# Patient Record
Sex: Female | Born: 1955 | Race: White | Hispanic: No | Marital: Married | Smoking: Former smoker
Health system: Southern US, Community
[De-identification: ages and names within clinical notes are randomized; demographics above are authoritative.]

## PROBLEM LIST (undated history)

## (undated) DIAGNOSIS — D689 Coagulation defect, unspecified: Secondary | ICD-10-CM

## (undated) DIAGNOSIS — C801 Malignant (primary) neoplasm, unspecified: Secondary | ICD-10-CM

## (undated) DIAGNOSIS — R570 Cardiogenic shock: Secondary | ICD-10-CM

## (undated) DIAGNOSIS — K219 Gastro-esophageal reflux disease without esophagitis: Secondary | ICD-10-CM

## (undated) DIAGNOSIS — I10 Essential (primary) hypertension: Secondary | ICD-10-CM

## (undated) DIAGNOSIS — J189 Pneumonia, unspecified organism: Secondary | ICD-10-CM

## (undated) DIAGNOSIS — Z8349 Family history of other endocrine, nutritional and metabolic diseases: Secondary | ICD-10-CM

## (undated) DIAGNOSIS — I2510 Atherosclerotic heart disease of native coronary artery without angina pectoris: Secondary | ICD-10-CM

## (undated) DIAGNOSIS — E785 Hyperlipidemia, unspecified: Secondary | ICD-10-CM

## (undated) DIAGNOSIS — R0600 Dyspnea, unspecified: Secondary | ICD-10-CM

## (undated) DIAGNOSIS — Z8674 Personal history of sudden cardiac arrest: Secondary | ICD-10-CM

## (undated) DIAGNOSIS — Z803 Family history of malignant neoplasm of breast: Secondary | ICD-10-CM

## (undated) DIAGNOSIS — I219 Acute myocardial infarction, unspecified: Secondary | ICD-10-CM

## (undated) HISTORY — DX: Family history of other endocrine, nutritional and metabolic diseases: Z83.49

## (undated) HISTORY — DX: Family history of malignant neoplasm of breast: Z80.3

## (undated) HISTORY — PX: OTHER SURGICAL HISTORY: SHX169

## (undated) HISTORY — DX: Personal history of sudden cardiac arrest: Z86.74

## (undated) HISTORY — DX: Acute myocardial infarction, unspecified: I21.9

## (undated) HISTORY — DX: Hyperlipidemia, unspecified: E78.5

## (undated) HISTORY — PX: EXPLORATORY LAPAROTOMY: SUR591

## (undated) HISTORY — DX: Coagulation defect, unspecified: D68.9

## (undated) HISTORY — DX: Atherosclerotic heart disease of native coronary artery without angina pectoris: I25.10

## (undated) HISTORY — DX: Essential (primary) hypertension: I10

## (undated) HISTORY — DX: Cardiogenic shock: R57.0

---

## 1978-11-30 HISTORY — PX: HERNIA REPAIR: SHX51

## 1985-11-30 HISTORY — PX: ABDOMINAL HYSTERECTOMY: SHX81

## 1985-11-30 HISTORY — PX: APPENDECTOMY: SHX54

## 1985-11-30 LAB — CONVERTED CEMR LAB

## 2003-09-27 ENCOUNTER — Ambulatory Visit (HOSPITAL_COMMUNITY): Admission: RE | Admit: 2003-09-27 | Discharge: 2003-09-27 | Payer: Self-pay | Admitting: Internal Medicine

## 2005-01-13 ENCOUNTER — Encounter: Admission: RE | Admit: 2005-01-13 | Discharge: 2005-01-13 | Payer: Self-pay | Admitting: Internal Medicine

## 2006-06-15 ENCOUNTER — Encounter: Admission: RE | Admit: 2006-06-15 | Discharge: 2006-06-15 | Payer: Self-pay | Admitting: Family Medicine

## 2006-06-24 ENCOUNTER — Encounter: Admission: RE | Admit: 2006-06-24 | Discharge: 2006-06-24 | Payer: Self-pay | Admitting: Family Medicine

## 2008-03-13 ENCOUNTER — Encounter: Admission: RE | Admit: 2008-03-13 | Discharge: 2008-03-13 | Payer: Self-pay | Admitting: Family Medicine

## 2009-01-16 ENCOUNTER — Encounter: Payer: Self-pay | Admitting: Family Medicine

## 2009-08-09 ENCOUNTER — Encounter: Admission: RE | Admit: 2009-08-09 | Discharge: 2009-08-09 | Payer: Self-pay | Admitting: Family Medicine

## 2009-11-30 DIAGNOSIS — I2510 Atherosclerotic heart disease of native coronary artery without angina pectoris: Secondary | ICD-10-CM

## 2009-11-30 HISTORY — DX: Atherosclerotic heart disease of native coronary artery without angina pectoris: I25.10

## 2010-01-28 DIAGNOSIS — R570 Cardiogenic shock: Secondary | ICD-10-CM

## 2010-01-28 HISTORY — DX: Cardiogenic shock: R57.0

## 2010-02-15 ENCOUNTER — Inpatient Hospital Stay (HOSPITAL_COMMUNITY): Admission: EM | Admit: 2010-02-15 | Discharge: 2010-03-04 | Payer: Self-pay | Admitting: Emergency Medicine

## 2010-02-15 ENCOUNTER — Ambulatory Visit: Payer: Self-pay | Admitting: Emergency Medicine

## 2010-02-15 ENCOUNTER — Ambulatory Visit: Payer: Self-pay | Admitting: Internal Medicine

## 2010-02-15 DIAGNOSIS — I219 Acute myocardial infarction, unspecified: Secondary | ICD-10-CM

## 2010-02-15 DIAGNOSIS — Z8674 Personal history of sudden cardiac arrest: Secondary | ICD-10-CM

## 2010-02-15 HISTORY — DX: Personal history of sudden cardiac arrest: Z86.74

## 2010-02-15 HISTORY — PX: CORONARY ANGIOPLASTY WITH STENT PLACEMENT: SHX49

## 2010-02-15 HISTORY — DX: Acute myocardial infarction, unspecified: I21.9

## 2010-02-16 ENCOUNTER — Encounter (INDEPENDENT_AMBULATORY_CARE_PROVIDER_SITE_OTHER): Payer: Self-pay | Admitting: Cardiovascular Disease

## 2010-02-27 ENCOUNTER — Ambulatory Visit: Payer: Self-pay | Admitting: Physical Medicine & Rehabilitation

## 2010-03-11 ENCOUNTER — Encounter: Admission: RE | Admit: 2010-03-11 | Discharge: 2010-03-14 | Payer: Self-pay | Admitting: Cardiovascular Disease

## 2010-03-20 ENCOUNTER — Encounter (HOSPITAL_COMMUNITY): Admission: RE | Admit: 2010-03-20 | Discharge: 2010-06-18 | Payer: Self-pay | Admitting: Cardiovascular Disease

## 2010-05-06 HISTORY — PX: DOPPLER ECHOCARDIOGRAPHY: SHX263

## 2010-05-06 HISTORY — PX: NM MYOCAR PERF WALL MOTION: HXRAD629

## 2010-06-19 ENCOUNTER — Ambulatory Visit: Payer: Self-pay | Admitting: Family Medicine

## 2010-06-19 DIAGNOSIS — E785 Hyperlipidemia, unspecified: Secondary | ICD-10-CM | POA: Insufficient documentation

## 2010-06-19 DIAGNOSIS — I10 Essential (primary) hypertension: Secondary | ICD-10-CM | POA: Insufficient documentation

## 2010-06-19 DIAGNOSIS — I252 Old myocardial infarction: Secondary | ICD-10-CM | POA: Insufficient documentation

## 2010-06-20 LAB — CONVERTED CEMR LAB
BUN: 16 mg/dL (ref 6–23)
Basophils Absolute: 0 10*3/uL (ref 0.0–0.1)
Basophils Relative: 0.5 % (ref 0.0–3.0)
CO2: 28 meq/L (ref 19–32)
Calcium: 9.7 mg/dL (ref 8.4–10.5)
Chloride: 108 meq/L (ref 96–112)
Creatinine, Ser: 0.7 mg/dL (ref 0.4–1.2)
Eosinophils Absolute: 0.1 10*3/uL (ref 0.0–0.7)
Eosinophils Relative: 2 % (ref 0.0–5.0)
GFR calc non Af Amer: 86.34 mL/min (ref >60)
Glucose, Bld: 81 mg/dL (ref 70–99)
HCT: 40.4 % (ref 36.0–46.0)
Hemoglobin: 13.9 g/dL (ref 12.0–15.0)
Lymphocytes Relative: 27.3 % (ref 12.0–46.0)
Lymphs Abs: 1.9 10*3/uL (ref 0.7–4.0)
MCHC: 34.4 g/dL (ref 30.0–36.0)
MCV: 91.1 fL (ref 78.0–100.0)
Monocytes Absolute: 0.7 10*3/uL (ref 0.1–1.0)
Monocytes Relative: 9.3 % (ref 3.0–12.0)
Neutro Abs: 4.3 10*3/uL (ref 1.4–7.7)
Neutrophils Relative %: 60.9 % (ref 43.0–77.0)
Platelets: 293 10*3/uL (ref 150.0–400.0)
Potassium: 4.5 meq/L (ref 3.5–5.1)
RBC: 4.44 M/uL (ref 3.87–5.11)
RDW: 14.4 % (ref 11.5–14.6)
Sodium: 142 meq/L (ref 135–145)
TSH: 1.52 microintl units/mL (ref 0.35–5.50)
WBC: 7 10*3/uL (ref 4.5–10.5)

## 2010-06-24 ENCOUNTER — Encounter: Payer: Self-pay | Admitting: Family Medicine

## 2010-07-03 ENCOUNTER — Encounter: Payer: Self-pay | Admitting: Family Medicine

## 2010-07-16 ENCOUNTER — Telehealth (INDEPENDENT_AMBULATORY_CARE_PROVIDER_SITE_OTHER): Payer: Self-pay | Admitting: *Deleted

## 2010-07-21 ENCOUNTER — Ambulatory Visit: Payer: Self-pay | Admitting: Family Medicine

## 2010-07-21 DIAGNOSIS — J0110 Acute frontal sinusitis, unspecified: Secondary | ICD-10-CM | POA: Insufficient documentation

## 2010-08-06 ENCOUNTER — Ambulatory Visit: Payer: Self-pay | Admitting: Family Medicine

## 2010-08-06 DIAGNOSIS — L723 Sebaceous cyst: Secondary | ICD-10-CM | POA: Insufficient documentation

## 2010-08-06 DIAGNOSIS — R002 Palpitations: Secondary | ICD-10-CM | POA: Insufficient documentation

## 2010-08-06 DIAGNOSIS — H6990 Unspecified Eustachian tube disorder, unspecified ear: Secondary | ICD-10-CM | POA: Insufficient documentation

## 2010-08-06 DIAGNOSIS — H6980 Other specified disorders of Eustachian tube, unspecified ear: Secondary | ICD-10-CM | POA: Insufficient documentation

## 2010-08-07 ENCOUNTER — Encounter: Payer: Self-pay | Admitting: Family Medicine

## 2010-08-11 ENCOUNTER — Encounter: Admission: RE | Admit: 2010-08-11 | Discharge: 2010-08-11 | Payer: Self-pay | Admitting: Family Medicine

## 2010-08-11 LAB — HM MAMMOGRAPHY: HM Mammogram: NEGATIVE

## 2010-09-16 ENCOUNTER — Encounter: Payer: Self-pay | Admitting: Family Medicine

## 2010-10-15 ENCOUNTER — Telehealth (INDEPENDENT_AMBULATORY_CARE_PROVIDER_SITE_OTHER): Payer: Self-pay | Admitting: *Deleted

## 2010-11-03 ENCOUNTER — Encounter (INDEPENDENT_AMBULATORY_CARE_PROVIDER_SITE_OTHER): Payer: Self-pay | Admitting: *Deleted

## 2010-11-03 LAB — CONVERTED CEMR LAB
ALT: 18 units/L
AST: 25 units/L
Albumin: 4.1 g/dL
Alkaline Phosphatase: 58 units/L
BUN: 15 mg/dL
CO2, serum: 26 mmol/L
Calcium: 9.2 mg/dL
Chloride, Serum: 107 mmol/L
Cholesterol: 179 mg/dL
Creatinine, Ser: 0.81 mg/dL
Glucose, Bld: 89 mg/dL
HDL: 52 mg/dL
LDL Cholesterol: 108 mg/dL
Potassium, serum: 4.3 mmol/L
Sodium, serum: 141 mmol/L
Total Bilirubin: 0.5 mg/dL
Total Protein: 6.5 g/dL
Triglycerides: 94 mg/dL

## 2010-11-27 ENCOUNTER — Encounter (INDEPENDENT_AMBULATORY_CARE_PROVIDER_SITE_OTHER): Payer: Self-pay | Admitting: *Deleted

## 2010-11-28 ENCOUNTER — Ambulatory Visit: Payer: Self-pay | Admitting: Family Medicine

## 2010-11-28 ENCOUNTER — Encounter: Payer: Self-pay | Admitting: Family Medicine

## 2010-11-28 LAB — CONVERTED CEMR LAB
Basophils Absolute: 0 10*3/uL (ref 0.0–0.1)
Basophils Relative: 0.4 % (ref 0.0–3.0)
Eosinophils Absolute: 0.1 10*3/uL (ref 0.0–0.7)
Eosinophils Relative: 2.4 % (ref 0.0–5.0)
HCT: 38.4 % (ref 36.0–46.0)
Hemoglobin: 13.1 g/dL (ref 12.0–15.0)
Lymphocytes Relative: 32.4 % (ref 12.0–46.0)
Lymphs Abs: 1.8 10*3/uL (ref 0.7–4.0)
MCHC: 34.2 g/dL (ref 30.0–36.0)
MCV: 92.5 fL (ref 78.0–100.0)
Monocytes Absolute: 0.5 10*3/uL (ref 0.1–1.0)
Monocytes Relative: 8.6 % (ref 3.0–12.0)
Neutro Abs: 3.2 10*3/uL (ref 1.4–7.7)
Neutrophils Relative %: 56.2 % (ref 43.0–77.0)
Platelets: 232 10*3/uL (ref 150.0–400.0)
RBC: 4.15 M/uL (ref 3.87–5.11)
RDW: 13.7 % (ref 11.5–14.6)
TSH: 1.04 microintl units/mL (ref 0.35–5.50)
WBC: 5.6 10*3/uL (ref 4.5–10.5)

## 2010-12-01 LAB — CONVERTED CEMR LAB: Vit D, 25-Hydroxy: 38 ng/mL (ref 30–89)

## 2010-12-21 ENCOUNTER — Encounter: Payer: Self-pay | Admitting: Family Medicine

## 2010-12-30 NOTE — Assessment & Plan Note (Signed)
Summary: FOLLOWUP/KN  Flu Vaccine Consent Questions     Do you have a history of severe allergic reactions to this vaccine? no    Any prior history of allergic reactions to egg and/or gelatin? no    Do you have a sensitivity to the preservative Thimersol? no    Do you have a past history of Guillan-Barre Syndrome? no    Do you currently have an acute febrile illness? no    Have you ever had a severe reaction to latex? no    Vaccine information given and explained to patient? yes    Are you currently pregnant? no    Lot Number:[id number]   Exp Date:05/29/2010   Site Given  Right Deltoid IM    Vital Signs:  Patient profile:   55 year old female Weight:      148 pounds Pulse rate:   64 / minute BP sitting:   110 / 60  (left arm)  Vitals Entered By: Doristine Devoid CMA (August 06, 2010 2:38 PM) CC: f/u    History of Present Illness: 55 yo woman here today for f/u.    1) diarrhea- had appt w/ Dr Evette Cristal.  he would not do colonoscopy due to pt's plavix use.  recommended pt have 1 year f/u w/ cards and get clearance to stop plavix for 2 days.  sxs have stopped since stopping amiodarone.  2) MI- Dr Allyson Sabal stopped pt's amiodarone due to GI intolerance.  3) palpitations- pt feels in the last 2 weeks heart is beating 'erratically'.  reports she had this prior to MI, sxs have recurred since stopping amiodarone.  asymptomatic today in office.  4) R ear pain- pain posterior to ear.  has persisted since mid August.  was tx'd for sinus infxn.  ear is improving but still uncomfortable.  no fevers.  no drainage.  5) sebaceous cyst- on L posterior neck.  not painful, not draining.  husband wants it removed.  Current Medications (verified): 1)  Klor-Con M20 20 Meq Cr-Tabs (Potassium Chloride Crys Cr) .Marland Kitchen.. 1 By Mouth Qd 2)  Lisinopril 10 Mg Tabs (Lisinopril) .Marland Kitchen.. 1 By Mouth Qd 3)  Metoprolol Tartrate 50 Mg Tabs (Metoprolol Tartrate) .Marland Kitchen.. 1 By Mouth Bid 4)  Pantoprazole Sodium 40 Mg Tbec  (Pantoprazole Sodium) .Marland Kitchen.. 1 By Mouth Bid 5)  Plavix 75 Mg Tabs (Clopidogrel Bisulfate) .Marland Kitchen.. 1 By Mouth Qd 6)  Aspirin 81 Mg Tbec (Aspirin) .... 2 By Mouth Qd 7)  Nitro-Dur 0.4 Mg/hr Pt24 (Nitroglycerin) .... Prn 8)  Ambien 10 Mg Tabs (Zolpidem Tartrate) .Marland Kitchen.. 1 By Mouth At Bedtime As Needed 9)  Nasonex 50 Mcg/act Susp (Mometasone Furoate) .... 2 Sprays Each Nostril Once Daily  Allergies (verified): No Known Drug Allergies  Past History:  Past Medical History: Last updated: 06/19/2010 Myocardial infarction, hx of (02-15-10) Hyperlipidemia Hypertension  Past Surgical History: Last updated: 06/19/2010 Appendectomy--1987 double bilateral herniorrhaphy--1980 Hysterectomy, BSO-- 1987 Laparotomy-exploratory  Review of Systems      See HPI  Physical Exam  General:  Well-developed,well-nourished,in no acute distress; alert,appropriate and cooperative throughout examination Head:  NCAT, no TTP over sinuses Eyes:  no injxn or inflammation Ears:  TMs retracted bilaterally Nose:  + congestion and turbinate edema Mouth:  + PND Neck:  No deformities, masses, or tenderness noted. Lungs:  Normal respiratory effort, chest expands symmetrically. Lungs are clear to auscultation, no crackles or wheezes. Heart:  Normal rate and regular rhythm. S1 and S2 normal without gallop, murmur, click, rub or other extra  sounds. Abdomen:  soft, NT/ND, +BS Pulses:  +2 carotid, radial, DP Extremities:  No clubbing, cyanosis, edema, or deformity noted Skin:  1.5 cm sebaceous cyst on R post neck   Impression & Recommendations:  Problem # 1:  DIARRHEA (ICD-787.91) Assessment Improved pt's sxs resolved since stopping amiodarone.  colonoscopy on hold due to pt's plavix use.  Problem # 2:  PALPITATIONS (ICD-785.1) Assessment: New no obvious abnormality on EKG- PVCs noted which may be responsible for pt's sxs.  encouraged her to discuss this w/ cards. Her updated medication list for this problem  includes:    Metoprolol Tartrate 50 Mg Tabs (Metoprolol tartrate) .Marland Kitchen... 1 by mouth bid  Problem # 3:  SEBACEOUS CYST, NECK (ICD-706.2) Assessment: New will hold off on removal due to pt's plavix use.  asymptomatic.  Problem # 4:  EUSTACHIAN TUBE DYSFUNCTION (ICD-381.81) Assessment: New pt's ear sxs most likely due to eustachian tube dysfxn.  start nasal steroid spray to decrease congestion.  odd OTC antihistamine.  reviewed supportive care and red flags that should prompt return.  Pt expresses understanding and is in agreement w/ this plan.  Complete Medication List: 1)  Klor-con M20 20 Meq Cr-tabs (Potassium chloride crys cr) .Marland Kitchen.. 1 by mouth qd 2)  Lisinopril 10 Mg Tabs (Lisinopril) .Marland Kitchen.. 1 by mouth qd 3)  Metoprolol Tartrate 50 Mg Tabs (Metoprolol tartrate) .Marland Kitchen.. 1 by mouth bid 4)  Pantoprazole Sodium 40 Mg Tbec (Pantoprazole sodium) .Marland Kitchen.. 1 by mouth bid 5)  Plavix 75 Mg Tabs (Clopidogrel bisulfate) .Marland Kitchen.. 1 by mouth qd 6)  Aspirin 81 Mg Tbec (Aspirin) .... 2 by mouth qd 7)  Nitro-dur 0.4 Mg/hr Pt24 (Nitroglycerin) .... Prn 8)  Ambien 10 Mg Tabs (Zolpidem tartrate) .Marland Kitchen.. 1 by mouth at bedtime as needed 9)  Nasonex 50 Mcg/act Susp (Mometasone furoate) .... 2 sprays each nostril once daily  Other Orders: EKG w/ Interpretation (93000) Admin 1st Vaccine (18841) Flu Vaccine 74yrs + (66063)  Patient Instructions: 1)  Follow up in 6 months for your complete physical- sooner if needed 2)  We'll hold on the sebaceous cyst until you've stopped or can hold the Plavix 3)  Start the Nasonex as directed to decrease nasal congestion and ear pain 4)  Add OTC Claritin or Zyrtec for seasonal allergy component 5)  Please call Dr Allyson Sabal and let him know about the erratic heart beat 6)  I'm so glad the diarrhea is gone! 7)  Hang in there! Prescriptions: NASONEX 50 MCG/ACT SUSP (MOMETASONE FUROATE) 2 sprays each nostril once daily  #1 x 3   Entered and Authorized by:   Neena Rhymes MD   Signed by:    Neena Rhymes MD on 08/06/2010   Method used:   Electronically to        [facility] [address] #[id number]* (retail)       [address]       [place], [place]  [address]       Ph: [phone]       Fax: [phone]   RxID:   [id number]

## 2010-12-30 NOTE — Letter (Signed)
Summary: [facility]  [facility]   Imported By: Lanelle Bal 08/18/2010 10:00:16  _____________________________________________________________________  External Attachment:    Type:   Image     Comment:   External Document

## 2010-12-30 NOTE — Assessment & Plan Note (Signed)
Summary: earache - cbs   Vital Signs:  Patient profile:   55 year old female Weight:      150 pounds Temp:     98.3 degrees F oral BP sitting:   128 / 88  (left arm)  Vitals Entered By: Almeta Monas CMA Duncan Dull) (July 21, 2010 4:20 PM) CC: c/o right ear pain and nasal drainage x 5 days, URI symptoms   History of Present Illness:       This is a 55 year old woman who presents with URI symptoms.  The symptoms began 5 days ago.  Symptoms started with ST on Thursday.  Pt states it then moved to sinuses and then to chest.  Pt went to UC--on 68.  The patient denies nasal congestion, clear nasal discharge, purulent nasal discharge, sore throat, dry cough, productive cough, earache, and sick contacts.  The patient denies fever, low-grade fever (<100.5 degrees), fever of 100.5-103 degrees, fever of 103.1-104 degrees, fever to >104 degrees, stiff neck, dyspnea, wheezing, rash, vomiting, diarrhea, use of an antipyretic, and response to antipyretic.  The patient also reports headache.  The patient denies itchy watery eyes, itchy throat, sneezing, seasonal symptoms, response to antihistamine, muscle aches, and severe fatigue.  The patient denies the following risk factors for Strep sinusitis: unilateral facial pain, unilateral nasal discharge, poor response to decongestant, double sickening, tooth pain, Strep exposure, tender adenopathy, and absence of cough.    Current Medications (verified): 1)  Amiodarone Hcl 200 Mg Tabs (Amiodarone Hcl) .Marland Kitchen.. 1 By Mouth Qd 2)  Klor-Con M20 20 Meq Cr-Tabs (Potassium Chloride Crys Cr) .Marland Kitchen.. 1 By Mouth Qd 3)  Lisinopril 10 Mg Tabs (Lisinopril) .Marland Kitchen.. 1 By Mouth Qd 4)  Metoprolol Tartrate 50 Mg Tabs (Metoprolol Tartrate) .Marland Kitchen.. 1 By Mouth Bid 5)  Pantoprazole Sodium 40 Mg Tbec (Pantoprazole Sodium) .Marland Kitchen.. 1 By Mouth Bid 6)  Plavix 75 Mg Tabs (Clopidogrel Bisulfate) .Marland Kitchen.. 1 By Mouth Qd 7)  Aspirin 81 Mg Tbec (Aspirin) .... 2 By Mouth Qd 8)  Nitro-Dur 0.4 Mg/hr Pt24  (Nitroglycerin) .... Prn 9)  Crestor 5mg  .... 1 By Mouth Every Other Day 10)  Ceftin 500 Mg Tabs (Cefuroxime Axetil) .Marland Kitchen.. 1 By Mouth Two Times A Day 11)  Prednisone 10 Mg Tabs (Prednisone) .... 3 By Mouth Once Daily For 3 Days Then 2 By Mouth Once Daily For 3 Days Then 1 By Mouth Once Daily For 3 Days 12)  Ambien 10 Mg Tabs (Zolpidem Tartrate) .Marland Kitchen.. 1 By Mouth At Bedtime As Needed  Allergies (verified): No Known Drug Allergies  Past History:  Past medical, surgical, family and social histories (including risk factors) reviewed for relevance to current acute and chronic problems.  Past Medical History: Reviewed history from 06/19/2010 and no changes required. Myocardial infarction, hx of (02-15-10) Hyperlipidemia Hypertension  Past Surgical History: Reviewed history from 06/19/2010 and no changes required. Appendectomy--1987 double bilateral herniorrhaphy--1980 Hysterectomy, BSO-- 1987 Laparotomy-exploratory  Family History: Reviewed history and no changes required.  Social History: Reviewed history from 06/19/2010 and no changes required. 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  Otoscopic examination reveals clear canals, tympanic membranes are intact bilaterally without bulging, retraction, inflammation or discharge. Hearing is grossly normal bilaterally. Nose:  L  frontal sinus tenderness, L maxillary sinus tenderness, R frontal sinus tenderness, and R maxillary sinus tenderness.   Mouth:  Oral mucosa and oropharynx without lesions or exudates.  Teeth in good repair. Neck:  No deformities, masses, or tenderness noted. Lungs:  R wheezes and L wheezes.   Heart:   Normal rate and regular rhythm. S1 and S2 normal without gallop, murmur, click, rub or other extra sounds. Extremities:  No clubbing, cyanosis, edema, or deformity noted with normal full range of motion of all joints.   Skin:  Intact without suspicious lesions or rashes Cervical Nodes:  No lymphadenopathy noted Psych:  Cognition and judgment appear intact. Alert and cooperative with normal attention span and concentration. No apparent delusions, illusions, hallucinations   Impression & Recommendations:  Problem # 1:  SINUSITIS - ACUTE-NOS (ICD-461.9)  Her updated medication list for this problem includes:    Ceftin 500 Mg Tabs (Cefuroxime axetil) .Marland Kitchen... 1 by mouth two times a day  Orders: Depo- Medrol 80mg  (J1040) Admin of Therapeutic Inj  intramuscular or subcutaneous ([address]109)  Instructed on treatment. Call if symptoms persist or worsen.   Problem # 2:  BRONCHITIS- ACUTE (ICD-466.0)  Her updated medication list for this problem includes:    Ceftin 500 Mg Tabs (Cefuroxime axetil) .Marland Kitchen... 1 by mouth two times a day  Take antibiotics and other medications as directed. Encouraged to push clear liquids, get enough rest, and take acetaminophen as needed. To be seen in 5-7 days if no improvement, sooner if worse.  Complete Medication List: 1)  Amiodarone Hcl 200 Mg Tabs (Amiodarone hcl) .Marland Kitchen.. 1 by mouth qd 2)  Klor-con M20 20 Meq Cr-tabs (Potassium chloride crys cr) .Marland Kitchen.. 1 by mouth qd 3)  Lisinopril 10 Mg Tabs (Lisinopril) .Marland Kitchen.. 1 by mouth qd 4)  Metoprolol Tartrate 50 Mg Tabs (Metoprolol tartrate) .Marland Kitchen.. 1 by mouth bid 5)  Pantoprazole Sodium 40 Mg Tbec (Pantoprazole sodium) .Marland Kitchen.. 1 by mouth bid 6)  Plavix 75 Mg Tabs (Clopidogrel bisulfate) .Marland Kitchen.. 1 by mouth qd 7)  Aspirin 81 Mg Tbec (Aspirin) .... 2 by mouth qd 8)  Nitro-dur 0.4 Mg/hr Pt24 (Nitroglycerin) .... Prn 9)  Crestor 5mg   .... 1 by mouth every other day 10)  Ceftin 500 Mg Tabs (Cefuroxime axetil) .Marland Kitchen.. 1 by mouth two times a day 11)   Prednisone 10 Mg Tabs (Prednisone) .... 3 by mouth once daily for 3 days then 2 by mouth once daily for 3 days then 1 by mouth once daily for 3 days 12)  Ambien 10 Mg Tabs (Zolpidem tartrate) .Marland Kitchen.. 1 by mouth at bedtime as needed Prescriptions: AMBIEN 10 MG TABS (ZOLPIDEM TARTRATE) 1 by mouth at bedtime as needed  #30 x 0   Entered and Authorized by:   Loreen Freud DO   Signed by:   Loreen Freud DO on 07/21/2010   Method used:   Print then Give to Patient   RxID:   [id number] PREDNISONE 10 MG TABS (PREDNISONE) 3 by mouth once daily for 3 days then 2 by mouth once daily for 3 days then 1 by mouth once daily for 3 days  #18 x 0   Entered and Authorized by:   Loreen Freud DO   Signed by:   Loreen Freud DO on 07/21/2010   Method used:   Print then Give to Patient   RxID:   [id number] CEFTIN 500 MG TABS (CEFUROXIME AXETIL) 1 by mouth two times a day  #20 x 0   Entered and Authorized by:  Loreen Freud DO   Signed by:   Loreen Freud DO on 07/21/2010   Method used:   Electronically to        [facility] #[id number]* (retail)       [address]       [place], [place]  [address]       Ph: [phone]       Fax: [phone]   RxID:   [id number]    Medication Administration  Injection # 1:    Medication: Depo- Medrol 80mg     Diagnosis: SINUSITIS - ACUTE-NOS (ICD-461.9)    Route: IM    Site: RUOQ gluteus    Exp Date: 03/31/2011    Lot #: [id number]    Mfr: Pharm[employer]  Orders Added: 1)  Depo- Medrol 80mg  [J1040] 2)  Admin of Therapeutic Inj  intramuscular or subcutaneous [96372] 3)  Est. Patient Level III [44010]

## 2010-12-30 NOTE — Letter (Signed)
Summary: [facility]  [facility]   Imported By: Lanelle Bal 09/30/2010 11:11:54  _____________________________________________________________________  External Attachment:    Type:   Image     Comment:   External Document

## 2010-12-30 NOTE — Assessment & Plan Note (Signed)
Summary: new to est//kn   Vital Signs:  Patient profile:   55 year old female Height:      67.5 inches (194.31 cm) Weight:      144.13 pounds (65.51 kg) BMI:     22.32 Temp:     98.3 degrees F (36.83 degrees C) oral BP sitting:   130 / 80  (left arm) Cuff size:   regular  Vitals Entered By: Lucious Groves CMA (June 19, 2010 10:41 AM)  History of Present Illness: 55 yo woman here today to establish care.  Previous PCP- Maryelizabeth Rowan.  Cards- Dr Allyson Sabal.  1) Sudden Cardiac Death04-03-2023 had MI at home.  husband did CPR and was shocked multiple times in the field by EMS.  had emergent cath for MI upon arrival at [facility].  In cardiac rehab 3x/week.  2) HTN- BP is low in cardiac rehab.  maintained on Metoprolol and Lisinopril.  3) Hyperlipidemia- was taken off her statin due to severe myalgias.  has f/u w/ Cards next week to re-visit this issue.  4) Health Maintainence- due for Mammogram in August, has never had colonoscopy.  5) abd pain- 'constant diarrhea' starting 2 yrs ago, had stool studies done multiple times.  improved w/ probiotics but didn't resolve.  no abd issues while hospitalized or when first d/c'd.  now again having diarrhea.  no blood in stool, no abd cramping.  stools are water.  occuring independently of eating, occuring every 25-35 minutes.  sxs initially started after 3 rounds of abx.  no nausea, vomiting.  Preventive Screening-Counseling & Management  Alcohol-Tobacco     Alcohol drinks/day: <1     Smoking Status: quit     Year Quit: 2011  Caffeine-Diet-Exercise     Does Patient Exercise: yes     Type of exercise: cardiac rehab, walking, bike      Sexual History:  currently monogamous.        Drug Use:  never and no.    Current Medications (verified): 1)  Amiodarone Hcl 200 Mg Tabs (Amiodarone Hcl) .Marland Kitchen.. 1 By Mouth Qd 2)  Klor-Con M20 20 Meq Cr-Tabs (Potassium Chloride Crys Cr) .Marland Kitchen.. 1 By Mouth Qd 3)  Lisinopril 10 Mg Tabs (Lisinopril) .Marland Kitchen.. 1 By Mouth Qd 4)   Metoprolol Tartrate 50 Mg Tabs (Metoprolol Tartrate) .Marland Kitchen.. 1 By Mouth Bid 5)  Pantoprazole Sodium 40 Mg Tbec (Pantoprazole Sodium) .Marland Kitchen.. 1 By Mouth Bid 6)  Plavix 75 Mg Tabs (Clopidogrel Bisulfate) .Marland Kitchen.. 1 By Mouth Qd 7)  Aspirin 81 Mg Tbec (Aspirin) .... 2 By Mouth Qd 8)  Nitro-Dur 0.4 Mg/hr Pt24 (Nitroglycerin) .... 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 Lungs:  Normal respiratory effort, chest expands symmetrically. Lungs are clear to auscultation, no crackles or wheezes. Heart:  Normal rate and regular rhythm. S1 and S2 normal without gallop, murmur, click, rub or other extra sounds. Abdomen:  soft, NT/ND, +  BS, no rebound/guarding Pulses:  +2 carotid, radial, DP Extremities:  no C/C/E Neurologic:  alert & oriented X3, cranial nerves II-XII intact, strength normal in all extremities, gait normal, and DTRs symmetrical and normal.   Skin:  Intact without suspicious lesions or rashes Cervical Nodes:  No lymphadenopathy noted Psych:  Cognition and judgment appear intact. Alert and cooperative with normal attention span and concentration. No apparent delusions, illusions, hallucinations   Impression & Recommendations:  Problem # 1:  MYOCARDIAL  INFARCTION, HX OF (ICD-412) Assessment New pt following w/ Dr Allyson Sabal at [facility] cards.  continue cardiac rehab. Her updated medication list for this problem includes:    Amiodarone Hcl 200 Mg Tabs (Amiodarone hcl) .Marland Kitchen... 1 by mouth qd    Lisinopril 10 Mg Tabs (Lisinopril) .Marland Kitchen... 1 by mouth qd    Metoprolol Tartrate 50 Mg Tabs (Metoprolol tartrate) .Marland Kitchen... 1 by mouth bid    Plavix 75 Mg Tabs (Clopidogrel bisulfate) .Marland Kitchen... 1 by mouth qd    Aspirin 81 Mg Tbec (Aspirin) .Marland Kitchen... 2 by mouth qd    Nitro-dur 0.4 Mg/hr Pt24 (Nitroglycerin) .Marland Kitchen... Prn  Problem # 2:  HYPERTENSION (ICD-401.9) Assessment: New has hypotension at cardiac rehab.  currently on ACE and beta blocker as recommended for post-MI pts.  cards to manage. Her updated medication list for this problem includes:    Lisinopril 10 Mg Tabs (Lisinopril) .Marland Kitchen... 1 by mouth qd    Metoprolol Tartrate 50 Mg Tabs (Metoprolol tartrate) .Marland Kitchen... 1 by mouth bid  Problem # 3:  HYPERLIPIDEMIA (ICD-272.4) Assessment: New not currently on statin due to severe myalgias.  cards managing.  Problem # 4:  DIARRHEA (ICD-787.91) Assessment: New sxs started 2+ yrs ago.  has never had GI w/u.  will refer.  check labs to r/o dehydration, electrolyte abnormality, or thyroid abnormality as cause of pt's sxs.  will follow closely. Orders: Venipuncture (03474) Specimen Handling (25956) Gastroenterology Referral (GI) TLB-BMP (Basic Metabolic Panel-BMET) (80048-METABOL) TLB-CBC Platelet - w/Differential (85025-CBCD) TLB-TSH (Thyroid Stimulating Hormone) (84443-TSH)  Complete Medication List: 1)  Amiodarone Hcl 200 Mg Tabs (Amiodarone hcl) .Marland Kitchen.. 1 by mouth qd 2)  Klor-con M20 20 Meq Cr-tabs (Potassium chloride crys cr) .Marland Kitchen.. 1 by mouth qd 3)  Lisinopril 10 Mg Tabs (Lisinopril) .Marland Kitchen.. 1 by mouth qd 4)  Metoprolol Tartrate 50 Mg Tabs (Metoprolol tartrate) .Marland Kitchen.. 1 by mouth bid 5)  Pantoprazole Sodium 40 Mg Tbec (Pantoprazole sodium) .Marland Kitchen.. 1 by mouth bid 6)  Plavix 75 Mg Tabs  (Clopidogrel bisulfate) .Marland Kitchen.. 1 by mouth qd 7)  Aspirin 81 Mg Tbec (Aspirin) .... 2 by mouth qd 8)  Nitro-dur 0.4 Mg/hr Pt24 (Nitroglycerin) .... Prn  Patient Instructions: 1)  Please schedule a follow up in with me in 4-6 weeks 2)  We'll notify you of your lab results 3)  Someone will call you with your GI appt 4)  Call with any questions or concerns 5)  Welcome!  We're glad to have you!  Preventive Care Screening  Mammogram:    Date:  06/30/2009    Results:  normal   Pap Smear:    Date:  11/30/1985    Results:  historical     Immunization History:  Influenza Immunization History:    Influenza:  historical (09/30/2009)

## 2010-12-30 NOTE — Progress Notes (Signed)
Summary: would like to change rx   Phone Note Call from Patient Call back at [phone]   Caller: Patient Summary of Call: patient left msg on voicemail unable to tolerate ambien would like to have prescription for alprazolam instead says it works better  [facility] on [address]. Initial call taken by: Doristine Devoid CMA,  October 15, 2010 5:03 PM  Follow-up for Phone Call        spoke w/ patient say that Ambien make her "crazy" unable to get any sleep was given alprazolam when she was in hospital some time ago and it work well at getting her calm down to go to sleep doesn't remember dose but says it was a low dose.......Marland KitchenDoristine Devoid CMA  October 16, 2010 10:26 AM   Additional Follow-up for Phone Call Additional follow up Details #1::        ok for Alprazolam 0.5mg  nightly as needed. Additional Follow-up by: Neena Rhymes MD,  October 16, 2010 10:37 AM    Additional Follow-up for Phone Call Additional follow up Details #2::    spoke w/ patient aware prescription sent to pharmacy............Marland KitchenDoristine Devoid CMA  October 16, 2010 10:42 AM   New/Updated Medications: ALPRAZOLAM 0.5 MG  TABS (ALPRAZOLAM) 1 tab nightly as needed for sleep. Prescriptions: ALPRAZOLAM 0.5 MG  TABS (ALPRAZOLAM) 1 tab nightly as needed for sleep.  #30 x 0   Entered and Authorized by:   Neena Rhymes MD   Signed by:   Neena Rhymes MD on 10/16/2010   Method used:   Printed then faxed to ...       [facility] [address] #[id number]* (retail)       [address]       [place], [place]  [address]       Ph: [phone]       Fax: [phone]   RxID:   [id number]

## 2010-12-30 NOTE — Letter (Signed)
Summary: [facility]  [facility]   Imported By: Lanelle Bal 07/02/2010 14:13:13  _____________________________________________________________________  External Attachment:    Type:   Image     Comment:   External Document

## 2010-12-30 NOTE — Consult Note (Signed)
Summary: [facility]  [facility]   Imported By: Lanelle Bal 08/18/2010 10:01:20  _____________________________________________________________________  External Attachment:    Type:   Image     Comment:   External Document

## 2010-12-30 NOTE — Progress Notes (Signed)
Summary: Sinus Infection   Out of Town  Phone Note Call from Patient Call back at [phone]   Caller: Patient Summary of Call: Patient called and LM on triage VM stating [place] and woke up this morning with a sore throat and syptoms of a sinus infection. She would like something called in for her. Please advise.  Initial call taken by: Harold Barban,  July 16, 2010 3:35 PM  Follow-up for Phone Call        do not call in abx w/out seeing pt.  pt can be seen at [facility] if she is out of town but if sxs just started this AM it may be a viral illness that doesn't require abx. Follow-up by: Neena Rhymes MD,  July 16, 2010 3:37 PM  Additional Follow-up for Phone Call Additional follow up Details #1::        Patient is aware and will go get something OTC. Additional Follow-up by: Harold Barban,  July 16, 2010 3:41 PM

## 2011-01-01 NOTE — Miscellaneous (Signed)
  Clinical Lists Changes  Observations: Added new observation of TRIGLYC TOT: 94 mg/dL (16/08/9603 54:09) Added new observation of LDL: 108 mg/dL (81/19/1478 29:56) Added new observation of HDL: 52 mg/dL (21/30/8657 84:69) Added new observation of CHOLESTEROL: 179 mg/dL (62/95/2841 32:44) Added new observation of BILI TOTAL: 0.5 mg/dL (12/02/7251 66:44) Added new observation of ALK PHOS: 58 units/L (11/03/2010 10:40) Added new observation of SGPT (ALT): 18 units/L (11/03/2010 10:40) Added new observation of SGOT (AST): 25 units/L (11/03/2010 10:40) Added new observation of PROTEIN, TOT: 6.5 g/dL (03/47/4259 56:38) Added new observation of ALBUMIN: 4.1 g/dL (75/64/3329 51:88) Added new observation of CALCIUM: 9.2 mg/dL (41/66/0630 16:01) Added new observation of GLUCOSE SER: 89 mg/dL (09/32/3557 32:20) Added new observation of CREATININE: 0.81 mg/dL (25/42/7062 37:62) Added new observation of BUN: 15 mg/dL (83/15/1761 60:73) Added new observation of CO2 TOTAL: 26 mmol/L (11/03/2010 10:40) Added new observation of CHLORIDE: 107 mmol/L (11/03/2010 10:40) Added new observation of POTASSIUM: 4.3 mmol/L (11/03/2010 10:40) Added new observation of SODIUM: 141 mmol/L (11/03/2010 10:40)

## 2011-01-01 NOTE — Assessment & Plan Note (Signed)
Summary: CPX//PH   Vital Signs:  Patient profile:   55 year old female Height:      67 inches Weight:      147 pounds BMI:     23.11 Temp:     97.6 degrees F oral Pulse rate:   62 / minute Resp:     18 per minute BP sitting:   110 / 78  (left arm)  Vitals Entered By: Jeremy Johann CMA (November 28, 2010 8:05 AM) CC: cpx, fasting, ?pap   History of Present Illness: 55 yo woman here today for CPE.  no concerns today.  UTD on mammogram.  holding on colonoscopy due to plavix use.  Preventive Screening-Counseling & Management  Alcohol-Tobacco     Alcohol drinks/day: <1     Smoking Status: quit  Caffeine-Diet-Exercise     Does Patient Exercise: yes     Type of exercise: bike      Sexual History:  currently monogamous.        Drug Use:  never.    Current Medications (verified): 1)  Klor-Con M20 20 Meq Cr-Tabs (Potassium Chloride Crys Cr) .Marland Kitchen.. 1 By Mouth Qd 2)  Lisinopril 10 Mg Tabs (Lisinopril) .Marland Kitchen.. 1 By Mouth Qd 3)  Metoprolol Tartrate 50 Mg Tabs (Metoprolol Tartrate) .Marland Kitchen.. 1 By Mouth Bid 4)  Pantoprazole Sodium 40 Mg Tbec (Pantoprazole Sodium) .Marland Kitchen.. 1 By Mouth Bid 5)  Plavix 75 Mg Tabs (Clopidogrel Bisulfate) .Marland Kitchen.. 1 By Mouth Qd 6)  Aspirin 81 Mg Tbec (Aspirin) .... 2 By Mouth Qd 7)  Nitro-Dur 0.4 Mg/hr Pt24 (Nitroglycerin) .... Prn 8)  Alprazolam 0.5 Mg  Tabs (Alprazolam) .Marland Kitchen.. 1 Tab Nightly As Needed For Sleep. 9)  Nasonex 50 Mcg/act Susp (Mometasone Furoate) .... 2 Sprays Each Nostril Once Daily 10)  Crestor 5 Mg Tabs (Rosuvastatin Calcium) .... Take 1 Tab Once Daily 11)  Triamcinolone Acetonide 0.1 % Oint (Triamcinolone Acetonide) .... Apply To Affected Area Two Times A Day.  Disp 1 Large Tube  Allergies (verified): No Known Drug Allergies  Past History:  Past medical, surgical, family and social histories (including risk factors) reviewed, and no changes noted (except as noted below).  Past Medical History: Reviewed history from 06/19/2010 and no changes  required. Myocardial infarction, hx of (02-15-10) Hyperlipidemia Hypertension  Past Surgical History: Reviewed history from 06/19/2010 and no changes required. Appendectomy--1987 double bilateral herniorrhaphy--1980 Hysterectomy, BSO-- 1987 Laparotomy-exploratory  Family History: Reviewed history and no changes required. colon cancer- none breast cancer- mother, dx'd at age 53  Social History: Reviewed history from 06/19/2010 and no changes required. Occupation: Print production planner at [employer] Married Former Smoker, quit 01/2010 Alcohol use-yes Drug use-no 2 children Drug Use:  never  Review of Systems  The patient denies anorexia, fever, weight loss, weight gain, vision loss, decreased hearing, hoarseness, chest pain, syncope, dyspnea on exertion, peripheral edema, prolonged cough, headaches, abdominal pain, melena, hematochezia, severe indigestion/heartburn, hematuria, suspicious skin lesions, depression, abnormal bleeding, enlarged lymph nodes, and breast masses.    Physical Exam  General:  Well-developed,well-nourished,in no acute distress; alert,appropriate and cooperative throughout examination Head:  Normocephalic and atraumatic without obvious abnormalities. No apparent alopecia or balding. Eyes:  No corneal or conjunctival inflammation noted. EOMI. Perrla. Funduscopic exam benign, without hemorrhages, exudates or papilledema. Vision grossly normal. Ears:  External ear exam shows no significant lesions or deformities.  Otoscopic examination reveals clear canals, tympanic membranes are intact bilaterally without bulging, retraction, inflammation or discharge. Hearing is grossly normal bilaterally. Nose:  External nasal examination  shows no deformity or inflammation. Nasal mucosa are pink and moist without lesions or exudates. Mouth:  Oral mucosa and oropharynx without lesions or exudates.  Teeth in good repair. Neck:  No deformities, masses, or tenderness noted. Breasts:   No mass, nodules, thickening, tenderness, bulging, retraction, inflamation, nipple discharge or skin changes noted.   Lungs:  Normal respiratory effort, chest expands symmetrically. Lungs are clear to auscultation, no crackles or wheezes. Heart:  Normal rate and regular rhythm. S1 and S2 normal without gallop, murmur, click, rub or other extra sounds. Abdomen:  Bowel sounds positive,abdomen soft and non-tender without masses, organomegaly or hernias noted. Pulses:  +2 carotid, radial, DP/PT Extremities:  No clubbing, cyanosis, edema, or deformity noted Neurologic:  No cranial nerve deficits noted. Station and gait are normal. Plantar reflexes are down-going bilaterally. DTRs are symmetrical throughout. Sensory, motor and coordinative functions appear intact. Skin:  1.5 cm sebaceous cyst on R post neck Cervical Nodes:  No lymphadenopathy noted Axillary Nodes:  No palpable lymphadenopathy Psych:  Cognition and judgment appear intact. Alert and cooperative with normal attention span and concentration. No apparent delusions, illusions, hallucinations   Impression & Recommendations:  Problem # 1:  PHYSICAL EXAMINATION (ICD-V70.0) Assessment New pt's PE WNL.  had recent lipids and CMP done by cards- reviewed w/ pt.  check remaining labs.  no need for pap due to TAH-BSO.  anticipatory guidance provided. Orders: Venipuncture (32355) TLB-CBC Platelet - w/Differential (85025-CBCD) TLB-TSH (Thyroid Stimulating Hormone) (84443-TSH) T-Vitamin D (25-Hydroxy) (73220-25427)  Complete Medication List: 1)  Klor-con M20 20 Meq Cr-tabs (Potassium chloride crys cr) .Marland Kitchen.. 1 by mouth qd 2)  Lisinopril 10 Mg Tabs (Lisinopril) .Marland Kitchen.. 1 by mouth qd 3)  Metoprolol Tartrate 50 Mg Tabs (Metoprolol tartrate) .Marland Kitchen.. 1 by mouth bid 4)  Pantoprazole Sodium 40 Mg Tbec (Pantoprazole sodium) .Marland Kitchen.. 1 by mouth bid 5)  Plavix 75 Mg Tabs (Clopidogrel bisulfate) .Marland Kitchen.. 1 by mouth qd 6)  Aspirin 81 Mg Tbec (Aspirin) .... 2 by mouth  qd 7)  Nitro-dur 0.4 Mg/hr Pt24 (Nitroglycerin) .... Prn 8)  Alprazolam 0.5 Mg Tabs (Alprazolam) .Marland Kitchen.. 1 tab nightly as needed for sleep. 9)  Nasonex 50 Mcg/act Susp (Mometasone furoate) .... 2 sprays each nostril once daily 10)  Crestor 5 Mg Tabs (Rosuvastatin calcium) .... Take 1 tab once daily 11)  Triamcinolone Acetonide 0.1 % Oint (Triamcinolone acetonide) .... Apply to affected area two times a day.  disp 1 large tube  Patient Instructions: 1)  Your exam looks great!  Keep up the good work! 2)  Follow up in 6 months to touch base on things 3)  Apply the steroid cream to the dry areas two times a day as needed 4)  We'll notify you of your lab results 5)  Call with any questions or concerns 6)  Happy New Year!!! Prescriptions: ALPRAZOLAM 0.5 MG  TABS (ALPRAZOLAM) 1 tab nightly as needed for sleep.  #30 x 6   Entered and Authorized by:   Neena Rhymes MD   Signed by:   Neena Rhymes MD on 11/28/2010   Method used:   Print then Give to Patient   RxID:   [id number] TRIAMCINOLONE ACETONIDE 0.1 % OINT (TRIAMCINOLONE ACETONIDE) apply to affected area two times a day.  disp 1 large tube  #1 x 3   Entered and Authorized by:   Neena Rhymes MD   Signed by:   Neena Rhymes MD on 11/28/2010   Method used:   Electronically to  [facility] [id number] (retail)       [address]       [place], [place]  [address]       Ph: [phone]       Fax: [phone]   RxID:   [id number]    Orders Added: 1)  Venipuncture [44010] 2)  TLB-CBC Platelet - w/Differential [85025-CBCD] 3)  TLB-TSH (Thyroid Stimulating Hormone) [84443-TSH] 4)  T-Vitamin D (25-Hydroxy) [27253-66440] 5)  Est. Patient 40-64 years [id number]

## 2011-02-12 ENCOUNTER — Telehealth: Payer: Self-pay | Admitting: Family Medicine

## 2011-02-17 NOTE — Progress Notes (Signed)
Summary: PERSONAL ISSUE CALL BACK  Phone Note Call from Patient Call back at [phone]   Caller: Patient Summary of Call: Pt left VM that she would like for Dr Beverely Low to give her a call back to discuss personal matter. 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 Advise Pt if it is a medical concern she would need OV Pt states that it is a question about a treatment that has been on going for years. No other info given, Pt would like a call back from dr Japneet Staggs to discuss further..........Marland KitchenFelecia Deloach CMA  February 12, 2011 8:56 AM   Follow-up for Phone Call        pt called indicating that she would like a script for a new mattress.  we had discussed multiple times that her poor sleep may have played a role in her heart attack and she has since taken multiple sleep meds (Ambien, Alprazolam).  bought a new mattress to improve her sleeping habits.  told her that I will write a script and fax to her b/c I firmly believe that improving the quality of her sleep has helped improve her overall health.  will post date script to 07/21/10 which is when pt was first given Ambien by this office. Follow-up by: Neena Rhymes MD,  February 12, 2011 9:19 AM    Prescriptions: NEW MATTRESS SET use to improve overall quality of sleep and subsequently total health  #1 x 0   Entered and Authorized by:   Neena Rhymes MD   Signed by:   Neena Rhymes MD on 02/12/2011   Method used:   Print then Give to Patient   RxID:   [id number]

## 2011-02-18 LAB — CBC
HCT: 28.9 % — ABNORMAL LOW (ref 36.0–46.0)
HCT: 29.3 % — ABNORMAL LOW (ref 36.0–46.0)
HCT: 30.2 % — ABNORMAL LOW (ref 36.0–46.0)
HCT: 30.4 % — ABNORMAL LOW (ref 36.0–46.0)
HCT: 31.5 % — ABNORMAL LOW (ref 36.0–46.0)
Hemoglobin: 10.2 g/dL — ABNORMAL LOW (ref 12.0–15.0)
Hemoglobin: 10.4 g/dL — ABNORMAL LOW (ref 12.0–15.0)
Hemoglobin: 10.7 g/dL — ABNORMAL LOW (ref 12.0–15.0)
Hemoglobin: 9.7 g/dL — ABNORMAL LOW (ref 12.0–15.0)
Hemoglobin: 9.8 g/dL — ABNORMAL LOW (ref 12.0–15.0)
MCHC: 33.2 g/dL (ref 30.0–36.0)
MCHC: 33.7 g/dL (ref 30.0–36.0)
MCHC: 33.9 g/dL (ref 30.0–36.0)
MCHC: 34.1 g/dL (ref 30.0–36.0)
MCHC: 34.1 g/dL (ref 30.0–36.0)
MCV: 91 fL (ref 78.0–100.0)
MCV: 91.3 fL (ref 78.0–100.0)
MCV: 91.7 fL (ref 78.0–100.0)
MCV: 91.8 fL (ref 78.0–100.0)
MCV: 92.3 fL (ref 78.0–100.0)
Platelets: 747 10*3/uL — ABNORMAL HIGH (ref 150–400)
Platelets: 751 10*3/uL — ABNORMAL HIGH (ref 150–400)
Platelets: 786 10*3/uL — ABNORMAL HIGH (ref 150–400)
Platelets: 787 10*3/uL — ABNORMAL HIGH (ref 150–400)
Platelets: 804 10*3/uL — ABNORMAL HIGH (ref 150–400)
RBC: 3.18 MIL/uL — ABNORMAL LOW (ref 3.87–5.11)
RBC: 3.19 MIL/uL — ABNORMAL LOW (ref 3.87–5.11)
RBC: 3.27 MIL/uL — ABNORMAL LOW (ref 3.87–5.11)
RBC: 3.31 MIL/uL — ABNORMAL LOW (ref 3.87–5.11)
RBC: 3.45 MIL/uL — ABNORMAL LOW (ref 3.87–5.11)
RDW: 15.1 % (ref 11.5–15.5)
RDW: 15.4 % (ref 11.5–15.5)
RDW: 15.4 % (ref 11.5–15.5)
RDW: 15.9 % — ABNORMAL HIGH (ref 11.5–15.5)
RDW: 16 % — ABNORMAL HIGH (ref 11.5–15.5)
WBC: 10.7 10*3/uL — ABNORMAL HIGH (ref 4.0–10.5)
WBC: 13.5 10*3/uL — ABNORMAL HIGH (ref 4.0–10.5)
WBC: 6.1 10*3/uL (ref 4.0–10.5)
WBC: 6.8 10*3/uL (ref 4.0–10.5)
WBC: 7.9 10*3/uL (ref 4.0–10.5)

## 2011-02-18 LAB — COMPREHENSIVE METABOLIC PANEL
ALT: 85 U/L — ABNORMAL HIGH (ref 0–35)
AST: 59 U/L — ABNORMAL HIGH (ref 0–37)
Albumin: 2.9 g/dL — ABNORMAL LOW (ref 3.5–5.2)
Alkaline Phosphatase: 241 U/L — ABNORMAL HIGH (ref 39–117)
BUN: 13 mg/dL (ref 6–23)
CO2: 26 mEq/L (ref 19–32)
Calcium: 8.9 mg/dL (ref 8.4–10.5)
Chloride: 105 mEq/L (ref 96–112)
Creatinine, Ser: 0.75 mg/dL (ref 0.4–1.2)
GFR calc Af Amer: >60 mL/min (ref >60)
GFR calc non Af Amer: >60 mL/min (ref >60)
Glucose, Bld: 100 mg/dL — ABNORMAL HIGH (ref 70–99)
Potassium: 3.8 mEq/L (ref 3.5–5.1)
Sodium: 138 mEq/L (ref 135–145)
Total Bilirubin: 1.1 mg/dL (ref 0.3–1.2)
Total Protein: 7.1 g/dL (ref 6.0–8.3)

## 2011-02-18 LAB — BASIC METABOLIC PANEL
BUN: 10 mg/dL (ref 6–23)
BUN: 8 mg/dL (ref 6–23)
CO2: 24 mEq/L (ref 19–32)
CO2: 26 mEq/L (ref 19–32)
Calcium: 8.7 mg/dL (ref 8.4–10.5)
Calcium: 8.7 mg/dL (ref 8.4–10.5)
Chloride: 106 mEq/L (ref 96–112)
Chloride: 106 mEq/L (ref 96–112)
Creatinine, Ser: 0.69 mg/dL (ref 0.4–1.2)
Creatinine, Ser: 0.73 mg/dL (ref 0.4–1.2)
GFR calc Af Amer: >60 mL/min (ref >60)
GFR calc Af Amer: >60 mL/min (ref >60)
GFR calc non Af Amer: >60 mL/min (ref >60)
GFR calc non Af Amer: >60 mL/min (ref >60)
Glucose, Bld: 112 mg/dL — ABNORMAL HIGH (ref 70–99)
Glucose, Bld: 99 mg/dL (ref 70–99)
Potassium: 3.6 mEq/L (ref 3.5–5.1)
Potassium: 4.4 mEq/L (ref 3.5–5.1)
Sodium: 136 mEq/L (ref 135–145)
Sodium: 137 mEq/L (ref 135–145)

## 2011-02-18 LAB — BRAIN NATRIURETIC PEPTIDE: Pro B Natriuretic peptide (BNP): 327 pg/mL — ABNORMAL HIGH (ref 0.0–100.0)

## 2011-02-18 LAB — MAGNESIUM: Magnesium: 2.2 mg/dL (ref 1.5–2.5)

## 2011-02-23 LAB — HEPARIN LEVEL (UNFRACTIONATED)
Heparin Unfractionated: 0.19 IU/mL — ABNORMAL LOW (ref 0.30–0.70)
Heparin Unfractionated: 0.19 IU/mL — ABNORMAL LOW (ref 0.30–0.70)
Heparin Unfractionated: 0.26 IU/mL — ABNORMAL LOW (ref 0.30–0.70)
Heparin Unfractionated: 0.28 IU/mL — ABNORMAL LOW (ref 0.30–0.70)
Heparin Unfractionated: 0.29 IU/mL — ABNORMAL LOW (ref 0.30–0.70)
Heparin Unfractionated: 0.36 IU/mL (ref 0.30–0.70)
Heparin Unfractionated: 0.47 IU/mL (ref 0.30–0.70)
Heparin Unfractionated: 0.51 IU/mL (ref 0.30–0.70)

## 2011-02-23 LAB — CBC
HCT: 21.8 % — ABNORMAL LOW (ref 36.0–46.0)
HCT: 23 % — ABNORMAL LOW (ref 36.0–46.0)
HCT: 26 % — ABNORMAL LOW (ref 36.0–46.0)
HCT: 26.1 % — ABNORMAL LOW (ref 36.0–46.0)
HCT: 26.1 % — ABNORMAL LOW (ref 36.0–46.0)
HCT: 26.8 % — ABNORMAL LOW (ref 36.0–46.0)
HCT: 26.8 % — ABNORMAL LOW (ref 36.0–46.0)
HCT: 27.2 % — ABNORMAL LOW (ref 36.0–46.0)
HCT: 28.1 % — ABNORMAL LOW (ref 36.0–46.0)
HCT: 28.2 % — ABNORMAL LOW (ref 36.0–46.0)
HCT: 28.3 % — ABNORMAL LOW (ref 36.0–46.0)
HCT: 28.8 % — ABNORMAL LOW (ref 36.0–46.0)
HCT: 29.9 % — ABNORMAL LOW (ref 36.0–46.0)
HCT: 31.2 % — ABNORMAL LOW (ref 36.0–46.0)
HCT: 31.4 % — ABNORMAL LOW (ref 36.0–46.0)
HCT: 31.7 % — ABNORMAL LOW (ref 36.0–46.0)
Hemoglobin: 10.3 g/dL — ABNORMAL LOW (ref 12.0–15.0)
Hemoglobin: 10.5 g/dL — ABNORMAL LOW (ref 12.0–15.0)
Hemoglobin: 10.6 g/dL — ABNORMAL LOW (ref 12.0–15.0)
Hemoglobin: 10.8 g/dL — ABNORMAL LOW (ref 12.0–15.0)
Hemoglobin: 7.7 g/dL — ABNORMAL LOW (ref 12.0–15.0)
Hemoglobin: 7.9 g/dL — ABNORMAL LOW (ref 12.0–15.0)
Hemoglobin: 9 g/dL — ABNORMAL LOW (ref 12.0–15.0)
Hemoglobin: 9 g/dL — ABNORMAL LOW (ref 12.0–15.0)
Hemoglobin: 9 g/dL — ABNORMAL LOW (ref 12.0–15.0)
Hemoglobin: 9.3 g/dL — ABNORMAL LOW (ref 12.0–15.0)
Hemoglobin: 9.3 g/dL — ABNORMAL LOW (ref 12.0–15.0)
Hemoglobin: 9.4 g/dL — ABNORMAL LOW (ref 12.0–15.0)
Hemoglobin: 9.5 g/dL — ABNORMAL LOW (ref 12.0–15.0)
Hemoglobin: 9.6 g/dL — ABNORMAL LOW (ref 12.0–15.0)
Hemoglobin: 9.8 g/dL — ABNORMAL LOW (ref 12.0–15.0)
Hemoglobin: 9.9 g/dL — ABNORMAL LOW (ref 12.0–15.0)
MCHC: 33.5 g/dL (ref 30.0–36.0)
MCHC: 33.9 g/dL (ref 30.0–36.0)
MCHC: 33.9 g/dL (ref 30.0–36.0)
MCHC: 34 g/dL (ref 30.0–36.0)
MCHC: 34.1 g/dL (ref 30.0–36.0)
MCHC: 34.2 g/dL (ref 30.0–36.0)
MCHC: 34.3 g/dL (ref 30.0–36.0)
MCHC: 34.4 g/dL (ref 30.0–36.0)
MCHC: 34.4 g/dL (ref 30.0–36.0)
MCHC: 34.4 g/dL (ref 30.0–36.0)
MCHC: 34.5 g/dL (ref 30.0–36.0)
MCHC: 34.5 g/dL (ref 30.0–36.0)
MCHC: 34.6 g/dL (ref 30.0–36.0)
MCHC: 34.6 g/dL (ref 30.0–36.0)
MCHC: 34.6 g/dL (ref 30.0–36.0)
MCHC: 35.3 g/dL (ref 30.0–36.0)
MCV: 89.4 fL (ref 78.0–100.0)
MCV: 89.9 fL (ref 78.0–100.0)
MCV: 90.1 fL (ref 78.0–100.0)
MCV: 90.2 fL (ref 78.0–100.0)
MCV: 90.3 fL (ref 78.0–100.0)
MCV: 90.6 fL (ref 78.0–100.0)
MCV: 90.6 fL (ref 78.0–100.0)
MCV: 90.6 fL (ref 78.0–100.0)
MCV: 90.8 fL (ref 78.0–100.0)
MCV: 90.8 fL (ref 78.0–100.0)
MCV: 90.8 fL (ref 78.0–100.0)
MCV: 90.8 fL (ref 78.0–100.0)
MCV: 91.1 fL (ref 78.0–100.0)
MCV: 91.2 fL (ref 78.0–100.0)
MCV: 91.2 fL (ref 78.0–100.0)
MCV: 91.4 fL (ref 78.0–100.0)
Platelets: 129 10*3/uL — ABNORMAL LOW (ref 150–400)
Platelets: 167 10*3/uL (ref 150–400)
Platelets: 173 10*3/uL (ref 150–400)
Platelets: 182 10*3/uL (ref 150–400)
Platelets: 185 10*3/uL (ref 150–400)
Platelets: 188 10*3/uL (ref 150–400)
Platelets: 206 10*3/uL (ref 150–400)
Platelets: 227 10*3/uL (ref 150–400)
Platelets: 338 10*3/uL (ref 150–400)
Platelets: 468 10*3/uL — ABNORMAL HIGH (ref 150–400)
Platelets: 586 10*3/uL — ABNORMAL HIGH (ref 150–400)
Platelets: 672 10*3/uL — ABNORMAL HIGH (ref 150–400)
Platelets: 74 10*3/uL — ABNORMAL LOW (ref 150–400)
Platelets: 78 10*3/uL — ABNORMAL LOW (ref 150–400)
Platelets: 83 10*3/uL — ABNORMAL LOW (ref 150–400)
Platelets: 99 10*3/uL — ABNORMAL LOW (ref 150–400)
RBC: 2.44 MIL/uL — ABNORMAL LOW (ref 3.87–5.11)
RBC: 2.54 MIL/uL — ABNORMAL LOW (ref 3.87–5.11)
RBC: 2.85 MIL/uL — ABNORMAL LOW (ref 3.87–5.11)
RBC: 2.88 MIL/uL — ABNORMAL LOW (ref 3.87–5.11)
RBC: 2.89 MIL/uL — ABNORMAL LOW (ref 3.87–5.11)
RBC: 2.93 MIL/uL — ABNORMAL LOW (ref 3.87–5.11)
RBC: 2.98 MIL/uL — ABNORMAL LOW (ref 3.87–5.11)
RBC: 3 MIL/uL — ABNORMAL LOW (ref 3.87–5.11)
RBC: 3.1 MIL/uL — ABNORMAL LOW (ref 3.87–5.11)
RBC: 3.1 MIL/uL — ABNORMAL LOW (ref 3.87–5.11)
RBC: 3.14 MIL/uL — ABNORMAL LOW (ref 3.87–5.11)
RBC: 3.17 MIL/uL — ABNORMAL LOW (ref 3.87–5.11)
RBC: 3.32 MIL/uL — ABNORMAL LOW (ref 3.87–5.11)
RBC: 3.43 MIL/uL — ABNORMAL LOW (ref 3.87–5.11)
RBC: 3.44 MIL/uL — ABNORMAL LOW (ref 3.87–5.11)
RBC: 3.5 MIL/uL — ABNORMAL LOW (ref 3.87–5.11)
RDW: 13.6 % (ref 11.5–15.5)
RDW: 13.7 % (ref 11.5–15.5)
RDW: 13.8 % (ref 11.5–15.5)
RDW: 14 % (ref 11.5–15.5)
RDW: 14.3 % (ref 11.5–15.5)
RDW: 14.4 % (ref 11.5–15.5)
RDW: 14.7 % (ref 11.5–15.5)
RDW: 14.9 % (ref 11.5–15.5)
RDW: 14.9 % (ref 11.5–15.5)
RDW: 15.2 % (ref 11.5–15.5)
RDW: 15.2 % (ref 11.5–15.5)
RDW: 15.2 % (ref 11.5–15.5)
RDW: 15.2 % (ref 11.5–15.5)
RDW: 15.3 % (ref 11.5–15.5)
RDW: 15.4 % (ref 11.5–15.5)
RDW: 15.4 % (ref 11.5–15.5)
WBC: 12.7 10*3/uL — ABNORMAL HIGH (ref 4.0–10.5)
WBC: 13.1 10*3/uL — ABNORMAL HIGH (ref 4.0–10.5)
WBC: 13.2 10*3/uL — ABNORMAL HIGH (ref 4.0–10.5)
WBC: 13.3 10*3/uL — ABNORMAL HIGH (ref 4.0–10.5)
WBC: 13.4 10*3/uL — ABNORMAL HIGH (ref 4.0–10.5)
WBC: 15.3 10*3/uL — ABNORMAL HIGH (ref 4.0–10.5)
WBC: 15.4 10*3/uL — ABNORMAL HIGH (ref 4.0–10.5)
WBC: 15.7 10*3/uL — ABNORMAL HIGH (ref 4.0–10.5)
WBC: 15.7 10*3/uL — ABNORMAL HIGH (ref 4.0–10.5)
WBC: 15.9 10*3/uL — ABNORMAL HIGH (ref 4.0–10.5)
WBC: 19.1 10*3/uL — ABNORMAL HIGH (ref 4.0–10.5)
WBC: 19.5 10*3/uL — ABNORMAL HIGH (ref 4.0–10.5)
WBC: 19.8 10*3/uL — ABNORMAL HIGH (ref 4.0–10.5)
WBC: 20.2 10*3/uL — ABNORMAL HIGH (ref 4.0–10.5)
WBC: 21 10*3/uL — ABNORMAL HIGH (ref 4.0–10.5)
WBC: 21.4 10*3/uL — ABNORMAL HIGH (ref 4.0–10.5)

## 2011-02-23 LAB — BASIC METABOLIC PANEL
BUN: 10 mg/dL (ref 6–23)
BUN: 10 mg/dL (ref 6–23)
BUN: 10 mg/dL (ref 6–23)
BUN: 10 mg/dL (ref 6–23)
BUN: 10 mg/dL (ref 6–23)
BUN: 11 mg/dL (ref 6–23)
BUN: 11 mg/dL (ref 6–23)
BUN: 12 mg/dL (ref 6–23)
BUN: 13 mg/dL (ref 6–23)
BUN: 13 mg/dL (ref 6–23)
BUN: 13 mg/dL (ref 6–23)
BUN: 14 mg/dL (ref 6–23)
BUN: 14 mg/dL (ref 6–23)
BUN: 15 mg/dL (ref 6–23)
BUN: 15 mg/dL (ref 6–23)
BUN: 15 mg/dL (ref 6–23)
BUN: 16 mg/dL (ref 6–23)
BUN: 16 mg/dL (ref 6–23)
BUN: 16 mg/dL (ref 6–23)
BUN: 16 mg/dL (ref 6–23)
BUN: 9 mg/dL (ref 6–23)
CO2: 15 mEq/L — ABNORMAL LOW (ref 19–32)
CO2: 16 mEq/L — ABNORMAL LOW (ref 19–32)
CO2: 16 mEq/L — ABNORMAL LOW (ref 19–32)
CO2: 17 mEq/L — ABNORMAL LOW (ref 19–32)
CO2: 18 mEq/L — ABNORMAL LOW (ref 19–32)
CO2: 19 mEq/L (ref 19–32)
CO2: 20 mEq/L (ref 19–32)
CO2: 21 mEq/L (ref 19–32)
CO2: 21 mEq/L (ref 19–32)
CO2: 21 mEq/L (ref 19–32)
CO2: 21 mEq/L (ref 19–32)
CO2: 22 mEq/L (ref 19–32)
CO2: 22 mEq/L (ref 19–32)
CO2: 23 mEq/L (ref 19–32)
CO2: 24 mEq/L (ref 19–32)
CO2: 24 mEq/L (ref 19–32)
CO2: 25 mEq/L (ref 19–32)
CO2: 25 mEq/L (ref 19–32)
CO2: 25 mEq/L (ref 19–32)
CO2: 30 mEq/L (ref 19–32)
CO2: 34 mEq/L — ABNORMAL HIGH (ref 19–32)
Calcium: 5.1 mg/dL — CL (ref 8.4–10.5)
Calcium: 5.4 mg/dL — CL (ref 8.4–10.5)
Calcium: 5.6 mg/dL — CL (ref 8.4–10.5)
Calcium: 5.8 mg/dL — CL (ref 8.4–10.5)
Calcium: 6.1 mg/dL — CL (ref 8.4–10.5)
Calcium: 6.4 mg/dL — CL (ref 8.4–10.5)
Calcium: 6.4 mg/dL — CL (ref 8.4–10.5)
Calcium: 6.5 mg/dL — ABNORMAL LOW (ref 8.4–10.5)
Calcium: 6.5 mg/dL — ABNORMAL LOW (ref 8.4–10.5)
Calcium: 6.7 mg/dL — ABNORMAL LOW (ref 8.4–10.5)
Calcium: 6.7 mg/dL — ABNORMAL LOW (ref 8.4–10.5)
Calcium: 6.7 mg/dL — ABNORMAL LOW (ref 8.4–10.5)
Calcium: 7.1 mg/dL — ABNORMAL LOW (ref 8.4–10.5)
Calcium: 7.7 mg/dL — ABNORMAL LOW (ref 8.4–10.5)
Calcium: 7.8 mg/dL — ABNORMAL LOW (ref 8.4–10.5)
Calcium: 8 mg/dL — ABNORMAL LOW (ref 8.4–10.5)
Calcium: 8.2 mg/dL — ABNORMAL LOW (ref 8.4–10.5)
Calcium: 8.2 mg/dL — ABNORMAL LOW (ref 8.4–10.5)
Calcium: 8.3 mg/dL — ABNORMAL LOW (ref 8.4–10.5)
Calcium: 8.4 mg/dL (ref 8.4–10.5)
Calcium: 8.5 mg/dL (ref 8.4–10.5)
Chloride: 100 mEq/L (ref 96–112)
Chloride: 104 mEq/L (ref 96–112)
Chloride: 106 mEq/L (ref 96–112)
Chloride: 106 mEq/L (ref 96–112)
Chloride: 107 mEq/L (ref 96–112)
Chloride: 107 mEq/L (ref 96–112)
Chloride: 108 mEq/L (ref 96–112)
Chloride: 109 mEq/L (ref 96–112)
Chloride: 112 mEq/L (ref 96–112)
Chloride: 112 mEq/L (ref 96–112)
Chloride: 112 mEq/L (ref 96–112)
Chloride: 113 mEq/L — ABNORMAL HIGH (ref 96–112)
Chloride: 113 mEq/L — ABNORMAL HIGH (ref 96–112)
Chloride: 113 mEq/L — ABNORMAL HIGH (ref 96–112)
Chloride: 113 mEq/L — ABNORMAL HIGH (ref 96–112)
Chloride: 113 mEq/L — ABNORMAL HIGH (ref 96–112)
Chloride: 114 mEq/L — ABNORMAL HIGH (ref 96–112)
Chloride: 115 mEq/L — ABNORMAL HIGH (ref 96–112)
Chloride: 115 mEq/L — ABNORMAL HIGH (ref 96–112)
Chloride: 116 mEq/L — ABNORMAL HIGH (ref 96–112)
Chloride: 119 mEq/L — ABNORMAL HIGH (ref 96–112)
Creatinine, Ser: 0.54 mg/dL (ref 0.4–1.2)
Creatinine, Ser: 0.56 mg/dL (ref 0.4–1.2)
Creatinine, Ser: 0.57 mg/dL (ref 0.4–1.2)
Creatinine, Ser: 0.58 mg/dL (ref 0.4–1.2)
Creatinine, Ser: 0.71 mg/dL (ref 0.4–1.2)
Creatinine, Ser: 0.73 mg/dL (ref 0.4–1.2)
Creatinine, Ser: 0.74 mg/dL (ref 0.4–1.2)
Creatinine, Ser: 0.74 mg/dL (ref 0.4–1.2)
Creatinine, Ser: 0.75 mg/dL (ref 0.4–1.2)
Creatinine, Ser: 0.76 mg/dL (ref 0.4–1.2)
Creatinine, Ser: 0.79 mg/dL (ref 0.4–1.2)
Creatinine, Ser: 0.79 mg/dL (ref 0.4–1.2)
Creatinine, Ser: 0.81 mg/dL (ref 0.4–1.2)
Creatinine, Ser: 0.81 mg/dL (ref 0.4–1.2)
Creatinine, Ser: 0.82 mg/dL (ref 0.4–1.2)
Creatinine, Ser: 0.82 mg/dL (ref 0.4–1.2)
Creatinine, Ser: 0.84 mg/dL (ref 0.4–1.2)
Creatinine, Ser: 0.84 mg/dL (ref 0.4–1.2)
Creatinine, Ser: 0.91 mg/dL (ref 0.4–1.2)
Creatinine, Ser: 0.97 mg/dL (ref 0.4–1.2)
Creatinine, Ser: 0.98 mg/dL (ref 0.4–1.2)
GFR calc Af Amer: >60 mL/min (ref >60)
GFR calc Af Amer: >60 mL/min (ref >60)
GFR calc Af Amer: >60 mL/min (ref >60)
GFR calc Af Amer: >60 mL/min (ref >60)
GFR calc Af Amer: >60 mL/min (ref >60)
GFR calc Af Amer: >60 mL/min (ref >60)
GFR calc Af Amer: >60 mL/min (ref >60)
GFR calc Af Amer: >60 mL/min (ref >60)
GFR calc Af Amer: >60 mL/min (ref >60)
GFR calc Af Amer: >60 mL/min (ref >60)
GFR calc Af Amer: >60 mL/min (ref >60)
GFR calc Af Amer: >60 mL/min (ref >60)
GFR calc Af Amer: >60 mL/min (ref >60)
GFR calc Af Amer: >60 mL/min (ref >60)
GFR calc Af Amer: >60 mL/min (ref >60)
GFR calc Af Amer: >60 mL/min (ref >60)
GFR calc Af Amer: >60 mL/min (ref >60)
GFR calc Af Amer: >60 mL/min (ref >60)
GFR calc Af Amer: >60 mL/min (ref >60)
GFR calc Af Amer: >60 mL/min (ref >60)
GFR calc Af Amer: >60 mL/min (ref >60)
GFR calc non Af Amer: 59 mL/min — ABNORMAL LOW (ref >60)
GFR calc non Af Amer: 60 mL/min — ABNORMAL LOW (ref >60)
GFR calc non Af Amer: >60 mL/min (ref >60)
GFR calc non Af Amer: >60 mL/min (ref >60)
GFR calc non Af Amer: >60 mL/min (ref >60)
GFR calc non Af Amer: >60 mL/min (ref >60)
GFR calc non Af Amer: >60 mL/min (ref >60)
GFR calc non Af Amer: >60 mL/min (ref >60)
GFR calc non Af Amer: >60 mL/min (ref >60)
GFR calc non Af Amer: >60 mL/min (ref >60)
GFR calc non Af Amer: >60 mL/min (ref >60)
GFR calc non Af Amer: >60 mL/min (ref >60)
GFR calc non Af Amer: >60 mL/min (ref >60)
GFR calc non Af Amer: >60 mL/min (ref >60)
GFR calc non Af Amer: >60 mL/min (ref >60)
GFR calc non Af Amer: >60 mL/min (ref >60)
GFR calc non Af Amer: >60 mL/min (ref >60)
GFR calc non Af Amer: >60 mL/min (ref >60)
GFR calc non Af Amer: >60 mL/min (ref >60)
GFR calc non Af Amer: >60 mL/min (ref >60)
GFR calc non Af Amer: >60 mL/min (ref >60)
Glucose, Bld: 100 mg/dL — ABNORMAL HIGH (ref 70–99)
Glucose, Bld: 105 mg/dL — ABNORMAL HIGH (ref 70–99)
Glucose, Bld: 107 mg/dL — ABNORMAL HIGH (ref 70–99)
Glucose, Bld: 109 mg/dL — ABNORMAL HIGH (ref 70–99)
Glucose, Bld: 109 mg/dL — ABNORMAL HIGH (ref 70–99)
Glucose, Bld: 110 mg/dL — ABNORMAL HIGH (ref 70–99)
Glucose, Bld: 113 mg/dL — ABNORMAL HIGH (ref 70–99)
Glucose, Bld: 114 mg/dL — ABNORMAL HIGH (ref 70–99)
Glucose, Bld: 115 mg/dL — ABNORMAL HIGH (ref 70–99)
Glucose, Bld: 117 mg/dL — ABNORMAL HIGH (ref 70–99)
Glucose, Bld: 121 mg/dL — ABNORMAL HIGH (ref 70–99)
Glucose, Bld: 130 mg/dL — ABNORMAL HIGH (ref 70–99)
Glucose, Bld: 136 mg/dL — ABNORMAL HIGH (ref 70–99)
Glucose, Bld: 152 mg/dL — ABNORMAL HIGH (ref 70–99)
Glucose, Bld: 183 mg/dL — ABNORMAL HIGH (ref 70–99)
Glucose, Bld: 264 mg/dL — ABNORMAL HIGH (ref 70–99)
Glucose, Bld: 273 mg/dL — ABNORMAL HIGH (ref 70–99)
Glucose, Bld: 305 mg/dL — ABNORMAL HIGH (ref 70–99)
Glucose, Bld: 307 mg/dL — ABNORMAL HIGH (ref 70–99)
Glucose, Bld: 97 mg/dL (ref 70–99)
Glucose, Bld: 99 mg/dL (ref 70–99)
Potassium: 2.3 mEq/L — CL (ref 3.5–5.1)
Potassium: 2.7 mEq/L — CL (ref 3.5–5.1)
Potassium: 2.8 mEq/L — ABNORMAL LOW (ref 3.5–5.1)
Potassium: 2.8 mEq/L — ABNORMAL LOW (ref 3.5–5.1)
Potassium: 2.9 mEq/L — ABNORMAL LOW (ref 3.5–5.1)
Potassium: 3 mEq/L — ABNORMAL LOW (ref 3.5–5.1)
Potassium: 3 mEq/L — ABNORMAL LOW (ref 3.5–5.1)
Potassium: 3.3 mEq/L — ABNORMAL LOW (ref 3.5–5.1)
Potassium: 3.4 mEq/L — ABNORMAL LOW (ref 3.5–5.1)
Potassium: 3.5 mEq/L (ref 3.5–5.1)
Potassium: 3.5 mEq/L (ref 3.5–5.1)
Potassium: 3.5 mEq/L (ref 3.5–5.1)
Potassium: 3.6 mEq/L (ref 3.5–5.1)
Potassium: 3.6 mEq/L (ref 3.5–5.1)
Potassium: 3.7 mEq/L (ref 3.5–5.1)
Potassium: 3.8 mEq/L (ref 3.5–5.1)
Potassium: 3.8 mEq/L (ref 3.5–5.1)
Potassium: 3.8 mEq/L (ref 3.5–5.1)
Potassium: 3.9 mEq/L (ref 3.5–5.1)
Potassium: 4 mEq/L (ref 3.5–5.1)
Potassium: 4 mEq/L (ref 3.5–5.1)
Sodium: 136 mEq/L (ref 135–145)
Sodium: 136 mEq/L (ref 135–145)
Sodium: 136 mEq/L (ref 135–145)
Sodium: 137 mEq/L (ref 135–145)
Sodium: 137 mEq/L (ref 135–145)
Sodium: 137 mEq/L (ref 135–145)
Sodium: 137 mEq/L (ref 135–145)
Sodium: 137 mEq/L (ref 135–145)
Sodium: 139 mEq/L (ref 135–145)
Sodium: 139 mEq/L (ref 135–145)
Sodium: 139 mEq/L (ref 135–145)
Sodium: 139 mEq/L (ref 135–145)
Sodium: 139 mEq/L (ref 135–145)
Sodium: 139 mEq/L (ref 135–145)
Sodium: 139 mEq/L (ref 135–145)
Sodium: 140 mEq/L (ref 135–145)
Sodium: 140 mEq/L (ref 135–145)
Sodium: 141 mEq/L (ref 135–145)
Sodium: 142 mEq/L (ref 135–145)
Sodium: 142 mEq/L (ref 135–145)
Sodium: 142 mEq/L (ref 135–145)

## 2011-02-23 LAB — GLUCOSE, CAPILLARY
Glucose-Capillary: 100 mg/dL — ABNORMAL HIGH (ref 70–99)
Glucose-Capillary: 101 mg/dL — ABNORMAL HIGH (ref 70–99)
Glucose-Capillary: 101 mg/dL — ABNORMAL HIGH (ref 70–99)
Glucose-Capillary: 101 mg/dL — ABNORMAL HIGH (ref 70–99)
Glucose-Capillary: 102 mg/dL — ABNORMAL HIGH (ref 70–99)
Glucose-Capillary: 103 mg/dL — ABNORMAL HIGH (ref 70–99)
Glucose-Capillary: 103 mg/dL — ABNORMAL HIGH (ref 70–99)
Glucose-Capillary: 104 mg/dL — ABNORMAL HIGH (ref 70–99)
Glucose-Capillary: 105 mg/dL — ABNORMAL HIGH (ref 70–99)
Glucose-Capillary: 105 mg/dL — ABNORMAL HIGH (ref 70–99)
Glucose-Capillary: 105 mg/dL — ABNORMAL HIGH (ref 70–99)
Glucose-Capillary: 106 mg/dL — ABNORMAL HIGH (ref 70–99)
Glucose-Capillary: 106 mg/dL — ABNORMAL HIGH (ref 70–99)
Glucose-Capillary: 107 mg/dL — ABNORMAL HIGH (ref 70–99)
Glucose-Capillary: 108 mg/dL — ABNORMAL HIGH (ref 70–99)
Glucose-Capillary: 108 mg/dL — ABNORMAL HIGH (ref 70–99)
Glucose-Capillary: 109 mg/dL — ABNORMAL HIGH (ref 70–99)
Glucose-Capillary: 111 mg/dL — ABNORMAL HIGH (ref 70–99)
Glucose-Capillary: 112 mg/dL — ABNORMAL HIGH (ref 70–99)
Glucose-Capillary: 112 mg/dL — ABNORMAL HIGH (ref 70–99)
Glucose-Capillary: 113 mg/dL — ABNORMAL HIGH (ref 70–99)
Glucose-Capillary: 113 mg/dL — ABNORMAL HIGH (ref 70–99)
Glucose-Capillary: 114 mg/dL — ABNORMAL HIGH (ref 70–99)
Glucose-Capillary: 115 mg/dL — ABNORMAL HIGH (ref 70–99)
Glucose-Capillary: 116 mg/dL — ABNORMAL HIGH (ref 70–99)
Glucose-Capillary: 117 mg/dL — ABNORMAL HIGH (ref 70–99)
Glucose-Capillary: 119 mg/dL — ABNORMAL HIGH (ref 70–99)
Glucose-Capillary: 120 mg/dL — ABNORMAL HIGH (ref 70–99)
Glucose-Capillary: 120 mg/dL — ABNORMAL HIGH (ref 70–99)
Glucose-Capillary: 121 mg/dL — ABNORMAL HIGH (ref 70–99)
Glucose-Capillary: 122 mg/dL — ABNORMAL HIGH (ref 70–99)
Glucose-Capillary: 122 mg/dL — ABNORMAL HIGH (ref 70–99)
Glucose-Capillary: 123 mg/dL — ABNORMAL HIGH (ref 70–99)
Glucose-Capillary: 126 mg/dL — ABNORMAL HIGH (ref 70–99)
Glucose-Capillary: 128 mg/dL — ABNORMAL HIGH (ref 70–99)
Glucose-Capillary: 129 mg/dL — ABNORMAL HIGH (ref 70–99)
Glucose-Capillary: 130 mg/dL — ABNORMAL HIGH (ref 70–99)
Glucose-Capillary: 130 mg/dL — ABNORMAL HIGH (ref 70–99)
Glucose-Capillary: 130 mg/dL — ABNORMAL HIGH (ref 70–99)
Glucose-Capillary: 130 mg/dL — ABNORMAL HIGH (ref 70–99)
Glucose-Capillary: 130 mg/dL — ABNORMAL HIGH (ref 70–99)
Glucose-Capillary: 132 mg/dL — ABNORMAL HIGH (ref 70–99)
Glucose-Capillary: 133 mg/dL — ABNORMAL HIGH (ref 70–99)
Glucose-Capillary: 133 mg/dL — ABNORMAL HIGH (ref 70–99)
Glucose-Capillary: 138 mg/dL — ABNORMAL HIGH (ref 70–99)
Glucose-Capillary: 141 mg/dL — ABNORMAL HIGH (ref 70–99)
Glucose-Capillary: 145 mg/dL — ABNORMAL HIGH (ref 70–99)
Glucose-Capillary: 153 mg/dL — ABNORMAL HIGH (ref 70–99)
Glucose-Capillary: 157 mg/dL — ABNORMAL HIGH (ref 70–99)
Glucose-Capillary: 158 mg/dL — ABNORMAL HIGH (ref 70–99)
Glucose-Capillary: 159 mg/dL — ABNORMAL HIGH (ref 70–99)
Glucose-Capillary: 160 mg/dL — ABNORMAL HIGH (ref 70–99)
Glucose-Capillary: 160 mg/dL — ABNORMAL HIGH (ref 70–99)
Glucose-Capillary: 164 mg/dL — ABNORMAL HIGH (ref 70–99)
Glucose-Capillary: 200 mg/dL — ABNORMAL HIGH (ref 70–99)
Glucose-Capillary: 220 mg/dL — ABNORMAL HIGH (ref 70–99)
Glucose-Capillary: 225 mg/dL — ABNORMAL HIGH (ref 70–99)
Glucose-Capillary: 226 mg/dL — ABNORMAL HIGH (ref 70–99)
Glucose-Capillary: 227 mg/dL — ABNORMAL HIGH (ref 70–99)
Glucose-Capillary: 253 mg/dL — ABNORMAL HIGH (ref 70–99)
Glucose-Capillary: 262 mg/dL — ABNORMAL HIGH (ref 70–99)
Glucose-Capillary: 267 mg/dL — ABNORMAL HIGH (ref 70–99)
Glucose-Capillary: 268 mg/dL — ABNORMAL HIGH (ref 70–99)
Glucose-Capillary: 274 mg/dL — ABNORMAL HIGH (ref 70–99)
Glucose-Capillary: 284 mg/dL — ABNORMAL HIGH (ref 70–99)
Glucose-Capillary: 284 mg/dL — ABNORMAL HIGH (ref 70–99)
Glucose-Capillary: 299 mg/dL — ABNORMAL HIGH (ref 70–99)
Glucose-Capillary: 63 mg/dL — ABNORMAL LOW (ref 70–99)
Glucose-Capillary: 84 mg/dL (ref 70–99)
Glucose-Capillary: 86 mg/dL (ref 70–99)
Glucose-Capillary: 88 mg/dL (ref 70–99)
Glucose-Capillary: 88 mg/dL (ref 70–99)
Glucose-Capillary: 88 mg/dL (ref 70–99)
Glucose-Capillary: 92 mg/dL (ref 70–99)
Glucose-Capillary: 94 mg/dL (ref 70–99)
Glucose-Capillary: 94 mg/dL (ref 70–99)
Glucose-Capillary: 95 mg/dL (ref 70–99)
Glucose-Capillary: 96 mg/dL (ref 70–99)
Glucose-Capillary: 96 mg/dL (ref 70–99)
Glucose-Capillary: 97 mg/dL (ref 70–99)
Glucose-Capillary: 98 mg/dL (ref 70–99)
Glucose-Capillary: 99 mg/dL (ref 70–99)
Glucose-Capillary: 99 mg/dL (ref 70–99)
Glucose-Capillary: 99 mg/dL (ref 70–99)

## 2011-02-23 LAB — PROTIME-INR
INR: 1.59 — ABNORMAL HIGH (ref 0.00–1.49)
INR: 1.69 — ABNORMAL HIGH (ref 0.00–1.49)
INR: 3.87 — ABNORMAL HIGH (ref 0.00–1.49)
Prothrombin Time: 18.8 seconds — ABNORMAL HIGH (ref 11.6–15.2)
Prothrombin Time: 19.7 seconds — ABNORMAL HIGH (ref 11.6–15.2)
Prothrombin Time: 37.7 seconds — ABNORMAL HIGH (ref 11.6–15.2)

## 2011-02-23 LAB — BLOOD GAS, ARTERIAL
Acid-Base Excess: 0.3 mmol/L (ref 0.0–2.0)
Acid-Base Excess: 0.4 mmol/L (ref 0.0–2.0)
Acid-Base Excess: 0.5 mmol/L (ref 0.0–2.0)
Acid-Base Excess: 1.9 mmol/L (ref 0.0–2.0)
Acid-Base Excess: 5.4 mmol/L — ABNORMAL HIGH (ref 0.0–2.0)
Acid-base deficit: 10.9 mmol/L — ABNORMAL HIGH (ref 0.0–2.0)
Acid-base deficit: 2.2 mmol/L — ABNORMAL HIGH (ref 0.0–2.0)
Acid-base deficit: 2.9 mmol/L — ABNORMAL HIGH (ref 0.0–2.0)
Acid-base deficit: 3.1 mmol/L — ABNORMAL HIGH (ref 0.0–2.0)
Acid-base deficit: 3.6 mmol/L — ABNORMAL HIGH (ref 0.0–2.0)
Acid-base deficit: 4.2 mmol/L — ABNORMAL HIGH (ref 0.0–2.0)
Acid-base deficit: 5.5 mmol/L — ABNORMAL HIGH (ref 0.0–2.0)
Acid-base deficit: 9.1 mmol/L — ABNORMAL HIGH (ref 0.0–2.0)
Bicarbonate: 13.9 mEq/L — ABNORMAL LOW (ref 20.0–24.0)
Bicarbonate: 15.7 mEq/L — ABNORMAL LOW (ref 20.0–24.0)
Bicarbonate: 18.3 mEq/L — ABNORMAL LOW (ref 20.0–24.0)
Bicarbonate: 19.3 mEq/L — ABNORMAL LOW (ref 20.0–24.0)
Bicarbonate: 20.3 mEq/L (ref 20.0–24.0)
Bicarbonate: 20.7 mEq/L (ref 20.0–24.0)
Bicarbonate: 20.8 mEq/L (ref 20.0–24.0)
Bicarbonate: 21.3 mEq/L (ref 20.0–24.0)
Bicarbonate: 24.3 mEq/L — ABNORMAL HIGH (ref 20.0–24.0)
Bicarbonate: 24.4 mEq/L — ABNORMAL HIGH (ref 20.0–24.0)
Bicarbonate: 25.1 mEq/L — ABNORMAL HIGH (ref 20.0–24.0)
Bicarbonate: 25.1 mEq/L — ABNORMAL HIGH (ref 20.0–24.0)
Bicarbonate: 29 mEq/L — ABNORMAL HIGH (ref 20.0–24.0)
Drawn by: 244901
FIO2: 0.4 %
FIO2: 0.4 %
FIO2: 0.4 %
FIO2: 0.4 %
FIO2: 0.5 %
FIO2: 0.5 %
FIO2: 0.8 %
FIO2: 1 %
FIO2: 1 %
FIO2: 1 %
FIO2: 40 %
FIO2: 50 %
MECHVT: 440 mL
MECHVT: 440 mL
MECHVT: 440 mL
MECHVT: 500 mL
MECHVT: 500 mL
MECHVT: 500 mL
MECHVT: 500 mL
MECHVT: 500 mL
MECHVT: 500 mL
MECHVT: 550 mL
MECHVT: 550 mL
MECHVT: 550 mL
O2 Content: 2 L/min
O2 Saturation: 87.2 %
O2 Saturation: 92.2 %
O2 Saturation: 92.7 %
O2 Saturation: 94.7 %
O2 Saturation: 96.6 %
O2 Saturation: 96.6 %
O2 Saturation: 96.7 %
O2 Saturation: 98.2 %
O2 Saturation: 98.3 %
O2 Saturation: 98.8 %
O2 Saturation: 99 %
O2 Saturation: 99.4 %
O2 Saturation: 99.7 %
PEEP: 10 cmH2O
PEEP: 10 cmH2O
PEEP: 10 cmH2O
PEEP: 10 cmH2O
PEEP: 10 cmH2O
PEEP: 10 cmH2O
PEEP: 5 cmH2O
PEEP: 5 cmH2O
PEEP: 5 cmH2O
PEEP: 5 cmH2O
PEEP: 5 cmH2O
PEEP: 5 cmH2O
Patient temperature: 100.4
Patient temperature: 101.5
Patient temperature: 90
Patient temperature: 91.2
Patient temperature: 91.4
Patient temperature: 91.4
Patient temperature: 91.4
Patient temperature: 96.8
Patient temperature: 98.6
Patient temperature: 98.6
Patient temperature: 98.6
Patient temperature: 99.2
Patient temperature: 99.5
RATE: 12 resp/min
RATE: 12 resp/min
RATE: 12 resp/min
RATE: 16 resp/min
RATE: 20 resp/min
RATE: 20 resp/min
RATE: 20 resp/min
RATE: 20 resp/min
RATE: 28 resp/min
RATE: 28 resp/min
RATE: 28 resp/min
RATE: 28 resp/min
TCO2: 14.8 mmol/L (ref 0–100)
TCO2: 16.7 mmol/L (ref 0–100)
TCO2: 19.2 mmol/L (ref 0–100)
TCO2: 20 mmol/L (ref 0–100)
TCO2: 21.3 mmol/L (ref 0–100)
TCO2: 21.7 mmol/L (ref 0–100)
TCO2: 21.9 mmol/L (ref 0–100)
TCO2: 22.2 mmol/L (ref 0–100)
TCO2: 25.5 mmol/L (ref 0–100)
TCO2: 25.5 mmol/L (ref 0–100)
TCO2: 26.1 mmol/L (ref 0–100)
TCO2: 26.5 mmol/L (ref 0–100)
TCO2: 30.2 mmol/L (ref 0–100)
pCO2 arterial: 19.4 mmHg — CL (ref 35.0–45.0)
pCO2 arterial: 22.7 mmHg — ABNORMAL LOW (ref 35.0–45.0)
pCO2 arterial: 23.5 mmHg — ABNORMAL LOW (ref 35.0–45.0)
pCO2 arterial: 25.6 mmHg — ABNORMAL LOW (ref 35.0–45.0)
pCO2 arterial: 26.6 mmHg — ABNORMAL LOW (ref 35.0–45.0)
pCO2 arterial: 29.9 mmHg — ABNORMAL LOW (ref 35.0–45.0)
pCO2 arterial: 33 mmHg — ABNORMAL LOW (ref 35.0–45.0)
pCO2 arterial: 33.7 mmHg — ABNORMAL LOW (ref 35.0–45.0)
pCO2 arterial: 39.2 mmHg (ref 35.0–45.0)
pCO2 arterial: 40.4 mmHg (ref 35.0–45.0)
pCO2 arterial: 41 mmHg (ref 35.0–45.0)
pCO2 arterial: 42 mmHg (ref 35.0–45.0)
pCO2 arterial: 46.1 mmHg — ABNORMAL HIGH (ref 35.0–45.0)
pH, Arterial: 7.319 — ABNORMAL LOW (ref 7.350–7.400)
pH, Arterial: 7.358 (ref 7.350–7.400)
pH, Arterial: 7.381 (ref 7.350–7.400)
pH, Arterial: 7.381 (ref 7.350–7.400)
pH, Arterial: 7.4 (ref 7.350–7.400)
pH, Arterial: 7.406 — ABNORMAL HIGH (ref 7.350–7.400)
pH, Arterial: 7.409 — ABNORMAL HIGH (ref 7.350–7.400)
pH, Arterial: 7.46 — ABNORMAL HIGH (ref 7.350–7.400)
pH, Arterial: 7.471 — ABNORMAL HIGH (ref 7.350–7.400)
pH, Arterial: 7.479 — ABNORMAL HIGH (ref 7.350–7.400)
pH, Arterial: 7.483 — ABNORMAL HIGH (ref 7.350–7.400)
pH, Arterial: 7.485 — ABNORMAL HIGH (ref 7.350–7.400)
pH, Arterial: 7.586 — ABNORMAL HIGH (ref 7.350–7.400)
pO2, Arterial: 100 mmHg (ref 80.0–100.0)
pO2, Arterial: 103 mmHg — ABNORMAL HIGH (ref 80.0–100.0)
pO2, Arterial: 126 mmHg — ABNORMAL HIGH (ref 80.0–100.0)
pO2, Arterial: 228 mmHg — ABNORMAL HIGH (ref 80.0–100.0)
pO2, Arterial: 42.5 mmHg — ABNORMAL LOW (ref 80.0–100.0)
pO2, Arterial: 53.7 mmHg — ABNORMAL LOW (ref 80.0–100.0)
pO2, Arterial: 65.9 mmHg — ABNORMAL LOW (ref 80.0–100.0)
pO2, Arterial: 69.6 mmHg — ABNORMAL LOW (ref 80.0–100.0)
pO2, Arterial: 72.2 mmHg — ABNORMAL LOW (ref 80.0–100.0)
pO2, Arterial: 74.7 mmHg — ABNORMAL LOW (ref 80.0–100.0)
pO2, Arterial: 79.9 mmHg — ABNORMAL LOW (ref 80.0–100.0)
pO2, Arterial: 80.8 mmHg (ref 80.0–100.0)
pO2, Arterial: 83.3 mmHg (ref 80.0–100.0)

## 2011-02-23 LAB — APTT
aPTT: 112 seconds — ABNORMAL HIGH (ref 24–37)
aPTT: 34 seconds (ref 24–37)
aPTT: 38 seconds — ABNORMAL HIGH (ref 24–37)
aPTT: 84 seconds — ABNORMAL HIGH (ref 24–37)

## 2011-02-23 LAB — CARBOXYHEMOGLOBIN
Carboxyhemoglobin: 0.9 % (ref 0.5–1.5)
Carboxyhemoglobin: 0.9 % (ref 0.5–1.5)
Methemoglobin: 0.4 % (ref 0.0–1.5)
Methemoglobin: 0.6 % (ref 0.0–1.5)
O2 Saturation: 74.4 %
O2 Saturation: 99.6 %
Total hemoglobin: 9.3 g/dL — ABNORMAL LOW (ref 12.5–16.0)
Total hemoglobin: 9.5 g/dL — ABNORMAL LOW (ref 12.5–16.0)

## 2011-02-23 LAB — POCT I-STAT, CHEM 8
BUN: 18 mg/dL (ref 6–23)
Calcium, Ion: 1.03 mmol/L — ABNORMAL LOW (ref 1.12–1.32)
Chloride: 108 mEq/L (ref 96–112)
Creatinine, Ser: 0.8 mg/dL (ref 0.4–1.2)
Glucose, Bld: 321 mg/dL — ABNORMAL HIGH (ref 70–99)
HCT: 31 % — ABNORMAL LOW (ref 36.0–46.0)
Hemoglobin: 10.5 g/dL — ABNORMAL LOW (ref 12.0–15.0)
Potassium: 2.5 mEq/L — CL (ref 3.5–5.1)
Sodium: 139 mEq/L (ref 135–145)
TCO2: 15 mmol/L (ref 0–100)

## 2011-02-23 LAB — COMPREHENSIVE METABOLIC PANEL
ALT: 102 U/L — ABNORMAL HIGH (ref 0–35)
ALT: 166 U/L — ABNORMAL HIGH (ref 0–35)
ALT: 198 U/L — ABNORMAL HIGH (ref 0–35)
ALT: 51 U/L — ABNORMAL HIGH (ref 0–35)
ALT: 67 U/L — ABNORMAL HIGH (ref 0–35)
ALT: 70 U/L — ABNORMAL HIGH (ref 0–35)
ALT: 92 U/L — ABNORMAL HIGH (ref 0–35)
AST: 109 U/L — ABNORMAL HIGH (ref 0–37)
AST: 233 U/L — ABNORMAL HIGH (ref 0–37)
AST: 263 U/L — ABNORMAL HIGH (ref 0–37)
AST: 40 U/L — ABNORMAL HIGH (ref 0–37)
AST: 61 U/L — ABNORMAL HIGH (ref 0–37)
AST: 62 U/L — ABNORMAL HIGH (ref 0–37)
AST: 78 U/L — ABNORMAL HIGH (ref 0–37)
Albumin: 1.8 g/dL — ABNORMAL LOW (ref 3.5–5.2)
Albumin: 1.9 g/dL — ABNORMAL LOW (ref 3.5–5.2)
Albumin: 1.9 g/dL — ABNORMAL LOW (ref 3.5–5.2)
Albumin: 2.1 g/dL — ABNORMAL LOW (ref 3.5–5.2)
Albumin: 2.5 g/dL — ABNORMAL LOW (ref 3.5–5.2)
Albumin: 2.6 g/dL — ABNORMAL LOW (ref 3.5–5.2)
Albumin: 2.6 g/dL — ABNORMAL LOW (ref 3.5–5.2)
Alkaline Phosphatase: 166 U/L — ABNORMAL HIGH (ref 39–117)
Alkaline Phosphatase: 247 U/L — ABNORMAL HIGH (ref 39–117)
Alkaline Phosphatase: 259 U/L — ABNORMAL HIGH (ref 39–117)
Alkaline Phosphatase: 41 U/L (ref 39–117)
Alkaline Phosphatase: 52 U/L (ref 39–117)
Alkaline Phosphatase: 54 U/L (ref 39–117)
Alkaline Phosphatase: 70 U/L (ref 39–117)
BUN: 10 mg/dL (ref 6–23)
BUN: 12 mg/dL (ref 6–23)
BUN: 15 mg/dL (ref 6–23)
BUN: 16 mg/dL (ref 6–23)
BUN: 16 mg/dL (ref 6–23)
BUN: 17 mg/dL (ref 6–23)
BUN: 9 mg/dL (ref 6–23)
CO2: 16 mEq/L — ABNORMAL LOW (ref 19–32)
CO2: 17 mEq/L — ABNORMAL LOW (ref 19–32)
CO2: 21 mEq/L (ref 19–32)
CO2: 25 mEq/L (ref 19–32)
CO2: 26 mEq/L (ref 19–32)
CO2: 27 mEq/L (ref 19–32)
CO2: 28 mEq/L (ref 19–32)
Calcium: 5.6 mg/dL — CL (ref 8.4–10.5)
Calcium: 7 mg/dL — ABNORMAL LOW (ref 8.4–10.5)
Calcium: 7.4 mg/dL — ABNORMAL LOW (ref 8.4–10.5)
Calcium: 7.8 mg/dL — ABNORMAL LOW (ref 8.4–10.5)
Calcium: 8.1 mg/dL — ABNORMAL LOW (ref 8.4–10.5)
Calcium: 8.6 mg/dL (ref 8.4–10.5)
Calcium: 8.8 mg/dL (ref 8.4–10.5)
Chloride: 104 mEq/L (ref 96–112)
Chloride: 105 mEq/L (ref 96–112)
Chloride: 107 mEq/L (ref 96–112)
Chloride: 109 mEq/L (ref 96–112)
Chloride: 110 mEq/L (ref 96–112)
Chloride: 112 mEq/L (ref 96–112)
Chloride: 115 mEq/L — ABNORMAL HIGH (ref 96–112)
Creatinine, Ser: 0.65 mg/dL (ref 0.4–1.2)
Creatinine, Ser: 0.66 mg/dL (ref 0.4–1.2)
Creatinine, Ser: 0.8 mg/dL (ref 0.4–1.2)
Creatinine, Ser: 0.8 mg/dL (ref 0.4–1.2)
Creatinine, Ser: 0.83 mg/dL (ref 0.4–1.2)
Creatinine, Ser: 0.93 mg/dL (ref 0.4–1.2)
Creatinine, Ser: 1.15 mg/dL (ref 0.4–1.2)
GFR calc Af Amer: 59 mL/min — ABNORMAL LOW (ref >60)
GFR calc Af Amer: >60 mL/min (ref >60)
GFR calc Af Amer: >60 mL/min (ref >60)
GFR calc Af Amer: >60 mL/min (ref >60)
GFR calc Af Amer: >60 mL/min (ref >60)
GFR calc Af Amer: >60 mL/min (ref >60)
GFR calc Af Amer: >60 mL/min (ref >60)
GFR calc non Af Amer: 49 mL/min — ABNORMAL LOW (ref >60)
GFR calc non Af Amer: >60 mL/min (ref >60)
GFR calc non Af Amer: >60 mL/min (ref >60)
GFR calc non Af Amer: >60 mL/min (ref >60)
GFR calc non Af Amer: >60 mL/min (ref >60)
GFR calc non Af Amer: >60 mL/min (ref >60)
GFR calc non Af Amer: >60 mL/min (ref >60)
Glucose, Bld: 101 mg/dL — ABNORMAL HIGH (ref 70–99)
Glucose, Bld: 119 mg/dL — ABNORMAL HIGH (ref 70–99)
Glucose, Bld: 136 mg/dL — ABNORMAL HIGH (ref 70–99)
Glucose, Bld: 306 mg/dL — ABNORMAL HIGH (ref 70–99)
Glucose, Bld: 330 mg/dL — ABNORMAL HIGH (ref 70–99)
Glucose, Bld: 96 mg/dL (ref 70–99)
Glucose, Bld: 97 mg/dL (ref 70–99)
Potassium: 2.6 mEq/L — CL (ref 3.5–5.1)
Potassium: 3.4 mEq/L — ABNORMAL LOW (ref 3.5–5.1)
Potassium: 3.4 mEq/L — ABNORMAL LOW (ref 3.5–5.1)
Potassium: 3.4 mEq/L — ABNORMAL LOW (ref 3.5–5.1)
Potassium: 3.6 mEq/L (ref 3.5–5.1)
Potassium: 3.6 mEq/L (ref 3.5–5.1)
Potassium: 3.9 mEq/L (ref 3.5–5.1)
Sodium: 138 mEq/L (ref 135–145)
Sodium: 138 mEq/L (ref 135–145)
Sodium: 139 mEq/L (ref 135–145)
Sodium: 139 mEq/L (ref 135–145)
Sodium: 140 mEq/L (ref 135–145)
Sodium: 142 mEq/L (ref 135–145)
Sodium: 143 mEq/L (ref 135–145)
Total Bilirubin: 0.6 mg/dL (ref 0.3–1.2)
Total Bilirubin: 0.7 mg/dL (ref 0.3–1.2)
Total Bilirubin: 0.7 mg/dL (ref 0.3–1.2)
Total Bilirubin: 0.7 mg/dL (ref 0.3–1.2)
Total Bilirubin: 0.8 mg/dL (ref 0.3–1.2)
Total Bilirubin: 1 mg/dL (ref 0.3–1.2)
Total Bilirubin: 1.1 mg/dL (ref 0.3–1.2)
Total Protein: 3.4 g/dL — ABNORMAL LOW (ref 6.0–8.3)
Total Protein: 4.1 g/dL — ABNORMAL LOW (ref 6.0–8.3)
Total Protein: 4.5 g/dL — ABNORMAL LOW (ref 6.0–8.3)
Total Protein: 4.6 g/dL — ABNORMAL LOW (ref 6.0–8.3)
Total Protein: 4.8 g/dL — ABNORMAL LOW (ref 6.0–8.3)
Total Protein: 6.3 g/dL (ref 6.0–8.3)
Total Protein: 6.4 g/dL (ref 6.0–8.3)

## 2011-02-23 LAB — CULTURE, BAL-QUANTITATIVE W GRAM STAIN

## 2011-02-23 LAB — POCT CARDIAC MARKERS
CKMB, poc: 33 ng/mL (ref 1.0–8.0)
Myoglobin, poc: 409 ng/mL (ref 12–200)
Troponin i, poc: 4.08 ng/mL (ref 0.00–0.09)

## 2011-02-23 LAB — URINALYSIS, ROUTINE W REFLEX MICROSCOPIC
Bilirubin Urine: NEGATIVE
Glucose, UA: NEGATIVE mg/dL
Glucose, UA: NEGATIVE mg/dL
Hgb urine dipstick: NEGATIVE
Ketones, ur: NEGATIVE mg/dL
Ketones, ur: NEGATIVE mg/dL
Nitrite: NEGATIVE
Nitrite: NEGATIVE
Protein, ur: 30 mg/dL — AB
Protein, ur: NEGATIVE mg/dL
Specific Gravity, Urine: 1.01 (ref 1.005–1.030)
Specific Gravity, Urine: 1.019 (ref 1.005–1.030)
Urobilinogen, UA: 0.2 mg/dL (ref 0.0–1.0)
Urobilinogen, UA: 4 mg/dL — ABNORMAL HIGH (ref 0.0–1.0)
pH: 6.5 (ref 5.0–8.0)
pH: 7.5 (ref 5.0–8.0)

## 2011-02-23 LAB — DIFFERENTIAL
Basophils Absolute: 0 10*3/uL (ref 0.0–0.1)
Basophils Absolute: 0 10*3/uL (ref 0.0–0.1)
Basophils Absolute: 0 10*3/uL (ref 0.0–0.1)
Basophils Absolute: 0 10*3/uL (ref 0.0–0.1)
Basophils Absolute: 0 10*3/uL (ref 0.0–0.1)
Basophils Absolute: 0.1 10*3/uL (ref 0.0–0.1)
Basophils Relative: 0 % (ref 0–1)
Basophils Relative: 0 % (ref 0–1)
Basophils Relative: 0 % (ref 0–1)
Basophils Relative: 0 % (ref 0–1)
Basophils Relative: 0 % (ref 0–1)
Basophils Relative: 0 % (ref 0–1)
Eosinophils Absolute: 0 10*3/uL (ref 0.0–0.7)
Eosinophils Absolute: 0 10*3/uL (ref 0.0–0.7)
Eosinophils Absolute: 0 10*3/uL (ref 0.0–0.7)
Eosinophils Absolute: 0.1 10*3/uL (ref 0.0–0.7)
Eosinophils Absolute: 0.2 10*3/uL (ref 0.0–0.7)
Eosinophils Absolute: 0.2 10*3/uL (ref 0.0–0.7)
Eosinophils Relative: 0 % (ref 0–5)
Eosinophils Relative: 0 % (ref 0–5)
Eosinophils Relative: 0 % (ref 0–5)
Eosinophils Relative: 1 % (ref 0–5)
Eosinophils Relative: 1 % (ref 0–5)
Eosinophils Relative: 1 % (ref 0–5)
Lymphocytes Relative: 10 % — ABNORMAL LOW (ref 12–46)
Lymphocytes Relative: 25 % (ref 12–46)
Lymphocytes Relative: 6 % — ABNORMAL LOW (ref 12–46)
Lymphocytes Relative: 6 % — ABNORMAL LOW (ref 12–46)
Lymphocytes Relative: 7 % — ABNORMAL LOW (ref 12–46)
Lymphocytes Relative: 7 % — ABNORMAL LOW (ref 12–46)
Lymphs Abs: 0.8 10*3/uL (ref 0.7–4.0)
Lymphs Abs: 1 10*3/uL (ref 0.7–4.0)
Lymphs Abs: 1.2 10*3/uL (ref 0.7–4.0)
Lymphs Abs: 1.4 10*3/uL (ref 0.7–4.0)
Lymphs Abs: 1.4 10*3/uL (ref 0.7–4.0)
Lymphs Abs: 4.9 10*3/uL — ABNORMAL HIGH (ref 0.7–4.0)
Monocytes Absolute: 0.9 10*3/uL (ref 0.1–1.0)
Monocytes Absolute: 0.9 10*3/uL (ref 0.1–1.0)
Monocytes Absolute: 0.9 10*3/uL (ref 0.1–1.0)
Monocytes Absolute: 1.1 10*3/uL — ABNORMAL HIGH (ref 0.1–1.0)
Monocytes Absolute: 1.2 10*3/uL — ABNORMAL HIGH (ref 0.1–1.0)
Monocytes Absolute: 1.5 10*3/uL — ABNORMAL HIGH (ref 0.1–1.0)
Monocytes Relative: 10 % (ref 3–12)
Monocytes Relative: 4 % (ref 3–12)
Monocytes Relative: 5 % (ref 3–12)
Monocytes Relative: 5 % (ref 3–12)
Monocytes Relative: 7 % (ref 3–12)
Monocytes Relative: 9 % (ref 3–12)
Neutro Abs: 10.1 10*3/uL — ABNORMAL HIGH (ref 1.7–7.7)
Neutro Abs: 11.2 10*3/uL — ABNORMAL HIGH (ref 1.7–7.7)
Neutro Abs: 13.1 10*3/uL — ABNORMAL HIGH (ref 1.7–7.7)
Neutro Abs: 14 10*3/uL — ABNORMAL HIGH (ref 1.7–7.7)
Neutro Abs: 18.6 10*3/uL — ABNORMAL HIGH (ref 1.7–7.7)
Neutro Abs: 18.8 10*3/uL — ABNORMAL HIGH (ref 1.7–7.7)
Neutrophils Relative %: 71 % (ref 43–77)
Neutrophils Relative %: 80 % — ABNORMAL HIGH (ref 43–77)
Neutrophils Relative %: 83 % — ABNORMAL HIGH (ref 43–77)
Neutrophils Relative %: 86 % — ABNORMAL HIGH (ref 43–77)
Neutrophils Relative %: 88 % — ABNORMAL HIGH (ref 43–77)
Neutrophils Relative %: 89 % — ABNORMAL HIGH (ref 43–77)

## 2011-02-23 LAB — CARDIAC PANEL(CRET KIN+CKTOT+MB+TROPI)
CK, MB: 160 ng/mL (ref 0.3–4.0)
CK, MB: 28.2 ng/mL (ref 0.3–4.0)
CK, MB: 371.3 ng/mL (ref 0.3–4.0)
CK, MB: 436.3 ng/mL (ref 0.3–4.0)
Relative Index: 3.3 — ABNORMAL HIGH (ref 0.0–2.5)
Relative Index: 8.7 — ABNORMAL HIGH (ref 0.0–2.5)
Relative Index: 9.7 — ABNORMAL HIGH (ref 0.0–2.5)
Relative Index: 9.9 — ABNORMAL HIGH (ref 0.0–2.5)
Total CK: 1650 U/L — ABNORMAL HIGH (ref 7–177)
Total CK: 4287 U/L — ABNORMAL HIGH (ref 7–177)
Total CK: 4429 U/L — ABNORMAL HIGH (ref 7–177)
Total CK: 848 U/L — ABNORMAL HIGH (ref 7–177)
Troponin I: 25.21 ng/mL (ref 0.00–0.06)
Troponin I: 40.77 ng/mL (ref 0.00–0.06)
Troponin I: 44.82 ng/mL (ref 0.00–0.06)
Troponin I: 79.59 ng/mL (ref 0.00–0.06)

## 2011-02-23 LAB — CROSSMATCH
ABO/RH(D): A POS
Antibody Screen: NEGATIVE

## 2011-02-23 LAB — PREPARE RBC (CROSSMATCH)

## 2011-02-23 LAB — SAMPLE TO BLOOD BANK

## 2011-02-23 LAB — URINE CULTURE
Colony Count: NO GROWTH
Colony Count: NO GROWTH
Culture: NO GROWTH
Culture: NO GROWTH

## 2011-02-23 LAB — POCT I-STAT 3, ART BLOOD GAS (G3+)
Acid-base deficit: 11 mmol/L — ABNORMAL HIGH (ref 0.0–2.0)
Acid-base deficit: 18 mmol/L — ABNORMAL HIGH (ref 0.0–2.0)
Bicarbonate: 15.2 mEq/L — ABNORMAL LOW (ref 20.0–24.0)
Bicarbonate: 9.9 mEq/L — ABNORMAL LOW (ref 20.0–24.0)
O2 Saturation: 80 %
O2 Saturation: 81 %
TCO2: 11 mmol/L (ref 0–100)
TCO2: 16 mmol/L (ref 0–100)
pCO2 arterial: 30.2 mmHg — ABNORMAL LOW (ref 35.0–45.0)
pCO2 arterial: 35.5 mmHg (ref 35.0–45.0)
pH, Arterial: 7.123 — CL (ref 7.350–7.400)
pH, Arterial: 7.24 — ABNORMAL LOW (ref 7.350–7.400)
pO2, Arterial: 53 mmHg — ABNORMAL LOW (ref 80.0–100.0)
pO2, Arterial: 57 mmHg — ABNORMAL LOW (ref 80.0–100.0)

## 2011-02-23 LAB — CALCIUM, IONIZED: Calcium, Ion: 0.91 mmol/L — ABNORMAL LOW (ref 1.12–1.32)

## 2011-02-23 LAB — BRAIN NATRIURETIC PEPTIDE
Pro B Natriuretic peptide (BNP): 307 pg/mL — ABNORMAL HIGH (ref 0.0–100.0)
Pro B Natriuretic peptide (BNP): 339 pg/mL — ABNORMAL HIGH (ref 0.0–100.0)
Pro B Natriuretic peptide (BNP): 421 pg/mL — ABNORMAL HIGH (ref 0.0–100.0)
Pro B Natriuretic peptide (BNP): 480 pg/mL — ABNORMAL HIGH (ref 0.0–100.0)
Pro B Natriuretic peptide (BNP): 507 pg/mL — ABNORMAL HIGH (ref 0.0–100.0)
Pro B Natriuretic peptide (BNP): 555 pg/mL — ABNORMAL HIGH (ref 0.0–100.0)

## 2011-02-23 LAB — PHOSPHORUS
Phosphorus: 2.6 mg/dL (ref 2.3–4.6)
Phosphorus: 3.6 mg/dL (ref 2.3–4.6)
Phosphorus: 3.9 mg/dL (ref 2.3–4.6)
Phosphorus: 4.1 mg/dL (ref 2.3–4.6)
Phosphorus: 4.2 mg/dL (ref 2.3–4.6)
Phosphorus: 4.3 mg/dL (ref 2.3–4.6)

## 2011-02-23 LAB — URINE MICROSCOPIC-ADD ON

## 2011-02-23 LAB — CULTURE, BAL-QUANTITATIVE: Colony Count: >=100000

## 2011-02-23 LAB — ABO/RH: ABO/RH(D): A POS

## 2011-02-23 LAB — HEMOGLOBIN A1C
Hgb A1c MFr Bld: 5.7 % (ref 4.6–6.1)
Hgb A1c MFr Bld: 5.8 % (ref 4.6–6.1)
Mean Plasma Glucose: 117 mg/dL
Mean Plasma Glucose: 120 mg/dL

## 2011-02-23 LAB — HEMOGLOBIN AND HEMATOCRIT, BLOOD
HCT: 22.6 % — ABNORMAL LOW (ref 36.0–46.0)
Hemoglobin: 7.9 g/dL — ABNORMAL LOW (ref 12.0–15.0)

## 2011-02-23 LAB — MAGNESIUM
Magnesium: 1.5 mg/dL (ref 1.5–2.5)
Magnesium: 1.5 mg/dL (ref 1.5–2.5)
Magnesium: 1.6 mg/dL (ref 1.5–2.5)
Magnesium: 1.7 mg/dL (ref 1.5–2.5)
Magnesium: 1.9 mg/dL (ref 1.5–2.5)
Magnesium: 1.9 mg/dL (ref 1.5–2.5)
Magnesium: 2.1 mg/dL (ref 1.5–2.5)
Magnesium: 2.2 mg/dL (ref 1.5–2.5)
Magnesium: 2.2 mg/dL (ref 1.5–2.5)

## 2011-02-23 LAB — LIPID PANEL
Cholesterol: 121 mg/dL (ref 0–200)
HDL: 23 mg/dL — ABNORMAL LOW (ref >39)
LDL Cholesterol: 81 mg/dL (ref 0–99)
Total CHOL/HDL Ratio: 5.3 RATIO
Triglycerides: 83 mg/dL (ref <150)
VLDL: 17 mg/dL (ref 0–40)

## 2011-02-23 LAB — LACTIC ACID, PLASMA
Lactic Acid, Venous: 0.7 mmol/L (ref 0.5–2.2)
Lactic Acid, Venous: 0.8 mmol/L (ref 0.5–2.2)
Lactic Acid, Venous: 1.2 mmol/L (ref 0.5–2.2)
Lactic Acid, Venous: 10.7 mmol/L — ABNORMAL HIGH (ref 0.5–2.2)
Lactic Acid, Venous: 4.8 mmol/L — ABNORMAL HIGH (ref 0.5–2.2)
Lactic Acid, Venous: 6 mmol/L — ABNORMAL HIGH (ref 0.5–2.2)

## 2011-02-23 LAB — MRSA PCR SCREENING: MRSA by PCR: NEGATIVE

## 2011-02-23 LAB — TSH: TSH: 0.938 u[IU]/mL (ref 0.350–4.500)

## 2011-03-10 ENCOUNTER — Ambulatory Visit (INDEPENDENT_AMBULATORY_CARE_PROVIDER_SITE_OTHER): Payer: 59 | Admitting: Family Medicine

## 2011-03-10 ENCOUNTER — Encounter: Payer: Self-pay | Admitting: Family Medicine

## 2011-03-10 VITALS — BP 124/84 | HR 74 | Temp 98.3°F | Wt 166.2 lb

## 2011-03-10 DIAGNOSIS — J329 Chronic sinusitis, unspecified: Secondary | ICD-10-CM

## 2011-03-10 DIAGNOSIS — J019 Acute sinusitis, unspecified: Secondary | ICD-10-CM

## 2011-03-10 MED ORDER — PREDNISONE (PAK) 10 MG PO TABS: 10.0000 mg | ORAL_TABLET | Freq: Every day | ORAL | Status: AC

## 2011-03-10 MED ORDER — CLARITHROMYCIN ER 500 MG PO TB24: 1000.0000 mg | ORAL_TABLET | Freq: Every day | ORAL | Status: DC

## 2011-03-10 NOTE — Assessment & Plan Note (Signed)
Pt's sxs are consistent w/ severe seasonal allergies and now R maxillary sinus infxn.  Start Biaxin.  Prednisone for sinus inflammation.  Reviewed supportive care and red flags that should prompt return.  Pt expressed understanding and is in agreement w/ plan.

## 2011-03-10 NOTE — Patient Instructions (Signed)
You have a sinus infection on top of your seasonal allergies Take the Pred Pack as directed on the package Start the Biaxin for the infection Continue the Zyrtec and Nasonex daily Call with any questions or concerns Hang in there!

## 2011-03-10 NOTE — Progress Notes (Signed)
  Subjective:    Patient ID: Vicki [name], female    DOB: June 07, 1956, 55 y.o.   MRN: [id number]  HPI Seasonal allergies- only occur in the spring.  Itchy, watery eyes, + sneezing, nasal congestion, ear fullness, sinus pressure.  Using OTC meds w/out relief.  Taking zyrtec regularly and using nasonex  R sided facial pain- started yesterday, + tooth pain, eye pain.  No fevers.   Review of Systems For ROS see HPI     Objective:   Physical Exam  Constitutional: She appears well-developed and well-nourished. No distress.  HENT:  Head: Normocephalic and atraumatic.  Right Ear: Tympanic membrane normal.  Left Ear: Tympanic membrane normal.  Nose: Mucosal edema and rhinorrhea present. Right sinus exhibits maxillary sinus tenderness. Right sinus exhibits no frontal sinus tenderness. Left sinus exhibits no maxillary sinus tenderness and no frontal sinus tenderness.  Mouth/Throat: Uvula is midline and mucous membranes are normal. Posterior oropharyngeal erythema present. No oropharyngeal exudate.  Eyes: Conjunctivae and EOM are normal. Pupils are equal, round, and reactive to light.  Neck: Normal range of motion. Neck supple.  Cardiovascular: Normal rate, regular rhythm and normal heart sounds.   Pulmonary/Chest: Effort normal and breath sounds normal. No respiratory distress. She has no wheezes.  Lymphadenopathy:    She has no cervical adenopathy.          Assessment & Plan:

## 2011-06-30 ENCOUNTER — Other Ambulatory Visit: Payer: Self-pay | Admitting: Family Medicine

## 2011-06-30 MED ORDER — ALPRAZOLAM 0.5 MG PO TABS: 0.5000 mg | ORAL_TABLET | Freq: Every evening | ORAL | Status: DC | PRN

## 2011-06-30 NOTE — Telephone Encounter (Signed)
Ok for #30, 6 refills 

## 2011-06-30 NOTE — Telephone Encounter (Signed)
Done. 5 Refills allowed controlled drugs

## 2011-06-30 NOTE — Telephone Encounter (Signed)
Last rx written 11/28/2010 w/ 6 refills. Last OV 03/10/11

## 2011-08-19 ENCOUNTER — Encounter: Payer: Self-pay | Admitting: Family Medicine

## 2011-08-19 ENCOUNTER — Ambulatory Visit (INDEPENDENT_AMBULATORY_CARE_PROVIDER_SITE_OTHER): Payer: 59 | Admitting: Family Medicine

## 2011-08-19 VITALS — BP 112/80 | Temp 97.8°F | Wt 170.0 lb

## 2011-08-19 DIAGNOSIS — M7750 Other enthesopathy of unspecified foot: Secondary | ICD-10-CM

## 2011-08-19 MED ORDER — NAPROXEN 500 MG PO TABS: 500.0000 mg | ORAL_TABLET | Freq: Two times a day (BID) | ORAL | Status: DC

## 2011-08-19 MED ORDER — NITROGLYCERIN 0.4 MG SL SUBL: 0.4000 mg | SUBLINGUAL_TABLET | SUBLINGUAL | Status: DC | PRN

## 2011-08-19 NOTE — Progress Notes (Signed)
  Subjective:    Patient ID: Vicki [name], female    DOB: 04/04/1956, 55 y.o.   MRN: [id number]  HPI Edema- 3 days of sxs, did a lot of walking and standing the previous week at a conference.  R foot swelling along malleoli.  No swelling of toes or lower leg.  No redness.  + pain, worse w/ walking.  Wore a lot of flats and sandals while standing and walking.   Review of Systems For ROS see HPI     Objective:   Physical Exam  Vitals reviewed. Constitutional: She appears well-developed and well-nourished. No distress.  Cardiovascular: Intact distal pulses.   Musculoskeletal: She exhibits no edema.       No current edema today but pt indicates swelling occurs at bursa at both medial and lateral malleoli on R w/ standing or walking.  + TTP when swollen.          Assessment & Plan:

## 2011-08-19 NOTE — Patient Instructions (Signed)
This is bursitis of the ankle Take the Naprosyn twice daily for 5 days (with food to avoid upset stomach) and then as needed ICE! Wear supportive shoes until the pain improves Call with any questions or concerns

## 2011-08-19 NOTE — Assessment & Plan Note (Signed)
No evidence of DVT or current edema.  Start short term NSAIDs, elevate, ice.  Reviewed supportive care and red flags that should prompt return.  Pt expressed understanding and is in agreement w/ plan.

## 2011-09-02 ENCOUNTER — Other Ambulatory Visit: Payer: Self-pay | Admitting: Family Medicine

## 2011-09-02 DIAGNOSIS — Z1231 Encounter for screening mammogram for malignant neoplasm of breast: Secondary | ICD-10-CM

## 2011-09-10 ENCOUNTER — Ambulatory Visit
Admission: RE | Admit: 2011-09-10 | Discharge: 2011-09-10 | Disposition: A | Discharge status: Home / Self Care | Payer: 59 | Source: Ambulatory Visit | Attending: Family Medicine | Admitting: Family Medicine

## 2011-09-10 DIAGNOSIS — Z1231 Encounter for screening mammogram for malignant neoplasm of breast: Secondary | ICD-10-CM

## 2011-09-28 ENCOUNTER — Encounter: Payer: Self-pay | Admitting: Family Medicine

## 2011-09-28 ENCOUNTER — Ambulatory Visit (INDEPENDENT_AMBULATORY_CARE_PROVIDER_SITE_OTHER): Payer: 59 | Admitting: Family Medicine

## 2011-09-28 VITALS — BP 115/65 | HR 70 | Temp 98.6°F | Ht 67.0 in | Wt 171.6 lb

## 2011-09-28 DIAGNOSIS — J40 Bronchitis, not specified as acute or chronic: Secondary | ICD-10-CM

## 2011-09-28 MED ORDER — GUAIFENESIN-CODEINE 100-10 MG/5ML PO SYRP: 10.0000 mL | ORAL_SOLUTION | Freq: Three times a day (TID) | ORAL | Status: DC | PRN

## 2011-09-28 MED ORDER — AZITHROMYCIN 250 MG PO TABS: 250.0000 mg | ORAL_TABLET | Freq: Every day | ORAL | Status: AC

## 2011-09-28 MED ORDER — BENZONATATE 200 MG PO CAPS: 200.0000 mg | ORAL_CAPSULE | Freq: Three times a day (TID) | ORAL | Status: AC | PRN

## 2011-09-28 NOTE — Assessment & Plan Note (Signed)
Pt's sxs and PE consistent w/ bronchitis.  Start zpack.  Cough meds prn.  Reviewed supportive care and red flags that should prompt return.  Pt expressed understanding and is in agreement w/ plan.

## 2011-09-28 NOTE — Progress Notes (Signed)
  Subjective:    Patient ID: Vicki [name], female    DOB: May 13, 1956, 55 y.o.   MRN: [id number]  HPI Congestion- family recent sick, sxs started over a week ago, 'it's not going anywhere'.  Saturday woke up w/ severe muscle aches.  Denies increased activity.  + cough- intermittently productive.  Denies sinus pain.  + nasal congestion.  No ear pain.  No fevers.   Review of Systems For ROS see HPI     Objective:   Physical Exam  Vitals reviewed. Constitutional: She appears well-developed and well-nourished. No distress.  HENT:  Head: Normocephalic and atraumatic.       TMs normal bilaterally Mild nasal congestion Throat w/out erythema, edema, or exudate  Eyes: Conjunctivae and EOM are normal. Pupils are equal, round, and reactive to light.  Neck: Normal range of motion. Neck supple.  Cardiovascular: Normal rate, regular rhythm, normal heart sounds and intact distal pulses.   No murmur heard. Pulmonary/Chest: Effort normal and breath sounds normal. No respiratory distress. She has no wheezes.       + hacking cough  Lymphadenopathy:    She has no cervical adenopathy.          Assessment & Plan:

## 2011-09-28 NOTE — Patient Instructions (Signed)
Take the Azithromycin as directed for the bronchitis Use the tessalon and cough syrup for cough- the syrup will make you drowsy Add Mucinex to thin your congestion If no improvement in the body aches as your illness improves- let me know Alternate tylenol and ibuprofen for pain or fever REST! Hang in there!!!

## 2011-12-15 ENCOUNTER — Telehealth: Payer: Self-pay | Admitting: Family Medicine

## 2011-12-15 NOTE — Telephone Encounter (Signed)
Spoke to pt and advised we had savings cards for crestor and samples for crestor for her as well, I advised I will check with other staff about savings cards for plavix and zetia next business day per unable to locate at this time and no other staff here currently to ask. I will call pt back tommorow, pt understood and gave me contact cell number [phone]

## 2011-12-15 NOTE — Telephone Encounter (Signed)
Patient states that her medicines were over $200 when she picked them up.(crestor and plavix). She wants to know if we have any sample cards? To give to the pharmacy so she can get meds at a cheaper rate. She states that last year she and her husband had a sample card and her presciptions were only $37 a piece.

## 2011-12-17 NOTE — Telephone Encounter (Signed)
Called pt to advise she needs to speak to Cards MD Dr Allyson Sabal about switching th plavix.pt understood

## 2011-12-17 NOTE — Telephone Encounter (Signed)
Will need to check w/ Dr Allyson Sabal (cards) about switching plavix

## 2011-12-17 NOTE — Telephone Encounter (Signed)
Called pt to advise we no longer have the plavix samples, she wanted to ask MD Tabori's opinion changing to the generic plavix per too expensive and she remembered at one time MD Tabori, did not want her to change to the generic of plavix

## 2012-01-21 ENCOUNTER — Other Ambulatory Visit: Payer: Self-pay | Admitting: Family Medicine

## 2012-01-21 NOTE — Telephone Encounter (Signed)
Last OV 09-28-11 last refill 06-30-11 #30 with 5 refills

## 2012-01-21 NOTE — Telephone Encounter (Signed)
Ok for #30, 3 refills.  Please remind her to schedule CPE.

## 2012-01-22 MED ORDER — ALPRAZOLAM 0.5 MG PO TABS: 0.5000 mg | ORAL_TABLET | Freq: Every evening | ORAL | Status: DC | PRN

## 2012-01-22 NOTE — Telephone Encounter (Signed)
.  rx faxed to pharmacy, manually. Letter has been mailed to pt address noted in the chart to advise they are overdue for cpe/ov/labs and the pt needs to contact office to set up appt

## 2012-01-25 ENCOUNTER — Encounter: Payer: Self-pay | Admitting: Family Medicine

## 2012-01-25 ENCOUNTER — Ambulatory Visit (INDEPENDENT_AMBULATORY_CARE_PROVIDER_SITE_OTHER): Payer: 59 | Admitting: Family Medicine

## 2012-01-25 ENCOUNTER — Ambulatory Visit: Payer: 59 | Admitting: Family Medicine

## 2012-01-25 DIAGNOSIS — K130 Diseases of lips: Secondary | ICD-10-CM

## 2012-01-25 DIAGNOSIS — R220 Localized swelling, mass and lump, head: Secondary | ICD-10-CM

## 2012-01-25 DIAGNOSIS — J329 Chronic sinusitis, unspecified: Secondary | ICD-10-CM

## 2012-01-25 DIAGNOSIS — J019 Acute sinusitis, unspecified: Secondary | ICD-10-CM

## 2012-01-25 DIAGNOSIS — J209 Acute bronchitis, unspecified: Secondary | ICD-10-CM

## 2012-01-25 MED ORDER — ALBUTEROL SULFATE HFA 108 (90 BASE) MCG/ACT IN AERS: 2.0000 | INHALATION_SPRAY | RESPIRATORY_TRACT | Status: DC | PRN

## 2012-01-25 MED ORDER — CLARITHROMYCIN ER 500 MG PO TB24: 1000.0000 mg | ORAL_TABLET | Freq: Every day | ORAL | Status: AC

## 2012-01-25 NOTE — Assessment & Plan Note (Signed)
New.  No obvious abnormality of PE.  Pt on Lisinopril- must consider angioedema if sxs continue although it is strange it is only lower lip and only at night.  Timing corresponds to current illness.  Pt to track sxs and call if they change or worsen.  Pt expressed understanding and is in agreement w/ plan.

## 2012-01-25 NOTE — Assessment & Plan Note (Signed)
New.  Start abx and inhaler.  Reviewed supportive care and red flags that should prompt return.  Pt expressed understanding and is in agreement w/ plan.

## 2012-01-25 NOTE — Assessment & Plan Note (Signed)
Pt's PE consistent w/ infxn.  Start abx.  Reviewed supportive care and red flags that should prompt return.  Pt expressed understanding and is in agreement w/ plan.  

## 2012-01-25 NOTE — Patient Instructions (Signed)
Schedule your complete physical at your convenience This is a sinus infection and a bronchitis Start the Biaxin- 2 pills at the same time daily- w/ food Drink plenty of fluids Use the mucinex to thin your congestion Use the cough meds as needed REST! If your lip continues to swell- please call me! Hang in there!

## 2012-01-25 NOTE — Progress Notes (Signed)
  Subjective:    Patient ID: Vicki [name], female    DOB: May 09, 1956, 56 y.o.   MRN: [id number]  HPI URI- sxs started 3 weeks ago.  sxs started w/ sore throat, sinus pressure.  Sinuses have since improved but chest congestion remains.  mucinex w/out relief.  Started left over cough syrup w/ some relief.  Cough is dry, not productive.  + sick contacts.  Lower lip swelling- sxs started 3-4 weeks ago (prior to illness), only at night.  Denies change in facial products, lip sticks, detergents, meds.  Denies snoring.   Review of Systems For ROS see HPI     Objective:   Physical Exam  Vitals reviewed. Constitutional: She appears well-developed and well-nourished. No distress.  HENT:  Head: Normocephalic and atraumatic.  Right Ear: Tympanic membrane normal.  Left Ear: Tympanic membrane normal.  Nose: Mucosal edema and rhinorrhea present. Right sinus exhibits maxillary sinus tenderness and frontal sinus tenderness. Left sinus exhibits maxillary sinus tenderness and frontal sinus tenderness.  Mouth/Throat: Uvula is midline and mucous membranes are normal. Posterior oropharyngeal erythema present. No oropharyngeal exudate.       No obvious lip swelling, no swelling of tongue or soft palate  Eyes: Conjunctivae and EOM are normal. Pupils are equal, round, and reactive to light.  Neck: Normal range of motion. Neck supple.  Cardiovascular: Normal rate, regular rhythm and normal heart sounds.   Pulmonary/Chest: Effort normal. No respiratory distress. She has wheezes. She has no rales.       Dry hacking cough Expiratory wheezes diffusely throughout  Lymphadenopathy:    She has no cervical adenopathy.          Assessment & Plan:

## 2012-03-10 ENCOUNTER — Encounter: Payer: 59 | Admitting: Family Medicine

## 2012-03-25 ENCOUNTER — Encounter: Payer: Self-pay | Admitting: Family Medicine

## 2012-03-25 ENCOUNTER — Ambulatory Visit (INDEPENDENT_AMBULATORY_CARE_PROVIDER_SITE_OTHER): Payer: 59 | Admitting: Family Medicine

## 2012-03-25 VITALS — BP 126/76 | HR 68 | Temp 98.2°F | Wt 169.6 lb

## 2012-03-25 DIAGNOSIS — J309 Allergic rhinitis, unspecified: Secondary | ICD-10-CM

## 2012-03-25 DIAGNOSIS — J302 Other seasonal allergic rhinitis: Secondary | ICD-10-CM

## 2012-03-25 MED ORDER — METHYLPREDNISOLONE ACETATE 80 MG/ML IJ SUSP
80.0000 mg | Freq: Once | INTRAMUSCULAR | Status: AC
  Administered 2012-03-25: 80 mg via INTRAMUSCULAR

## 2012-03-25 MED ORDER — MOMETASONE FUROATE 50 MCG/ACT NA SUSP: 2.0000 | Freq: Every day | NASAL | Status: DC

## 2012-03-25 NOTE — Patient Instructions (Signed)
Schedule your complete physical at your convenience Restart the Nasonex- 2 sprays each nostril daily Continue the Zyrtec Call with any questions or concerns Hang in there!

## 2012-03-25 NOTE — Progress Notes (Signed)
  Subjective:    Patient ID: Vicki [name], female    DOB: 1956/07/20, 56 y.o.   MRN: [id number]  HPI Seasonal allergies- pt reports she comes every spring and 'if i get a shot i can stop them'.  'can't breathe, itchy eyes, itchy throat, nasal congestion, sneezing.  Taking Zyrtec year round.  Using nasal spray but ran out- was using Nasonex.     Review of Systems For ROS see HPI     Objective:   Physical Exam  Vitals reviewed. Constitutional: She appears well-developed and well-nourished. No distress.  HENT:  Head: Normocephalic and atraumatic.  Right Ear: Tympanic membrane normal.  Left Ear: Tympanic membrane normal.  Nose: Mucosal edema and rhinorrhea present. Right sinus exhibits no maxillary sinus tenderness and no frontal sinus tenderness. Left sinus exhibits no maxillary sinus tenderness and no frontal sinus tenderness.  Mouth/Throat: Mucous membranes are normal. Posterior oropharyngeal erythema (w/ PND) present.  Eyes: Conjunctivae and EOM are normal. Pupils are equal, round, and reactive to light.  Neck: Normal range of motion. Neck supple.  Cardiovascular: Normal rate, regular rhythm and normal heart sounds.   Pulmonary/Chest: Effort normal and breath sounds normal. No respiratory distress. She has no wheezes. She has no rales.  Lymphadenopathy:    She has no cervical adenopathy.          Assessment & Plan:

## 2012-03-25 NOTE — Assessment & Plan Note (Signed)
Deteriorated.  depomedrol to control acute sxs.  Restart Nasonex.  Sample and coupon given.  Reviewed supportive care and red flags that should prompt return.  Pt expressed understanding and is in agreement w/ plan.

## 2012-04-06 ENCOUNTER — Other Ambulatory Visit: Payer: Self-pay | Admitting: Family Medicine

## 2012-04-06 NOTE — Telephone Encounter (Signed)
refill metoprolol tartrate 50mg  tablets Take one tablet by mouth twice daily  Qty 60 Last filled 03.31.2013 Last OV 4.26.13

## 2012-04-07 MED ORDER — METOPROLOL TARTRATE 50 MG PO TABS: 50.0000 mg | ORAL_TABLET | Freq: Two times a day (BID) | ORAL | Status: DC

## 2012-04-07 NOTE — Telephone Encounter (Signed)
rx sent to pharmacy by e-script  

## 2012-04-30 ENCOUNTER — Ambulatory Visit (INDEPENDENT_AMBULATORY_CARE_PROVIDER_SITE_OTHER): Payer: 59 | Admitting: Family Medicine

## 2012-04-30 ENCOUNTER — Encounter: Payer: Self-pay | Admitting: Family Medicine

## 2012-04-30 VITALS — BP 120/78 | HR 73 | Temp 98.3°F | Wt 167.0 lb

## 2012-04-30 DIAGNOSIS — J40 Bronchitis, not specified as acute or chronic: Secondary | ICD-10-CM

## 2012-04-30 MED ORDER — AZITHROMYCIN 250 MG PO TABS: ORAL_TABLET | ORAL | Status: DC

## 2012-04-30 NOTE — Patient Instructions (Signed)
Mucinex Dm during the day, Cheratussin at night for cough.  Push fluids. Can use nasal saline irrigation for congestion.  If not improving in next 4-5 days can fill the prescription for the antibiotic.

## 2012-04-30 NOTE — Assessment & Plan Note (Signed)
Likely viral. Treat symptomatically, but given smoking history if not improving can fill rx for antibiotic.

## 2012-04-30 NOTE — Progress Notes (Signed)
  Subjective:    Patient ID: Vicki [name], female    DOB: 07-Feb-1956, 56 y.o.   MRN: [id number]  Cough This is a new problem. The current episode started in the past 7 days (4-5 days ago). The problem has been gradually worsening. The problem occurs every few minutes. The cough is productive of bloody sputum. Associated symptoms include ear pain, myalgias, nasal congestion, postnasal drip, rhinorrhea and a sore throat. Pertinent negatives include no chills, fever, headaches, shortness of breath or wheezing. The symptoms are aggravated by fumes (1 week ago she was around [employer] race). Treatments tried: tylenol, robitussin, salt water gargle. The treatment provided mild relief. Her past medical history is significant for bronchitis and environmental allergies. There is no history of asthma, COPD or emphysema. former smoker      Review of Systems  Constitutional: Negative for fever and chills.  HENT: Positive for ear pain, sore throat, rhinorrhea and postnasal drip.   Respiratory: Positive for cough. Negative for shortness of breath and wheezing.   Musculoskeletal: Positive for myalgias.  Neurological: Negative for headaches.  Hematological: Positive for environmental allergies.       Objective:   Physical Exam  Constitutional: Vital signs are normal. She appears well-developed and well-nourished. She is cooperative.  Non-toxic appearance. She does not appear ill. No distress.  HENT:  Head: Normocephalic.  Right Ear: Hearing, tympanic membrane, external ear and ear canal normal. Tympanic membrane is not erythematous, not retracted and not bulging.  Left Ear: Hearing, tympanic membrane, external ear and ear canal normal. Tympanic membrane is not erythematous, not retracted and not bulging.  Nose: Mucosal edema and rhinorrhea present. Right sinus exhibits no maxillary sinus tenderness and no frontal sinus tenderness. Left sinus exhibits no maxillary sinus tenderness and no frontal sinus  tenderness.  Mouth/Throat: Uvula is midline and mucous membranes are normal. Posterior oropharyngeal erythema present. No oropharyngeal exudate.  Eyes: Conjunctivae, EOM and lids are normal. Pupils are equal, round, and reactive to light. No foreign bodies found.  Neck: Trachea normal and normal range of motion. Neck supple. Carotid bruit is not present. No mass and no thyromegaly present.  Cardiovascular: Normal rate, regular rhythm, S1 normal, S2 normal, normal heart sounds, intact distal pulses and normal pulses.  Exam reveals no gallop and no friction rub.   No murmur heard. Pulmonary/Chest: Effort normal and breath sounds normal. Not tachypneic. No respiratory distress. She has no decreased breath sounds. She has no wheezes. She has no rhonchi. She has no rales.  Neurological: She is alert.  Skin: Skin is warm, dry and intact. No rash noted.  Psychiatric: Her speech is normal and behavior is normal. Judgment normal. Her mood appears not anxious. Cognition and memory are normal. She does not exhibit a depressed mood.          Assessment & Plan:

## 2012-05-02 ENCOUNTER — Ambulatory Visit (INDEPENDENT_AMBULATORY_CARE_PROVIDER_SITE_OTHER): Payer: 59 | Admitting: Family Medicine

## 2012-05-02 ENCOUNTER — Encounter: Payer: Self-pay | Admitting: Family Medicine

## 2012-05-02 ENCOUNTER — Telehealth: Payer: Self-pay | Admitting: *Deleted

## 2012-05-02 VITALS — BP 125/85 | HR 70 | Temp 98.8°F | Ht 67.0 in | Wt 169.8 lb

## 2012-05-02 DIAGNOSIS — R059 Cough, unspecified: Secondary | ICD-10-CM

## 2012-05-02 DIAGNOSIS — R05 Cough: Secondary | ICD-10-CM

## 2012-05-02 DIAGNOSIS — J209 Acute bronchitis, unspecified: Secondary | ICD-10-CM

## 2012-05-02 MED ORDER — IPRATROPIUM BROMIDE 0.02 % IN SOLN
0.5000 mg | Freq: Once | RESPIRATORY_TRACT | Status: AC
  Administered 2012-05-02: 0.5 mg via RESPIRATORY_TRACT

## 2012-05-02 MED ORDER — ALBUTEROL SULFATE HFA 108 (90 BASE) MCG/ACT IN AERS: 2.0000 | INHALATION_SPRAY | RESPIRATORY_TRACT | Status: DC | PRN

## 2012-05-02 MED ORDER — ALBUTEROL SULFATE (5 MG/ML) 0.5% IN NEBU
2.5000 mg | INHALATION_SOLUTION | Freq: Once | RESPIRATORY_TRACT | Status: AC
  Administered 2012-05-02: 2.5 mg via RESPIRATORY_TRACT

## 2012-05-02 MED ORDER — AZITHROMYCIN 250 MG PO TABS: ORAL_TABLET | ORAL | Status: DC

## 2012-05-02 NOTE — Telephone Encounter (Signed)
PT seen at Saturday clinic.

## 2012-05-02 NOTE — Patient Instructions (Signed)
Start the Zpack as directed Use the inhaler- 2 puffs every 4 hrs- as needed for cough or shortness of breath Continue your cough meds REST! Hang in there!!!

## 2012-05-02 NOTE — Telephone Encounter (Signed)
[facility] Triage Call Report Triage Record Num: [id number] Operator: Terance Ice Patient Name: Vicki [name] Call Date & Time: 04/30/2012 8:19:37AM Patient Phone: [phone] PCP: Lezlie Octave Patient Gender: Female PCP Fax : [phone] Patient DOB: 03/03/1956 Practice Name: [facility] Reason for Call: Caller: [name]/Patient; PCP: Sheliah Hatch.; CB#: [phone]; Call regarding Sore Throat onset 04/28/12; denies fever, has chills and aches, cough, congestion, raw throat and right tonsil area looks swollen and red with white patches, spitting up blood on am of 04/29/12 but resolved on 04/30/12; Guideline: Sore Throat or Hoarseness; Disp: see provider within 24 hours; Appt? yes with Dr. Ermalene Searing at 1045 at [facility] [address] location on 04/30/12. Protocol(s) Used: Sore Throat or Hoarseness Recommended Outcome per Protocol: See Provider within 24 hours Reason for Outcome: Has enlarged tonsils covered by yellow-white patches Care Advice: ~ Use a cool mist humidifier to moisten air. Be sure to clean according to manufacturer's instructions. ~ SYMPTOM / CONDITION MANAGEMENT Analgesic/Antipyretic Advice - Acetaminophen: Consider acetaminophen as directed on label or by pharmacist/provider for pain or fever PRECAUTIONS: - Use if there is no history of liver disease, alcoholism, or intake of three or more alcohol drinks per day - Only if approved by provider during pregnancy or when breastfeeding - During pregnancy, acetaminophen should not be taken more than 3 consecutive days without telling provider - Do not exceed recommended dose or frequency ~ Sore Throat Relief: - Use warm salt water gargles 3 to 4 times/day, as needed (1/2 tsp. salt in 8 oz. [.2 liters] water). - Suck on hard candy, nonprescription or herbal throat lozenges (sugar-free if diabetic) - Eat soothing, soft food/fluids (broths, soups, or honey and lemon juice in hot tea, Popsicles, frozen yogurt or sherbet, scrambled  eggs, cooked cereals, Jell-O or puddings) whichever is most comforting. - Avoid eating salty, spicy or acidic foods. ~ 04/30/2012 8:43:38AM

## 2012-05-02 NOTE — Progress Notes (Signed)
  Subjective:    Patient ID: Vicki [name], female    DOB: 1956/05/30, 56 y.o.   MRN: [id number]  HPI Bronchitis- seen on Saturday at [facility] and told sxs were viral.  Was told to take tylenol and mucinex, rest.  Has not been out of bed since.  Pt's sxs are not improving.  'i feel terrible'.  Has not coughed up blood since Friday.  Cough is intermittently productive.  'i ache all over like i have the flu but no fever'.  Yesterday L side of face was swollen.  + sore throat.  L ear pain.  Went to [employer] race last weekend.  Husband w/ similar sxs.   Review of Systems For ROS see HPI     Objective:   Physical Exam  Vitals reviewed. Constitutional: She appears well-developed and well-nourished. No distress.  HENT:  Head: Normocephalic and atraumatic.       TMs normal bilaterally Mild nasal congestion Throat w/out erythema, edema, or exudate  Eyes: Conjunctivae and EOM are normal. Pupils are equal, round, and reactive to light.  Neck: Normal range of motion. Neck supple.  Cardiovascular: Normal rate, regular rhythm, normal heart sounds and intact distal pulses.   No murmur heard. Pulmonary/Chest: Effort normal. No respiratory distress. She has wheezes (diffuse expiratory wheezes).       + hacking cough  Lymphadenopathy:    She has no cervical adenopathy.  Skin: Skin is warm and dry.          Assessment & Plan:

## 2012-05-04 ENCOUNTER — Encounter: Payer: Self-pay | Admitting: Family Medicine

## 2012-05-04 ENCOUNTER — Ambulatory Visit (INDEPENDENT_AMBULATORY_CARE_PROVIDER_SITE_OTHER): Payer: 59 | Admitting: Family Medicine

## 2012-05-04 ENCOUNTER — Other Ambulatory Visit (HOSPITAL_COMMUNITY)
Admission: RE | Admit: 2012-05-04 | Discharge: 2012-05-04 | Disposition: A | Discharge status: Home / Self Care | Payer: 59 | Source: Ambulatory Visit | Attending: Family Medicine | Admitting: Family Medicine

## 2012-05-04 VITALS — BP 118/75 | HR 60 | Temp 98.3°F | Ht 66.75 in | Wt 166.2 lb

## 2012-05-04 DIAGNOSIS — Z01419 Encounter for gynecological examination (general) (routine) without abnormal findings: Secondary | ICD-10-CM | POA: Insufficient documentation

## 2012-05-04 DIAGNOSIS — I10 Essential (primary) hypertension: Secondary | ICD-10-CM

## 2012-05-04 DIAGNOSIS — E785 Hyperlipidemia, unspecified: Secondary | ICD-10-CM

## 2012-05-04 DIAGNOSIS — Z0000 Encounter for general adult medical examination without abnormal findings: Secondary | ICD-10-CM | POA: Insufficient documentation

## 2012-05-04 DIAGNOSIS — L723 Sebaceous cyst: Secondary | ICD-10-CM

## 2012-05-04 DIAGNOSIS — R252 Cramp and spasm: Secondary | ICD-10-CM

## 2012-05-04 DIAGNOSIS — Z124 Encounter for screening for malignant neoplasm of cervix: Secondary | ICD-10-CM

## 2012-05-04 LAB — LIPID PANEL
Cholesterol: 121 mg/dL (ref 0–200)
HDL: 47.6 mg/dL (ref >39.00)
LDL Cholesterol: 50 mg/dL (ref 0–99)
Total CHOL/HDL Ratio: 3
Triglycerides: 118 mg/dL (ref 0.0–149.0)
VLDL: 23.6 mg/dL (ref 0.0–40.0)

## 2012-05-04 LAB — CBC WITH DIFFERENTIAL/PLATELET
Basophils Absolute: 0 10*3/uL (ref 0.0–0.1)
Basophils Relative: 0.4 % (ref 0.0–3.0)
Eosinophils Absolute: 0.2 10*3/uL (ref 0.0–0.7)
Eosinophils Relative: 3 % (ref 0.0–5.0)
HCT: 36.8 % (ref 36.0–46.0)
Hemoglobin: 12.1 g/dL (ref 12.0–15.0)
Lymphocytes Relative: 27.8 % (ref 12.0–46.0)
Lymphs Abs: 2 10*3/uL (ref 0.7–4.0)
MCHC: 33 g/dL (ref 30.0–36.0)
MCV: 91.1 fl (ref 78.0–100.0)
Monocytes Absolute: 0.6 10*3/uL (ref 0.1–1.0)
Monocytes Relative: 8.2 % (ref 3.0–12.0)
Neutro Abs: 4.3 10*3/uL (ref 1.4–7.7)
Neutrophils Relative %: 60.6 % (ref 43.0–77.0)
Platelets: 260 10*3/uL (ref 150.0–400.0)
RBC: 4.04 Mil/uL (ref 3.87–5.11)
RDW: 13.7 % (ref 11.5–14.6)
WBC: 7.1 10*3/uL (ref 4.5–10.5)

## 2012-05-04 LAB — HEPATIC FUNCTION PANEL
ALT: 23 U/L (ref 0–35)
AST: 27 U/L (ref 0–37)
Albumin: 3.9 g/dL (ref 3.5–5.2)
Alkaline Phosphatase: 80 U/L (ref 39–117)
Bilirubin, Direct: 0 mg/dL (ref 0.0–0.3)
Total Bilirubin: 0.5 mg/dL (ref 0.3–1.2)
Total Protein: 7.2 g/dL (ref 6.0–8.3)

## 2012-05-04 LAB — BASIC METABOLIC PANEL
BUN: 11 mg/dL (ref 6–23)
CO2: 28 mEq/L (ref 19–32)
Calcium: 9.1 mg/dL (ref 8.4–10.5)
Chloride: 106 mEq/L (ref 96–112)
Creatinine, Ser: 0.7 mg/dL (ref 0.4–1.2)
GFR: 96.18 mL/min (ref >60.00)
Glucose, Bld: 75 mg/dL (ref 70–99)
Potassium: 3.9 mEq/L (ref 3.5–5.1)
Sodium: 142 mEq/L (ref 135–145)

## 2012-05-04 LAB — VITAMIN B12: Vitamin B-12: 544 pg/mL (ref 211–911)

## 2012-05-04 LAB — MAGNESIUM: Magnesium: 2 mg/dL (ref 1.5–2.5)

## 2012-05-04 LAB — FOLATE: Folate: 13.9 ng/mL (ref >5.9)

## 2012-05-04 LAB — TSH: TSH: 1.49 u[IU]/mL (ref 0.35–5.50)

## 2012-05-04 NOTE — Assessment & Plan Note (Signed)
Clinically unchanged but pt feels this is enlarging.  Refer to surgery for complete evaluation and tx.  Pt expressed understanding and is in agreement w/ plan.

## 2012-05-04 NOTE — Progress Notes (Signed)
  Subjective:    Patient ID: Vicki [name], female    DOB: 1956-04-25, 56 y.o.   MRN: [id number]  HPI CPE- UTD on mammo, due for pap.  Unable to have colonoscopy due to plavix use.  Sebaceous cyst- L side of neck, feel that this is enlarging.  Not painful.  Has not drained 'in 20 yrs'.  Leg cramps- occuring bilaterally, both night and day.  Pt reports adequate water intake, eating bananas and taking K+ supplement.  Mom has severe cramps.   Review of Systems Patient reports no vision/ hearing changes, adenopathy,fever, weight change,  persistant/recurrent hoarseness , swallowing issues, chest pain, palpitations, edema, persistant/recurrent cough, hemoptysis, dyspnea (rest/exertional/paroxysmal nocturnal), gastrointestinal bleeding (melena, rectal bleeding), abdominal pain, significant heartburn, bowel changes, GU symptoms (dysuria, hematuria, incontinence), Gyn symptoms (abnormal  bleeding, pain),  syncope, focal weakness, memory loss, numbness & tingling, skin/hair/nail changes, abnormal bruising or bleeding, anxiety, or depression.     Objective:   Physical Exam  General Appearance:    Alert, cooperative, no distress, appears stated age  Head:    Normocephalic, without obvious abnormality, atraumatic  Eyes:    PERRL, conjunctiva/corneas clear, EOM's intact, fundi    benign, both eyes  Ears:    Normal TM's and external ear canals, both ears  Nose:   Nares normal, septum midline, mucosa normal, no drainage    or sinus tenderness  Throat:   Lips, mucosa, and tongue normal; teeth and gums normal  Neck:   Supple, symmetrical, trachea midline, no adenopathy;    Thyroid: no enlargement/tenderness/nodules  Back:     Symmetric, no curvature, ROM normal, no CVA tenderness  Lungs:     Clear to auscultation bilaterally, respirations unlabored  Chest Wall:    No tenderness or deformity   Heart:    Regular rate and rhythm, S1 and S2 normal, no murmur, rub   or gallop  Breast Exam:    No tenderness,  masses, or nipple abnormality  Abdomen:     Soft, non-tender, bowel sounds active all four quadrants,    no masses, no organomegaly  Genitalia:    External genitalia normal, s/p TAH-BSO, mucosa pink and moist, no lesions or discharge present  Rectal:    Normal external appearance  Extremities:   Extremities normal, atraumatic, no cyanosis or edema  Pulses:   2+ and symmetric all extremities  Skin:   Skin color, texture, turgor normal, no rashes or lesions.  2 cm non-tender sebaceous cyst on L lateral neck  Lymph nodes:   Cervical, supraclavicular, and axillary nodes normal  Neurologic:   CNII-XII intact, normal strength, sensation and reflexes    throughout          Assessment & Plan:

## 2012-05-04 NOTE — Assessment & Plan Note (Signed)
Chronic problem.  Excellent control.  Asymptomatic.  No changes. 

## 2012-05-04 NOTE — Assessment & Plan Note (Signed)
Vaginal pap collected due to TAH.  After today will no longer need pap smears.

## 2012-05-04 NOTE — Patient Instructions (Signed)
Follow up in 6 months to recheck cholesterol We'll notify you of your lab results Someone will call you with your surgery appt about the neck cyst Try adding tonic water for the leg cramps Continue to drink plenty of fluids and take the potassium Call with any questions or concerns Have a great summer!

## 2012-05-04 NOTE — Assessment & Plan Note (Signed)
Pt's PE WNL.  UTD on mammo.  Unable to have colonoscopy due to plavix use.  Check labs.  Anticipatory guidance provided.

## 2012-05-04 NOTE — Assessment & Plan Note (Signed)
New.  Severe.  Pt w/ hx of hypokalemia.  Repeat labs.  Check Mag.  Increase fluid intake.  Recommended tonic water for quinine component.  Will follow.

## 2012-05-04 NOTE — Assessment & Plan Note (Signed)
Chronic problem.  Check labs.  Adjust meds prn  

## 2012-05-06 ENCOUNTER — Encounter: Payer: Self-pay | Admitting: *Deleted

## 2012-05-06 ENCOUNTER — Telehealth: Payer: Self-pay | Admitting: Family Medicine

## 2012-05-06 MED ORDER — AZITHROMYCIN 250 MG PO TABS: ORAL_TABLET | ORAL | Status: AC

## 2012-05-06 NOTE — Telephone Encounter (Signed)
Patient stated she has been seen twice this week and 1-time at the Saturday clinic 6.1.13. She is feeling somewhat better but still has some symptoms and wants to know if we can extend/refill the [facility] Please call on cell # [phone]

## 2012-05-06 NOTE — Progress Notes (Signed)
Addended by: Derry Lory A on: 05/06/2012 10:03 AM   Modules accepted: Orders

## 2012-05-06 NOTE — Telephone Encounter (Signed)
rx sent to pharmacy by e-script Pt aware 

## 2012-05-06 NOTE — Telephone Encounter (Signed)
Ok to refill Zpack 

## 2012-05-06 NOTE — Telephone Encounter (Signed)
Please advise 

## 2012-05-08 LAB — VITAMIN D 1,25 DIHYDROXY
Vitamin D 1, 25 (OH)2 Total: 33 pg/mL (ref 18–72)
Vitamin D2 1, 25 (OH)2: <8 pg/mL
Vitamin D3 1, 25 (OH)2: 33 pg/mL

## 2012-05-10 ENCOUNTER — Encounter: Payer: Self-pay | Admitting: *Deleted

## 2012-05-10 NOTE — Assessment & Plan Note (Signed)
Pt's cough and air exchanged improved s/p neb tx.  Start Zpack.  Albuterol inhaler prn.  Cough meds prn.  Reviewed supportive care and red flags that should prompt return.  Pt expressed understanding and is in agreement w/ plan.

## 2012-05-30 ENCOUNTER — Ambulatory Visit (INDEPENDENT_AMBULATORY_CARE_PROVIDER_SITE_OTHER): Payer: 59 | Admitting: Surgery

## 2012-05-30 ENCOUNTER — Encounter (INDEPENDENT_AMBULATORY_CARE_PROVIDER_SITE_OTHER): Payer: Self-pay | Admitting: Surgery

## 2012-05-30 VITALS — BP 128/76 | HR 64 | Temp 97.4°F | Ht 66.5 in | Wt 167.2 lb

## 2012-05-30 DIAGNOSIS — I2510 Atherosclerotic heart disease of native coronary artery without angina pectoris: Secondary | ICD-10-CM

## 2012-05-30 DIAGNOSIS — L723 Sebaceous cyst: Secondary | ICD-10-CM

## 2012-05-30 NOTE — Progress Notes (Signed)
Subjective:     Patient ID: Vicki [name], female   DOB: 27-Feb-1956, 56 y.o.   MRN: [id number]  HPI  SAE HANDRICH  [birth year] [id number]  Patient Care Team: Sheliah Hatch, MD as PCP - General [name], MD as Consulting Physician (Cardiology)  This patient is a 56 y.o.female who presents today for surgical evaluation at the request of Dr. Beverely Low.    reason for evaluation: Enlarging neck mass.  Probable cyst.  Consideration of removal.  Patient is a pleasant female.  She's had a mass on her left neck for at least 15 years.  She had one episode where it swelled and drained.  Eventually closed up and improved with antibiotics.  Never had it excised.  More recently it is common larger than.  Some purplish discoloration as well.  She had a massive myocardial infarction.  This required Prolonged hospital stay and herstenting.  She has since stopped smoking.  She is very busy.  Because the mass is gotten larger again she wishes to consider removal.  She has a feeling her cardiologist will not allow her to have this be done while off Plavix.  Patient Active Problem List  Diagnosis  . HYPERLIPIDEMIA  . EUSTACHIAN TUBE DYSFUNCTION  . HYPERTENSION  . MYOCARDIAL INFARCTION, HX OF  . SINUSITIS - ACUTE-NOS  . SEBACEOUS CYST, NECK  . PALPITATIONS  . Bursitis of ankle  . Bronchitis  . Bronchitis with bronchospasm  . Lip swelling  . Allergic rhinitis, seasonal  . Screening for malignant neoplasm of the cervix  . Routine gynecological examination  . Leg cramps  . CAD (coronary atherosclerotic disease)    Past Medical History  Diagnosis Date  . Myocardial infarct 02/15/10  . Hyperlipidemia   . Hypertension   . Clotting disorder   . Heart attack     Past Surgical History  Procedure Date  . Appendectomy 1987  . Hernia repair 1980    double bilateral  . Abdominal hysterectomy 1987    BSO  . Exploratory laparotomy   . Heart stents 01/2010    History   Social History    . Marital Status: Married    Spouse Name: N/A    Number of Children: 2  . Years of Education: N/A   Occupational History  . 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 Smoking status: Former Smoker    Quit date: 01/28/2010  . Smokeless tobacco: Not on file  . Alcohol Use: 0.0 oz/week    1-2 Glasses of wine per week  . Drug Use: No  . Sexually Active: Not on file   Other Topics Concern  . Not on file   Social History Narrative  . No narrative on file    Family History  Problem Relation Age of Onset  . Colon cancer Neg Hx   . Breast cancer Mother 57  . Cancer Mother     breast  . Cancer Father     colon    Current Outpatient Prescriptions  Medication Sig Dispense Refill  . ALPRAZolam (XANAX) 0.25 MG tablet Take 0.25 mg by mouth at bedtime as needed.      Marland Kitchen aspirin 81 MG tablet Take 81 mg by mouth 2 (two) times daily.       . cetirizine (ZYRTEC) 5 MG tablet Take 5 mg by mouth daily.        . clopidogrel (PLAVIX) 75 MG tablet Take 75 mg by  mouth daily.        Marland Kitchen lisinopril (PRINIVIL,ZESTRIL) 10 MG tablet Take 10 mg by mouth daily.        . metoprolol (LOPRESSOR) 50 MG tablet Take 1 tablet (50 mg total) by mouth 2 (two) times daily.  60 tablet  3  . mometasone (NASONEX) 50 MCG/ACT nasal spray Place 2 sprays into the nose daily.  17 g  3  . nitroGLYCERIN (NITROSTAT) 0.4 MG SL tablet Place 1 tablet (0.4 mg total) under the tongue every 5 (five) minutes as needed for chest pain.  100 tablet  3  . pantoprazole (PROTONIX) 40 MG tablet Take 40 mg by mouth 2 (two) times daily.        . potassium chloride SA (K-DUR,KLOR-CON) 20 MEQ tablet Take 20 mEq by mouth daily.        . rosuvastatin (CRESTOR) 5 MG tablet Take 5 mg by mouth daily.           No Known Allergies  BP 128/76  Pulse 64  Temp 97.4 F (36.3 C) (Temporal)  Ht 5' 6.5" (1.689 m)  Wt 167 lb 3.2 oz (75.841 kg)  BMI 26.58 kg/m2  SpO2 97%  No results found.   Review of Systems   Cardiovascular:       Patient walks 20-30 minutes without difficulty.  No exertional chest/neck/shoulder/arm pain.        Objective:   Physical Exam  Constitutional: She is oriented to person, place, and time. She appears well-developed and well-nourished. No distress.  HENT:  Head: Normocephalic.  Mouth/Throat: Oropharynx is clear and moist. No oropharyngeal exudate.  Eyes: Conjunctivae and EOM are normal. Pupils are equal, round, and reactive to light. No scleral icterus.  Neck: Normal range of motion. Neck supple. No tracheal deviation present.    Cardiovascular: Normal rate, regular rhythm and intact distal pulses.   Pulmonary/Chest: Effort normal and breath sounds normal. No respiratory distress. She exhibits no tenderness.  Abdominal: Soft. She exhibits no distension and no mass. There is no tenderness. Hernia confirmed negative in the right inguinal area and confirmed negative in the left inguinal area.  Genitourinary: No vaginal discharge found.  Musculoskeletal: Normal range of motion. She exhibits no tenderness.  Lymphadenopathy:    She has no cervical adenopathy.       Right: No inguinal adenopathy present.       Left: No inguinal adenopathy present.  Neurological: She is alert and oriented to person, place, and time. No cranial nerve deficit. She exhibits normal muscle tone. Coordination normal.  Skin: Skin is warm and dry. No rash noted. She is not diaphoretic. No erythema.  Psychiatric: She has a normal mood and affect. Her behavior is normal. Judgment and thought content normal.       Assessment:     SQ neck mass probable seb cyst    Plan:     I think this requires removal since it has come back it has been infected before.  I would prefer if she was off Plavix and to minimize risk of bleeding ecchymosis hematoma.  However, it is not a deal breaker.  If cardiology and says that she stay on this, I would plan to do it in the operating room where a half a greater  ability for hemostasis.  It seems rather superficial, so I am hoping that we'll not have major bleeding issues.  The pathophysiology of skin & subcutaneous masses was discussed.  Natural history risks without surgery were discussed.  I recommended surgery to remove the mass.  I explained the technique of removal with use of local anesthesia & possible need for more aggressive sedation/anesthesia for patient comfort.    Risks such as bleeding, infection, heart attack, death, and other risks were discussed.  I noted a good likelihood this will help address the problem.   Possibility that this will not correct all symptoms was explained. Possibility of regrowth/recurrence of the mass was discussed.  We will work to minimize complications. Questions were answered.  The patient expresses understanding & wishes to proceed with surgery.

## 2012-05-30 NOTE — Patient Instructions (Addendum)

## 2012-05-31 ENCOUNTER — Telehealth (INDEPENDENT_AMBULATORY_CARE_PROVIDER_SITE_OTHER): Payer: Self-pay

## 2012-05-31 ENCOUNTER — Encounter (INDEPENDENT_AMBULATORY_CARE_PROVIDER_SITE_OTHER): Payer: Self-pay

## 2012-05-31 NOTE — Telephone Encounter (Signed)
Faxed cardiac clearance request to Dr Hazle Coca office.

## 2012-06-10 ENCOUNTER — Telehealth (INDEPENDENT_AMBULATORY_CARE_PROVIDER_SITE_OTHER): Payer: Self-pay

## 2012-06-10 NOTE — Telephone Encounter (Signed)
Pt is not sure she wants to proceed with surgery now after speaking with Dr Allyson Sabal. The pt would have to come off the Plavix and she is just not sure. Dr Hazle Coca office will contact me if pt decides to go ahead with the surgery and then they would advise about the Coumadin.

## 2012-07-06 ENCOUNTER — Encounter (INDEPENDENT_AMBULATORY_CARE_PROVIDER_SITE_OTHER): Payer: Self-pay | Admitting: Surgery

## 2012-07-06 ENCOUNTER — Ambulatory Visit (INDEPENDENT_AMBULATORY_CARE_PROVIDER_SITE_OTHER): Payer: 59 | Admitting: Surgery

## 2012-07-06 VITALS — BP 126/84 | HR 60 | Temp 97.4°F | Ht 67.0 in | Wt 166.2 lb

## 2012-07-06 DIAGNOSIS — L723 Sebaceous cyst: Secondary | ICD-10-CM

## 2012-07-06 NOTE — Progress Notes (Signed)
General Surgery [facility]  Visit Diagnoses: 1. SEBACEOUS CYST, NECK     HISTORY: Patient is a 55 year old white female referred by her cardiologist for excision of an enlarging cystic mass from the left posterior neck. She was previously evaluated in our office by my partner. She desired second opinion and returns today for evaluation and recommendations.  Patient has an enlarging cystic mass in the left posterior neck which is been present for several years. She has had one episode of inflammation and drainage. She has never had surgical intervention.  Medical history is pertinent for a sudden death episode with coronary artery disease. This occurred 2 years ago. She has stents in place. She is on Plavix and aspirin as anti-coagulation in this can not be discontinued.  PERTINENT REVIEW OF SYSTEMS: Coronary artery disease with sudden death episode  EXAM: HEENT: normocephalic; pupils equal and reactive; sclerae clear; dentition good; mucous membranes moist NECK:  symmetric on extension; no palpable anterior or posterior cervical lymphadenopathy; no supraclavicular masses; no tenderness; 2 cm cystic mass at the hairline in the left posterior neck consistent with epidermal inclusion cyst, no sign of infection CHEST: clear to auscultation bilaterally without rales, rhonchi, or wheezes CARDIAC: regular rate and rhythm without significant murmur; peripheral pulses are full EXT:  non-tender without edema; no deformity NEURO: no gross focal deficits; no sign of tremor   IMPRESSION: 2 cm cystic mass left posterior neck, enlarging  PLAN: I discussed with the patient excision of this mass under local anesthesia and IV sedation as an outpatient procedure. Patient would like to proceed in early October. We will make arrangements for surgery.  The risks and benefits of the procedure have been discussed at length with the patient.  The patient understands the proposed  procedure, potential alternative treatments, and the course of recovery to be expected.  All of the patient's questions have been answered at this time.  The patient wishes to proceed with surgery.  Velora Heckler, MD, [facility] Office: [phone]

## 2012-07-06 NOTE — Patient Instructions (Signed)
Following surgery:  COCOA BUTTER & VITAMIN E CREAM  (Palmer's or other brand)  Apply cocoa butter/vitamin E cream to your incision 2 - 3 times daily.  Massage cream into incision for one minute with each application.  Use sunscreen (50 SPF or higher) for first 6 months after surgery if area is exposed to sun.  You may substitute Mederma or other scar reducing creams as desired.

## 2012-08-05 ENCOUNTER — Other Ambulatory Visit: Payer: Self-pay | Admitting: Family Medicine

## 2012-08-05 DIAGNOSIS — Z1231 Encounter for screening mammogram for malignant neoplasm of breast: Secondary | ICD-10-CM

## 2012-08-26 MED ORDER — HEPARIN SODIUM (PORCINE) 1000 UNIT/ML IJ SOLN
INTRAMUSCULAR | Status: AC
  Filled 2012-08-26: qty 1

## 2012-08-29 ENCOUNTER — Telehealth: Payer: Self-pay | Admitting: Family Medicine

## 2012-08-29 MED ORDER — ALPRAZOLAM 0.25 MG PO TABS: 0.2500 mg | ORAL_TABLET | Freq: Every evening | ORAL | Status: DC | PRN

## 2012-08-29 NOTE — Telephone Encounter (Signed)
Refill: Alprazolam 0.5mg  tablets. Take 1 tablet by mouth every night at bedtime as needed. Qty 30. Last fill 8.11.13

## 2012-08-29 NOTE — Telephone Encounter (Signed)
.  rx faxed to pharmacy, manually.  

## 2012-08-29 NOTE — Telephone Encounter (Signed)
Last OV 05-06-12 last refill 01-22-12 #30 with 3 refills

## 2012-08-29 NOTE — Telephone Encounter (Signed)
Ok for #30, 3 refills 

## 2012-08-30 NOTE — Progress Notes (Signed)
Pt seeing dr berry 09/01/12 am-to get clearance to have notes and ekg faxed Pt had MI 2011 with cardiac arrest at home-several minutes post arrest-was not supposed to live-spent several days in hospital-back working with no deficits  -no problems since. Will need istat

## 2012-09-01 NOTE — Progress Notes (Signed)
Chart reviewed with DR Ivin Booty, [place] for [facility]

## 2012-09-02 ENCOUNTER — Ambulatory Visit (HOSPITAL_BASED_OUTPATIENT_CLINIC_OR_DEPARTMENT_OTHER): Payer: 59 | Admitting: Anesthesiology

## 2012-09-02 ENCOUNTER — Encounter (HOSPITAL_BASED_OUTPATIENT_CLINIC_OR_DEPARTMENT_OTHER): Payer: Self-pay

## 2012-09-02 ENCOUNTER — Encounter (HOSPITAL_BASED_OUTPATIENT_CLINIC_OR_DEPARTMENT_OTHER): Payer: Self-pay | Admitting: Anesthesiology

## 2012-09-02 ENCOUNTER — Encounter (HOSPITAL_BASED_OUTPATIENT_CLINIC_OR_DEPARTMENT_OTHER)
Admission: RE | Disposition: A | Discharge status: Home / Self Care | Payer: Self-pay | Source: Ambulatory Visit | Attending: Surgery

## 2012-09-02 ENCOUNTER — Ambulatory Visit (HOSPITAL_BASED_OUTPATIENT_CLINIC_OR_DEPARTMENT_OTHER)
Admission: RE | Admit: 2012-09-02 | Discharge: 2012-09-02 | Disposition: A | Discharge status: Home / Self Care | Payer: 59 | Source: Ambulatory Visit | Attending: Surgery | Admitting: Surgery

## 2012-09-02 DIAGNOSIS — L723 Sebaceous cyst: Secondary | ICD-10-CM

## 2012-09-02 DIAGNOSIS — I10 Essential (primary) hypertension: Secondary | ICD-10-CM | POA: Insufficient documentation

## 2012-09-02 DIAGNOSIS — E785 Hyperlipidemia, unspecified: Secondary | ICD-10-CM | POA: Insufficient documentation

## 2012-09-02 HISTORY — PX: MASS EXCISION: SHX2000

## 2012-09-02 LAB — POCT I-STAT, CHEM 8
BUN: 12 mg/dL (ref 6–23)
Calcium, Ion: 1.25 mmol/L — ABNORMAL HIGH (ref 1.12–1.23)
Chloride: 107 mEq/L (ref 96–112)
Creatinine, Ser: 0.9 mg/dL (ref 0.50–1.10)
Glucose, Bld: 87 mg/dL (ref 70–99)
HCT: 38 % (ref 36.0–46.0)
Hemoglobin: 12.9 g/dL (ref 12.0–15.0)
Potassium: 4.3 mEq/L (ref 3.5–5.1)
Sodium: 144 mEq/L (ref 135–145)
TCO2: 24 mmol/L (ref 0–100)

## 2012-09-02 SURGERY — EXCISION MASS
Anesthesia: Monitor Anesthesia Care | Site: Neck | Laterality: Left | Wound class: Clean

## 2012-09-02 MED ORDER — OXYCODONE HCL 5 MG PO TABS: 5.0000 mg | ORAL_TABLET | Freq: Once | ORAL | Status: DC | PRN

## 2012-09-02 MED ORDER — FENTANYL CITRATE 0.05 MG/ML IJ SOLN
INTRAMUSCULAR | Status: DC | PRN
  Administered 2012-09-02: 100 ug via INTRAVENOUS

## 2012-09-02 MED ORDER — MIDAZOLAM HCL 5 MG/5ML IJ SOLN
INTRAMUSCULAR | Status: DC | PRN
  Administered 2012-09-02: 2 mg via INTRAVENOUS

## 2012-09-02 MED ORDER — HYDROCODONE-ACETAMINOPHEN 5-325 MG PO TABS: 1.0000 | ORAL_TABLET | ORAL | Status: DC | PRN

## 2012-09-02 MED ORDER — LACTATED RINGERS IV SOLN
INTRAVENOUS | Status: DC
  Administered 2012-09-02: 07:00:00 via INTRAVENOUS

## 2012-09-02 MED ORDER — EPHEDRINE SULFATE 50 MG/ML IJ SOLN
INTRAMUSCULAR | Status: DC | PRN
  Administered 2012-09-02: 10 mg via INTRAVENOUS
  Administered 2012-09-02: 5 mg via INTRAVENOUS

## 2012-09-02 MED ORDER — OXYCODONE HCL 5 MG/5ML PO SOLN: 5.0000 mg | Freq: Once | ORAL | Status: DC | PRN

## 2012-09-02 MED ORDER — PROPOFOL 10 MG/ML IV BOLUS
INTRAVENOUS | Status: DC | PRN
  Administered 2012-09-02 (×3): 20 mg via INTRAVENOUS

## 2012-09-02 MED ORDER — BUPIVACAINE-EPINEPHRINE 0.5% -1:200000 IJ SOLN
INTRAMUSCULAR | Status: DC | PRN
  Administered 2012-09-02: 18 mL

## 2012-09-02 MED ORDER — CEFAZOLIN SODIUM-DEXTROSE 2-3 GM-% IV SOLR
2.0000 g | INTRAVENOUS | Status: AC
Start: 2012-09-02 — End: 2012-09-02
  Administered 2012-09-02: 2 g via INTRAVENOUS

## 2012-09-02 MED ORDER — HYDROMORPHONE HCL PF 1 MG/ML IJ SOLN: 0.2500 mg | INTRAMUSCULAR | Status: DC | PRN

## 2012-09-02 MED ORDER — ONDANSETRON HCL 4 MG/2ML IJ SOLN: 4.0000 mg | Freq: Once | INTRAMUSCULAR | Status: DC | PRN

## 2012-09-02 SURGICAL SUPPLY — 45 items
ADH SKN CLS APL DERMABOND .7 (GAUZE/BANDAGES/DRESSINGS) ×1
APL SKNCLS STERI-STRIP NONHPOA (GAUZE/BANDAGES/DRESSINGS)
BANDAGE GAUZE ELAST BULKY 4 IN (GAUZE/BANDAGES/DRESSINGS) IMPLANT
BENZOIN TINCTURE PRP APPL 2/3 (GAUZE/BANDAGES/DRESSINGS) IMPLANT
BLADE SURG 15 STRL LF DISP TIS (BLADE) ×1 IMPLANT
BLADE SURG 15 STRL SS (BLADE) ×2
BLADE SURG ROTATE 9660 (MISCELLANEOUS) ×1 IMPLANT
BNDG COHESIVE 4X5 TAN STRL (GAUZE/BANDAGES/DRESSINGS) IMPLANT
CHLORAPREP W/TINT 26ML (MISCELLANEOUS) ×2 IMPLANT
CLEANER CAUTERY TIP 5X5 PAD (MISCELLANEOUS) IMPLANT
CLOTH BEACON ORANGE TIMEOUT ST (SAFETY) ×2 IMPLANT
COVER MAYO STAND STRL (DRAPES) ×2 IMPLANT
COVER TABLE BACK 60X90 (DRAPES) ×2 IMPLANT
DECANTER SPIKE VIAL GLASS SM (MISCELLANEOUS) IMPLANT
DERMABOND ADVANCED (GAUZE/BANDAGES/DRESSINGS) ×1
DERMABOND ADVANCED .7 DNX12 (GAUZE/BANDAGES/DRESSINGS) IMPLANT
DRAPE EXTREMITY T 121X128X90 (DRAPE) IMPLANT
DRAPE PED LAPAROTOMY (DRAPES) ×2 IMPLANT
DRAPE U-SHAPE 76X120 STRL (DRAPES) IMPLANT
DRAPE UTILITY XL STRL (DRAPES) ×2 IMPLANT
DRSG TEGADERM 4X4.75 (GAUZE/BANDAGES/DRESSINGS) IMPLANT
ELECT REM PT RETURN 9FT ADLT (ELECTROSURGICAL) ×2
ELECTRODE REM PT RTRN 9FT ADLT (ELECTROSURGICAL) ×1 IMPLANT
GAUZE SPONGE 4X4 12PLY STRL LF (GAUZE/BANDAGES/DRESSINGS) ×2 IMPLANT
GLOVE BIO SURGEON STRL SZ7.5 (GLOVE) ×1 IMPLANT
GLOVE BIOGEL PI IND STRL 8 (GLOVE) IMPLANT
GLOVE BIOGEL PI INDICATOR 8 (GLOVE) ×1
GLOVE SURG ORTHO 8.0 STRL STRW (GLOVE) ×2 IMPLANT
GOWN PREVENTION PLUS XLARGE (GOWN DISPOSABLE) ×1 IMPLANT
GOWN PREVENTION PLUS XXLARGE (GOWN DISPOSABLE) ×3 IMPLANT
NDL HYPO 25X1 1.5 SAFETY (NEEDLE) ×1 IMPLANT
NEEDLE HYPO 25X1 1.5 SAFETY (NEEDLE) ×2 IMPLANT
PACK BASIN DAY SURGERY FS (CUSTOM PROCEDURE TRAY) ×2 IMPLANT
PAD CLEANER CAUTERY TIP 5X5 (MISCELLANEOUS)
PENCIL BUTTON HOLSTER BLD 10FT (ELECTRODE) ×2 IMPLANT
SHEET MEDIUM DRAPE 40X70 STRL (DRAPES) IMPLANT
STOCKINETTE 4X48 STRL (DRAPES) IMPLANT
STRIP CLOSURE SKIN 1/2X4 (GAUZE/BANDAGES/DRESSINGS) IMPLANT
SUT ETHILON 3 0 PS 1 (SUTURE) IMPLANT
SUT VICRYL 3-0 CR8 SH (SUTURE) ×2 IMPLANT
SUT VICRYL 4-0 PS2 18IN ABS (SUTURE) IMPLANT
SYR CONTROL 10ML LL (SYRINGE) ×2 IMPLANT
TOWEL OR 17X24 6PK STRL BLUE (TOWEL DISPOSABLE) ×3 IMPLANT
TOWEL OR NON WOVEN STRL DISP B (DISPOSABLE) ×2 IMPLANT
WATER STERILE IRR 1000ML POUR (IV SOLUTION) ×1 IMPLANT

## 2012-09-02 NOTE — Brief Op Note (Signed)
09/02/2012  8:29 AM  PATIENT:  Vicki [name]  56 y.o. female  PRE-OPERATIVE DIAGNOSIS:  enlarging cystic mass Left neck, 2.0 cm  POST-OPERATIVE DIAGNOSIS:  same  PROCEDURE:  Procedure(s) (LRB) with comments: EXCISION MASS (Left) - Excise mass Left posterior neck  SURGEON:  Surgeon(s) and Role:    * Velora Heckler, MD - Primary  ANESTHESIA:   IV sedation  EBL:     BLOOD ADMINISTERED:none  DRAINS: none   LOCAL MEDICATIONS USED:  MARCAINE     SPECIMEN:  Excision  DISPOSITION OF SPECIMEN:  PATHOLOGY  COUNTS:  YES  TOURNIQUET:  * No tourniquets in log *  DICTATION: .Other Dictation: Dictation Number [id number]  PLAN OF CARE: Discharge to home after PACU  PATIENT DISPOSITION:  PACU - hemodynamically stable.   Delay start of Pharmacological VTE agent (>24hrs) due to surgical blood loss or risk of bleeding: yes  Velora Heckler, MD, [facility] Office: [phone]

## 2012-09-02 NOTE — H&P (Signed)
Vicki [name] is an 56 y.o. female.    Chief Complaint: cyst left posterior neck  HPI: Patient is a 56 year old white female referred by her cardiologist for excision of an enlarging cystic mass from the left posterior neck. She was previously evaluated in our office by my partner. She desired second opinion and returns today for evaluation and recommendations.  Patient has an enlarging cystic mass in the left posterior neck which is been present for several years. She has had one episode of inflammation and drainage. She has never had surgical intervention.  Medical history is pertinent for a sudden death episode with coronary artery disease. This occurred 2 years ago. She has stents in place. She is on Plavix and aspirin as anti-coagulation in this can not be discontinued.   Past Medical History  Diagnosis Date  . Myocardial infarct 02/15/10  . Hyperlipidemia   . Hypertension   . Clotting disorder   . Heart attack     Past Surgical History  Procedure Date  . Appendectomy 1987  . Hernia repair 1980    double bilateral  . Abdominal hysterectomy 1987    BSO  . Exploratory laparotomy   . Heart stents 01/2010  . Right chest wall exploration     Family History  Problem Relation Age of Onset  . Colon cancer Neg Hx   . Breast cancer Mother 73  . Cancer Mother     breast  . Cancer Father     colon   Social History:  reports that she quit smoking about 2 years ago. She does not have any smokeless tobacco history on file. She reports that she drinks alcohol. She reports that she does not use illicit drugs.  Allergies: No Known Allergies  Medications Prior to Admission  Medication Sig Dispense Refill  . ALPRAZolam (XANAX) 0.25 MG tablet Take 1 tablet (0.25 mg total) by mouth at bedtime as needed.  30 tablet  3  . aspirin 81 MG tablet Take 81 mg by mouth 2 (two) times daily.       . cetirizine (ZYRTEC) 5 MG tablet Take 5 mg by mouth daily.        . clopidogrel (PLAVIX) 75 MG tablet  Take 75 mg by mouth daily.        Marland Kitchen lisinopril (PRINIVIL,ZESTRIL) 10 MG tablet Take 10 mg by mouth daily.        . metoprolol (LOPRESSOR) 50 MG tablet Take 1 tablet (50 mg total) by mouth 2 (two) times daily.  60 tablet  3  . pantoprazole (PROTONIX) 40 MG tablet Take 40 mg by mouth 2 (two) times daily.        . potassium chloride SA (K-DUR,KLOR-CON) 20 MEQ tablet Take 20 mEq by mouth daily.        . rosuvastatin (CRESTOR) 5 MG tablet Take 5 mg by mouth daily.        . mometasone (NASONEX) 50 MCG/ACT nasal spray Place 2 sprays into the nose daily.  17 g  3  . nitroGLYCERIN (NITROSTAT) 0.4 MG SL tablet Place 1 tablet (0.4 mg total) under the tongue every 5 (five) minutes as needed for chest pain.  100 tablet  3    Results for orders placed during the hospital encounter of 09/02/12 (from the past 48 hour(s))  POCT I-STAT, CHEM 8     Status: Abnormal   Collection Time   09/02/12  7:16 AM      Component Value Range Comment   Sodium  144  135 - 145 mEq/L    Potassium 4.3  3.5 - 5.1 mEq/L    Chloride 107  96 - 112 mEq/L    BUN 12  6 - 23 mg/dL    Creatinine, Ser 1.61  0.50 - 1.10 mg/dL    Glucose, Bld 87  70 - 99 mg/dL    Calcium, Ion 0.96 (*) 1.12 - 1.23 mmol/L    TCO2 24  0 - 100 mmol/L    Hemoglobin 12.9  12.0 - 15.0 g/dL    HCT 04.5  40.9 - 81.1 %    No results found.  Review of Systems  Constitutional: Negative.   HENT: Negative.   Eyes: Negative.   Respiratory: Negative.   Cardiovascular: Negative.   Gastrointestinal: Negative.   Genitourinary: Negative.   Musculoskeletal: Negative.   Skin: Negative.   Neurological: Negative.   Endo/Heme/Allergies: Negative.   Psychiatric/Behavioral: Negative.     Blood pressure 113/75, pulse 48, temperature 98.1 F (36.7 C), temperature source Oral, resp. rate 18, height 5\' 7"  (1.702 m), weight 167 lb (75.751 kg), SpO2 96.00%. Physical Exam  Constitutional: She is oriented to person, place, and time. She appears well-developed and  well-nourished.  HENT:  Head: Normocephalic and atraumatic.  Right Ear: External ear normal.  Left Ear: External ear normal.  Mouth/Throat: Oropharynx is clear and moist.  Eyes: Conjunctivae normal and EOM are normal. Pupils are equal, round, and reactive to light. No scleral icterus.  Neck: Normal range of motion. Neck supple. No thyromegaly present.       approx 2 cm soft tissue mass left posterior neck  Cardiovascular: Normal rate, regular rhythm and normal heart sounds.   Respiratory: Effort normal and breath sounds normal. No respiratory distress.  Musculoskeletal: Normal range of motion.  Lymphadenopathy:    She has no cervical adenopathy.  Neurological: She is alert and oriented to person, place, and time.  Skin: Skin is warm and dry.  Psychiatric: She has a normal mood and affect. Her behavior is normal.     Assessment/Plan Soft tissue mass left posterior neck  - plan excision under local anesthesia and sedation  The risks and benefits of the procedure have been discussed at length with the patient.  The patient understands the proposed procedure, potential alternative treatments, and the course of recovery to be expected.  All of the patient's questions have been answered at this time.  The patient wishes to proceed with surgery.  Velora Heckler, MD, [facility] Office: [phone]    Tyrena Gohr [name] 09/02/2012, 7:25 AM

## 2012-09-02 NOTE — Anesthesia Postprocedure Evaluation (Signed)
  Anesthesia Post-op Note  Patient: Vicki [name]  Procedure(s) Performed: Procedure(s) (LRB) with comments: EXCISION MASS (Left) - Excise mass Left posterior neck  Patient Location: PACU  Anesthesia Type: MAC  Level of Consciousness: awake, alert  and oriented  Airway and Oxygen Therapy: Patient Spontanous Breathing  Post-op Pain: mild  Post-op Assessment: Post-op Vital signs reviewed and Patient's Cardiovascular Status Stable  Post-op Vital Signs: Reviewed and stable  Complications: No apparent anesthesia complications and pt continued to be in sinus rhythm in PACU at 50-60 HR. NO sx. noted

## 2012-09-02 NOTE — Op Note (Signed)
NAME:  Vicki [name], Vicki [name]                  ACCOUNT NO.:  [id number]  MEDICAL RECORD NO.:  [id number]  LOCATION:  CYT                          FACILITY:  [facility]  PHYSICIAN:  Velora Heckler, MD      DATE OF BIRTH:  [birth year]  DATE OF PROCEDURE:  09/02/2012                              OPERATIVE REPORT   PREOPERATIVE DIAGNOSIS:  Soft tissue mass, left posterior neck, 2.0 cm.  POSTOPERATIVE DIAGNOSIS:  Soft tissue mass, left posterior neck, 2.0 cm.  PROCEDURE:  Excision of soft tissue mass, left posterior neck, 2.0 cm.  SURGEON:  Velora Heckler, MD, FACS  ANESTHESIA:  Sedation per Sheldon Silvan, MD, local with marcaine  ESTIMATED BLOOD LOSS:  Minimal.  PREPARATION:  ChloraPrep.  COMPLICATIONS:  None.  INDICATIONS:  The patient is a 56 year old white female referred by her cardiologist for excision of an enlarging cystic mass on the left posterior neck.  This has been present for several years.  She has had 1 episode of inflammation and drainage.  She has never undergone surgical excision.  Significant past medical history for sudden death episode with coronary artery disease.  She has stents in place and is on anticoagulation which cannot be discontinued.  The patient now comes to surgery for excision.  BODY OF REPORT:  Procedure was done in OR #4 at the [facility].  The patient was brought to the operating room, placed in a right lateral decubitus position on the operating room table.  Following administration of intravenous sedation, the patient was prepped and draped in the usual strict aseptic fashion.  After ascertaining that an adequate level of sedation had been achieved, the skin was anesthetized with local anesthetic with epinephrine.  An elliptical incision was made with a #15 blade to encompass the entire mass.  Mass was dissected out from subcutaneous tissues sharply.  It was submitted to Pathology for review.  Hemostasis was obtained with electrocautery.   Skin edges were reapproximated with interrupted 3-0 Vicryl subcuticular sutures.  Wound was washed and dried.  Dermabond was applied as dressing.  The patient was brought to the recovery room for cardiac monitoring under the direction of anesthesia.  The patient tolerated the procedure well.  Velora Heckler, MD, [facility] Office: [phone]    TMG/MEDQ  D:  09/02/2012  T:  09/02/2012  Job:  [id number]  cc:   Nanetta Batty, M.D.

## 2012-09-02 NOTE — Transfer of Care (Signed)
Immediate Anesthesia Transfer of Care Note  Patient: Vicki [name]  Procedure(s) Performed: Procedure(s) (LRB) with comments: EXCISION MASS (Left) - Excise mass Left posterior neck  Patient Location: PACU  Anesthesia Type: MAC  Level of Consciousness: awake, alert  and oriented  Airway & Oxygen Therapy: Patient Spontanous Breathing  Post-op Assessment: Report given to PACU RN and Post -op Vital signs reviewed and stable  Post vital signs: Reviewed and stable  Complications: No apparent anesthesia complications

## 2012-09-02 NOTE — Anesthesia Preprocedure Evaluation (Signed)
Anesthesia Evaluation  Patient identified by MRN, date of birth, ID band Patient awake    Reviewed: Allergy & Precautions, H&P , NPO status , Patient's Chart, lab work & pertinent test results, reviewed documented beta blocker date and time   Airway Mallampati: I TM Distance: >3 FB Neck ROM: Full    Dental  (+) Teeth Intact and Dental Advisory Given   Pulmonary  breath sounds clear to auscultation        Cardiovascular Rhythm:Regular Rate:Normal     Neuro/Psych    GI/Hepatic   Endo/Other    Renal/GU      Musculoskeletal   Abdominal   Peds  Hematology   Anesthesia Other Findings   Reproductive/Obstetrics                           Anesthesia Physical Anesthesia Plan  ASA: III  Anesthesia Plan: MAC   Post-op Pain Management:    Induction: Intravenous  Airway Management Planned: Mask  Additional Equipment:   Intra-op Plan:   Post-operative Plan: Extubation in OR  Informed Consent: I have reviewed the patients History and Physical, chart, labs and discussed the procedure including the risks, benefits and alternatives for the proposed anesthesia with the patient or authorized representative who has indicated his/her understanding and acceptance.   Dental advisory given  Plan Discussed with: CRNA, Anesthesiologist and Surgeon  Anesthesia Plan Comments:         Anesthesia Quick Evaluation

## 2012-09-05 ENCOUNTER — Encounter (HOSPITAL_BASED_OUTPATIENT_CLINIC_OR_DEPARTMENT_OTHER): Payer: Self-pay | Admitting: Surgery

## 2012-09-05 ENCOUNTER — Telehealth (INDEPENDENT_AMBULATORY_CARE_PROVIDER_SITE_OTHER): Payer: Self-pay

## 2012-09-05 NOTE — Telephone Encounter (Signed)
LMOM Call to confirm po appt.

## 2012-09-05 NOTE — Progress Notes (Signed)
Quick Note:  Please contact patient and notify of benign pathology results.  Irlene Crudup M. Lillianne Eick, MD, FACS [facility] Office: [phone]   ______ 

## 2012-09-07 ENCOUNTER — Telehealth (INDEPENDENT_AMBULATORY_CARE_PROVIDER_SITE_OTHER): Payer: Self-pay

## 2012-09-07 NOTE — Telephone Encounter (Signed)
LMOM for pt to call. Per Dr Gerrit Friends path is benign cyst. Will advise pt of this once pt returns call.

## 2012-09-09 ENCOUNTER — Telehealth (INDEPENDENT_AMBULATORY_CARE_PROVIDER_SITE_OTHER): Payer: Self-pay | Admitting: General Surgery

## 2012-09-09 NOTE — Telephone Encounter (Signed)
Patient returned call from message left. Patient advised of path result and appointment on 09/14/12.

## 2012-09-14 ENCOUNTER — Ambulatory Visit (INDEPENDENT_AMBULATORY_CARE_PROVIDER_SITE_OTHER): Payer: 59 | Admitting: Surgery

## 2012-09-14 ENCOUNTER — Encounter (INDEPENDENT_AMBULATORY_CARE_PROVIDER_SITE_OTHER): Payer: Self-pay | Admitting: Surgery

## 2012-09-14 VITALS — BP 142/64 | HR 64 | Temp 97.1°F | Resp 16 | Ht 67.5 in | Wt 167.6 lb

## 2012-09-14 DIAGNOSIS — L723 Sebaceous cyst: Secondary | ICD-10-CM

## 2012-09-14 NOTE — Progress Notes (Signed)
General Surgery [facility]  Visit Diagnoses: 1. SEBACEOUS CYST, NECK     HISTORY: Patient returns for postoperative visit having undergone excision of an epidermal inclusion cyst from the left posterior neck. This was an outpatient procedure. She did well without any significant cardiac complications.  EXAM: Incision is healed nicely. Remaining Dermabond is removed in the office. No sign of seroma. No sign of infection.  IMPRESSION: Status post excision epidermal inclusion cyst left posterior neck  PLAN: Patient will begin applying topical creams to her incision. She will return for followup as needed.  Velora Heckler, MD, FACS General & Endocrine Surgery [facility]

## 2012-09-14 NOTE — Patient Instructions (Signed)
  COCOA BUTTER & VITAMIN E CREAM  ([employer] or other brand)  Apply cocoa butter/vitamin E cream to your incision 2 - 3 times daily.  Massage cream into incision for one minute with each application.  Use sunscreen (50 SPF or higher) for first 6 months after surgery if area is exposed to sun.  You may substitute Mederma or other scar reducing creams as desired.   

## 2012-09-19 ENCOUNTER — Ambulatory Visit
Admission: RE | Admit: 2012-09-19 | Discharge: 2012-09-19 | Disposition: A | Discharge status: Home / Self Care | Payer: 59 | Source: Ambulatory Visit | Attending: Family Medicine | Admitting: Family Medicine

## 2012-09-19 DIAGNOSIS — Z1231 Encounter for screening mammogram for malignant neoplasm of breast: Secondary | ICD-10-CM

## 2012-11-02 ENCOUNTER — Ambulatory Visit (INDEPENDENT_AMBULATORY_CARE_PROVIDER_SITE_OTHER): Payer: 59 | Admitting: Family Medicine

## 2012-11-02 ENCOUNTER — Encounter: Payer: Self-pay | Admitting: Family Medicine

## 2012-11-02 VITALS — BP 104/70 | HR 69 | Temp 98.3°F | Ht 66.75 in | Wt 165.4 lb

## 2012-11-02 DIAGNOSIS — J019 Acute sinusitis, unspecified: Secondary | ICD-10-CM

## 2012-11-02 MED ORDER — AMOXICILLIN 875 MG PO TABS: 875.0000 mg | ORAL_TABLET | Freq: Two times a day (BID) | ORAL | Status: DC

## 2012-11-02 NOTE — Progress Notes (Signed)
  Subjective:    Patient ID: Vicki [name], female    DOB: Apr 18, 1956, 56 y.o.   MRN: [id number]  HPI URI- sxs started on Sunday.  Started Vit C and Mucinex w/out relief.  L sided facial pain.  R ear pain.  + tooth pain, painful to touch face.  No fever.  + cough- intermittently productive.  No known sick contacts.  + nasal congestion.  + HA, some dizziness.   Review of Systems For ROS see HPI     Objective:   Physical Exam  Constitutional: She appears well-developed and well-nourished. No distress.  HENT:  Head: Normocephalic and atraumatic.  Right Ear: Tympanic membrane normal.  Left Ear: Tympanic membrane normal.  Nose: Mucosal edema and rhinorrhea present. Right sinus exhibits no maxillary sinus tenderness and no frontal sinus tenderness. Left sinus exhibits maxillary sinus tenderness and frontal sinus tenderness.  Mouth/Throat: Uvula is midline and mucous membranes are normal. Posterior oropharyngeal erythema present. No oropharyngeal exudate.  Eyes: Conjunctivae normal and EOM are normal. Pupils are equal, round, and reactive to light.  Neck: Normal range of motion. Neck supple.  Cardiovascular: Normal rate, regular rhythm and normal heart sounds.   Pulmonary/Chest: Effort normal and breath sounds normal. No respiratory distress. She has no wheezes.  Lymphadenopathy:    She has no cervical adenopathy.          Assessment & Plan:

## 2012-11-02 NOTE — Patient Instructions (Addendum)
Start the Amoxicillin twice daily- take w/ food Drink plenty of fluids Continue the Mucinex to thin your congestion REST! Tylenol as needed for pain Call with any questions or concerns Hang in there!!! Happy Holidays!!

## 2012-11-06 NOTE — Assessment & Plan Note (Signed)
Pt's sxs and PE consistent w/ infxn.  Start abx.  Reviewed supportive care and red flags that should prompt return.  Pt expressed understanding and is in agreement w/ plan.  

## 2013-02-21 ENCOUNTER — Telehealth: Payer: Self-pay | Admitting: Family Medicine

## 2013-02-21 NOTE — Telephone Encounter (Signed)
REFILLS ON  ALPRAZOLAM 0.25 MG # 30  TAKE 1 TABLET BY MOUTH EVERY NIGHT AT BEDTIME AS NEEDED LAST FILLED 01.20.2014

## 2013-02-21 NOTE — Telephone Encounter (Signed)
Ok to refill? Last OV 12.4.13 Last filled 9.30.13

## 2013-02-21 NOTE — Telephone Encounter (Signed)
Ok for #30, 3 refills 

## 2013-02-22 MED ORDER — ALPRAZOLAM 0.25 MG PO TABS: 0.2500 mg | ORAL_TABLET | Freq: Every evening | ORAL | Status: DC | PRN

## 2013-02-22 NOTE — Telephone Encounter (Signed)
Refill done.  

## 2013-02-23 ENCOUNTER — Encounter: Payer: Self-pay | Admitting: Family Medicine

## 2013-02-23 ENCOUNTER — Ambulatory Visit (INDEPENDENT_AMBULATORY_CARE_PROVIDER_SITE_OTHER): Payer: 59 | Admitting: Family Medicine

## 2013-02-23 VITALS — BP 108/70 | HR 72 | Temp 98.1°F | Ht 66.5 in | Wt 167.4 lb

## 2013-02-23 DIAGNOSIS — M545 Low back pain, unspecified: Secondary | ICD-10-CM

## 2013-02-23 MED ORDER — PREDNISONE 10 MG PO TABS: ORAL_TABLET | ORAL | Status: DC

## 2013-02-23 MED ORDER — CYCLOBENZAPRINE HCL 10 MG PO TABS: 10.0000 mg | ORAL_TABLET | Freq: Three times a day (TID) | ORAL | Status: DC | PRN

## 2013-02-23 NOTE — Patient Instructions (Addendum)
Start the prednisone as directed- take w/ food Use the flexeril (muscle relaxer) at night and on weekends- will cause drowsiness Heating pad for pain and spasm relief Can continue tylenol as needed Do some gentle stretching to avoid stiffness If no improvement in the next week- call me and we'll refer to ortho Hang in there!!! Happy Spring!

## 2013-02-23 NOTE — Progress Notes (Signed)
  Subjective:    Patient ID: Vicki [name], female    DOB: [birth year], 57 y.o.   MRN: [id number]  HPI Hip pain- L sided hip/buttock pain.  Started Monday when rising from chair.  Pain has worsened since then.  No radiation into thigh.  No weakness or numbness.  No pain when sitting 'perfectly still'.  Worsens w/ movement.  Has tried tylenol and advil combined w/out relief.  Used icy hot patches w/out relief.  Used topical myoflex w/out relief.  Fell ~10 days ago, hitting L shoulder against door jamb but no apparent hip injury at that time.   Review of Systems For ROS see HPI     Objective:   Physical Exam  Vitals reviewed. Constitutional: She is oriented to person, place, and time. She appears well-developed and well-nourished. No distress.  Cardiovascular: Intact distal pulses.   Musculoskeletal: She exhibits no edema.  Pain w/ flexion, no pain w/ extension + TTP over L buttock and sciatic notch  Neurological: She is alert and oriented to person, place, and time. She has normal reflexes. Coordination normal.  (-) SLR bilaterally  Psychiatric: She has a normal mood and affect. Her behavior is normal.          Assessment & Plan:

## 2013-02-24 NOTE — Assessment & Plan Note (Signed)
New.  Pt w/out red flag on hx or PE.  Start pred taper, muscle relaxer prn.  Heat.  Reviewed supportive care and red flags that should prompt return.  Pt expressed understanding and is in agreement w/ plan.

## 2013-03-23 ENCOUNTER — Encounter: Payer: Self-pay | Admitting: Internal Medicine

## 2013-03-23 ENCOUNTER — Ambulatory Visit (INDEPENDENT_AMBULATORY_CARE_PROVIDER_SITE_OTHER): Payer: 59 | Admitting: Internal Medicine

## 2013-03-23 VITALS — BP 118/70 | HR 58 | Temp 97.4°F | Wt 168.0 lb

## 2013-03-23 DIAGNOSIS — J302 Other seasonal allergic rhinitis: Secondary | ICD-10-CM

## 2013-03-23 DIAGNOSIS — J309 Allergic rhinitis, unspecified: Secondary | ICD-10-CM

## 2013-03-23 MED ORDER — METHYLPREDNISOLONE ACETATE 80 MG/ML IJ SUSP
80.0000 mg | Freq: Once | INTRAMUSCULAR | Status: AC
  Administered 2013-03-23: 80 mg via INTRAMUSCULAR

## 2013-03-23 NOTE — Progress Notes (Signed)
  Subjective:    Patient ID: Vicki [name], female    DOB: 06-17-1956, 57 y.o.   MRN: [id number]  HPI She presents with her classic, chronic seasonal rhinoconjunctivitis with itchy, watery, burning eyes and rhinitis.  She has had this issue since she was 57 years old. Remotely she was skin tested and found to be allergic to grass.  The symptoms are rhinoconjunctivitis; pulmonary symptoms are late phenomena.    Review of Systems  She has had some pain in the right ear without discharge. She denies frontal headache, facial pain, nasal purulence, sore throat, shortness of breath, or wheezing. She also denies fever, chills, or sweats.     Objective:   Physical Exam General appearance:good health ;well nourished; no acute distress or increased work of breathing is present.  No  lymphadenopathy about the head, neck, or axilla noted.   Eyes: No conjunctival inflammation or lid edema is present.   Ears:  External ear exam shows no significant lesions or deformities.  Otoscopic examination reveals clear canals, tympanic membranes are intact bilaterally without bulging, retraction, inflammation or discharge.  Nose:  External nasal examination shows no deformity or inflammation. Nasal mucosa are pink and moist without lesions or exudates. No septal dislocation or deviation.No obstruction to airflow.   Oral exam: Dental hygiene is good; lips and gums are healthy appearing.There is no oropharyngeal erythema or exudate noted.   Neck:  No deformities,  masses, or tenderness noted.      Heart:  Normal rate and regular rhythm. S1 and S2 normal without gallop, murmur, click, rub or other extra sounds.   Lungs:.With deep inspiration she has isolated, scattered inspiratory pops/wheezes. No increased work of breathing.    Extremities:  No cyanosis, edema, or clubbing  noted    Skin: Warm & dry .         Assessment & Plan:

## 2013-03-23 NOTE — Patient Instructions (Addendum)

## 2013-03-23 NOTE — Assessment & Plan Note (Signed)
Depo-Medrol will be administered as per protocol established by Dr. Beverely Low. Nasal hygiene discussed

## 2013-04-27 ENCOUNTER — Other Ambulatory Visit: Payer: Self-pay

## 2013-04-27 MED ORDER — METOPROLOL TARTRATE 50 MG PO TABS: 50.0000 mg | ORAL_TABLET | Freq: Two times a day (BID) | ORAL | Status: DC

## 2013-04-27 NOTE — Telephone Encounter (Signed)
Rx was called in to pharmacy. 

## 2013-05-01 ENCOUNTER — Encounter: Payer: Self-pay | Admitting: Family Medicine

## 2013-05-01 ENCOUNTER — Ambulatory Visit (INDEPENDENT_AMBULATORY_CARE_PROVIDER_SITE_OTHER): Payer: 59 | Admitting: Family Medicine

## 2013-05-01 VITALS — BP 120/80 | HR 62 | Temp 98.4°F | Ht 66.5 in | Wt 163.8 lb

## 2013-05-01 DIAGNOSIS — J40 Bronchitis, not specified as acute or chronic: Secondary | ICD-10-CM

## 2013-05-01 DIAGNOSIS — J209 Acute bronchitis, unspecified: Secondary | ICD-10-CM

## 2013-05-01 DIAGNOSIS — R252 Cramp and spasm: Secondary | ICD-10-CM

## 2013-05-01 LAB — BASIC METABOLIC PANEL
BUN: 16 mg/dL (ref 6–23)
CO2: 25 mEq/L (ref 19–32)
Calcium: 9.4 mg/dL (ref 8.4–10.5)
Chloride: 106 mEq/L (ref 96–112)
Creatinine, Ser: 0.8 mg/dL (ref 0.4–1.2)
GFR: 81.64 mL/min (ref >60.00)
Glucose, Bld: 75 mg/dL (ref 70–99)
Potassium: 4 mEq/L (ref 3.5–5.1)
Sodium: 138 mEq/L (ref 135–145)

## 2013-05-01 LAB — MAGNESIUM: Magnesium: 1.9 mg/dL (ref 1.5–2.5)

## 2013-05-01 MED ORDER — ALBUTEROL SULFATE HFA 108 (90 BASE) MCG/ACT IN AERS: 2.0000 | INHALATION_SPRAY | RESPIRATORY_TRACT | Status: DC | PRN

## 2013-05-01 MED ORDER — ALBUTEROL SULFATE (5 MG/ML) 0.5% IN NEBU
2.5000 mg | INHALATION_SOLUTION | Freq: Once | RESPIRATORY_TRACT | Status: AC
  Administered 2013-05-01: 2.5 mg via RESPIRATORY_TRACT

## 2013-05-01 MED ORDER — PROMETHAZINE-DM 6.25-15 MG/5ML PO SYRP: 5.0000 mL | ORAL_SOLUTION | Freq: Four times a day (QID) | ORAL | Status: DC | PRN

## 2013-05-01 MED ORDER — IPRATROPIUM BROMIDE 0.02 % IN SOLN
0.5000 mg | Freq: Once | RESPIRATORY_TRACT | Status: AC
  Administered 2013-05-01: 0.5 mg via RESPIRATORY_TRACT

## 2013-05-01 NOTE — Patient Instructions (Addendum)
We'll notify you of your lab results and make any changes If no better by Thursday or Friday- call me! Use the inhaler every 4 hrs as needed for chest tightness/wheezing Drink plenty of fluids Use the cough syrup as needed- will cause drowsiness Mucinex DM for daytime cough REST! Hang in there!

## 2013-05-01 NOTE — Assessment & Plan Note (Signed)
Ongoing problem.  Pt taking K+ supplement and drinking plenty of fluids.  Will check Mg and replete prn.  Pt expressed understanding and is in agreement w/ plan.

## 2013-05-01 NOTE — Assessment & Plan Note (Signed)
Recurrent issue.  Pt's wheezing and barking cough cleared w/ neb tx.  Start albuterol HFA prn.  No need for steroids at this time.  Cough syrup prn.  Reviewed supportive care and red flags that should prompt return.  Pt expressed understanding and is in agreement w/ plan.

## 2013-05-01 NOTE — Progress Notes (Signed)
  Subjective:    Patient ID: Vicki [name], female    DOB: 1956/09/06, 57 y.o.   MRN: [id number]  HPI Cough- pt has hx of allergies turing into bronchitis/infxn.  sxs started last week w/ sore throat.  Now w/ severe, barking cough.  Cough is productive.  No fevers.  Mild sinus pain/pressure.  + HA.  No N/V/D. + body aches. No known sick contacts.  Still taking daily allergy medications.  Pt took Zpack last week w/out relief- sxs continue to worsen.  Leg cramps- nocturnal.  Severe.  Occuring in feet, legs, arms.  Taking K+ daily and eating bananas.  Drinking plenty of fluids.  Has not had magnesium checked.   Review of Systems For ROS see HPI     Objective:   Physical Exam  Vitals reviewed. Constitutional: She appears well-developed and well-nourished. No distress.  HENT:  Head: Normocephalic and atraumatic.  TMs normal bilaterally Mild nasal congestion Throat w/out erythema, edema, or exudate  Eyes: Conjunctivae and EOM are normal. Pupils are equal, round, and reactive to light.  Neck: Normal range of motion. Neck supple.  Cardiovascular: Normal rate, regular rhythm, normal heart sounds and intact distal pulses.   No murmur heard. Pulmonary/Chest: Effort normal. No respiratory distress. She has wheezes (diffuse expiratory wheezes, cleared s/p neb tx).  + hacking cough  Musculoskeletal: She exhibits no edema (of LEs bilaterally) and no tenderness.  Lymphadenopathy:    She has no cervical adenopathy.          Assessment & Plan:

## 2013-05-05 ENCOUNTER — Telehealth: Payer: Self-pay | Admitting: Family Medicine

## 2013-05-05 MED ORDER — AMOXICILLIN 500 MG PO CAPS: 500.0000 mg | ORAL_CAPSULE | Freq: Two times a day (BID) | ORAL | Status: DC

## 2013-05-05 NOTE — Telephone Encounter (Signed)
Pt was called to notify the abx had been sent in. Pt stated that she could not get rid of her cough. Was very upset about [place] sending in an antibiotic for her instead of a steroid. Stated that she will see [place] next week.

## 2013-05-05 NOTE — Telephone Encounter (Signed)
Med filled.  

## 2013-05-05 NOTE — Telephone Encounter (Signed)
Pt seen on 05-01-13. Please advise.

## 2013-05-05 NOTE — Telephone Encounter (Signed)
Ok for Amox 500mg  BID x10 days.  Continue cough syrup given at OV

## 2013-05-05 NOTE — Telephone Encounter (Signed)
Pt called and stated cough not better would like med called in to [facility]

## 2013-05-05 NOTE — Telephone Encounter (Signed)
Pt did not mention wheezing in her call.  Prednisone is not used to treat cough- it's used to tx wheezing.  Would need OV prior to prednisone.  Will see her next week if needed.

## 2013-05-09 ENCOUNTER — Ambulatory Visit (INDEPENDENT_AMBULATORY_CARE_PROVIDER_SITE_OTHER): Payer: 59 | Admitting: Family Medicine

## 2013-05-09 ENCOUNTER — Encounter: Payer: Self-pay | Admitting: Family Medicine

## 2013-05-09 VITALS — BP 118/78 | HR 65 | Temp 98.2°F | Ht 66.5 in | Wt 165.6 lb

## 2013-05-09 DIAGNOSIS — M7710 Lateral epicondylitis, unspecified elbow: Secondary | ICD-10-CM

## 2013-05-09 DIAGNOSIS — M7711 Lateral epicondylitis, right elbow: Secondary | ICD-10-CM

## 2013-05-09 MED ORDER — PREDNISONE 20 MG PO TABS: ORAL_TABLET | ORAL | Status: DC

## 2013-05-09 NOTE — Patient Instructions (Addendum)
This is tennis elbow (lateral epicondylitis) ICE at least twice daily Get one of those counter-force forearm straps to offload the pressure Start the Prednisone- take w/ food Call w/ any questions or concerns Hang in there!

## 2013-05-09 NOTE — Assessment & Plan Note (Signed)
New.  Pt's sxs and PE consistent.  Pt unable to take NSAIDs due to cardiac hx.  Will tx w/ prednisone, counter-force strap, and ice.  Reviewed supportive care and red flags that should prompt return.  Pt expressed understanding and is in agreement w/ plan.

## 2013-05-09 NOTE — Progress Notes (Signed)
  Subjective:    Patient ID: Tretha Sciara, female    DOB: [birth year], 57 y.o.   MRN: [id number]  HPI R arm pain- localized to lateral epicondyle.  sxs started ~3 weeks ago.  Pain is now radiating down arm through large forearm muscle.  Pain to click keys.  Pain to twist or grip, use computer.  R hand dominant.  No known injury or recent change in activity.   Review of Systems For ROS see HPI     Objective:   Physical Exam  Vitals reviewed. Constitutional: She appears well-developed and well-nourished. No distress.  Cardiovascular: Intact distal pulses.   Musculoskeletal: She exhibits tenderness (TTP over R lateral epicondyle). She exhibits no edema.  Pain w/ resisted suppination/pronation Normal grip strength  Neurological: She has normal reflexes.  Skin: Skin is warm and dry.          Assessment & Plan:

## 2013-06-30 ENCOUNTER — Ambulatory Visit (INDEPENDENT_AMBULATORY_CARE_PROVIDER_SITE_OTHER): Payer: 59 | Admitting: Family Medicine

## 2013-06-30 ENCOUNTER — Encounter: Payer: Self-pay | Admitting: Family Medicine

## 2013-06-30 VITALS — BP 120/80 | HR 66 | Temp 98.2°F | Ht 66.5 in | Wt 162.2 lb

## 2013-06-30 DIAGNOSIS — R195 Other fecal abnormalities: Secondary | ICD-10-CM

## 2013-06-30 DIAGNOSIS — Z01419 Encounter for gynecological examination (general) (routine) without abnormal findings: Secondary | ICD-10-CM

## 2013-06-30 DIAGNOSIS — M7711 Lateral epicondylitis, right elbow: Secondary | ICD-10-CM

## 2013-06-30 DIAGNOSIS — Z1331 Encounter for screening for depression: Secondary | ICD-10-CM

## 2013-06-30 DIAGNOSIS — M7710 Lateral epicondylitis, unspecified elbow: Secondary | ICD-10-CM

## 2013-06-30 LAB — HEPATIC FUNCTION PANEL
ALT: 21 U/L (ref 0–35)
AST: 25 U/L (ref 0–37)
Albumin: 4 g/dL (ref 3.5–5.2)
Alkaline Phosphatase: 63 U/L (ref 39–117)
Bilirubin, Direct: 0 mg/dL (ref 0.0–0.3)
Total Bilirubin: 0.4 mg/dL (ref 0.3–1.2)
Total Protein: 7 g/dL (ref 6.0–8.3)

## 2013-06-30 LAB — CBC WITH DIFFERENTIAL/PLATELET
Basophils Absolute: 0 10*3/uL (ref 0.0–0.1)
Basophils Relative: 0.3 % (ref 0.0–3.0)
Eosinophils Absolute: 0.2 10*3/uL (ref 0.0–0.7)
Eosinophils Relative: 2.7 % (ref 0.0–5.0)
HCT: 37.5 % (ref 36.0–46.0)
Hemoglobin: 12.5 g/dL (ref 12.0–15.0)
Lymphocytes Relative: 33.2 % (ref 12.0–46.0)
Lymphs Abs: 1.9 10*3/uL (ref 0.7–4.0)
MCHC: 33.4 g/dL (ref 30.0–36.0)
MCV: 92.1 fl (ref 78.0–100.0)
Monocytes Absolute: 0.4 10*3/uL (ref 0.1–1.0)
Monocytes Relative: 7.3 % (ref 3.0–12.0)
Neutro Abs: 3.3 10*3/uL (ref 1.4–7.7)
Neutrophils Relative %: 56.5 % (ref 43.0–77.0)
Platelets: 221 10*3/uL (ref 150.0–400.0)
RBC: 4.07 Mil/uL (ref 3.87–5.11)
RDW: 13.6 % (ref 11.5–14.6)
WBC: 5.8 10*3/uL (ref 4.5–10.5)

## 2013-06-30 LAB — BASIC METABOLIC PANEL
BUN: 13 mg/dL (ref 6–23)
CO2: 27 mEq/L (ref 19–32)
Calcium: 9.3 mg/dL (ref 8.4–10.5)
Chloride: 108 mEq/L (ref 96–112)
Creatinine, Ser: 0.8 mg/dL (ref 0.4–1.2)
GFR: 82.83 mL/min (ref >60.00)
Glucose, Bld: 89 mg/dL (ref 70–99)
Potassium: 3.9 mEq/L (ref 3.5–5.1)
Sodium: 139 mEq/L (ref 135–145)

## 2013-06-30 LAB — TSH: TSH: 1.78 u[IU]/mL (ref 0.35–5.50)

## 2013-06-30 LAB — LIPID PANEL
Cholesterol: 184 mg/dL (ref 0–200)
HDL: 49.4 mg/dL (ref >39.00)
LDL Cholesterol: 109 mg/dL — ABNORMAL HIGH (ref 0–99)
Total CHOL/HDL Ratio: 4
Triglycerides: 128 mg/dL (ref 0.0–149.0)
VLDL: 25.6 mg/dL (ref 0.0–40.0)

## 2013-06-30 MED ORDER — METOPROLOL TARTRATE 50 MG PO TABS: 50.0000 mg | ORAL_TABLET | Freq: Two times a day (BID) | ORAL | Status: DC

## 2013-06-30 MED ORDER — POTASSIUM CHLORIDE CRYS ER 20 MEQ PO TBCR: 20.0000 meq | EXTENDED_RELEASE_TABLET | Freq: Every day | ORAL | Status: DC

## 2013-06-30 MED ORDER — LISINOPRIL 10 MG PO TABS: 10.0000 mg | ORAL_TABLET | Freq: Every day | ORAL | Status: DC

## 2013-06-30 MED ORDER — ROSUVASTATIN CALCIUM 5 MG PO TABS: 5.0000 mg | ORAL_TABLET | Freq: Every day | ORAL | Status: DC

## 2013-06-30 NOTE — Progress Notes (Signed)
  Subjective:    Patient ID: Vicki [name], female    DOB: [birth year], 57 y.o.   MRN: [id number]  HPI CPE- UTD on mammo, unable to do colonoscopy b/c of plavix use.  No need for pap due to TAH-BSO.   Review of Systems Patient reports no vision/ hearing changes, adenopathy,fever, weight change,  persistant/recurrent hoarseness , swallowing issues, chest pain, palpitations, edema, persistant/recurrent cough, hemoptysis, dyspnea (rest/exertional/paroxysmal nocturnal), gastrointestinal bleeding (melena, rectal bleeding), abdominal pain, significant heartburn, GU symptoms (dysuria, hematuria, incontinence), Gyn symptoms (abnormal  bleeding, pain),  syncope, focal weakness, memory loss, numbness & tingling, skin/hair/nail changes, abnormal bruising or bleeding, anxiety, or depression.   Bowel changes- hx of watery stools since severe infxn and 4 rounds of abx.  Intense, constant.  Has seen Dr Evette Cristal.  R elbow pain- wearing counterforce strap but no improvement in pain.    Objective:   Physical Exam General Appearance:    Alert, cooperative, no distress, appears stated age  Head:    Normocephalic, without obvious abnormality, atraumatic  Eyes:    PERRL, conjunctiva/corneas clear, EOM's intact, fundi    benign, both eyes  Ears:    Normal TM's and external ear canals, both ears  Nose:   Nares normal, septum midline, mucosa normal, no drainage    or sinus tenderness  Throat:   Lips, mucosa, and tongue normal; teeth and gums normal  Neck:   Supple, symmetrical, trachea midline, no adenopathy;    Thyroid: no enlargement/tenderness/nodules  Back:     Symmetric, no curvature, ROM normal, no CVA tenderness  Lungs:     Clear to auscultation bilaterally, respirations unlabored  Chest Wall:    No tenderness or deformity   Heart:    Regular rate and rhythm, S1 and S2 normal, no murmur, rub   or gallop  Breast Exam:    Deferred to mammo  Abdomen:     Soft, non-tender, bowel sounds active all four quadrants,     no masses, no organomegaly  Genitalia:    Deferred  Rectal:    Extremities:   Extremities normal, atraumatic, no cyanosis or edema  Pulses:   2+ and symmetric all extremities  Skin:   Skin color, texture, turgor normal, no rashes or lesions  Lymph nodes:   Cervical, supraclavicular, and axillary nodes normal  Neurologic:   CNII-XII intact, normal strength, sensation and reflexes    throughout          Assessment & Plan:

## 2013-06-30 NOTE — Patient Instructions (Addendum)
Follow up in 6 months to recheck cholesterol We'll notify you of your lab results and make any changes if needed Keep up the good work!  You look great! We'll call you with your ortho and GI appts Call with any questions or concerns Have a great trip!!!

## 2013-07-01 NOTE — Assessment & Plan Note (Signed)
New to provider but ongoing for pt.  Has seen GI previously but they told her that wouldn't do anything w/out a colonoscopy and her cardiologist will not let her stop the plavix in order to do this.  Will refer to GI for 2nd opinion.  Pt expressed understanding and is in agreement w/ plan.

## 2013-07-01 NOTE — Assessment & Plan Note (Signed)
Pt's PE WNL.  UTD on mammo.  Unable to have colonoscopy due to ASA and plavix use.  Check labs.  Anticipatory guidance provided.

## 2013-07-01 NOTE — Assessment & Plan Note (Signed)
Unchanged.  No improvement in sxs x2 months.  Refer to ortho.

## 2013-07-04 LAB — VITAMIN D 1,25 DIHYDROXY
Vitamin D 1, 25 (OH)2 Total: 39 pg/mL (ref 18–72)
Vitamin D2 1, 25 (OH)2: <8 pg/mL
Vitamin D3 1, 25 (OH)2: 39 pg/mL

## 2013-07-13 ENCOUNTER — Telehealth: Payer: Self-pay | Admitting: *Deleted

## 2013-07-13 NOTE — Telephone Encounter (Signed)
LM @ (11:48am) asking the pt to RTC regarding information for physical form.//AB/CMA

## 2013-07-13 NOTE — Telephone Encounter (Signed)
Spoke with the pt and informed her that we need a wrist measurement for her and her husband for their health form.   Pt stated that she was at the beach and she will call me when she gets back on Monday.  Pt gave me her husbands measurement but will call back on Monday with her measurement.//AB/CMA

## 2013-07-19 ENCOUNTER — Ambulatory Visit (INDEPENDENT_AMBULATORY_CARE_PROVIDER_SITE_OTHER): Payer: 59 | Admitting: Cardiology

## 2013-07-19 ENCOUNTER — Encounter: Payer: Self-pay | Admitting: Cardiology

## 2013-07-19 VITALS — BP 112/70 | HR 50 | Ht 66.5 in | Wt 163.1 lb

## 2013-07-19 DIAGNOSIS — Z8674 Personal history of sudden cardiac arrest: Secondary | ICD-10-CM

## 2013-07-19 DIAGNOSIS — R5381 Other malaise: Secondary | ICD-10-CM

## 2013-07-19 DIAGNOSIS — I2510 Atherosclerotic heart disease of native coronary artery without angina pectoris: Secondary | ICD-10-CM

## 2013-07-19 DIAGNOSIS — R079 Chest pain, unspecified: Secondary | ICD-10-CM | POA: Insufficient documentation

## 2013-07-19 DIAGNOSIS — R5383 Other fatigue: Secondary | ICD-10-CM | POA: Insufficient documentation

## 2013-07-19 DIAGNOSIS — E785 Hyperlipidemia, unspecified: Secondary | ICD-10-CM

## 2013-07-19 DIAGNOSIS — I252 Old myocardial infarction: Secondary | ICD-10-CM

## 2013-07-19 MED ORDER — ROSUVASTATIN CALCIUM 10 MG PO TABS: 10.0000 mg | ORAL_TABLET | Freq: Every day | ORAL | Status: DC

## 2013-07-19 NOTE — Progress Notes (Signed)
07/19/2013   PCP: Neena Rhymes, MD   Chief Complaint  Patient presents with  . fatigue    "dragging" x few weeks; pain in L side happens about once every day/every other day; OV with Tabori & labs 8/1    Primary Cardiologist: [name]  HPI:  57 year old married white female mother of 3 is here today with complaints of extreme fatigue. 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 fib/V. tach arrest requiring defibrillation. She also received CPR by her husband. Post PCI with a bare-metal stent to the totally occluded proximal RCA she was in cardiogenic shock and had an intra-aortic balloon pump placed. She was also on the Arctic sun protocol. She has done remarkably well since that initial presentation.  She has been in a good state of health but over the last 2 weeks she's had increasing fatigue last night she went to bed about 7:15 PM awoke at 7 AM today and she feels extremely tired as if she could not sleep at all.  She does have some mild occasional pain under left breast but not very frequently and no associated symptoms of nausea diaphoresis or shortness of breath.  Prior to her arrest she had neck pain, back pain that was uncomfortable.  She had a negative stress test prior to that arrest as well though it was not a nuclear stress test.  Recent labs reveal normal hemoglobin normal electrolytes and normal TSH. Her LDL was elevated at 109 up from 50. She is anxious about this fatigue as she has not felt this way and is usually quite active.  No Known Allergies  Current Outpatient Prescriptions  Medication Sig Dispense Refill  . albuterol (PROVENTIL HFA;VENTOLIN HFA) 108 (90 BASE) MCG/ACT inhaler Inhale 2 puffs into the lungs every 4 (four) hours as needed for wheezing.  1 Inhaler  1  . ALPRAZolam (XANAX) 0.25 MG tablet Take 1 tablet (0.25 mg total) by mouth at bedtime as needed.  30 tablet  3  . aspirin 81 MG tablet Take 81 mg by  mouth 3 (three) times daily.       . cetirizine (ZYRTEC) 5 MG tablet Take 5 mg by mouth daily.        . clopidogrel (PLAVIX) 75 MG tablet Take 75 mg by mouth daily.        . cyclobenzaprine (FLEXERIL) 10 MG tablet Take 1 tablet (10 mg total) by mouth 3 (three) times daily as needed for muscle spasms.  30 tablet  0  . lisinopril (PRINIVIL,ZESTRIL) 10 MG tablet Take 1 tablet (10 mg total) by mouth daily.  30 tablet  6  . magnesium gluconate (MAGONATE) 500 MG tablet Take 500 mg by mouth daily.      . metoprolol (LOPRESSOR) 50 MG tablet Take 1 tablet (50 mg total) by mouth 2 (two) times daily.  60 tablet  11  . mometasone (NASONEX) 50 MCG/ACT nasal spray Place 2 sprays into the nose daily.  17 g  3  . pantoprazole (PROTONIX) 40 MG tablet Take 40 mg by mouth 2 (two) times daily.        . potassium chloride SA (K-DUR,KLOR-CON) 20 MEQ tablet Take 1 tablet (20 mEq total) by mouth daily.  30 tablet  6  . rosuvastatin (CRESTOR) 10 MG tablet Take 1 tablet (10 mg total) by mouth daily.  30 tablet  6   No current facility-administered medications for this visit.  Past Medical History  Diagnosis Date  . Myocardial infarct 02/15/10  . Hyperlipidemia   . Hypertension   . Clotting disorder   . H/O cardiac arrest 02/15/10    with STEMI- Inf wall  . CAD (coronary artery disease) 2011    with stent to RCA and 60 % LAD disease  . Shock, cardiogenic 01/2010    with MI, IABP    Past Surgical History  Procedure Laterality Date  . Appendectomy  1987  . Hernia repair  1980    double bilateral  . Abdominal hysterectomy  1987    BSO  . Exploratory laparotomy    . Right chest wall exploration    . Mass excision  09/02/2012    Procedure: EXCISION MASS;  Surgeon: Velora Heckler, MD;  Location: [facility];  Service: General;  Laterality: Left;  Excise mass Left posterior neck  . Coronary angioplasty with stent placement  02/15/10    Stent to prox RCA -BMS    ZOX:WRUEAVW:UJ colds or fevers, no  weight changes Skin:no rashes or ulcers HEENT:no blurred vision, no congestion CV:see HPI PUL:see HPI GI: she has had watery stools since arrest, no constipation or melena, no indigestion GU:no hematuria, no dysuria MS:no joint pain, no claudication, states her legs have never been as strong as they were before arrest. Neuro:no syncope, no lightheadedness Endo:no diabetes, no thyroid disease  PHYSICAL EXAM BP 112/70  Pulse 50  Ht 5' 6.5" (1.689 m)  Wt 163 lb 1.6 oz (73.982 kg)  BMI 25.93 kg/m2 General:Pleasant affect, NAD Skin:Warm and dry, brisk capillary refill HEENT:normocephalic, sclera clear, mucus membranes moist Neck:supple, no JVD, no bruits  Heart:S1S2 RRR without murmur, gallup, rub or click Lungs:clear without rales, rhonchi, occ wheeze WJX:BJYN, non tender, + BS, do not palpate liver spleen or masses Ext:no lower ext edema, 2+ pedal pulses, 2+ radial pulses Neuro:alert and oriented, MAE, follows commands, + facial symmetry  EKG: Sinus per cardiac rate of 50 a flipped T wave in lead III which is new from last EKG but older tracings  have the same flipped T wave otherwise no acute changes.  ASSESSMENT AND PLAN Chest pain, under lt breast, mild, no associated symptoms occ. Mild chest pain, though greatest complaint is fatigue.  No acute EKG changes, flipped T wave in III, though older EKGs similar.  Discussed with [name] who spoke to the pt also, plan for [facility]: [facility] W this week or first of next.  Follow up with me, on day [name] is in the office.  Fatigue Extreme fatigue, ? Anginal equivalent.  Occ pain under Lt breast.  But fatigue is greatest complaint in pt with hx of STEMI with cardiac arrest and cardiogenic shock.      CAD (coronary atherosclerotic disease), STEMI 2011 with emergent BMS to RCA, residual 60 % LAD disease.  Associated V fib arrest. Plan as above.  HYPERLIPIDEMIA LDL is 109, we will increase Crestor to 10 mg daily.

## 2013-07-19 NOTE — Assessment & Plan Note (Signed)
Plan as above.  

## 2013-07-19 NOTE — Assessment & Plan Note (Signed)
Extreme fatigue, ? Anginal equivalent.  Occ pain under Lt breast.  But fatigue is greatest complaint in pt with hx of STEMI with cardiac arrest and cardiogenic shock.

## 2013-07-19 NOTE — Patient Instructions (Signed)
If symptoms increase call us or go to ER if severe.  We increased your Crestor to 10 mg daily  We will schedule you for Lexiscan myoview this week or the first of next.  You will follow up with Nada Boozer, NP when [name] is in the office.  If a positive test we will call you.    Nothing to eat after midnight night before the study unless directed otherwise.

## 2013-07-19 NOTE — Assessment & Plan Note (Signed)
LDL is 109, we will increase Crestor to 10 mg daily.

## 2013-07-19 NOTE — Assessment & Plan Note (Signed)
Mild chest pain, though greatest complaint is fatigue.  No acute EKG changes, flipped T wave in III, though older EKGs similar.  Discussed with [name] who spoke to the pt also, plan for [facility] this week or first of next.  Follow up with me, on day [name] is in the office.

## 2013-07-20 ENCOUNTER — Ambulatory Visit (HOSPITAL_COMMUNITY)
Admission: RE | Admit: 2013-07-20 | Discharge: 2013-07-20 | Disposition: A | Discharge status: Home / Self Care | Payer: 59 | Source: Ambulatory Visit | Attending: Internal Medicine | Admitting: Internal Medicine

## 2013-07-20 DIAGNOSIS — R002 Palpitations: Secondary | ICD-10-CM | POA: Insufficient documentation

## 2013-07-20 DIAGNOSIS — Z8674 Personal history of sudden cardiac arrest: Secondary | ICD-10-CM

## 2013-07-20 DIAGNOSIS — J45909 Unspecified asthma, uncomplicated: Secondary | ICD-10-CM | POA: Insufficient documentation

## 2013-07-20 DIAGNOSIS — Z9861 Coronary angioplasty status: Secondary | ICD-10-CM | POA: Insufficient documentation

## 2013-07-20 DIAGNOSIS — R079 Chest pain, unspecified: Secondary | ICD-10-CM

## 2013-07-20 DIAGNOSIS — I252 Old myocardial infarction: Secondary | ICD-10-CM

## 2013-07-20 DIAGNOSIS — R5381 Other malaise: Secondary | ICD-10-CM | POA: Insufficient documentation

## 2013-07-20 DIAGNOSIS — Z8249 Family history of ischemic heart disease and other diseases of the circulatory system: Secondary | ICD-10-CM | POA: Insufficient documentation

## 2013-07-20 DIAGNOSIS — Z87891 Personal history of nicotine dependence: Secondary | ICD-10-CM | POA: Insufficient documentation

## 2013-07-20 DIAGNOSIS — I2510 Atherosclerotic heart disease of native coronary artery without angina pectoris: Secondary | ICD-10-CM | POA: Insufficient documentation

## 2013-07-20 DIAGNOSIS — E663 Overweight: Secondary | ICD-10-CM | POA: Insufficient documentation

## 2013-07-20 DIAGNOSIS — R5383 Other fatigue: Secondary | ICD-10-CM

## 2013-07-20 DIAGNOSIS — I10 Essential (primary) hypertension: Secondary | ICD-10-CM | POA: Insufficient documentation

## 2013-07-20 MED ORDER — TECHNETIUM TC 99M SESTAMIBI GENERIC - CARDIOLITE
30.1000 | Freq: Once | INTRAVENOUS | Status: AC | PRN
  Administered 2013-07-20: 30.1 via INTRAVENOUS

## 2013-07-20 MED ORDER — REGADENOSON 0.4 MG/5ML IV SOLN
0.4000 mg | Freq: Once | INTRAVENOUS | Status: AC
  Administered 2013-07-20: 0.4 mg via INTRAVENOUS

## 2013-07-20 MED ORDER — AMINOPHYLLINE 25 MG/ML IV SOLN
75.0000 mg | Freq: Once | INTRAVENOUS | Status: AC
  Administered 2013-07-20: 75 mg via INTRAVENOUS

## 2013-07-20 MED ORDER — TECHNETIUM TC 99M SESTAMIBI GENERIC - CARDIOLITE
10.6000 | Freq: Once | INTRAVENOUS | Status: AC | PRN
  Administered 2013-07-20: 11 via INTRAVENOUS

## 2013-07-20 NOTE — Procedures (Addendum)
[facility] [address] [place] [address] [phone]  Cardiology Nuclear Med Study  Vicki [name] is a 57 y.o. female     MRN : [id number]     DOB: [birth year]  Procedure Date: 07/20/2013  Nuclear Med Background Indication for Stress Test:  Stent Patency and Abnormal EKG History:  Asthma and CAD;MI-01/2010;STENT/PTCA--01/2010;VF/VT--CARDIAC ARREST;IABP Cardiac Risk Factors: Family History - CAD, History of Smoking, Hypertension, Lipids and Overweight  Symptoms:  Chest Pain, Fatigue and Palpitations   Nuclear Pre-Procedure Caffeine/Decaff Intake:  7:00pm NPO After: 5:00am   IV Site: R Hand  IV 0.9% NS with Angio Cath:  22g  Chest Size (in):  N/A IV Started by: Emmit Pomfret, RN  Height: 5' 6.5" (1.689 m)  Cup Size: DD  BMI:  Body mass index is 25.92 kg/(m^2). Weight:  163 lb (73.936 kg)   Tech Comments:  N/A    Nuclear Med Study 1 or 2 day study: 1 day  Stress Test Type:  Lexiscan  Order Authorizing Provider:  Obie Dredge   Resting Radionuclide: Technetium 24m Sestamibi  Resting Radionuclide Dose: 10.6 mCi   Stress Radionuclide:  Technetium 63m Sestamibi  Stress Radionuclide Dose: 30.1 mCi           Stress Protocol Rest HR: 53 Stress HR: 107  Rest BP:130/98 Stress BP: 125/101  Exercise Time (min): n/a METS: n/a          Dose of Adenosine (mg):  n/a Dose of Lexiscan: 0.4 mg  Dose of Atropine (mg): n/a Dose of Dobutamine: n/a mcg/kg/min (at max HR)  Stress Test Technologist: Ernestene Mention, CCT Nuclear Technologist: Gonzella Lex, CNMT   Rest Procedure:  Myocardial perfusion imaging was performed at rest 45 minutes following the intravenous administration of Technetium 33m Sestamibi. Stress Procedure:  The patient received IV Lexiscan 0.4 mg over 15-seconds.  Technetium 48m Sestamibi injected at 30-seconds.  Due to patient's shortness of breath, she was given IV Aminophylline 75 mg. Symptoms resolved  during recovery. There were no significant changes with Lexiscan.  Quantitative spect images were obtained after a 45 minute delay.  Transient Ischemic Dilatation (Normal <1.22):  0.93 Lung/Heart Ratio (Normal <0.45):  0.33 QGS EDV:  86 ml QGS ESV:  36 ml LV Ejection Fraction: 58%  Rest ECG: Sinus bradycardia with pvc;'s  Stress ECG: There are scattered PVCs.  QPS Raw Data Images:  Normal; no motion artifact; normal heart/lung ratio. Stress Images:  Normal homogeneous uptake in all areas of the myocardium. Rest Images:  Normal homogeneous uptake in all areas of the myocardium. Subtraction (SDS):  No ischemia. There is bwel underlying the inferoseptal wall, but it does not appear to shadow the myocardium.  Impression Exercise Capacity:  Lexiscan with no exercise. BP Response:  Normal blood pressure response. Clinical Symptoms:  There is dyspnea. ECG Impression:  There are scattered PVCs. Comparison with Prior Nuclear Study: No previous nuclear study performed  Overall Impression:  Normal stress nuclear study.  LV Wall Motion:  NL LV Function; NL Wall Motion; EF 58.  Chrystie Nose, MD, [facility] Board Certified in Nuclear Cardiology Attending Cardiologist [facility]  Chrystie Nose, MD  07/20/2013 1:41 PM

## 2013-07-21 ENCOUNTER — Telehealth: Payer: Self-pay | Admitting: Cardiovascular Disease

## 2013-07-21 ENCOUNTER — Encounter: Payer: Self-pay | Admitting: *Deleted

## 2013-07-21 NOTE — Telephone Encounter (Signed)
Pt was calling stating that she was supposed to receive a call from either you or [name] last night or this morning regarding her test yesterday and was concerned because she has not heard anything yet.

## 2013-07-21 NOTE — Telephone Encounter (Signed)
Results given.

## 2013-07-24 ENCOUNTER — Other Ambulatory Visit: Payer: Self-pay | Admitting: Cardiovascular Disease

## 2013-07-26 ENCOUNTER — Ambulatory Visit (INDEPENDENT_AMBULATORY_CARE_PROVIDER_SITE_OTHER): Payer: 59 | Admitting: Cardiology

## 2013-07-26 ENCOUNTER — Encounter: Payer: Self-pay | Admitting: Cardiology

## 2013-07-26 ENCOUNTER — Encounter: Payer: Self-pay | Admitting: Cardiovascular Disease

## 2013-07-26 VITALS — BP 140/80 | HR 60 | Ht 67.0 in | Wt 163.0 lb

## 2013-07-26 DIAGNOSIS — R5381 Other malaise: Secondary | ICD-10-CM

## 2013-07-26 DIAGNOSIS — I10 Essential (primary) hypertension: Secondary | ICD-10-CM

## 2013-07-26 DIAGNOSIS — I2510 Atherosclerotic heart disease of native coronary artery without angina pectoris: Secondary | ICD-10-CM

## 2013-07-26 DIAGNOSIS — R5383 Other fatigue: Secondary | ICD-10-CM

## 2013-07-26 MED ORDER — NITROGLYCERIN 0.4 MG SL SUBL: 0.4000 mg | SUBLINGUAL_TABLET | SUBLINGUAL | Status: DC | PRN

## 2013-07-26 NOTE — Patient Instructions (Signed)
Take 1 NTG, under your tongue, while sitting.  If no relief of pain may repeat NTG, one tab every 5 minutes up to 3 tablets total over 15 minutes.  If no relief CALL 911.  If you have dizziness/lightheadness  while taking NTG, stop taking and call 911.       Call if any concerns.  Follow up with me in 5-6 weeks and [name] in 4 months.

## 2013-07-26 NOTE — Progress Notes (Signed)
Patient ID: Vicki [name], female   DOB: July 03, 1956, 57 y.o.   MRN: [id number]       07/26/2013   PCP: Neena Rhymes, MD   Chief Complaint  Patient presents with  . Follow-up    stress test last thursday    Primary Cardiologist: [name]  HPI: 57 year old married white female mother of 3 was recently seen with complaints of extreme fatigue. 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 fib/V. tach arrest requiring defibrillation. She also received CPR by her husband. Post PCI with a bare-metal stent to the totally occluded proximal RCA she was in cardiogenic shock and had an intra-aortic balloon pump placed. She was also on the Arctic sun protocol. She has done remarkably well since that initial presentation.   She had been in a good state of health but over the last 2 weeks she's had increased fatigue last night she went to bed about 7:15 PM awoke at 7 AM today and she feels extremely tired as if she could not sleep at all. She does have some mild occasional pain under left breast but not very frequently and no associated symptoms of nausea diaphoresis or shortness of breath. Prior to her arrest she had neck pain, back pain that was uncomfortable. She had a negative stress test prior to that arrest as well though it was not a nuclear stress test.   Recent labs reveal normal hemoglobin normal electrolytes and normal TSH. Her LDL was elevated at 109 up from 50. She is anxious about this fatigue as she has not felt this way and is usually quite active.   She is back today for results of her nuclear stress test. The nuclear stress test was negative for ischemia. Her symptoms have improved. She is still tired but not as tired as she has been.  She has no neck or back pain that was present prior to her arrest.  She did have nausea and vomiting this morning thought to be secondary to taking her meds without much on her stomach.  Currently that is resolved.       No Known Allergies  Current Outpatient Prescriptions  Medication Sig Dispense Refill  . ALPRAZolam (XANAX) 0.25 MG tablet Take 1 tablet (0.25 mg total) by mouth at bedtime as needed.  30 tablet  3  . aspirin 81 MG tablet Take 81 mg by mouth 3 (three) times daily.       . cetirizine (ZYRTEC) 5 MG tablet Take 5 mg by mouth daily.        . clopidogrel (PLAVIX) 75 MG tablet Take 75 mg by mouth daily.        . cyclobenzaprine (FLEXERIL) 10 MG tablet Take 1 tablet (10 mg total) by mouth 3 (three) times daily as needed for muscle spasms.  30 tablet  0  . lisinopril (PRINIVIL,ZESTRIL) 10 MG tablet Take 1 tablet (10 mg total) by mouth daily.  30 tablet  6  . magnesium gluconate (MAGONATE) 500 MG tablet Take 500 mg by mouth daily.      . metoprolol (LOPRESSOR) 50 MG tablet Take 1 tablet (50 mg total) by mouth 2 (two) times daily.  60 tablet  11  . pantoprazole (PROTONIX) 40 MG tablet TAKE 1 TABLET BY MOUTH TWICE DAILY  60 tablet  6  . potassium chloride SA (K-DUR,KLOR-CON) 20 MEQ tablet Take 1 tablet (20 mEq total) by mouth daily.  30 tablet  6  . rosuvastatin (  CRESTOR) 10 MG tablet Take 1 tablet (10 mg total) by mouth daily.  30 tablet  6  . albuterol (PROVENTIL HFA;VENTOLIN HFA) 108 (90 BASE) MCG/ACT inhaler Inhale 2 puffs into the lungs every 4 (four) hours as needed for wheezing.  1 Inhaler  1  . mometasone (NASONEX) 50 MCG/ACT nasal spray Place 2 sprays into the nose daily.  17 g  3  . nitroGLYCERIN (NITROSTAT) 0.4 MG SL tablet Place 1 tablet (0.4 mg total) under the tongue every 5 (five) minutes as needed for chest pain.  50 tablet  4   No current facility-administered medications for this visit.    Past Medical History  Diagnosis Date  . Myocardial infarct 02/15/10  . Hyperlipidemia   . Hypertension   . H/O cardiac arrest 02/15/10    with STEMI- Inf wall  . Shock, cardiogenic 01/2010    with MI, IABP  . Clotting disorder   . CAD (coronary artery disease) 2011    with stent to RCA and  60 % LAD disease    Past Surgical History  Procedure Laterality Date  . Appendectomy  1987  . Hernia repair  1980    double bilateral  . Abdominal hysterectomy  1987    BSO  . Exploratory laparotomy    . Right chest wall exploration    . Mass excision  09/02/2012    Procedure: EXCISION MASS;  Surgeon: Velora Heckler, MD;  Location:  S[facility]ERY [facility];  Service: General;  Laterality: Left;  Excise mass Left posterior neck  . Coronary angioplasty with stent placement  02/15/10    Stent to prox RCA -BMS  . Doppler echocardiography  05/06/2010    EF =50-55%  lvfx low normal ;no sigificant valvular disease seen  . Nm myocar perf wall motion  05/06/2010    exercise cap 8 mets. ,mild perfusion defect in basal infer.,mid infer., apical infer., region consistent with infarct/scar. no significant ischemia    ZOX:WRUEAVW:UJ colds or fevers, no weight changes Skin:no rashes or ulcers HEENT:no blurred vision, no congestion CV:see HPI PUL:see HPI GI:no diarrhea constipation or melena, no indigestion GU:no hematuria, no dysuria MS:no joint pain, no claudication Neuro:no syncope, no lightheadedness Endo:no diabetes, no thyroid disease  PHYSICAL EXAM BP 140/80  Pulse 60  Ht 5\' 7"  (1.702 m)  Wt 73.936 kg (163 lb)  BMI 25.52 kg/m2 General:Pleasant affect, NAD Skin:Warm and dry, brisk capillary refill HEENT:normocephalic, sclera clear, mucus membranes moist Neck:supple, no JVD, no bruits  Heart:S1S2 RRR without murmur, gallup, rub or click Lungs:clear without rales, rhonchi, or wheezes WJX:BJYN, non tender, + BS, do not palpate liver spleen or masses Ext:no lower ext edema, 2+ pedal pulses, 2+ radial pulses Neuro:alert and oriented, MAE, follows commands, + facial symmetry   ASSESSMENT AND PLAN Fatigue Improved fatigue from last visit, though still present.  Reviewed normal stress test with the patient. [name] discussed her symptoms with her,  at this point we will continue  with current therapy if her symptoms increase with neck pain or further symptoms he would have no hesitation to proceed with cardiac catheterization.  She was agreeable to proceed with this plan.  CAD (coronary atherosclerotic disease), STEMI 2011 with emergent BMS to RCA, residual 60 % LAD disease.  Associated V fib arrest. Currently stable with negative nuclear stress test.  HYPERTENSION Controlled

## 2013-07-26 NOTE — Assessment & Plan Note (Signed)
Controlled.  

## 2013-07-26 NOTE — Assessment & Plan Note (Addendum)
Currently stable with negative nuclear stress test. Refilled her NTG

## 2013-07-26 NOTE — Assessment & Plan Note (Signed)
Improved fatigue from last visit, though still present.  Reviewed normal stress test with the patient. [name] discussed her symptoms with her,  at this point we will continue with current therapy if her symptoms increase with neck pain or further symptoms he would have no hesitation to proceed with cardiac catheterization.  She was agreeable to proceed with this plan.

## 2013-08-27 ENCOUNTER — Other Ambulatory Visit: Payer: Self-pay | Admitting: Family Medicine

## 2013-08-28 NOTE — Telephone Encounter (Signed)
Ok for #30, 3 refills but needs controlled substance agreement and UDS.

## 2013-08-28 NOTE — Telephone Encounter (Signed)
Pt was last seen on 06/30/2013, Last filled on 02/22/2013 #30, 3 refills, no contract or UDS on file. Please advise. SW

## 2013-08-28 NOTE — Telephone Encounter (Signed)
Med filled and printed. Office will notify pt on need to come in to pick up Rx.

## 2013-09-07 ENCOUNTER — Other Ambulatory Visit: Payer: Self-pay

## 2013-09-07 DIAGNOSIS — Z1231 Encounter for screening mammogram for malignant neoplasm of breast: Secondary | ICD-10-CM

## 2013-09-11 ENCOUNTER — Other Ambulatory Visit: Payer: Self-pay | Admitting: Family Medicine

## 2013-09-12 NOTE — Telephone Encounter (Signed)
Spoke with the pharmacy, they never received the fax from 9/28. Called in Rx #30 with 3 refills as it had been written that day.

## 2013-09-13 ENCOUNTER — Encounter: Payer: Self-pay | Admitting: Cardiology

## 2013-09-13 ENCOUNTER — Ambulatory Visit (INDEPENDENT_AMBULATORY_CARE_PROVIDER_SITE_OTHER): Payer: 59 | Admitting: Cardiology

## 2013-09-13 VITALS — BP 128/90 | HR 50 | Ht 66.0 in | Wt 164.0 lb

## 2013-09-13 DIAGNOSIS — R001 Bradycardia, unspecified: Secondary | ICD-10-CM

## 2013-09-13 DIAGNOSIS — I10 Essential (primary) hypertension: Secondary | ICD-10-CM

## 2013-09-13 DIAGNOSIS — I498 Other specified cardiac arrhythmias: Secondary | ICD-10-CM

## 2013-09-13 DIAGNOSIS — I2510 Atherosclerotic heart disease of native coronary artery without angina pectoris: Secondary | ICD-10-CM

## 2013-09-13 MED ORDER — METOPROLOL TARTRATE 50 MG PO TABS: 25.0000 mg | ORAL_TABLET | Freq: Two times a day (BID) | ORAL | Status: DC

## 2013-09-13 NOTE — Patient Instructions (Signed)
Decrease Metoprolol Tartrate to 25 mg twice a day ( half a 50 mg tab, twice a day)  See [name] back in 1 month to follow up pulse and BP.

## 2013-09-13 NOTE — Assessment & Plan Note (Signed)
HR 50 today, looking back it has been 50 in the past, she stated it can be very low at home.  With her complaints of fatigue I am decreasing to 25 mg twice a day.  She has a history of flutters so if these increase with decrease in BB she will notify us and I would change lopressor to 25 every 8 hours.

## 2013-09-13 NOTE — Progress Notes (Signed)
09/13/2013   PCP: Neena Rhymes, MD   Chief Complaint  Patient presents with  . Follow-up    6 week follow up    Primary Cardiologist: [name]  HPI:57 year old married white female mother of 3 was recently seen with complaints of extreme fatigue. 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 fib/V. tach arrest requiring defibrillation. She also received CPR by her husband. Post PCI with a bare-metal stent to the totally occluded proximal RCA she was in cardiogenic shock and had an intra-aortic balloon pump placed. She was also on the [place] sun protocol. She has done remarkably well since that initial presentation.   Recently seen for increased fatigue Recent labs reveal normal hemoglobin normal electrolytes and normal TSH. Her LDL was elevated at 109 up from 50. She is anxious about this fatigue as she has not felt this way and is usually quite active.  A Lexiscan myoview was completed and was negative for ischemia. Her symptoms have improved. She still has fatigue.  She has no neck or back pain that was present prior to her arrest. She has a headache she has had for a few days, and takes occ. Tyelnol.  BP mildly elevated.  Yesterday had rt elbow injected for tendonitis.  She tells me she has history of "fluttering" in her chest.     No Known Allergies  Current Outpatient Prescriptions  Medication Sig Dispense Refill  . ALPRAZolam (XANAX) 0.25 MG tablet TAKE 1 TABLET BY MOUTH EVERY NIGHT AT BEDTIME AS NEEDED  30 tablet  3  . aspirin 81 MG tablet Take 81 mg by mouth 3 (three) times daily.       . cetirizine (ZYRTEC) 5 MG tablet Take 5 mg by mouth daily.        . clopidogrel (PLAVIX) 75 MG tablet Take 75 mg by mouth daily.        Marland Kitchen lisinopril (PRINIVIL,ZESTRIL) 10 MG tablet Take 1 tablet (10 mg total) by mouth daily.  30 tablet  6  . magnesium gluconate (MAGONATE) 500 MG tablet Take 500 mg by mouth daily.      . metoprolol  (LOPRESSOR) 50 MG tablet Take 0.5 tablets (25 mg total) by mouth 2 (two) times daily.  60 tablet  11  . nitroGLYCERIN (NITROSTAT) 0.4 MG SL tablet Place 1 tablet (0.4 mg total) under the tongue every 5 (five) minutes as needed for chest pain.  50 tablet  4  . pantoprazole (PROTONIX) 40 MG tablet TAKE 1 TABLET BY MOUTH TWICE DAILY  60 tablet  6  . potassium chloride SA (K-DUR,KLOR-CON) 20 MEQ tablet Take 1 tablet (20 mEq total) by mouth daily.  30 tablet  6  . rosuvastatin (CRESTOR) 10 MG tablet Take 1 tablet (10 mg total) by mouth daily.  30 tablet  6   No current facility-administered medications for this visit.    Past Medical History  Diagnosis Date  . Myocardial infarct 02/15/10  . Hyperlipidemia   . Hypertension   . H/O cardiac arrest 02/15/10    with STEMI- Inf wall  . Shock, cardiogenic 01/2010    with MI, IABP  . Clotting disorder   . CAD (coronary artery disease) 2011    with stent to RCA and 60 % LAD disease    Past Surgical History  Procedure Laterality Date  . Appendectomy  1987  . Hernia repair  1980    double  bilateral  . Abdominal hysterectomy  1987    BSO  . Exploratory laparotomy    . Right chest wall exploration    . Mass excision  09/02/2012    Procedure: EXCISION MASS;  Surgeon: Velora Heckler, MD;  Location: [facility];  Service: General;  Laterality: Left;  Excise mass Left posterior neck  . Coronary angioplasty with stent placement  02/15/10    Stent to prox RCA -BMS  . Doppler echocardiography  05/06/2010    EF =50-55%  lvfx low normal ;no sigificant valvular disease seen  . Nm myocar perf wall motion  05/06/2010    exercise cap 8 mets. ,mild perfusion defect in basal infer.,mid infer., apical infer., region consistent with infarct/scar. no significant ischemia    WJX:BJYNWGN:FA colds or fevers, no weight changes, not sleeping worried about her husband's health possible cause of H/A Skin:no rashes or ulcers HEENT:no blurred vision, no  congestion CV:see HPI PUL:see HPI GI:no diarrhea constipation or melena, no indigestion GU:no hematuria, no dysuria MS:no joint pain, no claudication Neuro:no syncope, no lightheadedness, + headache for 2 days though not severe enough to take but occ tylenol, did not wish to take any other pain meds  Endo:no diabetes, no thyroid disease  PHYSICAL EXAM BP 128/90  Pulse 50  Ht 5\' 6"  (1.676 m)  Wt 164 lb (74.39 kg)  BMI 26.48 kg/m2 General:Pleasant affect, NAD Skin:Warm and dry, brisk capillary refill HEENT:normocephalic, sclera clear, mucus membranes moist Neck:supple, no JVD, no bruits  Heart:S1S2 RRR without murmur, gallup, rub or click Lungs:clear without rales, rhonchi, or wheezes OZH:YQMV, non tender, + BS, do not palpate liver spleen or masses Ext:no lower ext edema, 2+ pedal pulses, 2+ radial pulses Neuro:alert and oriented, MAE, follows commands, + facial symmetry EKG:S Brady, HR50 no acute changes otherwise.  ASSESSMENT AND PLAN Bradycardia, symptomatic with fatigue HR 50 today, looking back it has been 50 in the past, she stated it can be very low at home.  With her complaints of fatigue I am decreasing to 25 mg twice a day.  She has a history of flutters so if these increase with decrease in BB she will notify us and I would change lopressor to 25 every 8 hours.    CAD (coronary atherosclerotic disease), STEMI 2011 with emergent BMS to RCA, residual 60 % LAD disease.  Associated V fib arrest. Recent negative nuc study.  Only complains of fatigue.  BB decreased.  HYPERTENSION BP mildly elevated today, she will see [name] back in 1 month, for BP and HR check with decrease in BB.

## 2013-09-13 NOTE — Assessment & Plan Note (Signed)
BP mildly elevated today, she will see [name] back in 1 month, for BP and HR check with decrease in BB.

## 2013-09-13 NOTE — Assessment & Plan Note (Signed)
Recent negative nuc study.  Only complains of fatigue.  BB decreased.

## 2013-09-19 ENCOUNTER — Telehealth: Payer: Self-pay | Admitting: *Deleted

## 2013-09-19 NOTE — Telephone Encounter (Signed)
Dr Allyson Sabal reviewed chart and gave clearance to hold ASA prior to procedure, but patient must remain on Plavix.  Form faxed back to surgeon office.

## 2013-09-25 ENCOUNTER — Other Ambulatory Visit: Payer: Self-pay | Admitting: Cardiovascular Disease

## 2013-09-25 NOTE — Telephone Encounter (Signed)
Rx was sent to pharmacy electronically. 

## 2013-10-02 ENCOUNTER — Ambulatory Visit
Admission: RE | Admit: 2013-10-02 | Discharge: 2013-10-02 | Disposition: A | Discharge status: Home / Self Care | Payer: 59 | Source: Ambulatory Visit

## 2013-10-02 DIAGNOSIS — Z1231 Encounter for screening mammogram for malignant neoplasm of breast: Secondary | ICD-10-CM

## 2013-10-05 ENCOUNTER — Other Ambulatory Visit: Payer: Self-pay

## 2013-10-06 ENCOUNTER — Other Ambulatory Visit: Payer: Self-pay | Admitting: Family Medicine

## 2013-10-06 DIAGNOSIS — R928 Other abnormal and inconclusive findings on diagnostic imaging of breast: Secondary | ICD-10-CM

## 2013-10-19 ENCOUNTER — Ambulatory Visit (INDEPENDENT_AMBULATORY_CARE_PROVIDER_SITE_OTHER): Payer: 59 | Admitting: Cardiovascular Disease

## 2013-10-19 ENCOUNTER — Encounter: Payer: Self-pay | Admitting: Cardiovascular Disease

## 2013-10-19 VITALS — BP 118/71 | HR 60 | Ht 66.5 in | Wt 167.0 lb

## 2013-10-19 DIAGNOSIS — I2510 Atherosclerotic heart disease of native coronary artery without angina pectoris: Secondary | ICD-10-CM

## 2013-10-19 DIAGNOSIS — E785 Hyperlipidemia, unspecified: Secondary | ICD-10-CM

## 2013-10-19 DIAGNOSIS — I10 Essential (primary) hypertension: Secondary | ICD-10-CM

## 2013-10-19 DIAGNOSIS — Z23 Encounter for immunization: Secondary | ICD-10-CM

## 2013-10-19 NOTE — Assessment & Plan Note (Addendum)
The patient suffered an acute anterior wall myocardial infarction March 2011 with a razor shock and ventricular fibrillation requiring CPR, defibrillation and intervention on her dominant RCA. Chin in the recumbent Foley. She did have a 60% mid LAD lesion. She saw Delfin Gant in the office this past summer with fatigue which were similar to her pre-arrest symptoms and had a Myoview stress test performed in August which was entirely normal. Vicki [name] decreased her beta blocker dose and so her back last month. Symptoms have resolved. She denies chest pain or shortness of breath.

## 2013-10-19 NOTE — Assessment & Plan Note (Signed)
Controlled on current medications 

## 2013-10-19 NOTE — Progress Notes (Signed)
10/19/2013 Vicki [name]   1956-06-24  [id number]  Primary Physician Vicki Rhymes, MD Primary Cardiologist: Vicki Gess MD Vicki [name]   HPI:  The patient is a delightful 57 year old, mildly overweight married Caucasian female mother of 3, whose husband is also a patient of mine. I last saw her January 22, 2012. 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 She had an inferior STEMI with witnessed VF arrest, CPR, defibrillation and intervention requiring stenting in the dominant RCA by myself on February 15, 2010. This was complicated by cardiogenic shock with placement of an intraaortic balloon pump. She was an [facility] patient. 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 She has stopped smoking since that time, and her other problems include hyperlipidemia, well controlled on statin therapy. She was a smoker with a 35-pack-year history of tobacco abuse up until that time. She denies chest pain or shortness of breath. She is scheduled for surgical removal of what appears to be a sebaceous cyst in the operating room tomorrow by Dr. Darnell [name] under general anesthesia. She has not stopped her aspirin and Plavix at my request. She developed fatigue symptoms past summer and saw Vicki [name] registered nurse practitioner who performed a Myoview stress test that was normal. She downside to the beta blocker resulting in marked improvement in her symptoms. Since she saw one month ago she is completely asymptomatic.     Current Outpatient Prescriptions  Medication Sig Dispense Refill  . ALPRAZolam (XANAX) 0.25 MG tablet TAKE 1 TABLET BY MOUTH EVERY NIGHT AT BEDTIME AS NEEDED  30 tablet  3  . aspirin 81 MG tablet Take 81 mg by mouth 3 (three) times daily.       . cetirizine (ZYRTEC) 5 MG tablet Take 5 mg by mouth daily.        . clopidogrel (PLAVIX) 75 MG tablet TAKE 1 TABLET BY MOUTH EVERY DAY  30 tablet  6  . lisinopril (PRINIVIL,ZESTRIL) 10 MG tablet Take 1 tablet (10 mg total) by mouth  daily.  30 tablet  6  . magnesium gluconate (MAGONATE) 500 MG tablet Take 500 mg by mouth daily.      . metoprolol (LOPRESSOR) 50 MG tablet Take 0.5 tablets (25 mg total) by mouth 2 (two) times daily.  60 tablet  11  . nitroGLYCERIN (NITROSTAT) 0.4 MG SL tablet Place 1 tablet (0.4 mg total) under the tongue every 5 (five) minutes as needed for chest pain.  50 tablet  4  . pantoprazole (PROTONIX) 40 MG tablet TAKE 1 TABLET BY MOUTH TWICE DAILY  60 tablet  6  . potassium chloride SA (K-DUR,KLOR-CON) 20 MEQ tablet Take 1 tablet (20 mEq total) by mouth daily.  30 tablet  6  . rosuvastatin (CRESTOR) 10 MG tablet Take 1 tablet (10 mg total) by mouth daily.  30 tablet  6   No current facility-administered medications for this visit.    No Known Allergies  History   Social History  . Marital Status: Married    Spouse Name: N/A    Number of Children: 2  . Years of Education: N/A   Occupational History  . 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 Smoking status: Former Smoker    Quit date: 01/28/2010  . Smokeless tobacco: Never Used  . Alcohol Use: 0.0 oz/week    1-2 Glasses of wine per week  . Drug Use: No  . Sexual Activity: Not  on file   Other Topics Concern  . Not on file   Social History Narrative  . No narrative on file     Review of Systems: General: negative for chills, fever, night sweats or weight changes.  Cardiovascular: negative for chest pain, dyspnea on exertion, edema, orthopnea, palpitations, paroxysmal nocturnal dyspnea or shortness of breath Dermatological: negative for rash Respiratory: negative for cough or wheezing Urologic: negative for hematuria Abdominal: negative for nausea, vomiting, diarrhea, bright red blood per rectum, melena, or hematemesis Neurologic: negative for visual changes, syncope, or dizziness All other systems reviewed and are otherwise negative except as noted above.    Blood pressure 118/71, pulse 60, height  5' 6.5" (1.689 m), weight 167 lb (75.751 kg).  General appearance: alert and no distress Neck: no adenopathy, no carotid bruit, no JVD, supple, symmetrical, trachea midline and thyroid not enlarged, symmetric, no tenderness/mass/nodules Lungs: clear to auscultation bilaterally Heart: regular rate and rhythm, S1, S2 normal, no murmur, click, rub or gallop Extremities: extremities normal, atraumatic, no cyanosis or edema  EKG not performed today  ASSESSMENT AND PLAN:   CAD (coronary atherosclerotic disease), STEMI 2011 with emergent BMS to RCA, residual 60 % LAD disease.  Associated V fib arrest. The patient suffered an acute anterior wall myocardial infarction March 2011 with a razor shock and ventricular fibrillation requiring CPR, defibrillation and intervention on her dominant RCA. Chin in the recumbent Foley. She did have a 60% mid LAD lesion. She saw Vicki [name] in the office this past summer with fatigue which were similar to her pre-arrest symptoms and had a Myoview stress test performed in August which was entirely normal. Vicki [name] decreased her beta blocker dose and so her back last month. Symptoms have resolved. She denies chest pain or shortness of breath.  HYPERTENSION Controlled on current medications  HYPERLIPIDEMIA On statin therapy with a total cholesterol 184, LDL 109 HDL 50. Her rosuvastatin was increased from 5-10 mg a day. Her primary care physician is managing this.      Vicki Gess MD FACP,FACC,FAHA, [facility] 10/19/2013 5:37 PM

## 2013-10-19 NOTE — Patient Instructions (Addendum)
Your physician wants you to follow-up in: 6 months with Dr Allyson Sabal. You will receive a reminder letter in the mail two months in advance. If you don't receive a letter, please call our office to schedule the follow-up appointment.   Dr Allyson Sabal said that you are cleared to have a MRI with the type of stent that you have.

## 2013-10-19 NOTE — Assessment & Plan Note (Signed)
On statin therapy with a total cholesterol 184, LDL 109 HDL 50. Her rosuvastatin was increased from 5-10 mg a day. Her primary care physician is managing this.

## 2013-10-20 ENCOUNTER — Ambulatory Visit
Admission: RE | Admit: 2013-10-20 | Discharge: 2013-10-20 | Disposition: A | Discharge status: Home / Self Care | Payer: 59 | Source: Ambulatory Visit | Attending: Family Medicine | Admitting: Family Medicine

## 2013-10-20 DIAGNOSIS — R928 Other abnormal and inconclusive findings on diagnostic imaging of breast: Secondary | ICD-10-CM

## 2013-10-25 ENCOUNTER — Other Ambulatory Visit: Payer: 59

## 2013-11-18 ENCOUNTER — Emergency Department (HOSPITAL_BASED_OUTPATIENT_CLINIC_OR_DEPARTMENT_OTHER): Payer: 59

## 2013-11-18 ENCOUNTER — Encounter (HOSPITAL_BASED_OUTPATIENT_CLINIC_OR_DEPARTMENT_OTHER): Payer: Self-pay | Admitting: Emergency Medicine

## 2013-11-18 ENCOUNTER — Emergency Department (HOSPITAL_BASED_OUTPATIENT_CLINIC_OR_DEPARTMENT_OTHER)
Admission: EM | Admit: 2013-11-18 | Discharge: 2013-11-18 | Disposition: A | Discharge status: Home / Self Care | Payer: 59 | Attending: Emergency Medicine | Admitting: Emergency Medicine

## 2013-11-18 DIAGNOSIS — Z9861 Coronary angioplasty status: Secondary | ICD-10-CM | POA: Insufficient documentation

## 2013-11-18 DIAGNOSIS — S6991XA Unspecified injury of right wrist, hand and finger(s), initial encounter: Secondary | ICD-10-CM

## 2013-11-18 DIAGNOSIS — D689 Coagulation defect, unspecified: Secondary | ICD-10-CM | POA: Insufficient documentation

## 2013-11-18 DIAGNOSIS — S82002A Unspecified fracture of left patella, initial encounter for closed fracture: Secondary | ICD-10-CM

## 2013-11-18 DIAGNOSIS — Y9289 Other specified places as the place of occurrence of the external cause: Secondary | ICD-10-CM | POA: Insufficient documentation

## 2013-11-18 DIAGNOSIS — I2510 Atherosclerotic heart disease of native coronary artery without angina pectoris: Secondary | ICD-10-CM | POA: Insufficient documentation

## 2013-11-18 DIAGNOSIS — E785 Hyperlipidemia, unspecified: Secondary | ICD-10-CM | POA: Insufficient documentation

## 2013-11-18 DIAGNOSIS — I10 Essential (primary) hypertension: Secondary | ICD-10-CM | POA: Insufficient documentation

## 2013-11-18 DIAGNOSIS — W19XXXA Unspecified fall, initial encounter: Secondary | ICD-10-CM

## 2013-11-18 DIAGNOSIS — Z7982 Long term (current) use of aspirin: Secondary | ICD-10-CM | POA: Insufficient documentation

## 2013-11-18 DIAGNOSIS — Z7902 Long term (current) use of antithrombotics/antiplatelets: Secondary | ICD-10-CM | POA: Insufficient documentation

## 2013-11-18 DIAGNOSIS — Z8674 Personal history of sudden cardiac arrest: Secondary | ICD-10-CM | POA: Insufficient documentation

## 2013-11-18 DIAGNOSIS — I252 Old myocardial infarction: Secondary | ICD-10-CM | POA: Insufficient documentation

## 2013-11-18 DIAGNOSIS — S82009A Unspecified fracture of unspecified patella, initial encounter for closed fracture: Secondary | ICD-10-CM | POA: Insufficient documentation

## 2013-11-18 DIAGNOSIS — Y9301 Activity, walking, marching and hiking: Secondary | ICD-10-CM | POA: Insufficient documentation

## 2013-11-18 DIAGNOSIS — Z79899 Other long term (current) drug therapy: Secondary | ICD-10-CM | POA: Insufficient documentation

## 2013-11-18 DIAGNOSIS — S6990XA Unspecified injury of unspecified wrist, hand and finger(s), initial encounter: Secondary | ICD-10-CM | POA: Insufficient documentation

## 2013-11-18 DIAGNOSIS — Z87891 Personal history of nicotine dependence: Secondary | ICD-10-CM | POA: Insufficient documentation

## 2013-11-18 DIAGNOSIS — W010XXA Fall on same level from slipping, tripping and stumbling without subsequent striking against object, initial encounter: Secondary | ICD-10-CM | POA: Insufficient documentation

## 2013-11-18 DIAGNOSIS — S59909A Unspecified injury of unspecified elbow, initial encounter: Secondary | ICD-10-CM | POA: Insufficient documentation

## 2013-11-18 MED ORDER — TRAMADOL HCL 50 MG PO TABS: 50.0000 mg | ORAL_TABLET | Freq: Four times a day (QID) | ORAL | Status: DC | PRN

## 2013-11-18 MED ORDER — TRAMADOL HCL 50 MG PO TABS
50.0000 mg | ORAL_TABLET | Freq: Once | ORAL | Status: AC
  Administered 2013-11-18: 50 mg via ORAL
  Filled 2013-11-18: qty 1

## 2013-11-18 NOTE — ED Notes (Signed)
Patient tripped and fell onto hardwood floor this evening. Complains of right wrist and left knee pain. Swelling noted to both without deformity, no loc

## 2013-11-18 NOTE — ED Notes (Signed)
Ice pack given for home use. Pt medicated for pain with tramadol per order. Pt states she has not eaten, given graham crackers. Vicki [name], EMT applying knee immobilizer. Advised not to drink any etoh while taking tramadol

## 2013-11-18 NOTE — ED Provider Notes (Signed)
Medical screening examination/treatment/procedure(s) were performed by non-physician practitioner and as supervising physician I was immediately available for consultation/collaboration.  EKG Interpretation   None         William Dirk Vanaman, MD 11/18/13 2313 

## 2013-11-18 NOTE — ED Notes (Signed)
Rx x 1 given for tramadol. Pt has knee immobilizer in place. Ice pack x 2 given for home use. D/c home with ride

## 2013-11-18 NOTE — ED Provider Notes (Signed)
CSN: [id number]     Arrival date & time 11/18/13  1757 History   First MD Initiated Contact with Patient 11/18/13 1852     Chief Complaint  Patient presents with  . Fall   (Consider location/radiation/quality/duration/timing/severity/associated sxs/prior Treatment) HPI Comments: Patient is a 57 year old female who presents after a mechanical fall that occurred earlier today. Patient reports walking from her living room to the kitchen when she tripped on her slipper and landed on her right wrist and left knee. Patient denies head trauma or LOC. Since the fall, the patient reports throbbing pain at the right wrist and left knee that is moderate in the wrist and severe in the knee. The pain does not radiate. Patient reports associated swelling of the left knee. Patient applied ice to the knee for pain relief. Patient denies any other injuries.   Patient is a 57 y.o. female presenting with fall.  Fall Associated symptoms include arthralgias and joint swelling. Pertinent negatives include no abdominal pain, chest pain, chills, fatigue, fever, nausea, neck pain, vomiting or weakness.    Past Medical History  Diagnosis Date  . Myocardial infarct 02/15/10  . Hyperlipidemia   . Hypertension   . H/O cardiac arrest 02/15/10    with STEMI- Inf wall  . Shock, cardiogenic 01/2010    with MI, IABP  . Clotting disorder   . CAD (coronary artery disease) 2011    with stent to RCA and 60 % LAD disease   Past Surgical History  Procedure Laterality Date  . Appendectomy  1987  . Hernia repair  1980    double bilateral  . Abdominal hysterectomy  1987    BSO  . Exploratory laparotomy    . Right chest wall exploration    . Mass excision  09/02/2012    Procedure: EXCISION MASS;  Surgeon: Velora Heckler, MD;  Location: [facility];  Service: General;  Laterality: Left;  Excise mass Left posterior neck  . Coronary angioplasty with stent placement  02/15/10    Stent to prox RCA -BMS  . Doppler  echocardiography  05/06/2010    EF =50-55%  lvfx low normal ;no sigificant valvular disease seen  . Nm myocar perf wall motion  05/06/2010    exercise cap 8 mets. ,mild perfusion defect in basal infer.,mid infer., apical infer., region consistent with infarct/scar. no significant ischemia   Family History  Problem Relation Age of Onset  . Colon cancer Neg Hx   . Breast cancer Mother 13  . Cancer Mother     breast  . Cancer Father     colon  . Healthy Brother    History  Substance Use Topics  . Smoking status: Former Smoker    Quit date: 01/28/2010  . Smokeless tobacco: Never Used  . Alcohol Use: 0.0 oz/week    1-2 Glasses of wine per week   OB History   Grav Para Term Preterm Abortions TAB SAB Ect Mult Living                 Review of Systems  Constitutional: Negative for fever, chills and fatigue.  HENT: Negative for trouble swallowing.   Eyes: Negative for visual disturbance.  Respiratory: Negative for shortness of breath.   Cardiovascular: Negative for chest pain and palpitations.  Gastrointestinal: Negative for nausea, vomiting, abdominal pain and diarrhea.  Genitourinary: Negative for dysuria and difficulty urinating.  Musculoskeletal: Positive for arthralgias and joint swelling. Negative for neck pain.  Skin: Negative for  color change.  Neurological: Negative for dizziness and weakness.  Psychiatric/Behavioral: Negative for dysphoric mood.    Allergies  Review of patient's allergies indicates no known allergies.  Home Medications   Current Outpatient Rx  Name  Route  Sig  Dispense  Refill  . ALPRAZolam (XANAX) 0.25 MG tablet      TAKE 1 TABLET BY MOUTH EVERY NIGHT AT BEDTIME AS NEEDED   30 tablet   3   . aspirin 81 MG tablet   Oral   Take 81 mg by mouth 3 (three) times daily.          . cetirizine (ZYRTEC) 5 MG tablet   Oral   Take 5 mg by mouth daily.           . clopidogrel (PLAVIX) 75 MG tablet      TAKE 1 TABLET BY MOUTH EVERY DAY   30  tablet   6   . lisinopril (PRINIVIL,ZESTRIL) 10 MG tablet   Oral   Take 1 tablet (10 mg total) by mouth daily.   30 tablet   6   . magnesium gluconate (MAGONATE) 500 MG tablet   Oral   Take 500 mg by mouth daily.         . metoprolol (LOPRESSOR) 50 MG tablet   Oral   Take 0.5 tablets (25 mg total) by mouth 2 (two) times daily.   60 tablet   11   . nitroGLYCERIN (NITROSTAT) 0.4 MG SL tablet   Sublingual   Place 1 tablet (0.4 mg total) under the tongue every 5 (five) minutes as needed for chest pain.   50 tablet   4   . pantoprazole (PROTONIX) 40 MG tablet      TAKE 1 TABLET BY MOUTH TWICE DAILY   60 tablet   6   . potassium chloride SA (K-DUR,KLOR-CON) 20 MEQ tablet   Oral   Take 1 tablet (20 mEq total) by mouth daily.   30 tablet   6   . rosuvastatin (CRESTOR) 10 MG tablet   Oral   Take 1 tablet (10 mg total) by mouth daily.   30 tablet   6    BP 133/71  Pulse 77  Temp(Src) 99 F (37.2 C) (Oral)  Resp 18  SpO2 100% Physical Exam  Nursing note and vitals reviewed. Constitutional: She is oriented to person, place, and time. She appears well-developed and well-nourished. No distress.  HENT:  Head: Normocephalic and atraumatic.  Eyes: Conjunctivae are normal.  Cardiovascular: Normal rate, regular rhythm and intact distal pulses.  Exam reveals no gallop and no friction rub.   No murmur heard. Pulmonary/Chest: Effort normal and breath sounds normal. She has no wheezes. She has no rales. She exhibits no tenderness.  Abdominal: Soft. There is no tenderness.  Musculoskeletal: Normal range of motion.  Right wrist dorsal tenderness to palpation. No obvious deformity. No snuff box tenderness. ROM slightly limited due to pain.   Left knee edematous and bruised with anterior tenderness to palpation. ROM highly limited due to pain and swelling. No obvious deformity.   Neurological: She is alert and oriented to person, place, and time. Coordination normal.   Sensation intact distal to right wrist and left knee injury. Speech is goal-oriented. Moves limbs without ataxia.   Skin: Skin is warm and dry.  Psychiatric: She has a normal mood and affect. Her behavior is normal.    ED Course  Procedures (including critical care time) Labs Review Labs Reviewed - No  data to display Imaging Review Dg Wrist Complete Right  11/18/2013   CLINICAL DATA:  Pain post fall  EXAM: RIGHT WRIST - COMPLETE 3+ VIEW  COMPARISON:  None.  FINDINGS: Mild degenerative changes at the 1st [facility] joint. Negative for fracture or dislocation. Carpal rows intact. No other significant osseous degenerative change.  IMPRESSION: 1.  Negative for fracture or other acute bone abnormality. 2. 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   Electronically Signed   By: Oley Balm M.D.   On: 11/18/2013 19:23   Dg Knee Complete 4 Views Left  11/18/2013   CLINICAL DATA:  Pain post fall  EXAM: LEFT KNEE - COMPLETE 4+ VIEW  COMPARISON:  None.  FINDINGS: Transverse patellar fracture, distracted less than 1 mm. Minimal step-off deformity of the subchondral cortex. Moderate effusion in the suprapatellar bursa. Distal femur and proximal tibia intact. No dislocation. No significant osseous degenerative change. Mild osteopenia.  IMPRESSION: 1. Patellar fracture, minimally displaced, with associated joint effusion.   Electronically Signed   By: Oley Balm M.D.   On: 11/18/2013 19:20    EKG Interpretation   None       MDM   1. Fall, initial encounter   2. Patellar fracture, left, closed, initial encounter   3. Right wrist injury, initial encounter     8:12 PM Xray shows left patellar fracture. Wrist xray unremarkable for acute changes. No snuff box tenderness on right wrist exam. Vitals stable and patient afebrile. Patient will have knee immobilizer, crutches and recommended follow up with her Orthopedic doctor. Patient will have Tramadol for pain. No other injury. No neurovascular  compromise.     Emilia Beck, [place] 11/18/13 2024

## 2013-11-26 ENCOUNTER — Other Ambulatory Visit: Payer: Self-pay | Admitting: Family Medicine

## 2013-11-27 NOTE — Telephone Encounter (Signed)
Pt should have refills remaining- was given #30, w/ 3 refills in October (4 months worth of meds)

## 2013-11-27 NOTE — Telephone Encounter (Signed)
Last OV 06-30-13 Med filled 09-29-13 #30 with 3

## 2013-11-27 NOTE — Telephone Encounter (Signed)
Spoke with patient and she advised that she only uses the alprazolam once nightly and has helped her relax enough to sleep at night after her stroke.   Pt also advised that she fell 1 week ago stating she broke her knee, and sprained her wrist.

## 2013-11-27 NOTE — Telephone Encounter (Signed)
Please clarify w/ pt how often she is taking this.  Based on refills given, she is using more than 30/month

## 2013-11-27 NOTE — Telephone Encounter (Signed)
Spoke with pharmacy, her rx was accidentally canceled. Called in rx for remaining refills.

## 2014-01-12 ENCOUNTER — Encounter: Payer: Self-pay | Admitting: Family Medicine

## 2014-01-12 ENCOUNTER — Ambulatory Visit (INDEPENDENT_AMBULATORY_CARE_PROVIDER_SITE_OTHER): Payer: 59 | Admitting: Family Medicine

## 2014-01-12 VITALS — BP 126/80 | HR 71 | Temp 98.2°F | Resp 16 | Wt 168.4 lb

## 2014-01-12 DIAGNOSIS — R21 Rash and other nonspecific skin eruption: Secondary | ICD-10-CM | POA: Insufficient documentation

## 2014-01-12 DIAGNOSIS — N762 Acute vulvitis: Secondary | ICD-10-CM | POA: Insufficient documentation

## 2014-01-12 DIAGNOSIS — N760 Acute vaginitis: Secondary | ICD-10-CM

## 2014-01-12 NOTE — Progress Notes (Signed)
   Subjective:    Patient ID: Vicki [name], female    DOB: 1956-11-16, 58 y.o.   MRN: [id number]  HPI Rash- pt reports she will develop red spots on face and they will be present for a day and then clear only to appear in another location.  Not itchy.  No new products.  Swollen clitoris- sxs developed Tuesday night.  Not painful but 'very uncomfortable'.  Not itchy.  No new soaps, detergents, underwear, feminine products.  No sexual activity.   Review of Systems For ROS see HPI     Objective:   Physical Exam  Vitals reviewed. Constitutional: She appears well-developed and well-nourished. No distress.  Genitourinary:  Clitoris is swollen and indurated- almost cystlike feel  Skin: Skin is warm and dry. Rash (3 small red papules on bridge of nose, easily blanch, not itchy or painful) noted.          Assessment & Plan:

## 2014-01-12 NOTE — Assessment & Plan Note (Signed)
New.  Area is swollen and almost indurated- has a cyst-like feel when palpated.  Refer to gyn for management.  Pt expressed understanding and is in agreement w/ plan.

## 2014-01-12 NOTE — Assessment & Plan Note (Signed)
New.  Suspect this is either rosacea or stress related.  If no improvement w/ time, will refer to derm but as areas are resolving in 1 day or less, no need for treatment.

## 2014-01-12 NOTE — Patient Instructions (Signed)
Follow up as needed We'll call you with your GYN appt Make sure you are wearing cotton panties- not silk or satin (they don't breathe) Avoid tight fitting clothes The red dots on the face are most likely stress induced rosacea- don't overscrub and continue to moisturize Call with any questions or concerns Hang in there!!!

## 2014-01-12 NOTE — Progress Notes (Signed)
Pre visit review using our clinic review tool, if applicable. No additional management support is needed unless otherwise documented below in the visit note. 

## 2014-01-17 ENCOUNTER — Encounter: Payer: Self-pay | Admitting: Family Medicine

## 2014-01-17 NOTE — Telephone Encounter (Signed)
Swelling and pain of the clitoris worse , see last OV and pt's email. Needs to be seen by gyn  ASAP (today-tomorrow?) --- please arrange

## 2014-01-18 NOTE — Telephone Encounter (Addendum)
Patient has appt today, 01/18/14, with Dr. Gaetano Net. I called the office yesterday and they were unable to get her in any sooner.

## 2014-01-29 ENCOUNTER — Other Ambulatory Visit: Payer: Self-pay | Admitting: General Practice

## 2014-01-29 ENCOUNTER — Telehealth: Payer: Self-pay | Admitting: General Practice

## 2014-01-29 ENCOUNTER — Other Ambulatory Visit: Payer: Self-pay | Admitting: Family Medicine

## 2014-01-29 MED ORDER — ALBUTEROL SULFATE HFA 108 (90 BASE) MCG/ACT IN AERS: 2.0000 | INHALATION_SPRAY | Freq: Four times a day (QID) | RESPIRATORY_TRACT | Status: DC | PRN

## 2014-01-29 NOTE — Telephone Encounter (Signed)
Received a fax from [facility] in regards to pt's proventil inhaler. This is no longer covered, per their request proair hfa was sent to pharmacy.

## 2014-01-30 NOTE — Telephone Encounter (Signed)
Med filled.  

## 2014-02-01 ENCOUNTER — Other Ambulatory Visit: Payer: Self-pay | Admitting: Family Medicine

## 2014-02-02 ENCOUNTER — Encounter: Payer: Self-pay | Admitting: General Practice

## 2014-02-02 NOTE — Telephone Encounter (Signed)
Last ov 10-13 #30 with 3 Med filled 01-12-14  No csc on file

## 2014-02-02 NOTE — Telephone Encounter (Signed)
Pt needs uds and csc. Med filled, pt notified.

## 2014-02-05 ENCOUNTER — Other Ambulatory Visit: Payer: Self-pay | Admitting: Family Medicine

## 2014-02-05 NOTE — Telephone Encounter (Signed)
Last OV 01-12-14 Med filled #30 with 3 on 09/11/13

## 2014-02-19 ENCOUNTER — Ambulatory Visit (INDEPENDENT_AMBULATORY_CARE_PROVIDER_SITE_OTHER): Payer: 59 | Admitting: Family Medicine

## 2014-02-19 ENCOUNTER — Encounter: Payer: Self-pay | Admitting: Family Medicine

## 2014-02-19 VITALS — BP 124/82 | HR 71 | Temp 98.0°F | Resp 16 | Wt 167.1 lb

## 2014-02-19 DIAGNOSIS — J302 Other seasonal allergic rhinitis: Secondary | ICD-10-CM

## 2014-02-19 DIAGNOSIS — J309 Allergic rhinitis, unspecified: Secondary | ICD-10-CM

## 2014-02-19 MED ORDER — MONTELUKAST SODIUM 10 MG PO TABS: 10.0000 mg | ORAL_TABLET | Freq: Every day | ORAL | Status: DC

## 2014-02-19 MED ORDER — FLUTICASONE PROPIONATE 50 MCG/ACT NA SUSP: 2.0000 | Freq: Every day | NASAL | Status: DC

## 2014-02-19 NOTE — Assessment & Plan Note (Signed)
Severe.  Pt is already taking Zyrtec daily.  Add Singulair and Flonase to get pt through the season.  Pt to call if sxs change or worsen.  Will follow.

## 2014-02-19 NOTE — Progress Notes (Signed)
Pre visit review using our clinic review tool, if applicable. No additional management support is needed unless otherwise documented below in the visit note. 

## 2014-02-19 NOTE — Patient Instructions (Signed)
Follow up as needed Continue the Zyrtec daily Add the Singulair daily Add the nasal steroid- 2 sprays each nostril daily Call with any questions or concerns Hang in there!!!

## 2014-02-19 NOTE — Progress Notes (Signed)
   Subjective:    Patient ID: Vicki [name], female    DOB: 08/10/1956, 58 y.o.   MRN: [id number]  HPI Seasonal allergies- chronic problem for pt, on Zyrtec year round.  Last year pt had horrible spring w/ multiple sinus and bronchitis infections requiring steroids.  Having nasal congestion, sneezing.  Not yet having itchy eyes or skin.  No wheezing.   Review of Systems For ROS see HPI     Objective:   Physical Exam  Vitals reviewed. Constitutional: She appears well-developed and well-nourished. No distress.  HENT:  Head: Normocephalic and atraumatic.  Right Ear: Tympanic membrane normal.  Left Ear: Tympanic membrane normal.  Nose: Mucosal edema and rhinorrhea present. Right sinus exhibits no maxillary sinus tenderness and no frontal sinus tenderness. Left sinus exhibits no maxillary sinus tenderness and no frontal sinus tenderness.  Mouth/Throat: Mucous membranes are normal. Posterior oropharyngeal erythema (w/ PND) present.  Eyes: Conjunctivae and EOM are normal. Pupils are equal, round, and reactive to light.  Neck: Normal range of motion. Neck supple.  Cardiovascular: Normal rate, regular rhythm and normal heart sounds.   Pulmonary/Chest: Effort normal and breath sounds normal. No respiratory distress. She has no wheezes. She has no rales.  Lymphadenopathy:    She has no cervical adenopathy.          Assessment & Plan:

## 2014-02-26 ENCOUNTER — Other Ambulatory Visit: Payer: Self-pay | Admitting: Obstetrics and Gynecology

## 2014-03-04 ENCOUNTER — Other Ambulatory Visit: Payer: Self-pay | Admitting: Cardiology

## 2014-03-05 NOTE — Telephone Encounter (Signed)
Rx was sent to pharmacy electronically. 

## 2014-03-18 ENCOUNTER — Other Ambulatory Visit: Payer: Self-pay | Admitting: Cardiovascular Disease

## 2014-03-19 NOTE — Telephone Encounter (Signed)
Rx was sent to pharmacy electronically. 

## 2014-04-29 ENCOUNTER — Other Ambulatory Visit: Payer: Self-pay | Admitting: Cardiovascular Disease

## 2014-04-30 NOTE — Telephone Encounter (Signed)
Rx refill sent to patient pharmacy   

## 2014-05-23 ENCOUNTER — Other Ambulatory Visit: Payer: Self-pay | Admitting: Cardiovascular Disease

## 2014-05-23 NOTE — Telephone Encounter (Signed)
Rx was sent to pharmacy electronically. 

## 2014-06-25 ENCOUNTER — Other Ambulatory Visit: Payer: Self-pay | Admitting: Family Medicine

## 2014-06-26 NOTE — Telephone Encounter (Signed)
Med filled and faxed.  

## 2014-06-26 NOTE — Telephone Encounter (Signed)
Last OV 01-12-14 (rash) 02-05-14 #30 with 3  No csc or uds on file

## 2014-07-24 ENCOUNTER — Other Ambulatory Visit: Payer: Self-pay | Admitting: Family Medicine

## 2014-07-25 NOTE — Telephone Encounter (Signed)
Med filled and faxed.  

## 2014-07-25 NOTE — Telephone Encounter (Signed)
Needs CSC if not already done

## 2014-07-25 NOTE — Telephone Encounter (Signed)
Last OV 3-23 (sinus) 01-22-12 #30 with 3  No CSC or UDS

## 2014-07-30 ENCOUNTER — Other Ambulatory Visit: Payer: Self-pay | Admitting: Family Medicine

## 2014-07-30 ENCOUNTER — Other Ambulatory Visit: Payer: Self-pay | Admitting: Cardiovascular Disease

## 2014-07-31 ENCOUNTER — Other Ambulatory Visit: Payer: Self-pay | Admitting: *Deleted

## 2014-07-31 MED ORDER — METOPROLOL TARTRATE 50 MG PO TABS: ORAL_TABLET | ORAL | Status: DC

## 2014-07-31 NOTE — Telephone Encounter (Signed)
[place] sent in for Metoprolol

## 2014-07-31 NOTE — Telephone Encounter (Signed)
Med filled.  

## 2014-07-31 NOTE — Telephone Encounter (Signed)
Rx was sent to pharmacy electronically. 

## 2014-08-26 ENCOUNTER — Other Ambulatory Visit: Payer: Self-pay | Admitting: Family Medicine

## 2014-08-27 ENCOUNTER — Other Ambulatory Visit: Payer: Self-pay | Admitting: Family Medicine

## 2014-08-27 NOTE — Telephone Encounter (Signed)
Med filled and faxed.  

## 2014-08-27 NOTE — Telephone Encounter (Signed)
Last OV 02-19-14 (rhinitis), CPE scheduled for November Med filled 07-25-14 #30 with 0

## 2014-08-28 NOTE — Telephone Encounter (Signed)
Med filled.  

## 2014-10-02 ENCOUNTER — Telehealth: Payer: Self-pay | Admitting: *Deleted

## 2014-10-02 ENCOUNTER — Ambulatory Visit (INDEPENDENT_AMBULATORY_CARE_PROVIDER_SITE_OTHER): Payer: 59 | Admitting: Family Medicine

## 2014-10-02 ENCOUNTER — Encounter: Payer: Self-pay | Admitting: Family Medicine

## 2014-10-02 ENCOUNTER — Other Ambulatory Visit: Payer: Self-pay

## 2014-10-02 VITALS — BP 130/80 | HR 62 | Temp 98.1°F | Resp 16 | Ht 67.0 in | Wt 171.0 lb

## 2014-10-02 DIAGNOSIS — Z01419 Encounter for gynecological examination (general) (routine) without abnormal findings: Secondary | ICD-10-CM

## 2014-10-02 DIAGNOSIS — Z780 Asymptomatic menopausal state: Secondary | ICD-10-CM

## 2014-10-02 DIAGNOSIS — Z1231 Encounter for screening mammogram for malignant neoplasm of breast: Secondary | ICD-10-CM

## 2014-10-02 LAB — HEPATIC FUNCTION PANEL
ALT: 18 U/L (ref 0–35)
AST: 23 U/L (ref 0–37)
Albumin: 3.5 g/dL (ref 3.5–5.2)
Alkaline Phosphatase: 80 U/L (ref 39–117)
Bilirubin, Direct: 0 mg/dL (ref 0.0–0.3)
Total Bilirubin: 0.6 mg/dL (ref 0.2–1.2)
Total Protein: 6.9 g/dL (ref 6.0–8.3)

## 2014-10-02 LAB — CBC WITH DIFFERENTIAL/PLATELET
Basophils Absolute: 0 10*3/uL (ref 0.0–0.1)
Basophils Relative: 0.5 % (ref 0.0–3.0)
Eosinophils Absolute: 0.1 10*3/uL (ref 0.0–0.7)
Eosinophils Relative: 2.2 % (ref 0.0–5.0)
HCT: 35.7 % — ABNORMAL LOW (ref 36.0–46.0)
Hemoglobin: 11.9 g/dL — ABNORMAL LOW (ref 12.0–15.0)
Lymphocytes Relative: 27.8 % (ref 12.0–46.0)
Lymphs Abs: 1.6 10*3/uL (ref 0.7–4.0)
MCHC: 33.4 g/dL (ref 30.0–36.0)
MCV: 88.6 fl (ref 78.0–100.0)
Monocytes Absolute: 0.3 10*3/uL (ref 0.1–1.0)
Monocytes Relative: 4.9 % (ref 3.0–12.0)
Neutro Abs: 3.6 10*3/uL (ref 1.4–7.7)
Neutrophils Relative %: 64.6 % (ref 43.0–77.0)
Platelets: 242 10*3/uL (ref 150.0–400.0)
RBC: 4.03 Mil/uL (ref 3.87–5.11)
RDW: 14.6 % (ref 11.5–15.5)
WBC: 5.6 10*3/uL (ref 4.0–10.5)

## 2014-10-02 LAB — BASIC METABOLIC PANEL
BUN: 12 mg/dL (ref 6–23)
CO2: 23 mEq/L (ref 19–32)
Calcium: 9.1 mg/dL (ref 8.4–10.5)
Chloride: 106 mEq/L (ref 96–112)
Creatinine, Ser: 0.9 mg/dL (ref 0.4–1.2)
GFR: 71.51 mL/min (ref >60.00)
Glucose, Bld: 80 mg/dL (ref 70–99)
Potassium: 4 mEq/L (ref 3.5–5.1)
Sodium: 139 mEq/L (ref 135–145)

## 2014-10-02 LAB — LIPID PANEL
Cholesterol: 163 mg/dL (ref 0–200)
HDL: 51.4 mg/dL (ref >39.00)
LDL Cholesterol: 85 mg/dL (ref 0–99)
NonHDL: 111.6
Total CHOL/HDL Ratio: 3
Triglycerides: 132 mg/dL (ref 0.0–149.0)
VLDL: 26.4 mg/dL (ref 0.0–40.0)

## 2014-10-02 LAB — TSH: TSH: 2.54 u[IU]/mL (ref 0.35–4.50)

## 2014-10-02 LAB — VITAMIN D 25 HYDROXY (VIT D DEFICIENCY, FRACTURES): VITD: 38.34 ng/mL (ref 30.00–100.00)

## 2014-10-02 NOTE — Patient Instructions (Signed)
Follow up in 6 months to recheck BP and cholesterol We'll notify you of your lab results and make any changes if needed Keep up the good work on healthy diet and regular exercise Call your insurance and ask about the shingles shot Call with any questions or concerns Happy Holidays!!!

## 2014-10-02 NOTE — Progress Notes (Signed)
   Subjective:    Patient ID: Vicki [name], female    DOB: Apr 02, 1956, 58 y.o.   MRN: [id number]  HPI CPE- UTD on mammo.  No need for pap due to TAH.  Has not had colonoscopy due to cards indicating that she cannot stop Plavix.  Pt willing to do Cologuard.   Review of Systems Patient reports no vision/ hearing changes, adenopathy,fever, weight change,  persistant/recurrent hoarseness , swallowing issues, chest pain, palpitations, edema, persistant/recurrent cough, hemoptysis, dyspnea (rest/exertional/paroxysmal nocturnal), gastrointestinal bleeding (melena, rectal bleeding), abdominal pain, significant heartburn, bowel changes, GU symptoms (dysuria, hematuria, incontinence), Gyn symptoms (abnormal  bleeding, pain),  syncope, focal weakness, memory loss, numbness & tingling, skin/hair/nail changes, abnormal bruising or bleeding, anxiety, or depression.     Objective:   Physical Exam General Appearance:    Alert, cooperative, no distress, appears stated age  Head:    Normocephalic, without obvious abnormality, atraumatic  Eyes:    PERRL, conjunctiva/corneas clear, EOM's intact, fundi    benign, both eyes  Ears:    Normal TM's and external ear canals, both ears  Nose:   Nares normal, septum midline, mucosa normal, no drainage    or sinus tenderness  Throat:   Lips, mucosa, and tongue normal; teeth and gums normal  Neck:   Supple, symmetrical, trachea midline, no adenopathy;    Thyroid: no enlargement/tenderness/nodules  Back:     Symmetric, no curvature, ROM normal, no CVA tenderness  Lungs:     Clear to auscultation bilaterally, respirations unlabored  Chest Wall:    No tenderness or deformity   Heart:    Regular rate and rhythm, S1 and S2 normal, no murmur, rub   or gallop  Breast Exam:    Deferred to GYN  Abdomen:     Soft, non-tender, bowel sounds active all four quadrants,    no masses, no organomegaly  Genitalia:    Deferred to GYN  Rectal:    Extremities:   Extremities normal,  atraumatic, no cyanosis or edema  Pulses:   2+ and symmetric all extremities  Skin:   Skin color, texture, turgor normal, no rashes or lesions  Lymph nodes:   Cervical, supraclavicular, and axillary nodes normal  Neurologic:   CNII-XII intact, normal strength, sensation and reflexes    throughout          Assessment & Plan:

## 2014-10-02 NOTE — Telephone Encounter (Signed)
Patient dropped off insurance health solution physician form. Form filled out and forwarded to Dr. Birdie Riddle for signature. JG//CMA

## 2014-10-02 NOTE — Assessment & Plan Note (Signed)
Pt's PE WNL.  UTD on mammo, no need for paps.  Pt interested in cologuard b/c she is unable to have colonoscopy.  UTD on flu.  Pt to ask insurance about shingles and pneumovax.  Check labs.  Anticipatory guidance provided.

## 2014-10-02 NOTE — Progress Notes (Signed)
Pre visit review using our clinic review tool, if applicable. No additional management support is needed unless otherwise documented below in the visit note. 

## 2014-10-08 NOTE — Telephone Encounter (Signed)
Completed form faxed to [place] at [phone]. JG//CMA

## 2014-10-30 ENCOUNTER — Ambulatory Visit
Admission: RE | Admit: 2014-10-30 | Discharge: 2014-10-30 | Disposition: A | Discharge status: Home / Self Care | Payer: 59 | Source: Ambulatory Visit

## 2014-10-30 ENCOUNTER — Other Ambulatory Visit: Payer: Self-pay | Admitting: Cardiovascular Disease

## 2014-10-30 ENCOUNTER — Ambulatory Visit
Admission: RE | Admit: 2014-10-30 | Discharge: 2014-10-30 | Disposition: A | Discharge status: Home / Self Care | Payer: 59 | Source: Ambulatory Visit | Attending: Family Medicine | Admitting: Family Medicine

## 2014-10-30 ENCOUNTER — Other Ambulatory Visit: Payer: Self-pay | Admitting: Family Medicine

## 2014-10-30 DIAGNOSIS — Z1231 Encounter for screening mammogram for malignant neoplasm of breast: Secondary | ICD-10-CM

## 2014-10-30 DIAGNOSIS — Z780 Asymptomatic menopausal state: Secondary | ICD-10-CM

## 2014-10-30 LAB — HM DEXA SCAN

## 2014-10-31 ENCOUNTER — Encounter: Payer: Self-pay | Admitting: General Practice

## 2014-10-31 NOTE — Telephone Encounter (Signed)
Med filled.  

## 2014-11-15 ENCOUNTER — Encounter: Payer: Self-pay | Admitting: General Practice

## 2014-11-27 ENCOUNTER — Encounter: Payer: Self-pay | Admitting: Family Medicine

## 2014-11-29 ENCOUNTER — Other Ambulatory Visit: Payer: Self-pay | Admitting: Family Medicine

## 2014-11-29 ENCOUNTER — Other Ambulatory Visit: Payer: Self-pay | Admitting: Cardiovascular Disease

## 2014-11-29 NOTE — Telephone Encounter (Signed)
Last OV 10/02/14 Alprazolam last filled 08/27/14 #30 with 2

## 2014-11-29 NOTE — Telephone Encounter (Signed)
E sent to pharm; pt needs appt for future refills

## 2014-11-29 NOTE — Telephone Encounter (Signed)
Med filled and faxed.  

## 2015-01-02 ENCOUNTER — Other Ambulatory Visit: Payer: Self-pay | Admitting: Cardiovascular Disease

## 2015-01-07 NOTE — Telephone Encounter (Signed)
Rx(s) sent to pharmacy electronically.  

## 2015-01-07 NOTE — Telephone Encounter (Signed)
Pt says she have been out of her medicine,she needs her generic Plavix and Crestor. Please call to [facility]- [phone] asap.

## 2015-01-08 ENCOUNTER — Telehealth: Payer: Self-pay | Admitting: Cardiovascular Disease

## 2015-01-09 NOTE — Telephone Encounter (Signed)
Closed encounter °

## 2015-01-15 ENCOUNTER — Ambulatory Visit (INDEPENDENT_AMBULATORY_CARE_PROVIDER_SITE_OTHER): Payer: 59 | Admitting: Cardiovascular Disease

## 2015-01-15 ENCOUNTER — Encounter: Payer: Self-pay | Admitting: Cardiovascular Disease

## 2015-01-15 VITALS — Ht 66.0 in | Wt 168.0 lb

## 2015-01-15 DIAGNOSIS — I10 Essential (primary) hypertension: Secondary | ICD-10-CM

## 2015-01-15 DIAGNOSIS — I2510 Atherosclerotic heart disease of native coronary artery without angina pectoris: Secondary | ICD-10-CM

## 2015-01-15 NOTE — Progress Notes (Signed)
01/15/2015 Zollie Scale   04-23-1956  [id number]  Primary Physician Annye Asa, MD Primary Cardiologist: Lorretta Harp MD Renae Gloss   HPI:  The patient is a delightful 59 year old, mildly overweight married Caucasian female mother of 47, whose husband is also a patient of mine. 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 She had an inferior STEMI with witnessed VF arrest, CPR, defibrillation and intervention requiring stenting in the dominant RCA by myself on February 15, 2010. This was complicated by cardiogenic shock with placement of an intraaortic balloon pump. She was an [facility] patient. 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 She has stopped smoking since that time, and her other problems include hyperlipidemia, well controlled on statin therapy. She was a smoker with a 35-pack-year history of tobacco abuse up until that time. She denies chest pain or shortness of breath. She is scheduled for surgical removal of what appears to be a sebaceous cyst in the operating room tomorrow by Dr. Armandina Gemma under general anesthesia. She has not stopped her aspirin and Plavix at my request. She developed fatigue symptoms 2 summers ago  and saw Cecilie Kicks registered nurse practitioner who performed a Myoview stress test that was normal. She decreased her beta blocker dosage resulting in marked improvement in her symptoms. Since I saw her one year ago she is completely asymptomatic.   Current Outpatient Prescriptions  Medication Sig Dispense Refill  . albuterol (PROAIR HFA) 108 (90 BASE) MCG/ACT inhaler Inhale 2 puffs into the lungs every 6 (six) hours as needed for wheezing or shortness of breath. 6.7 g 3  . ALPRAZolam (XANAX) 0.25 MG tablet TAKE 1 TABLET BY MOUTH EVERY NIGHT AT BEDTIME AS NEEDED 30 tablet 0  . aspirin 81 MG tablet Take 81 mg by mouth 3 (three) times daily.     . cetirizine (ZYRTEC) 5 MG tablet Take 5 mg by mouth daily.      . clopidogrel  (PLAVIX) 75 MG tablet Take 1 tablet (75 mg total) by mouth daily. <please make appointment with Dr. Gwenlyn Found for refills> 30 tablet 1  . fluticasone (FLONASE) 50 MCG/ACT nasal spray Place 2 sprays into both nostrils daily. 16 g 6  . lisinopril (PRINIVIL,ZESTRIL) 10 MG tablet TAKE 1 TABLET BY MOUTH DAILY 30 tablet 3  . magnesium gluconate (MAGONATE) 500 MG tablet Take 500 mg by mouth daily.    . metoprolol (LOPRESSOR) 50 MG tablet TAKE 1 TABLET BY MOUTH TWICE DAILY 180 tablet 3  . montelukast (SINGULAIR) 10 MG tablet Take 1 tablet (10 mg total) by mouth at bedtime. 30 tablet 3  . nitroGLYCERIN (NITROSTAT) 0.4 MG SL tablet Place 1 tablet (0.4 mg total) under the tongue every 5 (five) minutes as needed for chest pain. 50 tablet 4  . pantoprazole (PROTONIX) 40 MG tablet Take 1 tablet (40 mg total) by mouth 2 (two) times daily. Pt needs appointment or there will be no future refills 60 tablet 0  . potassium chloride SA (K-DUR,KLOR-CON) 20 MEQ tablet TAKE 1 TABLET BY MOUTH EVERY DAY 30 tablet 3  . rosuvastatin (CRESTOR) 10 MG tablet Take 1 tablet (10 mg total) by mouth daily. <please make appointment with Dr. Gwenlyn Found for refills> 30 tablet 1   No current facility-administered medications for this visit.    No Known Allergies  History   Social History  . Marital Status: Married    Spouse Name: N/A  . Number of Children: 2  .  Years of Education: N/A   Occupational History  . 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 Smoking status: Former Smoker    Quit date: 01/28/2010  . Smokeless tobacco: Never Used  . Alcohol Use: 0.0 oz/week    1-2 Glasses of wine per week  . Drug Use: No  . Sexual Activity: Not on file   Other Topics Concern  . Not on file   Social History Narrative     Review of Systems: General: negative for chills, fever, night sweats or weight changes.  Cardiovascular: negative for chest pain, dyspnea on exertion, edema, orthopnea, palpitations,  paroxysmal nocturnal dyspnea or shortness of breath Dermatological: negative for rash Respiratory: negative for cough or wheezing Urologic: negative for hematuria Abdominal: negative for nausea, vomiting, diarrhea, bright red blood per rectum, melena, or hematemesis Neurologic: negative for visual changes, syncope, or dizziness All other systems reviewed and are otherwise negative except as noted above.    Height 5\' 6"  (1.676 m), weight 168 lb (76.204 kg).  General appearance: alert and no distress Neck: no adenopathy, no carotid bruit, no JVD, supple, symmetrical, trachea midline and thyroid not enlarged, symmetric, no tenderness/mass/nodules Lungs: clear to auscultation bilaterally Heart: regular rate and rhythm, S1, S2 normal, no murmur, click, rub or gallop Extremities: extremities normal, atraumatic, no cyanosis or edema  EKG normal sinus rhythm at 69 without ST or T-wave changes. I personally reviewed this EKG  ASSESSMENT AND PLAN:   Essential hypertension History of hypertension with blood pressure is 130/80. She is on low-dose beta blocker as well as lisinopril. Continue current meds at current dosing   HYPERLIPIDEMIA History of hyperlipidemia on Crestor 10 mg daily with a recent lipid profile performed 10/02/14 revealed a total cholesterol of 163, LDL of 85 and HDL of 51   CAD (coronary atherosclerotic disease), STEMI 2011 with emergent BMS to RCA, residual 60 % LAD disease.  Associated V fib arrest. History of CAD status post sudden cardiac death with witnessed ventricular fibrillation, CPR, defibrillation and intervention requiring stenting of her dominant RCA by myself 02/15/10 with a bare metal stent. She had cardiac shock with intra-aortic balloon pump. 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 Ultimately, her LV function was normal and she may have made a full recovery. She denies chest pain or shortness of breath. Her last Myoview performed 07/20/13  was nonischemic.       Lorretta Harp MD FACP,FACC,FAHA, [facility] 01/15/2015 3:34 PM

## 2015-01-15 NOTE — Assessment & Plan Note (Signed)
History of hypertension with blood pressure is 130/80. She is on low-dose beta blocker as well as lisinopril. Continue current meds at current dosing

## 2015-01-15 NOTE — Assessment & Plan Note (Signed)
History of hyperlipidemia on Crestor 10 mg daily with a recent lipid profile performed 10/02/14 revealed a total cholesterol of 163, LDL of 85 and HDL of 51

## 2015-01-15 NOTE — Patient Instructions (Signed)
Your physician wants you to follow-up in 1 year with Dr. Berry. You will receive a reminder letter in the mail 2 months in advance. If you do not receive a letter, please call our office to schedule the follow-up appointment.  

## 2015-01-15 NOTE — Assessment & Plan Note (Signed)
History of CAD status post sudden cardiac death with witnessed ventricular fibrillation, CPR, defibrillation and intervention requiring stenting of her dominant RCA by myself 02/15/10 with a bare metal stent. She had cardiac shock with intra-aortic balloon pump. 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 Ultimately, her LV function was normal and she may have made a full recovery. She denies chest pain or shortness of breath. Her last Myoview performed 07/20/13 was nonischemic.

## 2015-01-20 ENCOUNTER — Other Ambulatory Visit: Payer: Self-pay | Admitting: Family Medicine

## 2015-01-21 NOTE — Telephone Encounter (Signed)
Last OV 10-02-14 Alprazolam last filled 11-29-14 #30 with 0

## 2015-01-21 NOTE — Telephone Encounter (Signed)
Med filled.  

## 2015-01-26 ENCOUNTER — Other Ambulatory Visit: Payer: Self-pay | Admitting: Cardiovascular Disease

## 2015-01-28 NOTE — Telephone Encounter (Signed)
Rx(s) sent to pharmacy electronically.  

## 2015-02-12 ENCOUNTER — Encounter: Payer: Self-pay | Admitting: Family Medicine

## 2015-02-12 ENCOUNTER — Ambulatory Visit (INDEPENDENT_AMBULATORY_CARE_PROVIDER_SITE_OTHER): Payer: 59 | Admitting: Family Medicine

## 2015-02-12 VITALS — BP 122/80 | HR 60 | Temp 97.9°F | Resp 16 | Wt 168.5 lb

## 2015-02-12 DIAGNOSIS — M533 Sacrococcygeal disorders, not elsewhere classified: Secondary | ICD-10-CM

## 2015-02-12 DIAGNOSIS — L409 Psoriasis, unspecified: Secondary | ICD-10-CM

## 2015-02-12 DIAGNOSIS — J302 Other seasonal allergic rhinitis: Secondary | ICD-10-CM

## 2015-02-12 DIAGNOSIS — M5489 Other dorsalgia: Secondary | ICD-10-CM

## 2015-02-12 DIAGNOSIS — R1032 Left lower quadrant pain: Secondary | ICD-10-CM

## 2015-02-12 LAB — POCT URINALYSIS DIPSTICK
Bilirubin, UA: NEGATIVE
Blood, UA: NEGATIVE
Glucose, UA: NEGATIVE
Ketones, UA: NEGATIVE
Leukocytes, UA: NEGATIVE
Nitrite, UA: NEGATIVE
Protein, UA: NEGATIVE
Spec Grav, UA: 1.02
Urobilinogen, UA: 0.2
pH, UA: 6

## 2015-02-12 LAB — BASIC METABOLIC PANEL
BUN: 13 mg/dL (ref 6–23)
CO2: 31 mEq/L (ref 19–32)
Calcium: 9.7 mg/dL (ref 8.4–10.5)
Chloride: 106 mEq/L (ref 96–112)
Creatinine, Ser: 0.8 mg/dL (ref 0.40–1.20)
GFR: 77.64 mL/min (ref >60.00)
Glucose, Bld: 96 mg/dL (ref 70–99)
Potassium: 4.6 mEq/L (ref 3.5–5.1)
Sodium: 139 mEq/L (ref 135–145)

## 2015-02-12 LAB — HEPATIC FUNCTION PANEL
ALT: 15 U/L (ref 0–35)
AST: 21 U/L (ref 0–37)
Albumin: 4.4 g/dL (ref 3.5–5.2)
Alkaline Phosphatase: 91 U/L (ref 39–117)
Bilirubin, Direct: 0.1 mg/dL (ref 0.0–0.3)
Total Bilirubin: 0.5 mg/dL (ref 0.2–1.2)
Total Protein: 7.2 g/dL (ref 6.0–8.3)

## 2015-02-12 LAB — CBC WITH DIFFERENTIAL/PLATELET
Basophils Absolute: 0 10*3/uL (ref 0.0–0.1)
Basophils Relative: 0.3 % (ref 0.0–3.0)
Eosinophils Absolute: 0.1 10*3/uL (ref 0.0–0.7)
Eosinophils Relative: 1.1 % (ref 0.0–5.0)
HCT: 36 % (ref 36.0–46.0)
Hemoglobin: 12.2 g/dL (ref 12.0–15.0)
Lymphocytes Relative: 28.4 % (ref 12.0–46.0)
Lymphs Abs: 1.9 10*3/uL (ref 0.7–4.0)
MCHC: 34 g/dL (ref 30.0–36.0)
MCV: 87.8 fl (ref 78.0–100.0)
Monocytes Absolute: 0.4 10*3/uL (ref 0.1–1.0)
Monocytes Relative: 6.4 % (ref 3.0–12.0)
Neutro Abs: 4.4 10*3/uL (ref 1.4–7.7)
Neutrophils Relative %: 63.8 % (ref 43.0–77.0)
Platelets: 285 10*3/uL (ref 150.0–400.0)
RBC: 4.11 Mil/uL (ref 3.87–5.11)
RDW: 14.3 % (ref 11.5–15.5)
WBC: 6.8 10*3/uL (ref 4.0–10.5)

## 2015-02-12 MED ORDER — PREDNISONE 10 MG PO TABS: ORAL_TABLET | ORAL | Status: DC

## 2015-02-12 MED ORDER — TRIAMCINOLONE ACETONIDE 0.1 % EX OINT: 1.0000 "application " | TOPICAL_OINTMENT | Freq: Two times a day (BID) | CUTANEOUS | Status: DC

## 2015-02-12 MED ORDER — CYCLOBENZAPRINE HCL 10 MG PO TABS: 10.0000 mg | ORAL_TABLET | Freq: Three times a day (TID) | ORAL | Status: DC | PRN

## 2015-02-12 NOTE — Assessment & Plan Note (Signed)
New.  Pt's pain is localized to L SI joint.  No obvious spasm, negative SLR bilaterally.  Start prednisone taper to improve pain and inflammation.  Flexeril prn.  Heat.  Reviewed supportive care and red flags that should prompt return.  Pt expressed understanding and is in agreement w/ plan.

## 2015-02-12 NOTE — Assessment & Plan Note (Signed)
New to provider.  Pt w/ hx of similar.  Was previously well controlled w/ steroid ointment but she has since run out of this.  Start topical triamcinolone ointment.  Will follow.

## 2015-02-12 NOTE — Patient Instructions (Signed)
Follow up if no improvement We'll notify you of your lab results Start the Prednisone as directed- take w/ food Use the flexeril as needed for muscle spasm HEAT the back! Continue the Zyrtec and Singulair daily Drink plenty of fluids REST! Use the triamcinolone ointment on the toe twice daily Call with any questions or concerns Hang in there!!!

## 2015-02-12 NOTE — Assessment & Plan Note (Signed)
New.  Suspect this is referred pain from pt's L SI joint but will check labs to r/o infxn, electrolyte abnormality.  Pt's exam not consistent w/ diverticulitis.  Pt to monitor sxs and return if not improving or worsening.  Pt expressed understanding and is in agreement w/ plan.

## 2015-02-12 NOTE — Progress Notes (Signed)
   Subjective:    Patient ID: Vicki [name], female    DOB: January 23, 1956, 59 y.o.   MRN: [id number]  HPI 'not feeling good'- pt reports feeling poorly x1 week.  'there's nothing in particular that's wrong, I just don't feel good'.  Yesterday woke up feeling dizzy and nausea.  Had swelling under eyes bilaterally.  Pt has not slept well in over a week due to L lower back pain.  Pain will radiate through to LLQ.  Dizziness improved as day went on- reports she took husband's Tramadol the night before.  Dull HA today.  No fever.  No changes to bowel habits.  + sinus pressure.  Taking Zyrtec year round, restarted singulair.  Back pain has been for 'a couple of weeks'.  Frequently does heavy lifting.  Pain doesn't radiate into butt or leg.  Psoriasis flare- R 2nd toe.  Was using a steroid ointment w/ good relief until recently when she ran out and sxs flared.  Review of Systems For ROS see HPI     Objective:   Physical Exam  Constitutional: She is oriented to person, place, and time. She appears well-developed and well-nourished. No distress.  HENT:  Head: Normocephalic and atraumatic.  Right Ear: Tympanic membrane normal.  Left Ear: Tympanic membrane normal.  Nose: Mucosal edema and rhinorrhea present. Right sinus exhibits no maxillary sinus tenderness and no frontal sinus tenderness. Left sinus exhibits no maxillary sinus tenderness and no frontal sinus tenderness.  Mouth/Throat: Mucous membranes are normal. Posterior oropharyngeal erythema (w/ PND) present.  Eyes: Conjunctivae and EOM are normal. Pupils are equal, round, and reactive to light.  Neck: Normal range of motion. Neck supple. No thyromegaly present.  Cardiovascular: Normal rate, regular rhythm, normal heart sounds and intact distal pulses.   No murmur heard. Pulmonary/Chest: Effort normal and breath sounds normal. No respiratory distress. She has no wheezes. She has no rales.  Abdominal: Soft. She exhibits no distension. There is  tenderness (mild LLQ TTP).  Musculoskeletal: She exhibits no edema.  + TTP over L SI joint Pain improves w/ forward flexion, worse w/ extension  Lymphadenopathy:    She has no cervical adenopathy.  Neurological: She is alert and oriented to person, place, and time. She has normal reflexes. No cranial nerve deficit. Coordination normal.  (-) SLR bilaterally  Skin: Skin is warm and dry.  Thick, scaling area encompassing distal surface of R 2nd toe  Psychiatric: She has a normal mood and affect. Her behavior is normal.  Vitals reviewed.         Assessment & Plan:

## 2015-02-12 NOTE — Progress Notes (Signed)
Pre visit review using our clinic review tool, if applicable. No additional management support is needed unless otherwise documented below in the visit note. 

## 2015-02-12 NOTE — Assessment & Plan Note (Signed)
Deteriorated w/ recent spring bloom.  Pt on daily Zyrtec and restarted singulair.  No evidence of sinus infxn but she does have inflammation present.  The prednisone taper for her back should also improve her allergy sxs.  Suspect her dizziness was a combo of allergies and also using her husband's tramadol in setting of low blood sugar.  Reviewed supportive care and red flags that should prompt return.  Pt expressed understanding and is in agreement w/ plan.

## 2015-02-13 LAB — HM MAMMOGRAPHY: HM Mammogram: NORMAL (ref 0–4)

## 2015-03-02 ENCOUNTER — Other Ambulatory Visit: Payer: Self-pay | Admitting: Cardiovascular Disease

## 2015-03-02 ENCOUNTER — Other Ambulatory Visit: Payer: Self-pay | Admitting: Family Medicine

## 2015-03-04 ENCOUNTER — Telehealth: Payer: Self-pay | Admitting: Cardiovascular Disease

## 2015-03-04 ENCOUNTER — Telehealth: Payer: Self-pay | Admitting: Family Medicine

## 2015-03-04 MED ORDER — ROSUVASTATIN CALCIUM 10 MG PO TABS: 10.0000 mg | ORAL_TABLET | Freq: Every day | ORAL | Status: DC

## 2015-03-04 MED ORDER — POTASSIUM CHLORIDE CRYS ER 20 MEQ PO TBCR: 20.0000 meq | EXTENDED_RELEASE_TABLET | Freq: Every day | ORAL | Status: DC

## 2015-03-04 MED ORDER — CLOPIDOGREL BISULFATE 75 MG PO TABS: 75.0000 mg | ORAL_TABLET | Freq: Every day | ORAL | Status: DC

## 2015-03-04 NOTE — Telephone Encounter (Signed)
°  1. Which medications need to be refilled? Crestor,Generic Plavix and Potassium-Please call this in today if possible.  2. Which pharmacy is medication to be sent to?[facility]-[phone]  3. Do they need a 30 day or 90 day supply? 90 and refills  4. Would they like a call back once the medication has been sent to the pharmacy? no

## 2015-03-04 NOTE — Telephone Encounter (Signed)
Ok to schedule pt?

## 2015-03-04 NOTE — Telephone Encounter (Signed)
Rx(s) sent to pharmacy electronically.  

## 2015-03-04 NOTE — Telephone Encounter (Signed)
Hit wrong provider by [facility] provider is Gwenlyn Found

## 2015-03-04 NOTE — Telephone Encounter (Signed)
°  Relation to pt:  Self  Call back number: [phone] Pharmacy:  Reason for call:  Pt requesting an acute appointment for her allergies pt does not want to wait until next week, can I schedule pt in the same day appointment for Wedesday at 1pm or 2:4p. Pt states MD treats her every year and she is concern because it turns into phenomena. Please Sharyne Peach

## 2015-03-04 NOTE — Telephone Encounter (Signed)
LM advising pt to call office to schedule appointment.

## 2015-03-04 NOTE — Telephone Encounter (Signed)
Med filled.  

## 2015-03-06 ENCOUNTER — Ambulatory Visit (INDEPENDENT_AMBULATORY_CARE_PROVIDER_SITE_OTHER): Payer: 59 | Admitting: Family Medicine

## 2015-03-06 ENCOUNTER — Encounter: Payer: Self-pay | Admitting: Family Medicine

## 2015-03-06 VITALS — BP 122/78 | HR 64 | Temp 98.1°F | Resp 16 | Wt 170.4 lb

## 2015-03-06 DIAGNOSIS — J302 Other seasonal allergic rhinitis: Secondary | ICD-10-CM | POA: Diagnosis not present

## 2015-03-06 DIAGNOSIS — M533 Sacrococcygeal disorders, not elsewhere classified: Secondary | ICD-10-CM

## 2015-03-06 DIAGNOSIS — R195 Other fecal abnormalities: Secondary | ICD-10-CM | POA: Diagnosis not present

## 2015-03-06 MED ORDER — MONTELUKAST SODIUM 10 MG PO TABS: 10.0000 mg | ORAL_TABLET | Freq: Every day | ORAL | Status: DC

## 2015-03-06 MED ORDER — AZELASTINE HCL 0.15 % NA SOLN: 2.0000 | Freq: Two times a day (BID) | NASAL | Status: DC

## 2015-03-06 MED ORDER — BECLOMETHASONE DIPROPIONATE 40 MCG/ACT IN AERS: 1.0000 | INHALATION_SPRAY | Freq: Two times a day (BID) | RESPIRATORY_TRACT | Status: DC

## 2015-03-06 MED ORDER — FLUTICASONE PROPIONATE 50 MCG/ACT NA SUSP: 2.0000 | Freq: Every day | NASAL | Status: DC

## 2015-03-06 NOTE — Progress Notes (Signed)
   Subjective:    Patient ID: Vicki [name], female    DOB: [birth year], 59 y.o.   MRN: [id number]  HPI Seasonal allergies- chronic problem, pt frequently ends up sick this time of year and 'i don't want to go there'.  Pt reports she is unable to breathe, increased SOB, coughing, sneezing.  Currently taking Zyrtec qAM, Singulair qPM, Flonase.  Denies sinus pain/pressure.  No fevers.  LLBP- pain improved w/ prednisone but then returned.  Now pain has returned and is bilaterally.  Will radiate across back but not up or down.  No weakness or numbness.  Difficulty rolling over at night due to pain.  Watery stools- pt reports sxs improved temporarily while on the Prednisone.   Review of Systems For ROS see HPI     Objective:   Physical Exam  Constitutional: She is oriented to person, place, and time. She appears well-developed and well-nourished. No distress.  HENT:  Head: Normocephalic and atraumatic.  Right Ear: Tympanic membrane normal.  Left Ear: Tympanic membrane normal.  Nose: Mucosal edema and rhinorrhea present. Right sinus exhibits no maxillary sinus tenderness and no frontal sinus tenderness. Left sinus exhibits no maxillary sinus tenderness and no frontal sinus tenderness.  Mouth/Throat: Mucous membranes are normal. Posterior oropharyngeal erythema (w/ PND) present.  Eyes: Conjunctivae and EOM are normal. Pupils are equal, round, and reactive to light.  Neck: Normal range of motion. Neck supple.  Cardiovascular: Normal rate, regular rhythm and normal heart sounds.   Pulmonary/Chest: Effort normal and breath sounds normal. No respiratory distress. She has no wheezes. She has no rales.  Abdominal: Soft. Bowel sounds are normal. She exhibits no distension. There is no tenderness. There is no rebound and no guarding.  Lymphadenopathy:    She has no cervical adenopathy.  Neurological: She is alert and oriented to person, place, and time. She has normal reflexes. No cranial nerve deficit.  Coordination normal.  Skin: Skin is warm and dry.  Psychiatric: She has a normal mood and affect. Her behavior is normal. Thought content normal.  Vitals reviewed.         Assessment & Plan:

## 2015-03-06 NOTE — Patient Instructions (Signed)
Follow up as needed Continue the Flonase and Zyrtec Move the Singulair to earlier in the day to help w/ the afternoon allergies Add the Asteline nasal spray- 2 sprays twice daily Add the Qvar inhaler- 1 puff twice daily Drink plenty of fluids We'll call you with your ortho appt for the pain We'll call you with your GI appt to discuss the improvement in the stools on prednisone Call with any questions or concerns Hang in there!

## 2015-03-06 NOTE — Progress Notes (Signed)
Pre visit review using our clinic review tool, if applicable. No additional management support is needed unless otherwise documented below in the visit note. 

## 2015-03-10 NOTE — Assessment & Plan Note (Signed)
Deteriorated.  Pt struggles every spring and typically ends up w/ bacterial infxn.  Based on this, will add daily Asteline nasal spray and Qvar to improve airway inflammation.  Continue Zyrtec, Singulair and Flonase.  Pt expressed understanding and is in agreement w/ plan.

## 2015-03-10 NOTE — Assessment & Plan Note (Signed)
Ongoing issue for pt.  Improved temporarily while on Prednisone but sxs returned after taper completed.  Based on this, will refer to ortho for complete evaluation and tx.  Pt expressed understanding and is in agreement w/ plan.

## 2015-03-10 NOTE — Assessment & Plan Note (Signed)
Ongoing issue for pt.  Improvement while on prednisone may suggest IBD.  Refer to GI for complete evaluation and tx.  Pt expressed understanding and is in agreement w/ plan.

## 2015-04-06 ENCOUNTER — Other Ambulatory Visit: Payer: Self-pay | Admitting: Family Medicine

## 2015-04-08 NOTE — Telephone Encounter (Signed)
Med filled.  

## 2015-05-05 ENCOUNTER — Other Ambulatory Visit: Payer: Self-pay | Admitting: Family Medicine

## 2015-05-06 NOTE — Telephone Encounter (Signed)
Last OV 03-06-15 Alprazolam last filled 01-21-15 #30 with 0

## 2015-05-17 ENCOUNTER — Telehealth: Payer: Self-pay | Admitting: Cardiovascular Disease

## 2015-05-17 NOTE — Telephone Encounter (Signed)
She wants you to call her on Monday,she coming in Tuesday for INR.

## 2015-05-20 NOTE — Telephone Encounter (Signed)
I spoke with Vicki [name] and explained that an INR is not indicated since she is taking Plavix. Her dentist suggested that she have an INR before an upcoming tooth extraction.   I called the dentist office, Dr Wallace Going, and spoke with August.  They were unsure of the blood thinner that [name] was on, which is why they mentioned an INR.  They will proceed with the tooth extraction while [name] continues her Plavix.   I will defer to Dr Gwenlyn Found about holding Plavix or the ASA, which she takes 81mg  three times daily.   Appt for INR cancelled.

## 2015-05-21 ENCOUNTER — Ambulatory Visit: Payer: 59 | Admitting: Pharmacist Clinician (PhC)/ Clinical Pharmacy Specialist

## 2015-05-21 NOTE — Telephone Encounter (Signed)
Needs to have her teeth pulled on aspirin and Plavix. She should only be on aspirin 81 mg once a day

## 2015-05-22 NOTE — Telephone Encounter (Signed)
Patient notified

## 2015-06-01 ENCOUNTER — Other Ambulatory Visit: Payer: Self-pay | Admitting: Cardiovascular Disease

## 2015-06-04 NOTE — Telephone Encounter (Signed)
Rx(s) sent to pharmacy electronically.  

## 2015-06-10 ENCOUNTER — Other Ambulatory Visit: Payer: Self-pay | Admitting: Family Medicine

## 2015-06-10 NOTE — Telephone Encounter (Signed)
Last OV 03-06-15 Alprazolam last filled 05-06-15 #30 with 0  NO csc or UDS

## 2015-06-10 NOTE — Telephone Encounter (Signed)
Med filled and faxed.  

## 2015-07-03 ENCOUNTER — Telehealth: Payer: Self-pay | Admitting: Cardiovascular Disease

## 2015-07-03 NOTE — Telephone Encounter (Signed)
Called patient letting her know potassium was send in for a year, and she should be good on refills.

## 2015-07-03 NOTE — Telephone Encounter (Signed)
°  1. Which medications need to be refilled? Klor Con   2. Which pharmacy is medication to be sent to?[facility] on [address]  3. Do they need a 30 day or 90 day supply? 90  4. Would they like a call back once the medication has been sent to the pharmacy? Yes

## 2015-08-15 ENCOUNTER — Other Ambulatory Visit: Payer: Self-pay | Admitting: Family Medicine

## 2015-08-15 DIAGNOSIS — R197 Diarrhea, unspecified: Secondary | ICD-10-CM

## 2015-08-15 DIAGNOSIS — R1011 Right upper quadrant pain: Secondary | ICD-10-CM

## 2015-08-19 ENCOUNTER — Ambulatory Visit
Admission: RE | Admit: 2015-08-19 | Discharge: 2015-08-19 | Disposition: A | Discharge status: Home / Self Care | Payer: 59 | Source: Ambulatory Visit | Attending: Family Medicine | Admitting: Family Medicine

## 2015-08-19 DIAGNOSIS — R1011 Right upper quadrant pain: Secondary | ICD-10-CM

## 2015-08-19 DIAGNOSIS — R197 Diarrhea, unspecified: Secondary | ICD-10-CM

## 2015-10-07 ENCOUNTER — Other Ambulatory Visit: Payer: Self-pay | Admitting: Family Medicine

## 2015-10-07 DIAGNOSIS — N6320 Unspecified lump in the left breast, unspecified quadrant: Secondary | ICD-10-CM

## 2015-10-11 ENCOUNTER — Ambulatory Visit
Admission: RE | Admit: 2015-10-11 | Discharge: 2015-10-11 | Disposition: A | Discharge status: Home / Self Care | Payer: 59 | Source: Ambulatory Visit | Attending: Family Medicine | Admitting: Family Medicine

## 2015-10-11 DIAGNOSIS — N6320 Unspecified lump in the left breast, unspecified quadrant: Secondary | ICD-10-CM

## 2015-11-30 ENCOUNTER — Other Ambulatory Visit: Payer: Self-pay | Admitting: Family Medicine

## 2015-12-03 NOTE — Telephone Encounter (Signed)
No refills without a BP follow up with Tabori.

## 2015-12-05 ENCOUNTER — Telehealth: Payer: Self-pay | Admitting: Cardiovascular Disease

## 2015-12-05 NOTE — Telephone Encounter (Signed)
Vicki [name] is calling because she has develop a severe Bronchial infection over the last two weeks and her heart rate has been so low and wants to know should she continue to take her BP medication or should she back off of for a week while all this is going on . Please call

## 2015-12-05 NOTE — Telephone Encounter (Signed)
Pt is not symptomatic she has just been told ruing visit to urgent care that she had a low heart rate.  She was unsure of what the heart rate was but she stated it is always low.  Tld her to continue taking mediations as ordered unless she becomes symptomatic.  Pt stated she also got a steroid shot to day for her Bronchitis.  Pt verbalized understanding no additional questions at this time.

## 2015-12-20 ENCOUNTER — Other Ambulatory Visit: Payer: Self-pay | Admitting: Cardiovascular Disease

## 2016-01-17 ENCOUNTER — Encounter: Payer: Self-pay | Admitting: Cardiovascular Disease

## 2016-01-17 ENCOUNTER — Ambulatory Visit (INDEPENDENT_AMBULATORY_CARE_PROVIDER_SITE_OTHER): Payer: 59 | Admitting: Cardiovascular Disease

## 2016-01-17 VITALS — BP 126/72 | HR 51 | Ht 67.0 in | Wt 167.0 lb

## 2016-01-17 DIAGNOSIS — I252 Old myocardial infarction: Secondary | ICD-10-CM

## 2016-01-17 DIAGNOSIS — I10 Essential (primary) hypertension: Secondary | ICD-10-CM

## 2016-01-17 NOTE — Assessment & Plan Note (Signed)
History of CAD status post inferior STEMI with witnessed sudden cardiac death, CPR, defibrillation and intervention of her RCA 02/15/10. This is coupled K by cardiogenic shock with placement of an intra-aortic balloon pumps. She underwent Artic Sun cooling ultimately woke up without any neurologic residual. LV function normalized. She's had no recurrent symptoms.

## 2016-01-17 NOTE — Assessment & Plan Note (Signed)
History of hypertension blood pressure measured today at 126/72. She is on lisinopril and metoprolol. Continue current meds at current dosing

## 2016-01-17 NOTE — Patient Instructions (Signed)

## 2016-01-17 NOTE — Progress Notes (Signed)
01/17/2016 Vicki [name]   02-05-1956  [id number]  Primary Physician Annye Asa, MD Primary Cardiologist: Lorretta Harp MD Renae Gloss   HPI:  The patient is a delightful 60 year old, mildly overweight married Caucasian female mother of 67, whose husband is also a patient of mine. I last saw her 01/15/15. 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 She had an inferior STEMI with witnessed VF arrest, CPR, defibrillation and intervention requiring stenting in the dominant RCA by myself on February 15, 2010. This was complicated by cardiogenic shock with placement of an intraaortic balloon pump. She was an [facility] patient. 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 She has stopped smoking since that time, and her other problems include hyperlipidemia, well controlled on statin therapy. She was a smoker with a 35-pack-year history of tobacco abuse up until that time. She denies chest pain or shortness of breath. She is scheduled for surgical removal of what appears to be a sebaceous cyst in the operating room tomorrow by Dr. Armandina Gemma under general anesthesia. She has not stopped her aspirin and Plavix at my request. She developed fatigue symptoms 2 summers ago and saw Cecilie Kicks registered nurse practitioner who performed a Myoview stress test that was normal. She decreased her beta blocker dosage resulting in marked improvement in her symptoms. Since I saw her one year ago she is completely asymptomatic.   Current Outpatient Prescriptions  Medication Sig Dispense Refill  . ALPRAZolam (XANAX) 0.25 MG tablet TAKE 1 TABLET BY MOUTH EVERY NIGHT AT BEDTIME AS NEEDED 30 tablet 3  . aspirin 81 MG tablet Take 81 mg by mouth daily.     . Azelastine HCl 0.15 % SOLN Place 2 sprays into the nose 2 (two) times daily. 30 mL 6  . beclomethasone (QVAR) 40 MCG/ACT inhaler Inhale 1 puff into the lungs 2 (two) times daily. 1 Inhaler 12  . cetirizine (ZYRTEC) 5 MG tablet Take 5 mg by mouth daily.        . clopidogrel (PLAVIX) 75 MG tablet Take 1 tablet (75 mg total) by mouth daily. 90 tablet 3  . cyclobenzaprine (FLEXERIL) 10 MG tablet Take 1 tablet (10 mg total) by mouth 3 (three) times daily as needed for muscle spasms. 30 tablet 0  . fluticasone (FLONASE) 50 MCG/ACT nasal spray Place 2 sprays into both nostrils daily. 16 g 6  . lisinopril (PRINIVIL,ZESTRIL) 10 MG tablet TAKE 1 TABLET BY MOUTH EVERY DAY 30 tablet 0  . magnesium gluconate (MAGONATE) 500 MG tablet Take 500 mg by mouth daily.    . metoprolol (LOPRESSOR) 50 MG tablet TAKE 1 TABLET BY MOUTH TWICE DAILY 180 tablet 3  . metoprolol (LOPRESSOR) 50 MG tablet TAKE 1 TABLET BY MOUTH TWICE DAILY 60 tablet 10  . montelukast (SINGULAIR) 10 MG tablet Take 1 tablet (10 mg total) by mouth at bedtime. 30 tablet 6  . nitroGLYCERIN (NITROSTAT) 0.4 MG SL tablet Place 1 tablet (0.4 mg total) under the tongue every 5 (five) minutes as needed for chest pain. 50 tablet 4  . pantoprazole (PROTONIX) 40 MG tablet Take 1 tablet (40 mg total) by mouth 2 (two) times daily. 60 tablet 11  . potassium chloride SA (K-DUR,KLOR-CON) 20 MEQ tablet Take 1 tablet (20 mEq total) by mouth daily. 90 tablet 3  . predniSONE (DELTASONE) 10 MG tablet 3 tabs x3 days and then 2 tabs x3 days and then 1 tab x3 days.  Take w/ food. 18 tablet 0  .  rosuvastatin (CRESTOR) 10 MG tablet TAKE 1 TABLET(10 MG) BY MOUTH DAILY 30 tablet 1  . triamcinolone ointment (KENALOG) 0.1 % Apply 1 application topically 2 (two) times daily. 90 g 1   No current facility-administered medications for this visit.    No Known Allergies  Social History   Social History  . Marital Status: Married    Spouse Name: N/A  . Number of Children: 2  . Years of Education: N/A   Occupational History  . Glass blower/designer at [employer]  . Smoking status: Former Smoker    Quit date: 01/28/2010  . Smokeless tobacco: Never Used  . Alcohol Use: 0.0 oz/week    1-2 Glasses  of wine per week  . Drug Use: No  . Sexual Activity: Not on file   Other Topics Concern  . Not on file   Social History Narrative     Review of Systems: General: negative for chills, fever, night sweats or weight changes.  Cardiovascular: negative for chest pain, dyspnea on exertion, edema, orthopnea, palpitations, paroxysmal nocturnal dyspnea or shortness of breath Dermatological: negative for rash Respiratory: negative for cough or wheezing Urologic: negative for hematuria Abdominal: negative for nausea, vomiting, diarrhea, bright red blood per rectum, melena, or hematemesis Neurologic: negative for visual changes, syncope, or dizziness All other systems reviewed and are otherwise negative except as noted above.    Blood pressure 126/72, pulse 51, height 5\' 7"  (1.702 m), weight 167 lb (75.751 kg).  General appearance: alert and no distress Neck: no adenopathy, no carotid bruit, no JVD, supple, symmetrical, trachea midline and thyroid not enlarged, symmetric, no tenderness/mass/nodules Lungs: clear to auscultation bilaterally Heart: regular rate and rhythm, S1, S2 normal, no murmur, click, rub or gallop Extremities: extremities normal, atraumatic, no cyanosis or edema  EKG sinus bradycardia 51 without ST or T-wave changes. I personally reviewed this EKG  ASSESSMENT AND PLAN:   HYPERLIPIDEMIA History of hyperlipidemia on statin therapy followed by her PCP  Essential hypertension History of hypertension blood pressure measured today at 126/72. She is on lisinopril and metoprolol. Continue current meds at current dosing  MYOCARDIAL INFARCTION, HX OF History of CAD status post inferior STEMI with witnessed sudden cardiac death, CPR, defibrillation and intervention of her RCA 02/15/10. This is coupled K by cardiogenic shock with placement of an intra-aortic balloon pumps. She underwent Artic Sun cooling ultimately woke up without any neurologic residual. LV function normalized.  She's had no recurrent symptoms.      Lorretta Harp MD FACP,FACC,FAHA, [facility] 01/17/2016 4:09 PM

## 2016-01-17 NOTE — Assessment & Plan Note (Signed)
History of hyperlipidemia on statin therapy followed by her PCP. 

## 2016-02-13 LAB — HM PAP SMEAR

## 2016-02-19 ENCOUNTER — Other Ambulatory Visit: Payer: Self-pay | Admitting: Cardiovascular Disease

## 2016-02-20 NOTE — Telephone Encounter (Signed)
Rx(s) sent to pharmacy electronically.  

## 2016-03-09 ENCOUNTER — Other Ambulatory Visit: Payer: Self-pay | Admitting: Cardiovascular Disease

## 2016-03-09 ENCOUNTER — Telehealth: Payer: Self-pay | Admitting: Cardiovascular Disease

## 2016-03-09 MED ORDER — CLOPIDOGREL BISULFATE 75 MG PO TABS
75.0000 mg | ORAL_TABLET | Freq: Every day | ORAL | Status: DC
Start: 2016-03-09 — End: 2016-11-17

## 2016-03-09 NOTE — Telephone Encounter (Signed)
°*  STAT* If patient is at the pharmacy, call can be transferred to refill team.   1. Which medications need to be refilled? (please list name of each medication and dose if known) Clopidogrel-Pharmacist said they have been calling since last Thursday-Please call today 2. Which pharmacy/location (including street and city if local pharmacy) is medication to be sent to?[phone]  3. Do they need a 30 day or 90 day supply?90 and refiils

## 2016-03-09 NOTE — Telephone Encounter (Signed)
Rx request sent to pharmacy.  

## 2016-03-09 NOTE — Telephone Encounter (Signed)
REFILL 

## 2016-03-23 ENCOUNTER — Telehealth: Payer: Self-pay | Admitting: General Practice

## 2016-03-23 NOTE — Telephone Encounter (Signed)
Error

## 2016-03-24 ENCOUNTER — Other Ambulatory Visit: Payer: Self-pay | Admitting: Cardiovascular Disease

## 2016-03-25 NOTE — Telephone Encounter (Signed)
Rx(s) sent to pharmacy electronically.  

## 2016-04-26 ENCOUNTER — Other Ambulatory Visit: Payer: Self-pay | Admitting: Family Medicine

## 2016-05-25 ENCOUNTER — Other Ambulatory Visit: Payer: Self-pay | Admitting: Family Medicine

## 2016-05-26 NOTE — Telephone Encounter (Signed)
No further refills without a physical.

## 2016-06-19 ENCOUNTER — Other Ambulatory Visit: Payer: Self-pay | Admitting: Family Medicine

## 2016-06-19 NOTE — Telephone Encounter (Signed)
Med denied, pt needs an appt.  

## 2016-10-24 ENCOUNTER — Other Ambulatory Visit: Payer: Self-pay | Admitting: Cardiovascular Disease

## 2016-10-26 NOTE — Telephone Encounter (Signed)
Rx request sent to pharmacy.  

## 2016-11-17 ENCOUNTER — Encounter: Payer: Self-pay | Admitting: Cardiovascular Disease

## 2016-11-17 ENCOUNTER — Ambulatory Visit (INDEPENDENT_AMBULATORY_CARE_PROVIDER_SITE_OTHER): Payer: 59 | Admitting: Cardiovascular Disease

## 2016-11-17 DIAGNOSIS — I252 Old myocardial infarction: Secondary | ICD-10-CM

## 2016-11-17 DIAGNOSIS — I10 Essential (primary) hypertension: Secondary | ICD-10-CM | POA: Diagnosis not present

## 2016-11-17 MED ORDER — NITROGLYCERIN 0.4 MG SL SUBL: 0.4000 mg | SUBLINGUAL_TABLET | SUBLINGUAL | 4 refills | Status: DC | PRN

## 2016-11-17 MED ORDER — CLOPIDOGREL BISULFATE 75 MG PO TABS: 75.0000 mg | ORAL_TABLET | Freq: Every day | ORAL | 3 refills | Status: DC

## 2016-11-17 MED ORDER — ALPRAZOLAM 0.25 MG PO TABS: 0.2500 mg | ORAL_TABLET | Freq: Every evening | ORAL | 5 refills | Status: DC | PRN

## 2016-11-17 MED ORDER — METOPROLOL TARTRATE 50 MG PO TABS: 50.0000 mg | ORAL_TABLET | Freq: Two times a day (BID) | ORAL | 6 refills | Status: DC

## 2016-11-17 MED ORDER — ROSUVASTATIN CALCIUM 10 MG PO TABS: 10.0000 mg | ORAL_TABLET | Freq: Every day | ORAL | 11 refills | Status: DC

## 2016-11-17 MED ORDER — PANTOPRAZOLE SODIUM 40 MG PO TBEC: 40.0000 mg | DELAYED_RELEASE_TABLET | Freq: Two times a day (BID) | ORAL | 11 refills | Status: DC

## 2016-11-17 MED ORDER — POTASSIUM CHLORIDE CRYS ER 20 MEQ PO TBCR: 20.0000 meq | EXTENDED_RELEASE_TABLET | Freq: Every day | ORAL | 2 refills | Status: DC

## 2016-11-17 MED ORDER — LISINOPRIL 10 MG PO TABS: 5.0000 mg | ORAL_TABLET | Freq: Every day | ORAL | 6 refills | Status: DC

## 2016-11-17 NOTE — Progress Notes (Signed)
11/17/2016 Vicki [name]   [birth year]  [id number]  Primary Physician Annye Asa, MD Primary Cardiologist: Lorretta Harp MD Renae Gloss  HPI:  The patient is a delightful 60 year old, mildly overweight married Caucasian female mother of 69, whose husband is also a patient of mine. I last saw her 01/17/16. 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 She had an inferior STEMI with witnessed VF arrest, CPR, defibrillation and intervention requiring stenting in the dominant RCA by myself on February 15, 2010. This was complicated by cardiogenic shock with placement of an intraaortic balloon pump. She was an [facility] patient. 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 She has stopped smoking since that time, and her other problems include hyperlipidemia, well controlled on statin therapy. She was a smoker with a 35-pack-year history of tobacco abuse up until that time. She denies chest pain or shortness of breath. She is scheduled for surgical removal of what appears to be a sebaceous cyst in the operating room tomorrow by Dr. Armandina Gemma under general anesthesia. She has not stopped her aspirin and Plavix at my request. She developed fatigue symptoms 2 summers ago and saw Cecilie Kicks registered nurse practitioner who performed a Myoview stress test that was normal. She decreased her beta blocker dosage resulting in marked improvement in her symptoms. Since I saw her one year ago she is completely asymptomatic.    Current Outpatient Prescriptions  Medication Sig Dispense Refill  . ALPRAZolam (XANAX) 0.25 MG tablet TAKE 1 TABLET BY MOUTH EVERY NIGHT AT BEDTIME AS NEEDED 30 tablet 3  . aspirin 81 MG tablet Take 81 mg by mouth daily.     . cetirizine (ZYRTEC) 5 MG tablet Take 5 mg by mouth daily.      . clopidogrel (PLAVIX) 75 MG tablet Take 1 tablet (75 mg total) by mouth daily. 90 tablet 3  . COD LIVER OIL PO Take 1 tablet by mouth daily.    . Cyanocobalamin (VITAMIN B 12 PO) Take 500 mg  by mouth daily.    Marland Kitchen lisinopril (PRINIVIL,ZESTRIL) 10 MG tablet TAKE 1 TABLET BY MOUTH EVERY DAY 30 tablet 0  . magnesium gluconate (MAGONATE) 500 MG tablet Take 500 mg by mouth daily.    . metoprolol (LOPRESSOR) 50 MG tablet TAKE 1 TABLET BY MOUTH TWICE DAILY 60 tablet 3  . montelukast (SINGULAIR) 10 MG tablet TAKE 1 TABLET BY MOUTH AT BEDTIME (Patient taking differently: TAKE 1 TABLET BY MOUTH IN THE SPRING.) 30 tablet 0  . nitroGLYCERIN (NITROSTAT) 0.4 MG SL tablet Place 1 tablet (0.4 mg total) under the tongue every 5 (five) minutes as needed for chest pain. 50 tablet 4  . pantoprazole (PROTONIX) 40 MG tablet TAKE 1 TABLET BY MOUTH TWICE DAILY 60 tablet 11  . potassium chloride SA (K-DUR,KLOR-CON) 20 MEQ tablet Take 1 tablet (20 mEq total) by mouth daily. 90 tablet 2  . rosuvastatin (CRESTOR) 10 MG tablet TAKE 1 TABLET(10 MG) BY MOUTH DAILY 30 tablet 11  . triamcinolone ointment (KENALOG) 0.1 % Apply 1 application topically 2 (two) times daily. 90 g 1  . Vitamin D, Ergocalciferol, (DRISDOL) 50000 units CAPS capsule Take 5,000 Units by mouth every 7 (seven) days.     No current facility-administered medications for this visit.     No Known Allergies  Social History   Social History  . Marital status: Married    Spouse name: N/A  . Number of children: 2  . Years  of education: N/A   Occupational History  . Glass blower/designer at [employer]  . Smoking status: Former Smoker    Quit date: 01/28/2010  . Smokeless tobacco: Never Used  . Alcohol use 0.0 oz/week    1 - 2 Glasses of wine per week  . Drug use: No  . Sexual activity: Not on file   Other Topics Concern  . Not on file   Social History Narrative  . No narrative on file     Review of Systems: General: negative for chills, fever, night sweats or weight changes.  Cardiovascular: negative for chest pain, dyspnea on exertion, edema, orthopnea, palpitations, paroxysmal nocturnal dyspnea or  shortness of breath Dermatological: negative for rash Respiratory: negative for cough or wheezing Urologic: negative for hematuria Abdominal: negative for nausea, vomiting, diarrhea, bright red blood per rectum, melena, or hematemesis Neurologic: negative for visual changes, syncope, or dizziness All other systems reviewed and are otherwise negative except as noted above.    Blood pressure 126/76, pulse (!) 56, height 5\' 6"  (1.676 m), weight 161 lb (73 kg).  General appearance: alert and no distress Neck: no adenopathy, no carotid bruit, no JVD, supple, symmetrical, trachea midline and thyroid not enlarged, symmetric, no tenderness/mass/nodules Lungs: clear to auscultation bilaterally Heart: regular rate and rhythm, S1, S2 normal, no murmur, click, rub or gallop Extremities: extremities normal, atraumatic, no cyanosis or edema  EKG sinus bradycardia 56 with poor R-wave progression. I personally reviewed this EKG  ASSESSMENT AND PLAN:   HYPERLIPIDEMIA History of hyperlipidemia on statin therapy followed by her PCP  Essential hypertension History of hypertension blood pressure 126/76. She is on lisinopril and metoprolol. Continue current meds at current dosing  MYOCARDIAL INFARCTION, HX OF History of CAD status post inferior STEMI with witnessed VF arrest, CPR, defibrillation and intervention requiring stenting of her dominant RCA by myself 02/15/10. Her course was completed by cardiac shock with placement of an intra-aortic balloon pump. She was articulate son patient with an RV infarct. She has done well since. Myoview stress test performed 07/20/13 was nonischemic. She denies chest pain or shortness of breath. She does remain on dual antiplatelet therapy.      Lorretta Harp MD FACP,FACC,FAHA, [facility] 11/17/2016 9:32 AM

## 2016-11-17 NOTE — Patient Instructions (Signed)
Medication Instructions: Your physician recommends that you continue on your current medications as directed. Please refer to the Current Medication list given to you today.   Labwork: I will request lab work from Dr. Ernie Hew.   Follow-Up: Your physician wants you to follow-up in: 1 year with Dr. Gwenlyn Found. You will receive a reminder letter in the mail two months in advance. If you don't receive a letter, please call our office to schedule the follow-up appointment.  If you need a refill on your cardiac medications before your next appointment, please call your pharmacy.

## 2016-11-17 NOTE — Assessment & Plan Note (Signed)
History of CAD status post inferior STEMI with witnessed VF arrest, CPR, defibrillation and intervention requiring stenting of her dominant RCA by myself 02/15/10. Her course was completed by cardiac shock with placement of an intra-aortic balloon pump. She was articulate son patient with an RV infarct. She has done well since. Myoview stress test performed 07/20/13 was nonischemic. She denies chest pain or shortness of breath. She does remain on dual antiplatelet therapy.

## 2016-11-17 NOTE — Assessment & Plan Note (Signed)
History of hyperlipidemia on statin therapy followed by her PCP. 

## 2016-11-17 NOTE — Assessment & Plan Note (Signed)
History of hypertension blood pressure 126/76. She is on lisinopril and metoprolol. Continue current meds at current dosing

## 2016-11-20 ENCOUNTER — Telehealth: Payer: Self-pay | Admitting: Cardiovascular Disease

## 2016-11-20 NOTE — Telephone Encounter (Signed)
Requesting surgical clearance:  1. Type of procedure:  Colonoscopy  2. Surgeon: Dr. Penelope Coop  3.Surgical Date:  01/06/17  4. Medications that need to be held: Plavix   5. CAD: Yes  6. I will defer to:  Dr. Pearla Dubonnet Information:   [name] Phone:  [phone]  Fax:  [phone]

## 2016-11-20 NOTE — Telephone Encounter (Signed)
Okay to hold antiplatelet therapy for colonoscopy. 

## 2016-11-25 NOTE — Telephone Encounter (Signed)
Will fax this note to the number provided. 

## 2016-12-21 ENCOUNTER — Telehealth: Payer: Self-pay | Admitting: Cardiovascular Disease

## 2016-12-21 NOTE — Telephone Encounter (Signed)
Received fax from [facility] asking for clarification on how many days to hold Plavix prior to Colonoscopy scheduled for 01/06/17.  Protocol per pharmacy is to hold Plavix and ASA 81 for 7 days prior to procedure.  Will route to fax number provided via EPIC.

## 2016-12-24 ENCOUNTER — Telehealth: Payer: Self-pay | Admitting: Cardiovascular Disease

## 2016-12-24 NOTE — Telephone Encounter (Signed)
error 

## 2017-02-12 ENCOUNTER — Ambulatory Visit (INDEPENDENT_AMBULATORY_CARE_PROVIDER_SITE_OTHER): Payer: 59 | Admitting: Family Medicine

## 2017-02-12 ENCOUNTER — Encounter: Payer: Self-pay | Admitting: Family Medicine

## 2017-02-12 VITALS — BP 121/81 | HR 76 | Temp 98.0°F | Resp 16 | Ht 68.0 in | Wt 167.5 lb

## 2017-02-12 DIAGNOSIS — I10 Essential (primary) hypertension: Secondary | ICD-10-CM | POA: Diagnosis not present

## 2017-02-12 DIAGNOSIS — Z1231 Encounter for screening mammogram for malignant neoplasm of breast: Secondary | ICD-10-CM | POA: Diagnosis not present

## 2017-02-12 DIAGNOSIS — E785 Hyperlipidemia, unspecified: Secondary | ICD-10-CM

## 2017-02-12 LAB — CBC WITH DIFFERENTIAL/PLATELET
Basophils Absolute: 0 cells/uL (ref 0–200)
Basophils Relative: 0 %
Eosinophils Absolute: 183 cells/uL (ref 15–500)
Eosinophils Relative: 3 %
HCT: 38.2 % (ref 35.0–45.0)
Hemoglobin: 12.5 g/dL (ref 11.7–15.5)
Lymphocytes Relative: 47 %
Lymphs Abs: 2867 cells/uL (ref 850–3900)
MCH: 29.6 pg (ref 27.0–33.0)
MCHC: 32.7 g/dL (ref 32.0–36.0)
MCV: 90.3 fL (ref 80.0–100.0)
MPV: 9.9 fL (ref 7.5–12.5)
Monocytes Absolute: 610 cells/uL (ref 200–950)
Monocytes Relative: 10 %
Neutro Abs: 2440 cells/uL (ref 1500–7800)
Neutrophils Relative %: 40 %
Platelets: 260 10*3/uL (ref 140–400)
RBC: 4.23 MIL/uL (ref 3.80–5.10)
RDW: 13.4 % (ref 11.0–15.0)
WBC: 6.1 10*3/uL (ref 3.8–10.8)

## 2017-02-12 LAB — LIPID PANEL
Cholesterol: 172 mg/dL (ref <200)
HDL: 40 mg/dL — ABNORMAL LOW (ref >50)
LDL Cholesterol: 89 mg/dL (ref <100)
Total CHOL/HDL Ratio: 4.3 Ratio (ref <5.0)
Triglycerides: 215 mg/dL — ABNORMAL HIGH (ref <150)
VLDL: 43 mg/dL — ABNORMAL HIGH (ref <30)

## 2017-02-12 LAB — HEPATIC FUNCTION PANEL
ALT: 14 U/L (ref 6–29)
AST: 21 U/L (ref 10–35)
Albumin: 4 g/dL (ref 3.6–5.1)
Alkaline Phosphatase: 77 U/L (ref 33–130)
Bilirubin, Direct: 0.1 mg/dL (ref <=0.2)
Indirect Bilirubin: 0.2 mg/dL (ref 0.2–1.2)
Total Bilirubin: 0.3 mg/dL (ref 0.2–1.2)
Total Protein: 7.1 g/dL (ref 6.1–8.1)

## 2017-02-12 LAB — BASIC METABOLIC PANEL
BUN: 14 mg/dL (ref 7–25)
CO2: 23 mmol/L (ref 20–31)
Calcium: 9.1 mg/dL (ref 8.6–10.4)
Chloride: 106 mmol/L (ref 98–110)
Creat: 0.77 mg/dL (ref 0.50–0.99)
Glucose, Bld: 77 mg/dL (ref 65–99)
Potassium: 4.2 mmol/L (ref 3.5–5.3)
Sodium: 138 mmol/L (ref 135–146)

## 2017-02-12 MED ORDER — MONTELUKAST SODIUM 10 MG PO TABS: 10.0000 mg | ORAL_TABLET | Freq: Every day | ORAL | 6 refills | Status: DC

## 2017-02-12 NOTE — Assessment & Plan Note (Signed)
Chronic problem.  Well controlled today.  Asymptomatic.  Check labs.  No anticipated med changes.  Will follow. 

## 2017-02-12 NOTE — Patient Instructions (Signed)
Schedule your complete physical in 6 months We'll notify you of your lab results and make any changes if needed Continue to work on healthy diet and regular exercise- you look great! The order is in for the mammogram.  If you don't hear from the breast center in the next few weeks, call them at [phone] to schedule Call Dr Penelope Coop and tell him you are ready for the colonoscopy but worry that you won't get an adequate prep b/c you can't stomach all the liquid and ask if there's an alternative Call with any questions or concerns [place]!!!

## 2017-02-12 NOTE — Progress Notes (Signed)
Pre visit review using our clinic review tool, if applicable. No additional management support is needed unless otherwise documented below in the visit note. 

## 2017-02-12 NOTE — Progress Notes (Signed)
   Subjective:    Patient ID: Vicki [name], female    DOB: [birth year], 61 y.o.   MRN: [id number]  HPI  Pt to re-establish.  Pt has been seeing Dr Ernie Hew for the last year.  HTN- chronic problem, on Lisinopril, Metoprolol w/ good control.  Denies CP, SOB, HAs, visual changes, edema.  Hyperlipidemia- chronic problem, on Crestor.  Denies abd pain, N/V, myalgias.  Due for [place] see HPI     Objective:   Physical Exam  Constitutional: She is oriented to person, place, and time. She appears well-developed and well-nourished. No distress.  HENT:  Head: Normocephalic and atraumatic.  Eyes: Conjunctivae and EOM are normal. Pupils are equal, round, and reactive to light.  Neck: Normal range of motion. Neck supple. No thyromegaly present.  Cardiovascular: Normal rate, regular rhythm, normal heart sounds and intact distal pulses.   No murmur heard. Pulmonary/Chest: Effort normal and breath sounds normal. No respiratory distress.  Abdominal: Soft. She exhibits no distension. There is no tenderness.  Musculoskeletal: She exhibits no edema.  Lymphadenopathy:    She has no cervical adenopathy.  Neurological: She is alert and oriented to person, place, and time.  Skin: Skin is warm and dry.  Psychiatric: She has a normal mood and affect. Her behavior is normal.  Vitals reviewed.         Assessment & Plan:

## 2017-02-12 NOTE — Assessment & Plan Note (Signed)
Chronic problem, tolerating statin w/o difficulty.  Stressed need for healthy diet and regular exercise.  Check labs.  Adjust meds prn

## 2017-02-13 LAB — TSH: TSH: 1.43 mIU/L

## 2017-02-16 ENCOUNTER — Other Ambulatory Visit: Payer: Self-pay | Admitting: Family Medicine

## 2017-02-16 DIAGNOSIS — Z1231 Encounter for screening mammogram for malignant neoplasm of breast: Secondary | ICD-10-CM

## 2017-02-22 ENCOUNTER — Other Ambulatory Visit: Payer: Self-pay | Admitting: Family Medicine

## 2017-03-03 ENCOUNTER — Encounter: Payer: Self-pay | Admitting: General Practice

## 2017-03-09 ENCOUNTER — Ambulatory Visit
Admission: RE | Admit: 2017-03-09 | Discharge: 2017-03-09 | Disposition: A | Discharge status: Home / Self Care | Payer: 59 | Source: Ambulatory Visit | Attending: Family Medicine | Admitting: Family Medicine

## 2017-03-09 DIAGNOSIS — Z1231 Encounter for screening mammogram for malignant neoplasm of breast: Secondary | ICD-10-CM

## 2017-04-08 ENCOUNTER — Telehealth: Payer: Self-pay | Admitting: Family Medicine

## 2017-04-08 ENCOUNTER — Telehealth: Payer: Self-pay | Admitting: Cardiovascular Disease

## 2017-04-08 ENCOUNTER — Ambulatory Visit: Payer: Self-pay | Admitting: Family Medicine

## 2017-04-08 DIAGNOSIS — I252 Old myocardial infarction: Secondary | ICD-10-CM

## 2017-04-08 DIAGNOSIS — R079 Chest pain, unspecified: Secondary | ICD-10-CM

## 2017-04-08 NOTE — Telephone Encounter (Signed)
Spoke with pt she states that today she had indigestion but it went away and she states that left shoulder pain this morning. Yesterday she states that she had indigestion really bad all day and she took Tums thruout the day and she went to bed early and it went away when she took the tums and her xanax and went to bed but still had upon waking but now it has went away. Pt does not have BP cuff so she is unable to take BP or HR. But pt states she does not feel HR pounding or any chest pain or pressure or any other symptoms, except the left shoulder pain.pt states that yesterday for about an hour to and hour and a half her fingertips were numb.

## 2017-04-08 NOTE — Telephone Encounter (Signed)
Spoke with DOD she states that pt should have Ajo scheduled and try taking Zantac along with current dose of Protonix.   pt notified appt scheduled with dr Gwenlyn Found 02-12-17 to discuss indigestion and Lexiscan

## 2017-04-08 NOTE — Telephone Encounter (Signed)
Follow up    Pt is calling back about this message. She is asking for someone to call her back.

## 2017-04-08 NOTE — Telephone Encounter (Signed)
I find these symptoms very concerning given her hx.  Please have her call [facility] ASAP

## 2017-04-08 NOTE — Telephone Encounter (Signed)
FYI -  Patient was added by TH to the schedule at 2pm today

## 2017-04-08 NOTE — Telephone Encounter (Signed)
Pt stated she isn't feeling well and yesterday her finger tips was numb for a hour but now they're not. Pt had bad indigestion yesterday also. Right now pt is having shoulder pain, something going on with her chest but no chest pain maybe burning and fatigue. Please advise pt.

## 2017-04-08 NOTE — Telephone Encounter (Signed)
Spoke with patient regarding symptoms. Patient reports indigestion yesterday that is slightly improved today. She has experienced sporadic finger numbness, shoulder pain and weakness for several days. Denies chest pain, shortness of breath or jaw pain (which she experienced with last MI). Patient has already called cardiology is still awaiting a return phone call (x 1.5 hours). Advised patient to call cardiology again for an urgent appointment or go to the Emergency Department. Patient hesitant to go to the ED stating she doesn't feel that it's emergent. Encouraged patient again to go to the Emergency Department for evaluation explaining they have more resources available for her. Patient verbalized understanding, plans to call cardiology again and go to ED if they cannot see her.

## 2017-04-08 NOTE — Telephone Encounter (Signed)
Patient Name: Rossie Ruddick  DOB: 10-Feb-1956    Initial Comment light indigestion, shoulder hurts, weak, bad indigestion last night, fingers went numb some yesterday had heart attack 7 yrs ago, had same symptoms at that time   Nurse Assessment  Nurse: Raphael Gibney, RN, Vanita Ingles Date/Time (Eastern Time): 04/08/2017 10:29:56 AM  Confirm and document reason for call. If symptomatic, describe symptoms. ---Caller states she had indigestion last night and took omeprazole and 5-6 tums. Had pain in the center of her chest. Has had left shoulder pain for a week. Hurts when moves but not at rest. Had numbness in 10 fingers yesterday for about 45 min. No numbness now. No chest pain now. No vomiting.  Does the patient have any new or worsening symptoms? ---Yes  Will a triage be completed? ---Yes  Related visit to physician within the last 2 weeks? ---No  Does the PT have any chronic conditions? (i.e. diabetes, asthma, etc.) ---Yes  List chronic conditions. ---MI  Is this a behavioral health or substance abuse call? ---No     Guidelines    Guideline Title Affirmed Question Affirmed Notes  Chest Pain Chest pain lasts > 5 minutes (Exceptions: chest pain occurring > 3 days ago and now asymptomatic; same as previously diagnosed heartburn and has accompanying sour taste in mouth)    Final Disposition User   Go to ED Now (or PCP triage) Raphael Gibney, RN, Vanita Ingles    Comments  office is open; appt scheduled at 2 pm 04/08/17 with Dr. Dimple Nanas vs sending pt to the ER   Referrals  REFERRED TO PCP OFFICE   Disagree/Comply: Comply

## 2017-04-14 ENCOUNTER — Encounter: Payer: Self-pay | Admitting: Cardiovascular Disease

## 2017-04-14 ENCOUNTER — Ambulatory Visit (INDEPENDENT_AMBULATORY_CARE_PROVIDER_SITE_OTHER): Payer: 59 | Admitting: Cardiovascular Disease

## 2017-04-14 ENCOUNTER — Telehealth (HOSPITAL_COMMUNITY): Payer: Self-pay

## 2017-04-14 VITALS — BP 112/78 | HR 65 | Ht 66.5 in | Wt 168.2 lb

## 2017-04-14 DIAGNOSIS — E7800 Pure hypercholesterolemia, unspecified: Secondary | ICD-10-CM

## 2017-04-14 DIAGNOSIS — I252 Old myocardial infarction: Secondary | ICD-10-CM

## 2017-04-14 DIAGNOSIS — I10 Essential (primary) hypertension: Secondary | ICD-10-CM

## 2017-04-14 NOTE — Progress Notes (Signed)
04/14/2017 Zollie Scale   02-25-1956  [id number]  Primary Physician Midge Minium, MD Primary Cardiologist: Lorretta Harp MD Renae Gloss  HPI:  The patient is a delightful 61 year old, mildly overweight married Caucasian female mother of 44, whose husband is also a patient of mine. I last saw her 11/17/16. 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 She had an inferior STEMI with witnessed VF arrest, CPR, defibrillation and intervention requiring stenting in the dominant RCA by myself on February 15, 2010. This was complicated by cardiogenic shock with placement of an intraaortic balloon pump. She was an [facility] patient. 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 She has stopped smoking since that time, and her other problems include hyperlipidemia, well controlled on statin therapy. She was a smoker with a 35-pack-year history of tobacco abuse up until that time. She denies chest pain or shortness of breath. She is scheduled for surgical removal of what appears to be a sebaceous cyst in the operating room tomorrow by Dr. Armandina Gemma under general anesthesia. She has not stopped her aspirin and Plavix at my request. She developed fatigue symptoms 2 summers ago and saw Cecilie Kicks registered nurse practitioner who performed a Myoview stress test that was normal. She decreased her beta blocker dosage resulting in marked improvement in her symptoms. Since I saw her 6 months ago. She has developed some atypical, musculoskeletal sounding left shoulder pain. She's also had an episode of indigestion (for hours at a time. Because the atypical nature of her symptoms back in March 2011 which resulted in a inferior STEMI I'm going to obtain a pharmacologic Myoview stress test to rule out an ischemic etiology.   Current Outpatient Prescriptions  Medication Sig Dispense Refill  . ALPRAZolam (XANAX) 0.25 MG tablet Take 1 tablet (0.25 mg total) by mouth at bedtime as needed. 30 tablet 5  . aspirin 81  MG tablet Take 81 mg by mouth daily.     . cetirizine (ZYRTEC) 5 MG tablet Take 5 mg by mouth daily.      . Cholecalciferol (VITAMIN D3) 5000 units CAPS Take by mouth.    . clopidogrel (PLAVIX) 75 MG tablet Take 1 tablet (75 mg total) by mouth daily. 90 tablet 3  . COD LIVER OIL PO Take 1 tablet by mouth daily.    . Cyanocobalamin (VITAMIN B 12 PO) Take 500 mg by mouth daily.    Marland Kitchen escitalopram (LEXAPRO) 10 MG tablet TAKE 1 TABLET BY MOUTH EVERY DAY 90 tablet 0  . lisinopril (PRINIVIL,ZESTRIL) 10 MG tablet Take 0.5 tablets (5 mg total) by mouth daily. 30 tablet 6  . metoprolol (LOPRESSOR) 50 MG tablet Take 1 tablet (50 mg total) by mouth 2 (two) times daily. 60 tablet 6  . montelukast (SINGULAIR) 10 MG tablet Take 1 tablet (10 mg total) by mouth at bedtime. 30 tablet 6  . nitroGLYCERIN (NITROSTAT) 0.4 MG SL tablet Place 1 tablet (0.4 mg total) under the tongue every 5 (five) minutes as needed for chest pain. 50 tablet 4  . pantoprazole (PROTONIX) 40 MG tablet Take 1 tablet (40 mg total) by mouth 2 (two) times daily. 60 tablet 11  . potassium chloride SA (K-DUR,KLOR-CON) 20 MEQ tablet Take 1 tablet (20 mEq total) by mouth daily. 90 tablet 2  . rosuvastatin (CRESTOR) 10 MG tablet Take 1 tablet (10 mg total) by mouth daily. 30 tablet 11   No current facility-administered medications for this visit.  No Known Allergies  Social History   Social History  . Marital status: Married    Spouse name: N/A  . Number of children: 2  . Years of education: N/A   Occupational History  . 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 Smoking status: Former Smoker    Quit date: 01/28/2010  . Smokeless tobacco: Never Used  . Alcohol use 0.0 oz/week    1 - 2 Glasses of wine per week  . Drug use: No  . Sexual activity: Not on file   Other Topics Concern  . Not on file   Social History Narrative  . No narrative on file     Review of Systems: General: negative for chills,  fever, night sweats or weight changes.  Cardiovascular: negative for chest pain, dyspnea on exertion, edema, orthopnea, palpitations, paroxysmal nocturnal dyspnea or shortness of breath Dermatological: negative for rash Respiratory: negative for cough or wheezing Urologic: negative for hematuria Abdominal: negative for nausea, vomiting, diarrhea, bright red blood per rectum, melena, or hematemesis Neurologic: negative for visual changes, syncope, or dizziness All other systems reviewed and are otherwise negative except as noted above.    Blood pressure 112/78, pulse 65, height 5' 6.5" (1.689 m), weight 168 lb 3.2 oz (76.3 kg).  General appearance: alert and no distress Neck: no adenopathy, no carotid bruit, no JVD, supple, symmetrical, trachea midline and thyroid not enlarged, symmetric, no tenderness/mass/nodules Lungs: clear to auscultation bilaterally Heart: regular rate and rhythm, S1, S2 normal, no murmur, click, rub or gallop Extremities: extremities normal, atraumatic, no cyanosis or edema  EKG /65 with PACs. I personally reviewed this EKG.  ASSESSMENT AND PLAN:   Hyperlipidemia History of hyperlipidemia on statin therapy with recent lipid profile performed 02/12/17 revealed total cholesterol 172, LDL 89 and HDL of 40.  Essential hypertension History of essential hypertension blood pressure measured 112/78. She is on lisinopril and metoprolol. Continue current meds at current dosing.  MYOCARDIAL INFARCTION, HX OF History of inferior STEMI or sudden cardiac death treated with PCI and stenting insertion of intra-aortic balloon pump 02/15/10. She ultimately recovered completely. Her last Myoview in 2014 was nonischemic. She has developed some fatigue, left shoulder pain and heartburn. I'm going to obtain a primary neurologic Myoview stress test this coming Friday to rule out an ischemic etiology.      Lorretta Harp MD FACP,FACC,FAHA, [facility] 04/14/2017 3:43 PM

## 2017-04-14 NOTE — Patient Instructions (Signed)
Your physician recommends that you schedule a follow-up appointment in: 3 months with Dr Berry 

## 2017-04-14 NOTE — Assessment & Plan Note (Signed)
History of inferior STEMI or sudden cardiac death treated with PCI and stenting insertion of intra-aortic balloon pump 02/15/10. She ultimately recovered completely. Her last Myoview in 2014 was nonischemic. She has developed some fatigue, left shoulder pain and heartburn. I'm going to obtain a primary neurologic Myoview stress test this coming Friday to rule out an ischemic etiology.

## 2017-04-14 NOTE — Assessment & Plan Note (Signed)
History of essential hypertension blood pressure measured 112/78. She is on lisinopril and metoprolol. Continue current meds at current dosing.

## 2017-04-14 NOTE — Assessment & Plan Note (Signed)
History of hyperlipidemia on statin therapy with recent lipid profile performed 02/12/17 revealed total cholesterol 172, LDL 89 and HDL of 40.

## 2017-04-14 NOTE — Telephone Encounter (Signed)
Encounter complete. 

## 2017-04-16 ENCOUNTER — Ambulatory Visit (HOSPITAL_COMMUNITY)
Admission: RE | Admit: 2017-04-16 | Discharge: 2017-04-16 | Disposition: A | Discharge status: Home / Self Care | Payer: 59 | Source: Ambulatory Visit | Attending: Internal Medicine | Admitting: Internal Medicine

## 2017-04-16 DIAGNOSIS — R079 Chest pain, unspecified: Secondary | ICD-10-CM | POA: Diagnosis not present

## 2017-04-16 DIAGNOSIS — I10 Essential (primary) hypertension: Secondary | ICD-10-CM | POA: Diagnosis not present

## 2017-04-16 DIAGNOSIS — Z87891 Personal history of nicotine dependence: Secondary | ICD-10-CM | POA: Diagnosis not present

## 2017-04-16 DIAGNOSIS — R5383 Other fatigue: Secondary | ICD-10-CM | POA: Insufficient documentation

## 2017-04-16 DIAGNOSIS — I2510 Atherosclerotic heart disease of native coronary artery without angina pectoris: Secondary | ICD-10-CM | POA: Insufficient documentation

## 2017-04-16 DIAGNOSIS — R531 Weakness: Secondary | ICD-10-CM | POA: Diagnosis not present

## 2017-04-16 DIAGNOSIS — I252 Old myocardial infarction: Secondary | ICD-10-CM | POA: Diagnosis not present

## 2017-04-16 LAB — MYOCARDIAL PERFUSION IMAGING
LV dias vol: 97 mL (ref 46–106)
LV sys vol: 37 mL
Peak HR: 85 {beats}/min
Rest HR: 51 {beats}/min
SDS: 6
SRS: 4
SSS: 9
TID: 1.1

## 2017-04-16 MED ORDER — REGADENOSON 0.4 MG/5ML IV SOLN
0.4000 mg | Freq: Once | INTRAVENOUS | Status: AC
Start: 2017-04-16 — End: 2017-04-16
  Administered 2017-04-16: 0.4 mg via INTRAVENOUS

## 2017-04-16 MED ORDER — TECHNETIUM TC 99M TETROFOSMIN IV KIT
10.4000 | PACK | Freq: Once | INTRAVENOUS | Status: AC | PRN
  Administered 2017-04-16: 10.4 via INTRAVENOUS
  Filled 2017-04-16: qty 11

## 2017-04-16 MED ORDER — AMINOPHYLLINE 25 MG/ML IV SOLN
75.0000 mg | Freq: Once | INTRAVENOUS | Status: AC
  Administered 2017-04-16: 75 mg via INTRAVENOUS

## 2017-04-16 MED ORDER — TECHNETIUM TC 99M TETROFOSMIN IV KIT
31.2000 | PACK | Freq: Once | INTRAVENOUS | Status: AC | PRN
  Administered 2017-04-16: 31.2 via INTRAVENOUS
  Filled 2017-04-16: qty 32

## 2017-05-19 ENCOUNTER — Other Ambulatory Visit: Payer: Self-pay | Admitting: Cardiovascular Disease

## 2017-05-26 ENCOUNTER — Other Ambulatory Visit: Payer: Self-pay | Admitting: Family Medicine

## 2017-05-26 NOTE — Telephone Encounter (Signed)
Follow up pt said this is the third attempt to refill her medication     *STAT* If patient is at the pharmacy, call can be transferred to refill team.   1. Which medications need to be refilled? (please list name of each medication and dose if known) Rezaian 0.25   2. Which pharmacy/location (including street and city if local pharmacy) is medication to be sent to? [facility] on [address]  3. Do they need a 30 day or 90 day supply? [place]

## 2017-06-03 MED ORDER — ALPRAZOLAM 0.25 MG PO TABS: 0.2500 mg | ORAL_TABLET | Freq: Every evening | ORAL | 3 refills | Status: DC | PRN

## 2017-06-03 NOTE — Addendum Note (Signed)
Addended by: Therisa Doyne on: 06/03/2017 03:18 PM   Modules accepted: Orders

## 2017-06-03 NOTE — Telephone Encounter (Signed)
Rx request sent to pharmacy.  

## 2017-07-23 DIAGNOSIS — B078 Other viral warts: Secondary | ICD-10-CM | POA: Diagnosis not present

## 2017-07-23 DIAGNOSIS — B079 Viral wart, unspecified: Secondary | ICD-10-CM | POA: Diagnosis not present

## 2017-07-23 DIAGNOSIS — L300 Nummular dermatitis: Secondary | ICD-10-CM | POA: Diagnosis not present

## 2017-07-23 DIAGNOSIS — L608 Other nail disorders: Secondary | ICD-10-CM | POA: Diagnosis not present

## 2017-07-23 DIAGNOSIS — L603 Nail dystrophy: Secondary | ICD-10-CM | POA: Diagnosis not present

## 2017-08-11 ENCOUNTER — Ambulatory Visit (INDEPENDENT_AMBULATORY_CARE_PROVIDER_SITE_OTHER): Payer: 59 | Admitting: Family Medicine

## 2017-08-11 ENCOUNTER — Encounter: Payer: Self-pay | Admitting: Family Medicine

## 2017-08-11 ENCOUNTER — Encounter: Payer: Self-pay | Admitting: General Practice

## 2017-08-11 VITALS — BP 113/80 | HR 56 | Temp 98.1°F | Resp 16 | Ht 68.0 in | Wt 166.2 lb

## 2017-08-11 DIAGNOSIS — E7800 Pure hypercholesterolemia, unspecified: Secondary | ICD-10-CM

## 2017-08-11 DIAGNOSIS — Z0000 Encounter for general adult medical examination without abnormal findings: Secondary | ICD-10-CM

## 2017-08-11 DIAGNOSIS — Z23 Encounter for immunization: Secondary | ICD-10-CM

## 2017-08-11 DIAGNOSIS — I10 Essential (primary) hypertension: Secondary | ICD-10-CM | POA: Diagnosis not present

## 2017-08-11 LAB — HEPATIC FUNCTION PANEL
ALT: 13 U/L (ref 0–35)
AST: 19 U/L (ref 0–37)
Albumin: 4 g/dL (ref 3.5–5.2)
Alkaline Phosphatase: 91 U/L (ref 39–117)
Bilirubin, Direct: 0 mg/dL (ref 0.0–0.3)
Total Bilirubin: 0.3 mg/dL (ref 0.2–1.2)
Total Protein: 6.5 g/dL (ref 6.0–8.3)

## 2017-08-11 LAB — TSH: TSH: 1.4 u[IU]/mL (ref 0.35–4.50)

## 2017-08-11 LAB — LIPID PANEL
Cholesterol: 146 mg/dL (ref 0–200)
HDL: 51.3 mg/dL (ref >39.00)
LDL Cholesterol: 65 mg/dL (ref 0–99)
NonHDL: 94.23
Total CHOL/HDL Ratio: 3
Triglycerides: 148 mg/dL (ref 0.0–149.0)
VLDL: 29.6 mg/dL (ref 0.0–40.0)

## 2017-08-11 LAB — CBC WITH DIFFERENTIAL/PLATELET
Basophils Absolute: 0 10*3/uL (ref 0.0–0.1)
Basophils Relative: 0.7 % (ref 0.0–3.0)
Eosinophils Absolute: 0.1 10*3/uL (ref 0.0–0.7)
Eosinophils Relative: 2.1 % (ref 0.0–5.0)
HCT: 38 % (ref 36.0–46.0)
Hemoglobin: 12.6 g/dL (ref 12.0–15.0)
Lymphocytes Relative: 30.6 % (ref 12.0–46.0)
Lymphs Abs: 2 10*3/uL (ref 0.7–4.0)
MCHC: 33.3 g/dL (ref 30.0–36.0)
MCV: 90.2 fl (ref 78.0–100.0)
Monocytes Absolute: 0.5 10*3/uL (ref 0.1–1.0)
Monocytes Relative: 7.3 % (ref 3.0–12.0)
Neutro Abs: 3.9 10*3/uL (ref 1.4–7.7)
Neutrophils Relative %: 59.3 % (ref 43.0–77.0)
Platelets: 267 10*3/uL (ref 150.0–400.0)
RBC: 4.22 Mil/uL (ref 3.87–5.11)
RDW: 14 % (ref 11.5–15.5)
WBC: 6.5 10*3/uL (ref 4.0–10.5)

## 2017-08-11 LAB — BASIC METABOLIC PANEL
BUN: 11 mg/dL (ref 6–23)
CO2: 26 mEq/L (ref 19–32)
Calcium: 9.4 mg/dL (ref 8.4–10.5)
Chloride: 107 mEq/L (ref 96–112)
Creatinine, Ser: 0.75 mg/dL (ref 0.40–1.20)
GFR: 82.96 mL/min (ref >60.00)
Glucose, Bld: 94 mg/dL (ref 70–99)
Potassium: 4.1 mEq/L (ref 3.5–5.1)
Sodium: 139 mEq/L (ref 135–145)

## 2017-08-11 NOTE — Progress Notes (Signed)
   Subjective:    Patient ID: Vicki [name], female    DOB: Apr 19, 1956, 61 y.o.   MRN: [id number]  HPI CPE- UTD on Tdap, mammo, pap.  Overdue for colonoscopy- pt is scheduled for 10/31 w/ Dr Penelope Coop.   Review of Systems Patient reports no vision/ hearing changes, adenopathy,fever, weight change,  persistant/recurrent hoarseness , swallowing issues, chest pain, palpitations, edema, persistant/recurrent cough, hemoptysis, dyspnea (rest/exertional/paroxysmal nocturnal), gastrointestinal bleeding (melena, rectal bleeding), abdominal pain, significant heartburn, bowel changes, GU symptoms (dysuria, hematuria, incontinence), Gyn symptoms (abnormal  bleeding, pain),  syncope, focal weakness, memory loss, numbness & tingling, skin/hair/nail changes, abnormal bruising or bleeding, anxiety, or depression.     Objective:   Physical Exam General Appearance:    Alert, cooperative, no distress, appears stated age  Head:    Normocephalic, without obvious abnormality, atraumatic  Eyes:    PERRL, conjunctiva/corneas clear, EOM's intact, fundi    benign, both eyes  Ears:    Normal TM's and external ear canals, both ears  Nose:   Nares normal, septum midline, mucosa normal, no drainage    or sinus tenderness  Throat:   Lips, mucosa, and tongue normal; teeth and gums normal  Neck:   Supple, symmetrical, trachea midline, no adenopathy;    Thyroid: no enlargement/tenderness/nodules  Back:     Symmetric, no curvature, ROM normal, no CVA tenderness  Lungs:     Clear to auscultation bilaterally, respirations unlabored  Chest Wall:    No tenderness or deformity   Heart:    Regular rate and rhythm, S1 and S2 normal, no murmur, rub   or gallop  Breast Exam:    Deferred to mammo  Abdomen:     Soft, non-tender, bowel sounds active all four quadrants,    no masses, no organomegaly  Genitalia:    Deferred to GYN  Rectal:    Extremities:   Extremities normal, atraumatic, no cyanosis or edema  Pulses:   2+ and symmetric  all extremities  Skin:   Skin color, texture, turgor normal, no rashes or lesions  Lymph nodes:   Cervical, supraclavicular, and axillary nodes normal  Neurologic:   CNII-XII intact, normal strength, sensation and reflexes    throughout          Assessment & Plan:

## 2017-08-11 NOTE — Assessment & Plan Note (Signed)
Pt's PE WNL.  UTD on GYN.  Colonoscopy scheduled for next month.  UTD on Tdap.  Flu given today.  Check labs.  Anticipatory guidance provided.

## 2017-08-11 NOTE — Progress Notes (Signed)
Pre visit review using our clinic review tool, if applicable. No additional management support is needed unless otherwise documented below in the visit note. 

## 2017-08-11 NOTE — Assessment & Plan Note (Signed)
Chronic problem.  Well controlled today.  Asymptomatic.  Check labs.  Anticipatory guidance provided.

## 2017-08-11 NOTE — Patient Instructions (Signed)
Follow up in 6 months to recheck BP and cholesterol We'll notify you of your lab results and make any changes if needed Continue to work on healthy diet and regular exercise- you look great! Call with any questions or concerns CONGRATS ON THE BABY!!!

## 2017-08-11 NOTE — Assessment & Plan Note (Signed)
Chronic problem.  Tolerating statin w/o difficulty.  Check labs.  Adjust meds prn  

## 2017-08-27 DIAGNOSIS — J019 Acute sinusitis, unspecified: Secondary | ICD-10-CM | POA: Diagnosis not present

## 2017-08-27 DIAGNOSIS — J209 Acute bronchitis, unspecified: Secondary | ICD-10-CM | POA: Diagnosis not present

## 2017-09-02 DIAGNOSIS — J188 Other pneumonia, unspecified organism: Secondary | ICD-10-CM | POA: Diagnosis not present

## 2017-09-16 ENCOUNTER — Other Ambulatory Visit: Payer: Self-pay | Admitting: Family Medicine

## 2017-09-16 ENCOUNTER — Other Ambulatory Visit: Payer: Self-pay | Admitting: Cardiovascular Disease

## 2017-09-29 ENCOUNTER — Other Ambulatory Visit: Payer: Self-pay | Admitting: Oncology

## 2017-09-29 DIAGNOSIS — K52831 Collagenous colitis: Secondary | ICD-10-CM | POA: Diagnosis not present

## 2017-09-29 DIAGNOSIS — R197 Diarrhea, unspecified: Secondary | ICD-10-CM | POA: Diagnosis not present

## 2017-09-29 DIAGNOSIS — K5289 Other specified noninfective gastroenteritis and colitis: Secondary | ICD-10-CM | POA: Diagnosis not present

## 2017-09-29 LAB — HM COLONOSCOPY

## 2017-10-03 ENCOUNTER — Other Ambulatory Visit: Payer: Self-pay | Admitting: Cardiovascular Disease

## 2017-10-19 ENCOUNTER — Encounter: Payer: Self-pay | Admitting: General Practice

## 2017-10-29 DIAGNOSIS — K52831 Collagenous colitis: Secondary | ICD-10-CM | POA: Diagnosis not present

## 2017-11-26 ENCOUNTER — Other Ambulatory Visit: Payer: Self-pay | Admitting: Family Medicine

## 2017-11-26 ENCOUNTER — Other Ambulatory Visit: Payer: Self-pay | Admitting: Cardiovascular Disease

## 2017-12-06 ENCOUNTER — Telehealth: Payer: Self-pay | Admitting: Cardiovascular Disease

## 2017-12-06 NOTE — Telephone Encounter (Signed)
Pt's pharmacy as requesting a refill on alprazolam. Please address

## 2017-12-06 NOTE — Telephone Encounter (Signed)
Fortunately symptoms anything

## 2017-12-07 ENCOUNTER — Other Ambulatory Visit: Payer: Self-pay | Admitting: Cardiovascular Disease

## 2017-12-07 MED ORDER — ALPRAZOLAM 0.25 MG PO TABS: 0.2500 mg | ORAL_TABLET | Freq: Every evening | ORAL | 3 refills | Status: DC | PRN

## 2017-12-07 NOTE — Telephone Encounter (Signed)
Rx called in to pharmacy. Left detailed message--ok per DPR.

## 2017-12-08 NOTE — Telephone Encounter (Deleted)
Called pharmacy--[facility] in [place] on [address]--Rx request had not yet been picked up. It was sent in for wrong day supply. Rx has been changed to 30 days with 3 refills.  Alprazolam is approved by Dr. Gwenlyn Found for this pt.

## 2017-12-09 NOTE — Telephone Encounter (Signed)
Called pharmacy--[facility] in [place] on [address]--Rx request had not yet been picked up. It was sent in for wrong day supply. Rx has been changed to 30 days with 3 refills.  Alprazolam is approved by Dr. Gwenlyn Found for this pt.

## 2017-12-09 NOTE — Telephone Encounter (Signed)
error 

## 2017-12-12 ENCOUNTER — Other Ambulatory Visit: Payer: Self-pay | Admitting: Cardiovascular Disease

## 2017-12-13 NOTE — Telephone Encounter (Signed)
Rx(s) sent to pharmacy electronically.  

## 2018-01-08 ENCOUNTER — Other Ambulatory Visit: Payer: Self-pay | Admitting: Family Medicine

## 2018-01-10 NOTE — Telephone Encounter (Signed)
Last OV 08/11/17 singulair last filled 02/12/17 #30 with 6

## 2018-01-26 ENCOUNTER — Other Ambulatory Visit: Payer: Self-pay | Admitting: Internal Medicine

## 2018-01-26 ENCOUNTER — Other Ambulatory Visit: Payer: Self-pay | Admitting: Cardiovascular Disease

## 2018-02-08 ENCOUNTER — Other Ambulatory Visit: Payer: Self-pay

## 2018-02-08 ENCOUNTER — Ambulatory Visit (INDEPENDENT_AMBULATORY_CARE_PROVIDER_SITE_OTHER): Payer: 59 | Admitting: Family Medicine

## 2018-02-08 ENCOUNTER — Encounter: Payer: Self-pay | Admitting: Family Medicine

## 2018-02-08 VITALS — BP 130/80 | HR 55 | Temp 98.1°F | Resp 17 | Ht 68.0 in | Wt 166.4 lb

## 2018-02-08 DIAGNOSIS — E7800 Pure hypercholesterolemia, unspecified: Secondary | ICD-10-CM

## 2018-02-08 DIAGNOSIS — Z820 Family history of epilepsy and other diseases of the nervous system: Secondary | ICD-10-CM | POA: Insufficient documentation

## 2018-02-08 DIAGNOSIS — I10 Essential (primary) hypertension: Secondary | ICD-10-CM | POA: Diagnosis not present

## 2018-02-08 DIAGNOSIS — K52831 Collagenous colitis: Secondary | ICD-10-CM | POA: Diagnosis not present

## 2018-02-08 LAB — CBC WITH DIFFERENTIAL/PLATELET
Basophils Absolute: 0 10*3/uL (ref 0.0–0.1)
Basophils Relative: 0.3 % (ref 0.0–3.0)
Eosinophils Absolute: 0.1 10*3/uL (ref 0.0–0.7)
Eosinophils Relative: 1.3 % (ref 0.0–5.0)
HCT: 38.3 % (ref 36.0–46.0)
Hemoglobin: 12.9 g/dL (ref 12.0–15.0)
Lymphocytes Relative: 38.2 % (ref 12.0–46.0)
Lymphs Abs: 2.5 10*3/uL (ref 0.7–4.0)
MCHC: 33.5 g/dL (ref 30.0–36.0)
MCV: 91.1 fl (ref 78.0–100.0)
Monocytes Absolute: 0.5 10*3/uL (ref 0.1–1.0)
Monocytes Relative: 7.9 % (ref 3.0–12.0)
Neutro Abs: 3.4 10*3/uL (ref 1.4–7.7)
Neutrophils Relative %: 52.3 % (ref 43.0–77.0)
Platelets: 266 10*3/uL (ref 150.0–400.0)
RBC: 4.2 Mil/uL (ref 3.87–5.11)
RDW: 14.1 % (ref 11.5–15.5)
WBC: 6.5 10*3/uL (ref 4.0–10.5)

## 2018-02-08 LAB — LIPID PANEL
Cholesterol: 167 mg/dL (ref 0–200)
HDL: 57.1 mg/dL (ref >39.00)
NonHDL: 109.78
Total CHOL/HDL Ratio: 3
Triglycerides: 207 mg/dL — ABNORMAL HIGH (ref 0.0–149.0)
VLDL: 41.4 mg/dL — ABNORMAL HIGH (ref 0.0–40.0)

## 2018-02-08 LAB — BASIC METABOLIC PANEL
BUN: 13 mg/dL (ref 6–23)
CO2: 29 mEq/L (ref 19–32)
Calcium: 9.8 mg/dL (ref 8.4–10.5)
Chloride: 103 mEq/L (ref 96–112)
Creatinine, Ser: 0.74 mg/dL (ref 0.40–1.20)
GFR: 84.12 mL/min (ref >60.00)
Glucose, Bld: 85 mg/dL (ref 70–99)
Potassium: 4 mEq/L (ref 3.5–5.1)
Sodium: 139 mEq/L (ref 135–145)

## 2018-02-08 LAB — HEPATIC FUNCTION PANEL
ALT: 17 U/L (ref 0–35)
AST: 18 U/L (ref 0–37)
Albumin: 4.3 g/dL (ref 3.5–5.2)
Alkaline Phosphatase: 54 U/L (ref 39–117)
Bilirubin, Direct: 0.1 mg/dL (ref 0.0–0.3)
Total Bilirubin: 0.6 mg/dL (ref 0.2–1.2)
Total Protein: 6.9 g/dL (ref 6.0–8.3)

## 2018-02-08 LAB — TSH: TSH: 1.81 u[IU]/mL (ref 0.35–4.50)

## 2018-02-08 LAB — LDL CHOLESTEROL, DIRECT: Direct LDL: 75 mg/dL

## 2018-02-08 NOTE — Assessment & Plan Note (Signed)
Chronic problem.  On Crestor 10mg  daily.  Asymptomatic.  Check labs.  Adjust meds prn

## 2018-02-08 NOTE — Patient Instructions (Signed)
Schedule your complete physical in 6 months We'll notify you of your lab results and make any changes if needed Keep up the good work!  You look great! Call with any questions or concerns Happy Spring!!!

## 2018-02-08 NOTE — Assessment & Plan Note (Signed)
Pt's mom, aunt, and uncle all have/had the disease.  Pt is very concerned about this.  No current cognitive concerns.  Explained that there is no test currently available but we will continue to monitor her closely for any changes.  Pt expressed understanding and is in agreement w/ plan.

## 2018-02-08 NOTE — Progress Notes (Signed)
   Subjective:    Patient ID: Vicki [name], female    DOB: 1956/04/12, 62 y.o.   MRN: [id number]  HPI HTN- chronic problem, on Lisinopril 10mg  daily, Metoprolol 50mg  BID w/ adequate control.  No CP, SOB, HAs, visual changes, edema.  Hyperlipidemia- chronic problem, on Crestor 10mg  daily.  No abd pain, N/V.  No regular exercise.  Collagenous colitis- pt had colonoscopy w/ Dr Penelope Coop and was started on Budesonide daily.  'it's like a whole new life'.  Family history of Alzheimer's- mom passed 2 weeks ago.  Mom's sister and brother both have Alzheimer's and mom's grandmother had it as well.     Review of Systems For ROS see HPI     Objective:   Physical Exam  Constitutional: She is oriented to person, place, and time. She appears well-developed and well-nourished. No distress.  HENT:  Head: Normocephalic and atraumatic.  Eyes: Conjunctivae and EOM are normal. Pupils are equal, round, and reactive to light.  Neck: Normal range of motion. Neck supple. No thyromegaly present.  Cardiovascular: Normal rate, regular rhythm, normal heart sounds and intact distal pulses.  No murmur heard. Pulmonary/Chest: Effort normal and breath sounds normal. No respiratory distress.  Abdominal: Soft. She exhibits no distension. There is no tenderness.  Musculoskeletal: She exhibits no edema.  Lymphadenopathy:    She has no cervical adenopathy.  Neurological: She is alert and oriented to person, place, and time.  Skin: Skin is warm and dry.  Psychiatric: She has a normal mood and affect. Her behavior is normal.  Vitals reviewed.         Assessment & Plan:

## 2018-02-08 NOTE — Assessment & Plan Note (Signed)
Chronic problem.  Adequate control.  Asymptomatic.  Check labs.  No anticipated med changes 

## 2018-02-08 NOTE — Assessment & Plan Note (Signed)
Following w/ Dr Penelope Coop.  Now on Budesonide daily.  Pt is thrilled with the improvement.  Will follow along.

## 2018-02-09 ENCOUNTER — Other Ambulatory Visit: Payer: Self-pay | Admitting: Cardiovascular Disease

## 2018-02-09 ENCOUNTER — Encounter: Payer: Self-pay | Admitting: General Practice

## 2018-02-11 ENCOUNTER — Other Ambulatory Visit: Payer: Self-pay | Admitting: Cardiovascular Disease

## 2018-02-11 NOTE — Telephone Encounter (Signed)
REFILL 

## 2018-03-05 ENCOUNTER — Other Ambulatory Visit: Payer: Self-pay | Admitting: Family Medicine

## 2018-03-10 ENCOUNTER — Telehealth: Payer: Self-pay

## 2018-03-10 NOTE — Telephone Encounter (Signed)
   [facility] Pre-operative Risk Assessment    Request for surgical clearance:  1. What type of surgery is being performed? Extraction  2. When is this surgery scheduled? TBD  3. What type of clearance is required (medical clearance vs. Pharmacy clearance to hold med vs. Both)? Pharmacy  4. Are there any medications that need to be held prior to surgery and how long? Plavix  Practice name and name of physician performing surgery? [facility]  5. What is your office phone number [phone]   7.   What is your office fax number [phone]  8.   Anesthesia type (None, local, MAC, general) ? unknown   Kathyrn Lass 03/10/2018, 4:07 PM  _________________________________________________________________   (provider comments below)

## 2018-03-15 NOTE — Telephone Encounter (Signed)
Clearance faxed via faxed machine 

## 2018-03-15 NOTE — Telephone Encounter (Signed)
   Contacted Dr. Gwenlyn Found and it is okay for her to hold the Plavix for 5-7 days for the procedure. Restart after the procedure at the discretion of the dentist.  Will route this to the requesting doctor and removed from the preop Pool.  Rosaria Ferries, PA-C 03/15/2018 3:55 PM Beeper [phone]

## 2018-03-17 DIAGNOSIS — B078 Other viral warts: Secondary | ICD-10-CM | POA: Diagnosis not present

## 2018-04-18 ENCOUNTER — Other Ambulatory Visit: Payer: Self-pay | Admitting: Cardiovascular Disease

## 2018-04-19 ENCOUNTER — Telehealth: Payer: Self-pay | Admitting: Cardiovascular Disease

## 2018-04-19 ENCOUNTER — Other Ambulatory Visit: Payer: Self-pay | Admitting: Cardiovascular Disease

## 2018-04-19 DIAGNOSIS — N764 Abscess of vulva: Secondary | ICD-10-CM | POA: Diagnosis not present

## 2018-04-19 DIAGNOSIS — K52831 Collagenous colitis: Secondary | ICD-10-CM | POA: Diagnosis not present

## 2018-04-19 DIAGNOSIS — T17308A Unspecified foreign body in larynx causing other injury, initial encounter: Secondary | ICD-10-CM | POA: Diagnosis not present

## 2018-04-19 NOTE — Telephone Encounter (Signed)
Rx(s) sent to pharmacy electronically.  

## 2018-04-19 NOTE — Telephone Encounter (Signed)
Called patient and LVM to call back to schedule followup with Dr. Gwenlyn Found.

## 2018-04-26 ENCOUNTER — Other Ambulatory Visit: Payer: Self-pay | Admitting: Cardiovascular Disease

## 2018-04-29 ENCOUNTER — Telehealth: Payer: Self-pay | Admitting: Cardiovascular Disease

## 2018-04-29 NOTE — Telephone Encounter (Signed)
Called patient and LVM to call back to schedule yearly followup with Dr. Gwenlyn Found.

## 2018-04-30 ENCOUNTER — Other Ambulatory Visit: Payer: Self-pay | Admitting: Cardiovascular Disease

## 2018-05-03 ENCOUNTER — Telehealth: Payer: Self-pay | Admitting: Cardiovascular Disease

## 2018-05-03 NOTE — Telephone Encounter (Signed)
This is the 3rd message I have left to call and schedule her followup with Dr. Gwenlyn Found.

## 2018-05-03 NOTE — Telephone Encounter (Signed)
Rx(s) sent to pharmacy electronically.  

## 2018-05-18 ENCOUNTER — Other Ambulatory Visit: Payer: Self-pay | Admitting: Cardiovascular Disease

## 2018-05-19 NOTE — Telephone Encounter (Signed)
Rx(s) sent to pharmacy electronically. MD OV 06/01/18

## 2018-05-22 ENCOUNTER — Other Ambulatory Visit: Payer: Self-pay | Admitting: Cardiovascular Disease

## 2018-05-23 NOTE — Telephone Encounter (Signed)
Rx request sent to pharmacy.  

## 2018-05-30 ENCOUNTER — Other Ambulatory Visit: Payer: Self-pay | Admitting: Cardiovascular Disease

## 2018-05-30 ENCOUNTER — Other Ambulatory Visit: Payer: Self-pay | Admitting: Family Medicine

## 2018-05-31 NOTE — Telephone Encounter (Signed)
Rx request sent to pharmacy.  

## 2018-06-01 ENCOUNTER — Ambulatory Visit (INDEPENDENT_AMBULATORY_CARE_PROVIDER_SITE_OTHER): Payer: 59 | Admitting: Cardiovascular Disease

## 2018-06-01 ENCOUNTER — Encounter: Payer: Self-pay | Admitting: Cardiovascular Disease

## 2018-06-01 VITALS — BP 149/85 | HR 54 | Ht 66.5 in | Wt 171.2 lb

## 2018-06-01 DIAGNOSIS — I10 Essential (primary) hypertension: Secondary | ICD-10-CM

## 2018-06-01 DIAGNOSIS — E7800 Pure hypercholesterolemia, unspecified: Secondary | ICD-10-CM

## 2018-06-01 DIAGNOSIS — I2510 Atherosclerotic heart disease of native coronary artery without angina pectoris: Secondary | ICD-10-CM | POA: Diagnosis not present

## 2018-06-01 NOTE — Assessment & Plan Note (Signed)
History of CAD status post inferior STEMI 0/97/3532 complicated by cardiogenic shock, intra-aortic balloon pump and Arctic sun.  She had PCI and stenting of her RCA.  She ultimately recovered completely without any neurologic deficit.  Her LV function is normal and she had a nonischemic Myoview as recently as 04/16/2017.  She is completely asymptomatic and remains on dual antiplatelet therapy.

## 2018-06-01 NOTE — Assessment & Plan Note (Signed)
History of hyperlipidemia on low-dose statin therapy with lipid profile performed 02/08/2018 revealing total cholesterol 167, try glyceride level of 207 and HDL 57.  She is taking a oral steroid for her colitis which may affect this.  We will recheck a lipid liver profile in 3 months.

## 2018-06-01 NOTE — Progress Notes (Signed)
06/01/2018 Zollie Scale   March 14, 1956  [id number]  Primary Physician Midge Minium, MD Primary Cardiologist: Lorretta Harp MD Garret Reddish, [place], [place]  HPI:  Vicki [name] is a 62 y.o.  mildly overweight married Caucasian female mother of 46, whose husband is also a patient of mine. I last saw her  04/14/2017. 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 She had an inferior STEMI with witnessed VF arrest, CPR, defibrillation and intervention requiring stenting in the dominant RCA by myself on February 15, 2010. This was complicated by cardiogenic shock with placement of an intraaortic balloon pump. She was an [facility] patient. 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 She has stopped smoking since that time, and her other problems include hyperlipidemia, well controlled on statin therapy. She was a smoker with a 35-pack-year history of tobacco abuse up until that time. She denies chest pain or shortness of breath. She is scheduled for surgical removal of what appears to be a sebaceous cyst in the operating room tomorrow by Dr. Armandina Gemma under general anesthesia. She has not stopped her aspirin and Plavix at my request. She developed fatigue symptoms 2 summers ago and saw Cecilie Kicks registered nurse practitioner who performed a Myoview stress test that was normal. She decreased her beta blocker dosage resulting in marked improvement in her symptoms. Since I saw her 6 months ago. She has developed some atypical, musculoskeletal sounding left shoulder pain. She's also had an episode of indigestion (for hours at a time. Because the atypical nature of her symptoms back in March 2011 I obtained a Myoview stress test on 04/16/2017 which was entirely normal. Since I saw her last she underwent colonoscopy by Dr. Penelope Coop which revealed collagenous colitis for which she is on an oral steroid with marked improvement.  She denies chest pain or shortness of breath.    Current Meds  Medication Sig  . ALPRAZolam  (XANAX) 0.25 MG tablet TAKE 1 TABLET BY MOUTH EVERY NIGHT AT BEDTIME AS NEEDED  . aspirin 81 MG tablet Take 81 mg by mouth daily.   . budesonide (ENTOCORT EC) 3 MG 24 hr capsule TK 3 CS PO QD  . cetirizine (ZYRTEC) 5 MG tablet Take 5 mg by mouth daily.    . Cholecalciferol (VITAMIN D3) 5000 units CAPS Take by mouth.  . clopidogrel (PLAVIX) 75 MG tablet TAKE 1 TABLET(75 MG) BY MOUTH DAILY  . COD LIVER OIL PO Take 1 tablet by mouth daily.  . Cyanocobalamin (VITAMIN B 12 PO) Take 500 mg by mouth daily.  Marland Kitchen Dextromethorphan-Guaifenesin (MUCINEX DM) 30-600 MG TB12 TK 1 T PO BID PRN  . escitalopram (LEXAPRO) 10 MG tablet TAKE 1 TABLET BY MOUTH EVERY DAY  . lisinopril (PRINIVIL,ZESTRIL) 10 MG tablet TAKE 1 TABLET BY MOUTH DAILY  . metoprolol tartrate (LOPRESSOR) 50 MG tablet TAKE 1 TABLET(50 MG) BY MOUTH TWICE DAILY  . montelukast (SINGULAIR) 10 MG tablet TAKE 1 TABLET(10 MG) BY MOUTH AT BEDTIME  . nitroGLYCERIN (NITROSTAT) 0.4 MG SL tablet PLACE 1 TABLET UNDER THE TONGUE EVERY 5 MINUTES AS NEEDED FOR CHEST PAIN  . pantoprazole (PROTONIX) 40 MG tablet TAKE 1 TABLET BY MOUTH TWICE DAILY  . potassium chloride SA (K-DUR,KLOR-CON) 20 MEQ tablet Take 1 tablet (20 mEq total) by mouth daily. PLEASE CONTACT OFFICE FOR ADDITIONAL REFILLS  . rosuvastatin (CRESTOR) 10 MG tablet TAKE 1 TABLET BY MOUTH EVERY DAY     No Known Allergies  Social History  Socioeconomic History  . Marital status: Married    Spouse name: Not on file  . Number of children: 2  . Years of education: Not on file  . Highest education level: Not on file  Occupational History  . 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 Financial resource strain: Not on file  . Food insecurity:    Worry: Not on file    Inability: Not on file  . Transportation needs:    Medical: Not on file    Non-medical: Not on file  Tobacco Use  . Smoking status: Former Smoker    Last attempt to quit: 01/28/2010    Years since quitting:  8.3  . Smokeless tobacco: Never Used  Substance and Sexual Activity  . Alcohol use: Yes    Alcohol/week: 0.6 - 1.2 oz    Types: 1 - 2 Glasses of wine per week  . Drug use: No  . Sexual activity: Not on file  Lifestyle  . Physical activity:    Days per week: Not on file    Minutes per session: Not on file  . Stress: Not on file  Relationships  . Social connections:    Talks on phone: Not on file    Gets together: Not on file    Attends religious service: Not on file    Active member of club or organization: Not on file    Attends meetings of clubs or organizations: Not on file    Relationship status: Not on file  . Intimate partner violence:    Fear of current or ex partner: Not on file    Emotionally abused: Not on file    Physically abused: Not on file    Forced sexual activity: Not on file  Other Topics Concern  . Not on file  Social History Narrative  . Not on file     Review of Systems: General: negative for chills, fever, night sweats or weight changes.  Cardiovascular: negative for chest pain, dyspnea on exertion, edema, orthopnea, palpitations, paroxysmal nocturnal dyspnea or shortness of breath Dermatological: negative for rash Respiratory: negative for cough or wheezing Urologic: negative for hematuria Abdominal: negative for nausea, vomiting, diarrhea, bright red blood per rectum, melena, or hematemesis Neurologic: negative for visual changes, syncope, or dizziness All other systems reviewed and are otherwise negative except as noted above.    Blood pressure (!) 149/85, pulse (!) 54, height 5' 6.5" (1.689 m), weight 171 lb 3.2 oz (77.7 kg).  General appearance: alert and no distress Neck: no adenopathy, no carotid bruit, no JVD, supple, symmetrical, trachea midline and thyroid not enlarged, symmetric, no tenderness/mass/nodules Lungs: clear to auscultation bilaterally Heart: regular rate and rhythm, S1, S2 normal, no murmur, click, rub or gallop Extremities:  extremities normal, atraumatic, no cyanosis or edema Pulses: 2+ and symmetric Skin: Skin color, texture, turgor normal. No rashes or lesions Neurologic: Alert and oriented X 3, normal strength and tone. Normal symmetric reflexes. Normal coordination and gait  EKG sinus bradycardia 54 without ST or T wave changes.  I personally reviewed this EKG.  ASSESSMENT AND PLAN:   Hyperlipidemia History of hyperlipidemia on low-dose statin therapy with lipid profile performed 02/08/2018 revealing total cholesterol 167, try glyceride level of 207 and HDL 57.  She is taking a oral steroid for her colitis which may affect this.  We will recheck a lipid liver profile in 3 months.  Essential hypertension History of essential hypertension her blood pressure measured today 149/85.  She is on lisinopril and metoprolol.  Continue current meds at current dosing.  CAD (coronary atherosclerotic disease), STEMI 2011 with emergent BMS to RCA, residual 60 % LAD disease.  Associated V fib arrest. History of CAD status post inferior STEMI 07/02/2121 complicated by cardiogenic shock, intra-aortic balloon pump and Arctic sun.  She had PCI and stenting of her RCA.  She ultimately recovered completely without any neurologic deficit.  Her LV function is normal and she had a nonischemic Myoview as recently as 04/16/2017.  She is completely asymptomatic and remains on dual antiplatelet therapy.      Lorretta Harp MD FACP,FACC,FAHA, [facility] 06/01/2018 9:54 AM

## 2018-06-01 NOTE — Assessment & Plan Note (Signed)
History of essential hypertension her blood pressure measured today 149/85.  She is on lisinopril and metoprolol.  Continue current meds at current dosing.

## 2018-06-01 NOTE — Patient Instructions (Signed)
Medication Instructions: Your physician recommends that you continue on your current medications as directed. Please refer to the Current Medication list given to you today.  Labwork: Your physician recommends that you return for a FASTING lipid profile and hepatic function panel in 3 months (October).   Follow-Up: Your physician wants you to follow-up in: 1 year with Dr. Gwenlyn Found. You will receive a reminder letter in the mail two months in advance. If you don't receive a letter, please call our office to schedule the follow-up appointment.  If you need a refill on your cardiac medications before your next appointment, please call your pharmacy.

## 2018-06-13 DIAGNOSIS — D23122 Other benign neoplasm of skin of left lower eyelid, including canthus: Secondary | ICD-10-CM | POA: Diagnosis not present

## 2018-06-13 DIAGNOSIS — D23121 Other benign neoplasm of skin of left upper eyelid, including canthus: Secondary | ICD-10-CM | POA: Diagnosis not present

## 2018-06-20 ENCOUNTER — Other Ambulatory Visit: Payer: Self-pay | Admitting: Cardiovascular Disease

## 2018-07-03 ENCOUNTER — Other Ambulatory Visit: Payer: Self-pay | Admitting: Cardiovascular Disease

## 2018-07-05 ENCOUNTER — Other Ambulatory Visit: Payer: Self-pay | Admitting: *Deleted

## 2018-07-05 MED ORDER — ALPRAZOLAM 0.25 MG PO TABS: 0.2500 mg | ORAL_TABLET | Freq: Every evening | ORAL | 1 refills | Status: DC | PRN

## 2018-08-10 DIAGNOSIS — Z01419 Encounter for gynecological examination (general) (routine) without abnormal findings: Secondary | ICD-10-CM | POA: Diagnosis not present

## 2018-08-10 DIAGNOSIS — Z6827 Body mass index (BMI) 27.0-27.9, adult: Secondary | ICD-10-CM | POA: Diagnosis not present

## 2018-08-10 LAB — HM PAP SMEAR

## 2018-08-12 ENCOUNTER — Ambulatory Visit (INDEPENDENT_AMBULATORY_CARE_PROVIDER_SITE_OTHER): Payer: 59 | Admitting: Family Medicine

## 2018-08-12 ENCOUNTER — Encounter: Payer: Self-pay | Admitting: Family Medicine

## 2018-08-12 ENCOUNTER — Other Ambulatory Visit: Payer: Self-pay

## 2018-08-12 VITALS — BP 130/80 | HR 50 | Temp 98.1°F | Resp 16 | Ht 67.0 in | Wt 171.1 lb

## 2018-08-12 DIAGNOSIS — Z0000 Encounter for general adult medical examination without abnormal findings: Secondary | ICD-10-CM | POA: Diagnosis not present

## 2018-08-12 DIAGNOSIS — I10 Essential (primary) hypertension: Secondary | ICD-10-CM

## 2018-08-12 DIAGNOSIS — Z23 Encounter for immunization: Secondary | ICD-10-CM

## 2018-08-12 LAB — HEPATIC FUNCTION PANEL
ALT: 17 U/L (ref 0–35)
AST: 21 U/L (ref 0–37)
Albumin: 4.4 g/dL (ref 3.5–5.2)
Alkaline Phosphatase: 83 U/L (ref 39–117)
Bilirubin, Direct: 0.1 mg/dL (ref 0.0–0.3)
Total Bilirubin: 0.5 mg/dL (ref 0.2–1.2)
Total Protein: 7.2 g/dL (ref 6.0–8.3)

## 2018-08-12 LAB — LIPID PANEL
Cholesterol: 175 mg/dL (ref 0–200)
HDL: 43.9 mg/dL (ref >39.00)
NonHDL: 130.79
Total CHOL/HDL Ratio: 4
Triglycerides: 318 mg/dL — ABNORMAL HIGH (ref 0.0–149.0)
VLDL: 63.6 mg/dL — ABNORMAL HIGH (ref 0.0–40.0)

## 2018-08-12 LAB — CBC WITH DIFFERENTIAL/PLATELET
Basophils Absolute: 0 10*3/uL (ref 0.0–0.1)
Basophils Relative: 0.4 % (ref 0.0–3.0)
Eosinophils Absolute: 0.1 10*3/uL (ref 0.0–0.7)
Eosinophils Relative: 1.9 % (ref 0.0–5.0)
HCT: 38.9 % (ref 36.0–46.0)
Hemoglobin: 13.2 g/dL (ref 12.0–15.0)
Lymphocytes Relative: 33 % (ref 12.0–46.0)
Lymphs Abs: 2.3 10*3/uL (ref 0.7–4.0)
MCHC: 33.9 g/dL (ref 30.0–36.0)
MCV: 90.1 fl (ref 78.0–100.0)
Monocytes Absolute: 0.5 10*3/uL (ref 0.1–1.0)
Monocytes Relative: 7.1 % (ref 3.0–12.0)
Neutro Abs: 4.1 10*3/uL (ref 1.4–7.7)
Neutrophils Relative %: 57.6 % (ref 43.0–77.0)
Platelets: 287 10*3/uL (ref 150.0–400.0)
RBC: 4.31 Mil/uL (ref 3.87–5.11)
RDW: 13.8 % (ref 11.5–15.5)
WBC: 7.1 10*3/uL (ref 4.0–10.5)

## 2018-08-12 LAB — LDL CHOLESTEROL, DIRECT: Direct LDL: 94 mg/dL

## 2018-08-12 LAB — BASIC METABOLIC PANEL
BUN: 14 mg/dL (ref 6–23)
CO2: 25 mEq/L (ref 19–32)
Calcium: 9.7 mg/dL (ref 8.4–10.5)
Chloride: 105 mEq/L (ref 96–112)
Creatinine, Ser: 0.74 mg/dL (ref 0.40–1.20)
GFR: 83.98 mL/min (ref >60.00)
Glucose, Bld: 84 mg/dL (ref 70–99)
Potassium: 4.8 mEq/L (ref 3.5–5.1)
Sodium: 139 mEq/L (ref 135–145)

## 2018-08-12 LAB — TSH: TSH: 1.22 u[IU]/mL (ref 0.35–4.50)

## 2018-08-12 NOTE — Patient Instructions (Addendum)
Follow up in 6 months to recheck BP and cholesterol We'll notify you of your lab results and make any changes if needed Continue to work on healthy diet and regular exercise- you look great! Call and schedule your mammogram Call with any questions or concerns Happy Early Birthday!!!

## 2018-08-12 NOTE — Assessment & Plan Note (Signed)
Chronic problem, well controlled today.  Asymptomatic.  Check labs.  No anticipated med changes.  Will follow. 

## 2018-08-12 NOTE — Assessment & Plan Note (Signed)
Pt's PE WNL.  UTD on pap, due for mammo (pt plans to schedule).  UTD on colonoscopy.  Flu shot given.  Check labs.  Anticipatory guidance provided.

## 2018-08-12 NOTE — Progress Notes (Signed)
   Subjective:    Patient ID: Vicki [name], female    DOB: Oct 18, 1956, 62 y.o.   MRN: [id number]  HPI CPE- UTD on pap, colonoscopy.  Due for mammo- pt plans to schedule.  UTD on Tdap.  Due for flu.   Review of Systems Patient reports no vision/ hearing changes, adenopathy,fever, weight change,  persistant/recurrent hoarseness , swallowing issues, chest pain, palpitations, edema, persistant/recurrent cough, hemoptysis, dyspnea (rest/exertional/paroxysmal nocturnal), gastrointestinal bleeding (melena, rectal bleeding), abdominal pain, significant heartburn, bowel changes, GU symptoms (dysuria, hematuria, incontinence), Gyn symptoms (abnormal  bleeding, pain),  syncope, focal weakness, memory loss, numbness & tingling, skin/hair/nail changes, abnormal bruising or bleeding, anxiety, or depression.     Objective:   Physical Exam General Appearance:    Alert, cooperative, no distress, appears stated age  Head:    Normocephalic, without obvious abnormality, atraumatic  Eyes:    PERRL, conjunctiva/corneas clear, EOM's intact, fundi    benign, both eyes  Ears:    Normal TM's and external ear canals, both ears  Nose:   Nares normal, septum midline, mucosa normal, no drainage    or sinus tenderness  Throat:   Lips, mucosa, and tongue normal; teeth and gums normal  Neck:   Supple, symmetrical, trachea midline, no adenopathy;    Thyroid: no enlargement/tenderness/nodules  Back:     Symmetric, no curvature, ROM normal, no CVA tenderness  Lungs:     Clear to auscultation bilaterally, respirations unlabored  Chest Wall:    No tenderness or deformity   Heart:    Regular rate and rhythm, S1 and S2 normal, no murmur, rub   or gallop  Breast Exam:    Deferred to GYN  Abdomen:     Soft, non-tender, bowel sounds active all four quadrants,    no masses, no organomegaly  Genitalia:    Deferred to GYN  Rectal:    Extremities:   Extremities normal, atraumatic, no cyanosis or edema  Pulses:   2+ and symmetric  all extremities  Skin:   Skin color, texture, turgor normal, no rashes or lesions  Lymph nodes:   Cervical, supraclavicular, and axillary nodes normal  Neurologic:   CNII-XII intact, normal strength, sensation and reflexes    throughout          Assessment & Plan:

## 2018-08-27 ENCOUNTER — Other Ambulatory Visit: Payer: Self-pay | Admitting: Cardiovascular Disease

## 2018-08-27 ENCOUNTER — Other Ambulatory Visit: Payer: Self-pay | Admitting: Family Medicine

## 2018-09-17 ENCOUNTER — Other Ambulatory Visit: Payer: Self-pay | Admitting: Cardiovascular Disease

## 2018-09-19 NOTE — Telephone Encounter (Signed)
Rx printed. Routing to be filled.

## 2018-09-19 NOTE — Telephone Encounter (Signed)
Rx request sent to pharmacy.  

## 2018-09-21 ENCOUNTER — Other Ambulatory Visit: Payer: Self-pay | Admitting: Cardiovascular Disease

## 2018-09-21 MED ORDER — ALPRAZOLAM 0.25 MG PO TABS: 0.2500 mg | ORAL_TABLET | Freq: Every evening | ORAL | 2 refills | Status: DC | PRN

## 2018-09-21 NOTE — Telephone Encounter (Signed)
Is this rx ok to fill

## 2018-09-21 NOTE — Telephone Encounter (Signed)
 *  STAT* If patient is at the pharmacy, call can be transferred to refill team.   1. Which medications need to be refilled? (please list name of each medication and dose if known) ALPRAZolam (XANAX) 0.25 MG tablet  2. Which pharmacy/location (including street and city if local pharmacy) is medication to be sent to? [facility] [phone]  3. Do they need a 30 day or 90 day supply? [place]

## 2018-09-21 NOTE — Addendum Note (Signed)
Addended by: Zebedee Iba on: 09/21/2018 04:42 PM   Modules accepted: Orders

## 2018-09-23 ENCOUNTER — Telehealth: Payer: Self-pay | Admitting: Cardiovascular Disease

## 2018-09-23 MED ORDER — ALPRAZOLAM 0.25 MG PO TABS: 0.2500 mg | ORAL_TABLET | Freq: Every evening | ORAL | 1 refills | Status: DC | PRN

## 2018-09-23 NOTE — Telephone Encounter (Signed)
Returned call to pt pharmacy Verbal refill auth given to pharmacist for  Xanax 0.25mg  daily prn at bedtime #30 R-1

## 2018-09-23 NOTE — Telephone Encounter (Signed)
New message:      Pt c/o medication issue:  1. Name of Medication: ALPRAZolam (XANAX) 0.25 MG tablet  2. How are you currently taking this medication (dosage and times per day)? Take 1 tablet (0.25 mg total) by mouth at bedtime as needed.  3. Are you having a reaction (difficulty breathing--STAT)? No  4. What is your medication issue? Yasi from pharmacy states she needs a fax or a verbal order to fill this prescription for the patient

## 2018-09-23 NOTE — Telephone Encounter (Signed)
Patient called and stated that the pharmacy is saying they did not received this script.

## 2018-10-21 ENCOUNTER — Other Ambulatory Visit: Payer: Self-pay | Admitting: Family Medicine

## 2018-10-23 ENCOUNTER — Other Ambulatory Visit: Payer: Self-pay | Admitting: Cardiovascular Disease

## 2018-11-20 ENCOUNTER — Other Ambulatory Visit: Payer: Self-pay | Admitting: Cardiovascular Disease

## 2018-11-21 MED ORDER — ALPRAZOLAM 0.25 MG PO TABS: 0.2500 mg | ORAL_TABLET | Freq: Every evening | ORAL | 0 refills | Status: DC | PRN

## 2018-11-21 NOTE — Addendum Note (Signed)
Addended by: Jacqulynn Cadet on: 11/21/2018 09:10 AM   Modules accepted: Orders

## 2018-11-25 ENCOUNTER — Ambulatory Visit (INDEPENDENT_AMBULATORY_CARE_PROVIDER_SITE_OTHER): Payer: 59

## 2018-11-25 ENCOUNTER — Ambulatory Visit (INDEPENDENT_AMBULATORY_CARE_PROVIDER_SITE_OTHER): Payer: 59 | Admitting: Family Medicine

## 2018-11-25 ENCOUNTER — Encounter: Payer: Self-pay | Admitting: Family Medicine

## 2018-11-25 VITALS — BP 122/80 | HR 74 | Ht 67.0 in

## 2018-11-25 DIAGNOSIS — M19071 Primary osteoarthritis, right ankle and foot: Secondary | ICD-10-CM | POA: Diagnosis not present

## 2018-11-25 DIAGNOSIS — M79671 Pain in right foot: Secondary | ICD-10-CM

## 2018-11-25 MED ORDER — DICLOFENAC SODIUM 1 % TD GEL: 2.0000 g | Freq: Four times a day (QID) | TRANSDERMAL | 0 refills | Status: DC

## 2018-11-25 NOTE — Patient Instructions (Signed)
Foot Pain  Many things can cause foot pain. Some common causes are:   An injury.   A sprain.   Arthritis.   Blisters.   Bunions.  Follow these instructions at home:  Pay attention to any changes in your symptoms. Take these actions to help with your discomfort:   If directed, put ice on the affected area:  ? Put ice in a plastic bag.  ? Place a towel between your skin and the bag.  ? Leave the ice on for 15-20 minutes, 3?4 times a day for 2 days.   Take over-the-counter and prescription medicines only as told by your health care provider.   Wear comfortable, supportive shoes that fit you well. Do not wear high heels.   Do not stand or walk for long periods of time.   Do not lift a lot of weight. This can put added pressure on your feet.   Do stretches to relieve foot pain and stiffness as told by your health care provider.   Rub your foot gently.   Keep your feet clean and dry.  Contact a health care provider if:   Your pain does not get better after a few days of self-care.   Your pain gets worse.   You cannot stand on your foot.  Get help right away if:   Your foot is numb or tingling.   Your foot or toes are swollen.   Your foot or toes turn white or blue.   You have warmth and redness along your foot.  This information is not intended to replace advice given to you by your health care provider. Make sure you discuss any questions you have with your health care provider.  Document Released: 12/13/2015 Document Revised: 04/23/2016 Document Reviewed: 12/12/2014  Elsevier Interactive Patient Education  2019 Elsevier Inc.

## 2018-11-25 NOTE — Progress Notes (Signed)
Established Patient Office Visit  Subjective:  Patient ID: Vicki [name], female    DOB: Nov 30, 1956  Age: 62 y.o. MRN: [id number]  CC:  Chief Complaint  Patient presents with  . right foot pain    HPI Vicki [name] presents for treatment and evaluation of right foot pain.  She has been dealing with this over the last few weeks and denies any specific injury.  She has a past medical history of peri-unguinal verruca that have been difficult to treat secondary to her immunosuppressive therapy.  She has taken Advil with some relief but therapy with this medication is difficult secondary to therapy with Plavix for cardiovascular disease.  She does not smoke.  Patient is wearing flats today.  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  Past Medical History:  Diagnosis Date  . CAD (coronary artery disease) 2011   with stent to RCA and 60 % LAD disease  . Clotting disorder ([place])   . H/O cardiac arrest 02/15/10   with STEMI- Inf wall  . Hyperlipidemia   . Hypertension   . Myocardial infarct ([place]) 02/15/10  . Shock, cardiogenic ([place]) 01/2010   with MI, IABP    Past Surgical History:  Procedure Laterality Date  . ABDOMINAL HYSTERECTOMY  1987   BSO  . APPENDECTOMY  1987  . CORONARY ANGIOPLASTY WITH STENT PLACEMENT  02/15/10   Stent to prox RCA -BMS  . DOPPLER ECHOCARDIOGRAPHY  05/06/2010   EF =50-55%  lvfx low normal ;no sigificant valvular disease seen  . EXPLORATORY LAPAROTOMY    . HERNIA REPAIR  1980   double bilateral  . MASS EXCISION  09/02/2012   Procedure: EXCISION MASS;  Surgeon: Earnstine Regal, MD;  Location: [place];  Service: General;  Laterality: Left;  Excise mass Left posterior neck  . NM MYOCAR PERF WALL MOTION  05/06/2010   exercise cap 8 mets. ,mild perfusion defect in basal infer.,mid infer., apical infer., region consistent with infarct/scar. no significant ischemia  . right chest wall exploration      Family History  Problem Relation Age of  Onset  . Breast cancer Mother 42  . Cancer Mother        breast  . Cancer Father        colon  . Healthy Brother   . Colon cancer Neg Hx     Social History   Socioeconomic History  . Marital status: Married    Spouse name: Not on file  . Number of children: 2  . Years of education: Not on file  . Highest education level: Not on file  Occupational History  . 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 Financial resource strain: Not on file  . Food insecurity:    Worry: Not on file    Inability: Not on file  . Transportation needs:    Medical: Not on file    Non-medical: Not on file  Tobacco Use  . Smoking status: Former Smoker    Last attempt to quit: 01/28/2010    Years since quitting: 8.8  . Smokeless tobacco: Never Used  Substance and Sexual Activity  . Alcohol use: Yes    Alcohol/week: 1.0 - 2.0 standard drinks    Types: 1 - 2 Glasses of wine per week  . Drug use: No  . Sexual activity: Not on file  Lifestyle  . Physical activity:    Days per week: Not on file  Minutes per session: Not on file  . Stress: Not on file  Relationships  . Social connections:    Talks on phone: Not on file    Gets together: Not on file    Attends religious service: Not on file    Active member of club or organization: Not on file    Attends meetings of clubs or organizations: Not on file    Relationship status: Not on file  . Intimate partner violence:    Fear of current or ex partner: Not on file    Emotionally abused: Not on file    Physically abused: Not on file    Forced sexual activity: Not on file  Other Topics Concern  . Not on file  Social History Narrative  . Not on file    Outpatient Medications Prior to Visit  Medication Sig Dispense Refill  . ALPRAZolam (XANAX) 0.25 MG tablet Take 1 tablet (0.25 mg total) by mouth at bedtime as needed. 30 tablet 0  . aspirin 81 MG tablet Take 81 mg by mouth daily.     . budesonide (ENTOCORT EC) 3 MG 24 hr  capsule TK 3 CS PO QD  5  . cetirizine (ZYRTEC) 5 MG tablet Take 5 mg by mouth daily.      . Cholecalciferol (VITAMIN D3) 5000 units CAPS Take by mouth.    . clopidogrel (PLAVIX) 75 MG tablet TAKE 1 TABLET(75 MG) BY MOUTH DAILY 90 tablet 2  . COD LIVER OIL PO Take 1 tablet by mouth daily.    . Cyanocobalamin (VITAMIN B 12 PO) Take 500 mg by mouth daily.    Marland Kitchen Dextromethorphan-Guaifenesin (MUCINEX DM) 30-600 MG TB12 TK 1 T PO BID PRN  0  . escitalopram (LEXAPRO) 10 MG tablet TAKE 1 TABLET BY MOUTH EVERY DAY 90 tablet 0  . lisinopril (PRINIVIL,ZESTRIL) 10 MG tablet TAKE 1 TABLET BY MOUTH DAILY 90 tablet 3  . metoprolol tartrate (LOPRESSOR) 50 MG tablet TAKE 1 TABLET(50 MG) BY MOUTH TWICE DAILY 180 tablet 0  . montelukast (SINGULAIR) 10 MG tablet TAKE 1 TABLET(10 MG) BY MOUTH AT BEDTIME 30 tablet 6  . nitroGLYCERIN (NITROSTAT) 0.4 MG SL tablet PLACE 1 TABLET UNDER THE TONGUE EVERY 5 MINUTES AS NEEDED FOR CHEST PAIN 25 tablet 0  . pantoprazole (PROTONIX) 40 MG tablet Take 1 tablet (40 mg total) by mouth 2 (two) times daily. 180 tablet 3  . potassium chloride SA (K-DUR,KLOR-CON) 20 MEQ tablet TAKE 1 TABLET BY MOUTH EVERY DAY CONTACT OFFICE FOR ADDITIONAL REFILLS 30 tablet 11  . rosuvastatin (CRESTOR) 10 MG tablet TAKE 1 TABLET BY MOUTH EVERY DAY 90 tablet 2   No facility-administered medications prior to visit.     No Known Allergies  ROS Review of Systems  Constitutional: Negative.   Respiratory: Negative.   Cardiovascular: Negative.   Gastrointestinal: Negative.  Negative for anal bleeding and blood in stool.  Genitourinary: Negative for hematuria.  Musculoskeletal: Positive for arthralgias and gait problem. Negative for joint swelling and myalgias.  Skin: Positive for color change and rash.  Allergic/Immunologic: Positive for immunocompromised state.  Hematological: Does not bruise/bleed easily.  Psychiatric/Behavioral: Negative.       Objective:    Physical Exam  Constitutional:  She is oriented to person, place, and time. She appears well-developed and well-nourished. No distress.  HENT:  Head: Normocephalic and atraumatic.  Right Ear: External ear normal.  Left Ear: External ear normal.  Eyes: Right eye exhibits no discharge. Left eye exhibits no  discharge. No scleral icterus.  Neck: No JVD present. No tracheal deviation present.  Cardiovascular:  Pulses:      Dorsalis pedis pulses are 2+ on the right side.       Posterior tibial pulses are 2+ on the right side.  Pulmonary/Chest: Effort normal. No stridor.  Musculoskeletal:     Right ankle: She exhibits no swelling, no ecchymosis and no deformity. Achilles tendon exhibits normal Thompson's test results.     Right foot: Tenderness and bony tenderness present.       Feet:  Neurological: She is alert and oriented to person, place, and time.  Skin: Skin is warm and dry. Rash noted. She is not diaphoretic.  Psychiatric: She has a normal mood and affect. Her behavior is normal.    BP 122/80   Pulse 74   Ht 5\' 7"  (1.702 m)   SpO2 96%   BMI 26.80 kg/m  Wt Readings from Last 3 Encounters:  08/12/18 171 lb 2 oz (77.6 kg)  06/01/18 171 lb 3.2 oz (77.7 kg)  02/08/18 166 lb 6 oz (75.5 kg)   BP Readings from Last 3 Encounters:  11/25/18 122/80  08/12/18 130/80  06/01/18 (!) 149/85   Guideline developer:  UpToDate (see UpToDate for funding source) Date Released: June 2014  There are no preventive care reminders to display for this patient.  There are no preventive care reminders to display for this patient.  Lab Results  Component Value Date   TSH 1.22 08/12/2018   Lab Results  Component Value Date   WBC 7.1 08/12/2018   HGB 13.2 08/12/2018   HCT 38.9 08/12/2018   MCV 90.1 08/12/2018   PLT 287.0 08/12/2018   Lab Results  Component Value Date   NA 139 08/12/2018   K 4.8 08/12/2018   CO2 25 08/12/2018   GLUCOSE 84 08/12/2018   BUN 14 08/12/2018   CREATININE 0.74 08/12/2018   BILITOT 0.5  08/12/2018   ALKPHOS 83 08/12/2018   AST 21 08/12/2018   ALT 17 08/12/2018   PROT 7.2 08/12/2018   ALBUMIN 4.4 08/12/2018   CALCIUM 9.7 08/12/2018   GFR 83.98 08/12/2018   Lab Results  Component Value Date   CHOL 175 08/12/2018   Lab Results  Component Value Date   HDL 43.90 08/12/2018   Lab Results  Component Value Date   LDLCALC 65 08/11/2017   Lab Results  Component Value Date   TRIG 318.0 (H) 08/12/2018   Lab Results  Component Value Date   CHOLHDL 4 08/12/2018   Lab Results  Component Value Date   HGBA1C  02/15/2010    5.8 (NOTE) The ADA recommends the following therapeutic goal for glycemic control related to Hgb A1c measurement: Goal of therapy: <6.5 Hgb A1c  Reference: American Diabetes Association: Clinical Practice Recommendations 2010, Diabetes Care, 2010, 33: (Suppl  1).      Assessment & Plan:   Problem List Items Addressed This Visit      Other   Foot pain, right - Primary   Relevant Medications   diclofenac sodium (VOLTAREN) 1 % GEL   Other Relevant Orders   DG Foot Complete Right   Ambulatory referral to Sports Medicine      Meds ordered this encounter  Medications  . diclofenac sodium (VOLTAREN) 1 % GEL    Sig: Apply 2 g topically 4 (four) times daily.    Dispense:  100 g    Refill:  0    Follow-up: No follow-ups on  file.   Question sub talar arthritis.  Discussed proper foot wear for patient's foot type as well as orthotics.  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

## 2018-11-27 ENCOUNTER — Other Ambulatory Visit: Payer: Self-pay | Admitting: Family Medicine

## 2018-11-28 ENCOUNTER — Telehealth: Payer: Self-pay | Admitting: Family Medicine

## 2018-11-28 NOTE — Telephone Encounter (Signed)
Copied from [place] [phone]. Topic: General - Other >> Nov 28, 2018 12:02 PM Judyann Munson wrote: Reason for CRM:  patient is calling to state this medication diclofenac sodium (VOLTAREN) 1 % GEL is being held at the pharmacy for pre authorization for insurance. Patient stated the contact number is [phone]

## 2018-11-28 NOTE — Telephone Encounter (Signed)
Will call and begin PA

## 2018-11-29 NOTE — Telephone Encounter (Signed)
PA was completed yesterday & approved; pharmacy already notified.

## 2018-11-29 NOTE — Telephone Encounter (Signed)
Upon further review of pt chart. Medication was prescribed by Dr. Bebe Shaggy office on 11/25/18. PA routed to his office for completion.

## 2018-12-02 ENCOUNTER — Other Ambulatory Visit: Payer: Self-pay

## 2018-12-02 ENCOUNTER — Ambulatory Visit: Payer: Self-pay

## 2018-12-02 ENCOUNTER — Ambulatory Visit (INDEPENDENT_AMBULATORY_CARE_PROVIDER_SITE_OTHER): Payer: 59 | Admitting: Family Medicine

## 2018-12-02 ENCOUNTER — Encounter: Payer: Self-pay | Admitting: Family Medicine

## 2018-12-02 VITALS — BP 130/80 | HR 56 | Temp 98.1°F | Resp 16 | Ht 66.0 in | Wt 171.8 lb

## 2018-12-02 DIAGNOSIS — L723 Sebaceous cyst: Secondary | ICD-10-CM | POA: Diagnosis not present

## 2018-12-02 DIAGNOSIS — L089 Local infection of the skin and subcutaneous tissue, unspecified: Secondary | ICD-10-CM

## 2018-12-02 MED ORDER — DOXYCYCLINE HYCLATE 100 MG PO TABS: 100.0000 mg | ORAL_TABLET | Freq: Two times a day (BID) | ORAL | 0 refills | Status: DC

## 2018-12-02 NOTE — Patient Instructions (Addendum)
Follow up as scheduled on Monday Start the Doxycycline twice daily- take w/ food Tylenol as needed for pain/headache I expect there to be some drainage- dab with gauze or paper towel if needed Wash hair around the area if possible (shampoo won't hurt it but it may sting) and pat dry Call with any questions or concerns You. Are. Tough!!!

## 2018-12-02 NOTE — Progress Notes (Signed)
   Subjective:    Patient ID: Vicki [name], female    DOB: October 13, 1956, 63 y.o.   MRN: [id number]  HPI Ruptured cyst- on top of head, felt something wet on head this AM and cyst was draining.  No pain initially but since then has been mashing on it which is painful.  'it's like a volcano erupting'.  Pt would like cyst capsule removed if possible now that area is open.   Review of Systems For ROS see HPI     Objective:   Physical Exam Vitals signs reviewed.  Constitutional:      General: She is not in acute distress.    Appearance: Normal appearance.  HENT:     Head:     Comments: Pt w/ 2.5 cm ruptured sebaceous cyst at top of head.  Minimal drainage at this point.  Silvery capsule visible through open wound.  Area cleaned with peroxide, remaining debris extracted with forceps, and capsule grasped with alligator forceps and pulled/massaged out of scalp intact.  Area was again cleaned with peroxide, patted dry, and 1 small steri-strip applied Skin:    General: Skin is warm.  Neurological:     General: No focal deficit present.     Mental Status: She is alert and oriented to person, place, and time.  Psychiatric:        Mood and Affect: Mood normal.        Behavior: Behavior normal.        Thought Content: Thought content normal.           Assessment & Plan:  Infected sebaceous cyst- new.  Unclear what caused rupture other than localized infection.  Start Doxy.  Pt tolerated cyst removal w/o difficulty.  Reviewed wound care instructions and red flags that should prompt return.  Pt expressed understanding and is in agreement w/ plan.

## 2018-12-02 NOTE — Telephone Encounter (Signed)
Pt. Reports she has had a marble sized cyst on top of her head "for years and it ruptured this morning.Blood and other material drained out." Request to "checked out." No pain of fever. Appointment made tyoday after speaking to [facility].   Reason for Disposition . [1] Looks infected (spreading redness, pus) AND [2] no fever  Answer Assessment - Initial Assessment Questions 1. APPEARANCE of INJURY: "What does the injury look like?"      Cyst rupture on top of head 2. SIZE: "How large is the cut?"      Dime-sized 3. BLEEDING: "Is it bleeding now?" If so, ask: "Is it difficult to stop?"      No 4. LOCATION: "Where is the injury located?"      Top of head 5. ONSET: "How long ago did the injury occur?"      This morning 6. MECHANISM: "Tell me how it happened."      Just ruptured 7. TETANUS: "When was the last tetanus booster?"     Unsure 8. PREGNANCY: "Is there any chance you are pregnant?" "When was your last menstrual period?"     no  Protocols used: SKIN INJURY-A-AH

## 2018-12-05 ENCOUNTER — Encounter: Payer: Self-pay | Admitting: Family Medicine

## 2018-12-05 ENCOUNTER — Encounter: Payer: 59 | Admitting: Family Medicine

## 2018-12-05 ENCOUNTER — Other Ambulatory Visit: Payer: Self-pay

## 2018-12-05 ENCOUNTER — Ambulatory Visit (INDEPENDENT_AMBULATORY_CARE_PROVIDER_SITE_OTHER): Payer: 59 | Admitting: Family Medicine

## 2018-12-05 VITALS — BP 128/82 | HR 53 | Temp 98.0°F | Resp 15 | Wt 174.8 lb

## 2018-12-05 DIAGNOSIS — L089 Local infection of the skin and subcutaneous tissue, unspecified: Secondary | ICD-10-CM

## 2018-12-05 DIAGNOSIS — L723 Sebaceous cyst: Secondary | ICD-10-CM

## 2018-12-05 NOTE — Progress Notes (Signed)
   Subjective:    Patient ID: Vicki [name], female    DOB: [birth year], 63 y.o.   MRN: [id number]  HPI Infected sebaceous cyst- had clear drainage Friday and Saturday.  Wilburn Mylar 'had junk' come out.  No discomfort since HA resolved overnight into Saturday AM.  No fevers.   Review of Systems For ROS see HPI     Objective:   Physical Exam Vitals signs reviewed.  Constitutional:      Appearance: Normal appearance.  HENT:     Head:     Comments: 0.5 cm opening w/ clean edges on crown of head w/ scabbing and some purulent drainage.  Area cleaned w/ H2O2 until no drainage present and scab was removed w/ forceps until surrounding area was clear.  Area was again cleaned w/ peroxide and dressed w/ small spot bandage Neurological:     Mental Status: She is alert.           Assessment & Plan:  Infected sebaceous cyst- improved since Friday.  Was able to debride scab using forceps and wound was irrigated until it ran clear.  Edges are now flat and clean.  Will allow to heal via secondary intention.  Reviewed supportive care and red flags that should prompt return.  Pt expressed understanding and is in agreement w/ plan.

## 2018-12-05 NOTE — Patient Instructions (Addendum)
Follow up as needed or as scheduled Peroxide the area once or twice daily until it scabs over You can wash hair and pat dry I would cover if out and about but you can leave it open to the air at home This will take awhile to heal as it will heal from the bottom up- don't be discouraged!  And we're happy to look at it at any time! Call with any questions or concerns Here's to a better 2020!!!

## 2019-01-03 ENCOUNTER — Other Ambulatory Visit: Payer: Self-pay | Admitting: Family Medicine

## 2019-01-03 NOTE — Telephone Encounter (Signed)
Last refill:11/21/18 #30, 0 Last OV:12/05/18

## 2019-02-01 ENCOUNTER — Ambulatory Visit (INDEPENDENT_AMBULATORY_CARE_PROVIDER_SITE_OTHER): Payer: 59 | Admitting: Family Medicine

## 2019-02-01 ENCOUNTER — Encounter: Payer: Self-pay | Admitting: Family Medicine

## 2019-02-01 VITALS — BP 128/80 | Temp 98.3°F | Ht 66.0 in | Wt 175.0 lb

## 2019-02-01 DIAGNOSIS — J0110 Acute frontal sinusitis, unspecified: Secondary | ICD-10-CM | POA: Diagnosis not present

## 2019-02-01 MED ORDER — PREDNISONE 5 MG PO TABS: ORAL_TABLET | ORAL | 0 refills | Status: DC

## 2019-02-01 NOTE — Patient Instructions (Signed)
Nice to meet you  Please try things such as zyrtec-D or allegra-D which is an antihistamine and decongestant.  Please try honey, vick's vapor rub, lozenges and humidifer for cough and sore throat  Please follow up with [place] if your symptoms fail to improve.

## 2019-02-01 NOTE — Progress Notes (Signed)
Vicki [name] - 63 y.o. female MRN [id number]  Date of birth: January 03, 1956  SUBJECTIVE:  Including CC & ROS.  Chief Complaint  Patient presents with  . Headache    x2 days  . Cough    Vicki [name] is a 63 y.o. female that is presenting with sinus congestion and headache.  Her symptoms have been ongoing for 2 days.  Denies any specific sick contacts.  Denies any fevers.  Feels like her symptoms are getting worse.  Does not tried any over-the-counter remedies.  No production with cough.  No history of asthma or COPD.  No tobacco use.   Review of Systems  Constitutional: Negative for fever.  HENT: Positive for congestion and sinus pressure.   Respiratory: Positive for cough.   Cardiovascular: Negative for chest pain.  Gastrointestinal: Negative for abdominal pain.    HISTORY: Past Medical, Surgical, Social, and Family History Reviewed & Updated per EMR.   Pertinent Historical Findings include:  Past Medical History:  Diagnosis Date  . CAD (coronary artery disease) 2011   with stent to RCA and 60 % LAD disease  . Clotting disorder ([place])   . H/O cardiac arrest 02/15/10   with STEMI- Inf wall  . Hyperlipidemia   . Hypertension   . Myocardial infarct ([place]) 02/15/10  . Shock, cardiogenic ([place]) 01/2010   with MI, IABP    Past Surgical History:  Procedure Laterality Date  . ABDOMINAL HYSTERECTOMY  1987   BSO  . APPENDECTOMY  1987  . CORONARY ANGIOPLASTY WITH STENT PLACEMENT  02/15/10   Stent to prox RCA -BMS  . DOPPLER ECHOCARDIOGRAPHY  05/06/2010   EF =50-55%  lvfx low normal ;no sigificant valvular disease seen  . EXPLORATORY LAPAROTOMY    . HERNIA REPAIR  1980   double bilateral  . MASS EXCISION  09/02/2012   Procedure: EXCISION MASS;  Surgeon: Earnstine Regal, MD;  Location: [facility];  Service: General;  Laterality: Left;  Excise mass Left posterior neck  . NM MYOCAR PERF WALL MOTION  05/06/2010   exercise cap 8 mets. ,mild perfusion defect in basal infer.,mid  infer., apical infer., region consistent with infarct/scar. no significant ischemia  . right chest wall exploration      No Known Allergies  Family History  Problem Relation Age of Onset  . Breast cancer Mother 43  . Cancer Mother        breast  . Cancer Father        colon  . Healthy Brother   . Colon cancer Neg Hx      Social History   Socioeconomic History  . Marital status: Married    Spouse name: Not on file  . Number of children: 2  . Years of education: Not on file  . Highest education level: Not on file  Occupational History  . 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 Financial resource strain: Not on file  . Food insecurity:    Worry: Not on file    Inability: Not on file  . Transportation needs:    Medical: Not on file    Non-medical: Not on file  Tobacco Use  . Smoking status: Former Smoker    Last attempt to quit: 01/28/2010    Years since quitting: 9.0  . Smokeless tobacco: Never Used  Substance and Sexual Activity  . Alcohol use: Yes    Alcohol/week: 1.0 - 2.0 standard drinks    Types: 1 -  2 Glasses of wine per week  . Drug use: No  . Sexual activity: Not on file  Lifestyle  . Physical activity:    Days per week: Not on file    Minutes per session: Not on file  . Stress: Not on file  Relationships  . Social connections:    Talks on phone: Not on file    Gets together: Not on file    Attends religious service: Not on file    Active member of club or organization: Not on file    Attends meetings of clubs or organizations: Not on file    Relationship status: Not on file  . Intimate partner violence:    Fear of current or ex partner: Not on file    Emotionally abused: Not on file    Physically abused: Not on file    Forced sexual activity: Not on file  Other Topics Concern  . Not on file  Social History Narrative  . Not on file     PHYSICAL EXAM:  VS: BP 128/80   Temp 98.3 F (36.8 C) (Oral)   Ht 5\' 6"  (1.676 m)    Wt 175 lb (79.4 kg)   BMI 28.25 kg/m  Physical Exam Gen: NAD, alert, cooperative with exam,  ENT: normal lips, normal nasal mucosa, tympanic membranes clear and intact bilaterally, normal oropharynx, no cervical lymphadenopathy Eye: normal EOM, normal conjunctiva and lids CV:  no edema, +2 pedal pulses, regular rate and rhythm, S1-S2   Resp: no accessory muscle use, non-labored, clear to auscultation bilaterally, no crackles or wheezes Skin: no rashes, no areas of induration  Neuro: normal tone, normal sensation to touch Psych:  normal insight, alert and oriented MSK: Normal gait, normal strength       ASSESSMENT & PLAN:   Acute non-recurrent frontal sinusitis Likely viral in origin.  Has been ongoing for 2 days. -Provided prednisone. -Counseled on supportive care -Given indications to follow-up.

## 2019-02-01 NOTE — Assessment & Plan Note (Signed)
Likely viral in origin.  Has been ongoing for 2 days. -Provided prednisone. -Counseled on supportive care -Given indications to follow-up.

## 2019-02-06 ENCOUNTER — Other Ambulatory Visit: Payer: Self-pay

## 2019-02-06 ENCOUNTER — Encounter: Payer: Self-pay | Admitting: Family Medicine

## 2019-02-06 ENCOUNTER — Ambulatory Visit (INDEPENDENT_AMBULATORY_CARE_PROVIDER_SITE_OTHER): Payer: 59 | Admitting: Family Medicine

## 2019-02-06 VITALS — BP 124/81 | HR 74 | Temp 98.5°F | Resp 16 | Ht 66.0 in | Wt 169.0 lb

## 2019-02-06 DIAGNOSIS — J189 Pneumonia, unspecified organism: Secondary | ICD-10-CM

## 2019-02-06 MED ORDER — ALBUTEROL SULFATE 108 (90 BASE) MCG/ACT IN AEPB: 2.0000 | INHALATION_SPRAY | RESPIRATORY_TRACT | 3 refills | Status: DC | PRN

## 2019-02-06 MED ORDER — ALBUTEROL SULFATE (2.5 MG/3ML) 0.083% IN NEBU
2.5000 mg | INHALATION_SOLUTION | Freq: Once | RESPIRATORY_TRACT | Status: AC
  Administered 2019-02-06: 2.5 mg via RESPIRATORY_TRACT

## 2019-02-06 MED ORDER — GUAIFENESIN-CODEINE 100-10 MG/5ML PO SYRP: 10.0000 mL | ORAL_SOLUTION | Freq: Three times a day (TID) | ORAL | 0 refills | Status: DC | PRN

## 2019-02-06 MED ORDER — DOXYCYCLINE HYCLATE 100 MG PO TABS: 100.0000 mg | ORAL_TABLET | Freq: Two times a day (BID) | ORAL | 0 refills | Status: DC

## 2019-02-06 NOTE — Progress Notes (Signed)
   Subjective:    Patient ID: Vicki [name], female    DOB: Sep 19, 1956, 63 y.o.   MRN: [id number]  HPI URI- 'it's all right here' pointing to her chest.  No fever.  + chills/sweats.  + post tussive emesis.  Was seen on 3/4 and was treated as viral illness.  Cough is wet, intermittently productive.  + SOB.  + HA.  'everything hurts at this point'.  Pt has hx of recurrent PNA, former smoker   Review of Systems For ROS see HPI     Objective:   Physical Exam Vitals signs reviewed.  Constitutional:      Appearance: She is ill-appearing.  HENT:     Head: Normocephalic and atraumatic.     Right Ear: Tympanic membrane normal.     Left Ear: Tympanic membrane normal.     Nose: Congestion present.     Comments: No TTP over frontal or maxillary sinuses    Mouth/Throat:     Mouth: Mucous membranes are moist.     Pharynx: Oropharynx is clear. No oropharyngeal exudate or posterior oropharyngeal erythema.  Pulmonary:     Breath sounds: Wheezing (diffuse inspiratory/expiratory wheezes- improved s/p neb tx) present.     Comments: Crackles heard over both lung bases Wet, hacking cough Lymphadenopathy:     Cervical: No cervical adenopathy.  Neurological:     Mental Status: She is alert.           Assessment & Plan:  Suspected bilateral LL CAP- new.  Pt has hx of PNA and since last week, sxs have progressively worsened.  She now has crackles in both lower lobes and diffuse wheezing throughout.  Wheezing improved s/p neb tx but crackles and hacking cough remain.  Start Doxy.  Albuterol prn.  Cough syrup prn.  Reviewed supportive care and red flags that should prompt return.  Pt expressed understanding and is in agreement w/ plan.

## 2019-02-06 NOTE — Patient Instructions (Signed)
Follow up as needed or as scheduled START the Doxycycline twice daily- take w/ food Use the Albuterol inhaler- 2 puffs every 4 hrs as needed for cough/shortness of breath Use the cough syrup as needed- may cause drowsiness Mucinex DM to thin your congestion and suppress cough w/o drowsiness REST! Call with any questions or concerns Hang in there!

## 2019-02-10 ENCOUNTER — Ambulatory Visit: Payer: 59 | Admitting: Family Medicine

## 2019-02-13 ENCOUNTER — Ambulatory Visit (INDEPENDENT_AMBULATORY_CARE_PROVIDER_SITE_OTHER)
Admission: RE | Admit: 2019-02-13 | Discharge: 2019-02-13 | Disposition: A | Discharge status: Home / Self Care | Payer: 59 | Source: Ambulatory Visit | Attending: Family Medicine | Admitting: Family Medicine

## 2019-02-13 ENCOUNTER — Encounter: Payer: Self-pay | Admitting: Family Medicine

## 2019-02-13 ENCOUNTER — Other Ambulatory Visit: Payer: Self-pay

## 2019-02-13 ENCOUNTER — Ambulatory Visit (INDEPENDENT_AMBULATORY_CARE_PROVIDER_SITE_OTHER): Payer: 59 | Admitting: Family Medicine

## 2019-02-13 VITALS — BP 122/80 | HR 72 | Temp 98.2°F | Resp 16 | Ht 66.0 in | Wt 169.5 lb

## 2019-02-13 DIAGNOSIS — M25472 Effusion, left ankle: Secondary | ICD-10-CM | POA: Diagnosis not present

## 2019-02-13 DIAGNOSIS — R053 Chronic cough: Secondary | ICD-10-CM

## 2019-02-13 DIAGNOSIS — E7800 Pure hypercholesterolemia, unspecified: Secondary | ICD-10-CM | POA: Diagnosis not present

## 2019-02-13 DIAGNOSIS — R05 Cough: Secondary | ICD-10-CM

## 2019-02-13 DIAGNOSIS — I10 Essential (primary) hypertension: Secondary | ICD-10-CM

## 2019-02-13 DIAGNOSIS — R0602 Shortness of breath: Secondary | ICD-10-CM | POA: Diagnosis not present

## 2019-02-13 LAB — TSH: TSH: 1.56 u[IU]/mL (ref 0.35–4.50)

## 2019-02-13 LAB — BASIC METABOLIC PANEL
BUN: 11 mg/dL (ref 6–23)
CO2: 25 mEq/L (ref 19–32)
Calcium: 9.7 mg/dL (ref 8.4–10.5)
Chloride: 103 mEq/L (ref 96–112)
Creatinine, Ser: 0.81 mg/dL (ref 0.40–1.20)
GFR: 71.08 mL/min (ref >60.00)
Glucose, Bld: 100 mg/dL — ABNORMAL HIGH (ref 70–99)
Potassium: 4.5 mEq/L (ref 3.5–5.1)
Sodium: 137 mEq/L (ref 135–145)

## 2019-02-13 LAB — HEPATIC FUNCTION PANEL
ALT: 19 U/L (ref 0–35)
AST: 20 U/L (ref 0–37)
Albumin: 4.2 g/dL (ref 3.5–5.2)
Alkaline Phosphatase: 105 U/L (ref 39–117)
Bilirubin, Direct: 0.1 mg/dL (ref 0.0–0.3)
Total Bilirubin: 0.5 mg/dL (ref 0.2–1.2)
Total Protein: 7.3 g/dL (ref 6.0–8.3)

## 2019-02-13 LAB — CBC WITH DIFFERENTIAL/PLATELET
Basophils Absolute: 0 10*3/uL (ref 0.0–0.1)
Basophils Relative: 0.3 % (ref 0.0–3.0)
Eosinophils Absolute: 0.1 10*3/uL (ref 0.0–0.7)
Eosinophils Relative: 0.9 % (ref 0.0–5.0)
HCT: 37.4 % (ref 36.0–46.0)
Hemoglobin: 12.7 g/dL (ref 12.0–15.0)
Lymphocytes Relative: 31.9 % (ref 12.0–46.0)
Lymphs Abs: 2.4 10*3/uL (ref 0.7–4.0)
MCHC: 34 g/dL (ref 30.0–36.0)
MCV: 89.9 fl (ref 78.0–100.0)
Monocytes Absolute: 0.7 10*3/uL (ref 0.1–1.0)
Monocytes Relative: 8.7 % (ref 3.0–12.0)
Neutro Abs: 4.3 10*3/uL (ref 1.4–7.7)
Neutrophils Relative %: 58.2 % (ref 43.0–77.0)
Platelets: 404 10*3/uL — ABNORMAL HIGH (ref 150.0–400.0)
RBC: 4.16 Mil/uL (ref 3.87–5.11)
RDW: 14.2 % (ref 11.5–15.5)
WBC: 7.4 10*3/uL (ref 4.0–10.5)

## 2019-02-13 LAB — LIPID PANEL
Cholesterol: 153 mg/dL (ref 0–200)
HDL: 37.2 mg/dL — ABNORMAL LOW (ref >39.00)
NonHDL: 116.16
Total CHOL/HDL Ratio: 4
Triglycerides: 282 mg/dL — ABNORMAL HIGH (ref 0.0–149.0)
VLDL: 56.4 mg/dL — ABNORMAL HIGH (ref 0.0–40.0)

## 2019-02-13 LAB — LDL CHOLESTEROL, DIRECT: Direct LDL: 69 mg/dL

## 2019-02-13 MED ORDER — GUAIFENESIN-CODEINE 100-10 MG/5ML PO SYRP: 10.0000 mL | ORAL_SOLUTION | Freq: Three times a day (TID) | ORAL | 0 refills | Status: DC | PRN

## 2019-02-13 NOTE — Progress Notes (Signed)
   Subjective:    Patient ID: Vicki [name], female    DOB: 05/10/1956, 63 y.o.   MRN: [id number]  HPI HTN- chronic problem, on Metoprolol 50mg  BID, Lisinopril 10mg  daily.  Denies CP, SOB- w/ exception of recent illness.  Hyperlipidemia- chronic problem, on Crestor 10mg  daily.  Denies abd pain, N/V.  Edema- L ankle, predominantly lateral side.  sxs started ~2 weeks ago.  No R sided swelling.  No known injury.  Ankle is painful.  No swelling of lower leg or calf.    URI- is almost out of cough medication.  Continues on Doxycycline.  Is starting to feel better.  Sore in ribs and back from coughing.  Currently afebrile.  Chills and aches have improved.   Review of Systems For ROS see HPI     Objective:   Physical Exam Vitals signs reviewed.  Constitutional:      General: She is not in acute distress.    Appearance: Normal appearance. She is well-developed. She is not ill-appearing.  HENT:     Head: Normocephalic and atraumatic.  Eyes:     Conjunctiva/sclera: Conjunctivae normal.     Pupils: Pupils are equal, round, and reactive to light.  Neck:     Musculoskeletal: Normal range of motion and neck supple.     Thyroid: No thyromegaly.  Cardiovascular:     Rate and Rhythm: Normal rate and regular rhythm.     Heart sounds: Normal heart sounds. No murmur.  Pulmonary:     Effort: Pulmonary effort is normal. No respiratory distress.     Breath sounds: Normal breath sounds. No wheezing or rales.     Comments: Wet, hacking cough Abdominal:     General: There is no distension.     Palpations: Abdomen is soft.     Tenderness: There is no abdominal tenderness.  Musculoskeletal:        General: Swelling (L lateral ankle swelling) present. No tenderness (no TTP over L lateral or medial malleolus) or deformity.  Lymphadenopathy:     Cervical: No cervical adenopathy.  Skin:    General: Skin is warm and dry.  Neurological:     Mental Status: She is alert and oriented to person, place, and  time.  Psychiatric:        Behavior: Behavior normal.           Assessment & Plan:  Persistent cough- lungs sound better today but cough remains wet and hacking.  Doing better on Doxycycline.  Get CXR to assess and determine if additional work up or tx is needed.  Pt expressed understanding and is in agreement w/ plan.   L ankle swelling- new.  Location suggests mild inversion sprain.  Pt doesn't recall an injury but says it's possible.  Does not meet [place] rules for imaging.  Ankle brace given.  If no improvement will refer to sports med.  Pt expressed understanding and is in agreement w/ plan.

## 2019-02-13 NOTE — Patient Instructions (Signed)
Go to [facility] for your chest xray Schedule your complete physical in 6 months We'll notify you of your lab results and make any changes if needed Wear the ankle brace and good supportive shoes ICE the ankle Use the cough syrup as needed (refill sent!) Call with any questions or concerns Hang in there!

## 2019-02-14 ENCOUNTER — Telehealth: Payer: Self-pay | Admitting: General Practice

## 2019-02-14 ENCOUNTER — Encounter: Payer: Self-pay | Admitting: General Practice

## 2019-02-14 NOTE — Telephone Encounter (Signed)
Pt made aware that letter is available to be printed from [facility]. Letter also signed and placed in my purple folder in case needed.

## 2019-02-14 NOTE — Telephone Encounter (Signed)
Called pt and advised of lab results. Per Pt she spoke with HR they advised that they needed a letter form Dr. 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    Please advise on letter content. Also gave pt ok to have shot term disability papers faxed to office.

## 2019-02-14 NOTE — Telephone Encounter (Signed)
Pt can have a letter indicating that she should be out of work due to illness from date of first visit until 3/23- at which point she should work from home if able

## 2019-02-18 ENCOUNTER — Other Ambulatory Visit: Payer: Self-pay | Admitting: Cardiovascular Disease

## 2019-02-21 ENCOUNTER — Telehealth: Payer: Self-pay | Admitting: Family Medicine

## 2019-02-21 DIAGNOSIS — Z0279 Encounter for issue of other medical certificate: Secondary | ICD-10-CM

## 2019-02-21 NOTE — Telephone Encounter (Signed)
I have placed forms from [employer] in the bin with a charge sheet.

## 2019-02-21 NOTE — Telephone Encounter (Signed)
Paperwork completed the best I can and given to PCP.

## 2019-02-22 NOTE — Telephone Encounter (Signed)
Forms have been faxed and placed up front to be scanned.

## 2019-02-22 NOTE — Telephone Encounter (Signed)
Form completed and placed in basket  

## 2019-03-01 NOTE — Assessment & Plan Note (Signed)
Chronic problem.  Hx of good control.  Asymptomatic w/ exception of SOB w/ recent illness.  Check labs.  No anticipated med changes.

## 2019-03-01 NOTE — Assessment & Plan Note (Signed)
Chronic problem, on Crestor 10mg  daily.  Check labs and adjust meds prn.

## 2019-03-13 ENCOUNTER — Other Ambulatory Visit: Payer: Self-pay | Admitting: Family Medicine

## 2019-04-04 DIAGNOSIS — K52831 Collagenous colitis: Secondary | ICD-10-CM | POA: Diagnosis not present

## 2019-05-09 ENCOUNTER — Telehealth: Payer: Self-pay | Admitting: *Deleted

## 2019-05-09 NOTE — Telephone Encounter (Signed)
A message left,re: follow up visit. 

## 2019-05-22 ENCOUNTER — Other Ambulatory Visit: Payer: Self-pay | Admitting: Family Medicine

## 2019-05-22 NOTE — Telephone Encounter (Signed)
Last OV 02/13/19 Alprazolam last filled 01/04/19 #30 with 3

## 2019-06-03 ENCOUNTER — Other Ambulatory Visit: Payer: Self-pay | Admitting: Cardiovascular Disease

## 2019-06-05 ENCOUNTER — Other Ambulatory Visit: Payer: Self-pay | Admitting: Cardiovascular Disease

## 2019-06-05 NOTE — Telephone Encounter (Signed)
Rx(s) sent to pharmacy electronically.  

## 2019-06-09 ENCOUNTER — Other Ambulatory Visit: Payer: Self-pay | Admitting: Family Medicine

## 2019-06-17 ENCOUNTER — Other Ambulatory Visit: Payer: Self-pay | Admitting: Cardiovascular Disease

## 2019-07-08 ENCOUNTER — Other Ambulatory Visit: Payer: Self-pay | Admitting: Cardiovascular Disease

## 2019-07-11 ENCOUNTER — Telehealth: Payer: Self-pay | Admitting: Cardiovascular Disease

## 2019-07-11 NOTE — Telephone Encounter (Signed)
LVM for patient to schedule 1 year followup with Dr. Gwenlyn Found.

## 2019-07-11 NOTE — Telephone Encounter (Signed)
Pt overdue for 12 month f/u. Please contact pt for future appointment. Pt needing refills. 

## 2019-07-12 ENCOUNTER — Other Ambulatory Visit: Payer: Self-pay | Admitting: Cardiovascular Disease

## 2019-07-12 NOTE — Telephone Encounter (Signed)
Rx(s) sent to pharmacy electronically.  

## 2019-08-09 ENCOUNTER — Telehealth: Payer: Self-pay | Admitting: *Deleted

## 2019-08-09 NOTE — Telephone Encounter (Signed)
A message was left.re:follow up visit. 

## 2019-08-16 ENCOUNTER — Encounter: Payer: 59 | Admitting: Family Medicine

## 2019-09-04 ENCOUNTER — Ambulatory Visit: Payer: 59 | Admitting: Physician Assistant

## 2019-09-04 ENCOUNTER — Encounter: Payer: Self-pay | Admitting: Family Medicine

## 2019-09-04 ENCOUNTER — Other Ambulatory Visit: Payer: Self-pay

## 2019-09-04 ENCOUNTER — Ambulatory Visit (INDEPENDENT_AMBULATORY_CARE_PROVIDER_SITE_OTHER): Payer: 59 | Admitting: Family Medicine

## 2019-09-04 DIAGNOSIS — B9689 Other specified bacterial agents as the cause of diseases classified elsewhere: Secondary | ICD-10-CM

## 2019-09-04 DIAGNOSIS — J329 Chronic sinusitis, unspecified: Secondary | ICD-10-CM

## 2019-09-04 DIAGNOSIS — L989 Disorder of the skin and subcutaneous tissue, unspecified: Secondary | ICD-10-CM

## 2019-09-04 MED ORDER — GUAIFENESIN-CODEINE 100-10 MG/5ML PO SYRP: 10.0000 mL | ORAL_SOLUTION | Freq: Three times a day (TID) | ORAL | 0 refills | Status: DC | PRN

## 2019-09-04 MED ORDER — AMOXICILLIN 875 MG PO TABS: 875.0000 mg | ORAL_TABLET | Freq: Two times a day (BID) | ORAL | 0 refills | Status: DC

## 2019-09-04 NOTE — Progress Notes (Signed)
I have discussed the procedure for the virtual visit with the patient who has given consent to proceed with assessment and treatment.   Pt unable to obtain vitals.   Declynn Lopresti L Laraina Sulton, CMA     

## 2019-09-04 NOTE — Progress Notes (Signed)
Virtual Visit via Video   I connected with patient on 09/04/19 at 11:00 AM EDT by a video enabled telemedicine application and verified that I am speaking with the correct person using two identifiers.  Location patient: Home Location provider: [facility], Office Persons participating in the virtual visit: Patient, Provider, [place] (Jess B)  I discussed the limitations of evaluation and management by telemedicine and the availability of in person appointments. The patient expressed understanding and agreed to proceed.  Subjective:   HPI:   URI- watched grandson on Wednesday who had URI.  Friday developed sore throat.  Now w/ deep cough- productive.  No fevers.  + sinus pain/pressure, nasal congestion, PND which causes sore throat.  Had ear pain yesterday, none today.  Denies changes to taste/smell.  + body aches.  No SOB.  Denies tooth pain.  No known sick contacts other than grandson.  'I feel like this is so much like i've had'.  Skin lesions- pt reports well circumscribed lesions 'all over' that will scab and then the scab peels off 'looking like a burn' underneath.  Pt currently w/ 6-8 lesions.  Pt reports burning and itching once the scab peels off.  No lesions on trunk, only arms and legs.  First lesion appeared mid-July.  ROS:   See pertinent positives and negatives per HPI.  Patient Active Problem List   Diagnosis Date Noted  . Foot pain, right 11/25/2018  . Collagenous colitis 02/08/2018  . Family history of Alzheimer's disease 02/08/2018  . Psoriasis 02/12/2015  . 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 Bradycardia, symptomatic with fatigue 09/13/2013  . Low back pain 02/23/2013  . CAD (coronary atherosclerotic disease), STEMI 2011 with emergent BMS to RCA, residual 60 % LAD disease.  Associated V fib arrest. 05/30/2012  . Physical exam 05/04/2012  . Allergic rhinitis, seasonal 03/25/2012  . Hyperlipidemia 06/19/2010  . Essential hypertension  06/19/2010  . MYOCARDIAL INFARCTION, HX OF 06/19/2010    Social History   Tobacco Use  . Smoking status: Former Smoker    Quit date: 01/28/2010    Years since quitting: 9.6  . Smokeless tobacco: Never Used  Substance Use Topics  . Alcohol use: Yes    Alcohol/week: 1.0 - 2.0 standard drinks    Types: 1 - 2 Glasses of wine per week    Current Outpatient Medications:  .  ALPRAZolam (XANAX) 0.25 MG tablet, TAKE 1 TABLET BY MOUTH AT BEDTIME AS NEEDED, Disp: 30 tablet, Rfl: 3 .  aspirin 81 MG tablet, Take 81 mg by mouth daily. , Disp: , Rfl:  .  budesonide (ENTOCORT EC) 3 MG 24 hr capsule, TK 3 CS PO QD, Disp: , Rfl: 5 .  cetirizine (ZYRTEC) 5 MG tablet, Take 5 mg by mouth daily.  , Disp: , Rfl:  .  Cholecalciferol (VITAMIN D3) 5000 units CAPS, Take by mouth., Disp: , Rfl:  .  clopidogrel (PLAVIX) 75 MG tablet, Take 1 tablet (75 mg total) by mouth daily. NEEDS APPOINTMENT FOR FUTURE REFILLS, Disp: 90 tablet, Rfl: 0 .  COD LIVER OIL PO, Take 1 tablet by mouth daily., Disp: , Rfl:  .  Cyanocobalamin (VITAMIN B 12 PO), Take 500 mg by mouth daily., Disp: , Rfl:  .  diclofenac sodium (VOLTAREN) 1 % GEL, Apply 2 g topically 4 (four) times daily., Disp: 100 g, Rfl: 0 .  escitalopram (LEXAPRO) 10 MG tablet, TAKE 1 TABLET BY MOUTH EVERY DAY, Disp: 90 tablet, Rfl: 0 .  lisinopril (PRINIVIL,ZESTRIL) 10 MG tablet, TAKE 1 TABLET BY MOUTH DAILY, Disp: 90 tablet, Rfl: 3 .  metoprolol tartrate (LOPRESSOR) 50 MG tablet, TAKE 1 TABLET(50 MG) BY MOUTH TWICE DAILY, Disp: 180 tablet, Rfl: 1 .  montelukast (SINGULAIR) 10 MG tablet, TAKE 1 TABLET(10 MG) BY MOUTH AT BEDTIME, Disp: 30 tablet, Rfl: 6 .  nitroGLYCERIN (NITROSTAT) 0.4 MG SL tablet, PLACE 1 TABLET UNDER THE TONGUE EVERY 5 MINUTES AS NEEDED FOR CHEST PAIN, Disp: 25 tablet, Rfl: 1 .  pantoprazole (PROTONIX) 40 MG tablet, Take 1 tablet (40 mg total) by mouth 2 (two) times daily., Disp: 180 tablet, Rfl: 3 .  potassium chloride SA (K-DUR) 20 MEQ tablet, TAKE  1 TABLET(20 MEQ) BY MOUTH DAILY, Disp: 90 tablet, Rfl: 1 .  rosuvastatin (CRESTOR) 10 MG tablet, Take 1 tablet (10 mg total) by mouth daily. NEEDS APPOINTMENT FOR FUTURE REFILLS, Disp: 90 tablet, Rfl: 0 .  Albuterol Sulfate (PROAIR RESPICLICK) 123XX123 (90 Base) MCG/ACT AEPB, Inhale 2 puffs into the lungs every 4 (four) hours as needed. (Patient not taking: Reported on 09/04/2019), Disp: 1 each, Rfl: 3  No Known Allergies  Objective:   There were no vitals taken for this visit. AAOx3, NAD NCAT, EOMI No obvious CN deficits Coloring WNL, not ill appearing Pt is able to speak clearly, coherently without shortness of breath or increased work of breathing. Deep, hacking cough  Thought process is linear.  Mood is appropriate.  Scattered, well circumscribed lesions of arms and legs, some w/ overlying scab, some unroofed w/ raw appearing skin underneath  Assessment and Plan:   Bacterial sinusitis- pt has hx of similar and feels this is consistent w/ previous infections.  She feels quite certain she doesn't have COVID and is not interested in testing at this time.  Prefers to first start abx and if no improvement will then consider testing.  Start Amox.  Cough meds.  Encouraged her to remain home (pt currently at work) and notify me immediately if sxs change.  Pt expressed understanding and is in agreement w/ plan.   Skin lesions- new.  Pt reports sxs since mid-July on arms and legs.  Areas will start as painless bumps, develop a scab which then peels off, leaving the remaining area 'like a burn'.  Currently has 6-8 lesions in various stages.  She was going to call Derm but then developed respiratory sxs.  Encouraged her to call and schedule for when she is feeling better and would like to do autoimmune labs here once her respiratory sxs have resolved.  Pt expressed understanding and is in agreement w/ plan.    Annye Asa, MD 09/04/2019

## 2019-09-24 ENCOUNTER — Other Ambulatory Visit: Payer: Self-pay | Admitting: Cardiovascular Disease

## 2019-09-26 ENCOUNTER — Other Ambulatory Visit: Payer: Self-pay | Admitting: General Practice

## 2019-09-26 MED ORDER — ESCITALOPRAM OXALATE 10 MG PO TABS: 10.0000 mg | ORAL_TABLET | Freq: Every day | ORAL | 0 refills | Status: DC

## 2019-10-07 ENCOUNTER — Ambulatory Visit (INDEPENDENT_AMBULATORY_CARE_PROVIDER_SITE_OTHER): Payer: 59 | Admitting: Family Medicine

## 2019-10-07 ENCOUNTER — Other Ambulatory Visit: Payer: Self-pay

## 2019-10-07 ENCOUNTER — Encounter: Payer: Self-pay | Admitting: Family Medicine

## 2019-10-07 DIAGNOSIS — J40 Bronchitis, not specified as acute or chronic: Secondary | ICD-10-CM

## 2019-10-07 MED ORDER — GUAIFENESIN-CODEINE 100-10 MG/5ML PO SYRP: 10.0000 mL | ORAL_SOLUTION | Freq: Three times a day (TID) | ORAL | 0 refills | Status: DC | PRN

## 2019-10-07 MED ORDER — AMOXICILLIN 875 MG PO TABS: 875.0000 mg | ORAL_TABLET | Freq: Two times a day (BID) | ORAL | 0 refills | Status: DC

## 2019-10-07 MED ORDER — ALBUTEROL SULFATE HFA 108 (90 BASE) MCG/ACT IN AERS: 2.0000 | INHALATION_SPRAY | Freq: Four times a day (QID) | RESPIRATORY_TRACT | 3 refills | Status: DC | PRN

## 2019-10-07 NOTE — Progress Notes (Signed)
Virtual Visit via Video   I connected with patient on 10/07/19 at 12:00 PM EST by a video enabled telemedicine application and verified that I am speaking with the correct person using two identifiers.  Location patient: Home Location provider: Acupuncturist, Office Persons participating in the virtual visit: Patient, Provider, [place] (Jess B)  I discussed the limitations of evaluation and management by telemedicine and the availability of in person appointments. The patient expressed understanding and agreed to proceed.  Subjective:   HPI:   URI- was w/ daughter last Saturday who developed sore throat and congestion on Sunday.  Pt's sxs started Monday.  Has been using leftover cough medicine and inhaler but last night and this AM sxs are worse.  No fevers.  No changes to taste or smell.  No HA.  + back ache from severe cough.  Cough is harsh.  No known exposures.  Pt has been wearing masks and going very few places other than work.  + clear/yellow nasal drainage.  Denies SOB at rest.  When coughing she will have SOB.  No chest tightness.  + wheezing.  Pt reports wheezing seems to be upper airway and not in chest.  Is out of Albuterol.  Pt reports Amox given early October helped and worked quickly.  ROS:   See pertinent positives and negatives per HPI.  Patient Active Problem List   Diagnosis Date Noted  . Foot pain, right 11/25/2018  . Collagenous colitis 02/08/2018  . Family history of Alzheimer's disease 02/08/2018  . Psoriasis 02/12/2015  . 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 Bradycardia, symptomatic with fatigue 09/13/2013  . Low back pain 02/23/2013  . CAD (coronary atherosclerotic disease), STEMI 2011 with emergent BMS to RCA, residual 60 % LAD disease.  Associated V fib arrest. 05/30/2012  . Physical exam 05/04/2012  . Allergic rhinitis, seasonal 03/25/2012  . Hyperlipidemia 06/19/2010  . Essential hypertension 06/19/2010  . MYOCARDIAL INFARCTION,  HX OF 06/19/2010    Social History   Tobacco Use  . Smoking status: Former Smoker    Quit date: 01/28/2010    Years since quitting: 9.6  . Smokeless tobacco: Never Used  Substance Use Topics  . Alcohol use: Yes    Alcohol/week: 1.0 - 2.0 standard drinks    Types: 1 - 2 Glasses of wine per week    Current Outpatient Medications:  .  Albuterol Sulfate (PROAIR RESPICLICK) [id number] (90 Base) MCG/ACT AEPB, Inhale 2 puffs into the lungs every 4 (four) hours as needed., Disp: 1 each, Rfl: 3 .  ALPRAZolam (XANAX) 0.25 MG tablet, TAKE 1 TABLET BY MOUTH AT BEDTIME AS NEEDED, Disp: 30 tablet, Rfl: 3 .  aspirin 81 MG tablet, Take 81 mg by mouth daily. , Disp: , Rfl:  .  budesonide (ENTOCORT EC) 3 MG 24 hr capsule, TK 3 CS PO QD, Disp: , Rfl: 5 .  cetirizine (ZYRTEC) 5 MG tablet, Take 5 mg by mouth daily.  , Disp: , Rfl:  .  Cholecalciferol (VITAMIN D3) 5000 units CAPS, Take by mouth., Disp: , Rfl:  .  clopidogrel (PLAVIX) 75 MG tablet, TAKE 1 TABLET(75 MG) BY MOUTH DAILY, Disp: 90 tablet, Rfl: 0 .  COD LIVER OIL PO, Take 1 tablet by mouth daily., Disp: , Rfl:  .  Cyanocobalamin (VITAMIN B 12 PO), Take 500 mg by mouth daily., Disp: , Rfl:  .  diclofenac sodium (VOLTAREN) 1 % GEL, Apply 2 g topically 4 (four) times daily., Disp: 100 g,  Rfl: 0 .  escitalopram (LEXAPRO) 10 MG tablet, Take 1 tablet (10 mg total) by mouth daily., Disp: 90 tablet, Rfl: 0 .  lisinopril (PRINIVIL,ZESTRIL) 10 MG tablet, TAKE 1 TABLET BY MOUTH DAILY, Disp: 90 tablet, Rfl: 3 .  metoprolol tartrate (LOPRESSOR) 50 MG tablet, TAKE 1 TABLET(50 MG) BY MOUTH TWICE DAILY, Disp: 180 tablet, Rfl: 1 .  montelukast (SINGULAIR) 10 MG tablet, TAKE 1 TABLET(10 MG) BY MOUTH AT BEDTIME, Disp: 30 tablet, Rfl: 6 .  nitroGLYCERIN (NITROSTAT) 0.4 MG SL tablet, PLACE 1 TABLET UNDER THE TONGUE EVERY 5 MINUTES AS NEEDED FOR CHEST PAIN, Disp: 25 tablet, Rfl: 1 .  pantoprazole (PROTONIX) 40 MG tablet, Take 1 tablet (40 mg total) by mouth 2 (two) times  daily., Disp: 180 tablet, Rfl: 3 .  potassium chloride SA (K-DUR) 20 MEQ tablet, TAKE 1 TABLET(20 MEQ) BY MOUTH DAILY, Disp: 90 tablet, Rfl: 1 .  rosuvastatin (CRESTOR) 10 MG tablet, TAKE 1 TABLET(10 MG) BY MOUTH DAILY, Disp: 90 tablet, Rfl: 0 .  guaiFENesin-codeine (ROBITUSSIN AC) 100-10 MG/5ML syrup, Take 10 mLs by mouth 3 (three) times daily as needed for cough. (Patient not taking: Reported on 10/07/2019), Disp: 120 mL, Rfl: 0  No Known Allergies  Objective:   There were no vitals taken for this visit. AAOx3, NAD NCAT, EOMI No obvious CN deficits Coloring WNL Pt is able to speak clearly, coherently without shortness of breath or increased work of breathing.  Harsh, hacking cough Thought process is linear.  Mood is appropriate.   Assessment and Plan:   Acute bronchitis- pt has hx of this.  Will start abx, inhaler, cough syrup.  Discussed that if sxs don't improve, will need COVID testing.  Pt is agreeable to this but lacks all other COVID sxs w/ exception of cough.  Reviewed supportive care and red flags that should prompt return.  Pt expressed understanding and is in agreement w/ plan.    Annye Asa, MD 10/07/2019

## 2019-10-07 NOTE — Progress Notes (Signed)
I have discussed the procedure for the virtual visit with the patient who has given consent to proceed with assessment and treatment.   Pt unable to obtain vitals.   Jessica L Brodmerkel, CMA     

## 2019-10-20 ENCOUNTER — Encounter: Payer: Self-pay | Admitting: Family Medicine

## 2019-10-20 ENCOUNTER — Ambulatory Visit (INDEPENDENT_AMBULATORY_CARE_PROVIDER_SITE_OTHER): Payer: 59 | Admitting: Family Medicine

## 2019-10-20 ENCOUNTER — Telehealth: Payer: Self-pay | Admitting: Cardiovascular Disease

## 2019-10-20 ENCOUNTER — Other Ambulatory Visit: Payer: Self-pay

## 2019-10-20 DIAGNOSIS — I10 Essential (primary) hypertension: Secondary | ICD-10-CM | POA: Diagnosis not present

## 2019-10-20 DIAGNOSIS — Z0000 Encounter for general adult medical examination without abnormal findings: Secondary | ICD-10-CM | POA: Diagnosis not present

## 2019-10-20 DIAGNOSIS — J411 Mucopurulent chronic bronchitis: Secondary | ICD-10-CM

## 2019-10-20 NOTE — Progress Notes (Signed)
Virtual Visit via Video   I connected with patient on 10/20/19 at 10:00 AM EST by a video enabled telemedicine application and verified that I am speaking with the correct person using two identifiers.  Location patient: Home Location provider: Acupuncturist, Office Persons participating in the virtual visit: Patient, Provider, [place] (Jess B)  I discussed the limitations of evaluation and management by telemedicine and the availability of in person appointments. The patient expressed understanding and agreed to proceed.  Subjective:   HPI:   CPE- UTD on pap, colonoscopy, Tdap.  Due for flu shot, mammogram (pt plans to schedule).  ROS:  Patient reports no vision/ hearing changes, adenopathy,fever, weight change,  persistant/recurrent hoarseness , swallowing issues, chest pain, palpitations, edema, persistant/recurrent cough, hemoptysis, dyspnea (rest/exertional/paroxysmal nocturnal), gastrointestinal bleeding (melena, rectal bleeding), abdominal pain, significant heartburn, bowel changes, GU symptoms (dysuria, hematuria, incontinence), Gyn symptoms (abnormal  bleeding, pain),  syncope, focal weakness, memory loss, numbness & tingling, skin/hair/nail changes, abnormal bruising or bleeding, anxiety, or depression.   Patient Active Problem List   Diagnosis Date Noted  . Foot pain, right 11/25/2018  . Collagenous colitis 02/08/2018  . Family history of Alzheimer's disease 02/08/2018  . Psoriasis 02/12/2015  . 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 Bradycardia, symptomatic with fatigue 09/13/2013  . Low back pain 02/23/2013  . CAD (coronary atherosclerotic disease), STEMI 2011 with emergent BMS to RCA, residual 60 % LAD disease.  Associated V fib arrest. 05/30/2012  . Physical exam 05/04/2012  . Allergic rhinitis, seasonal 03/25/2012  . Hyperlipidemia 06/19/2010  . Essential hypertension 06/19/2010  . MYOCARDIAL INFARCTION, HX OF 06/19/2010    Social History    Tobacco Use  . Smoking status: Former Smoker    Quit date: 01/28/2010    Years since quitting: 9.7  . Smokeless tobacco: Never Used  Substance Use Topics  . Alcohol use: Yes    Alcohol/week: 1.0 - 2.0 standard drinks    Types: 1 - 2 Glasses of wine per week    Current Outpatient Medications:  .  albuterol (VENTOLIN HFA) 108 (90 Base) MCG/ACT inhaler, Inhale 2 puffs into the lungs every 6 (six) hours as needed for wheezing or shortness of breath., Disp: 18 g, Rfl: 3 .  ALPRAZolam (XANAX) 0.25 MG tablet, TAKE 1 TABLET BY MOUTH AT BEDTIME AS NEEDED, Disp: 30 tablet, Rfl: 3 .  aspirin 81 MG tablet, Take 81 mg by mouth daily. , Disp: , Rfl:  .  budesonide (ENTOCORT EC) 3 MG 24 hr capsule, TK 3 CS PO QD, Disp: , Rfl: 5 .  cetirizine (ZYRTEC) 5 MG tablet, Take 5 mg by mouth daily.  , Disp: , Rfl:  .  Cholecalciferol (VITAMIN D3) 5000 units CAPS, Take by mouth., Disp: , Rfl:  .  clopidogrel (PLAVIX) 75 MG tablet, TAKE 1 TABLET(75 MG) BY MOUTH DAILY, Disp: 90 tablet, Rfl: 0 .  COD LIVER OIL PO, Take 1 tablet by mouth daily., Disp: , Rfl:  .  Cyanocobalamin (VITAMIN B 12 PO), Take 500 mg by mouth daily., Disp: , Rfl:  .  diclofenac sodium (VOLTAREN) 1 % GEL, Apply 2 g topically 4 (four) times daily., Disp: 100 g, Rfl: 0 .  escitalopram (LEXAPRO) 10 MG tablet, Take 1 tablet (10 mg total) by mouth daily., Disp: 90 tablet, Rfl: 0 .  lisinopril (PRINIVIL,ZESTRIL) 10 MG tablet, TAKE 1 TABLET BY MOUTH DAILY, Disp: 90 tablet, Rfl: 3 .  metoprolol tartrate (LOPRESSOR) 50 MG tablet, TAKE 1 TABLET(50 MG)  BY MOUTH TWICE DAILY, Disp: 180 tablet, Rfl: 1 .  montelukast (SINGULAIR) 10 MG tablet, TAKE 1 TABLET(10 MG) BY MOUTH AT BEDTIME, Disp: 30 tablet, Rfl: 6 .  nitroGLYCERIN (NITROSTAT) 0.4 MG SL tablet, PLACE 1 TABLET UNDER THE TONGUE EVERY 5 MINUTES AS NEEDED FOR CHEST PAIN, Disp: 25 tablet, Rfl: 1 .  pantoprazole (PROTONIX) 40 MG tablet, Take 1 tablet (40 mg total) by mouth 2 (two) times daily., Disp: 180  tablet, Rfl: 3 .  potassium chloride SA (K-DUR) 20 MEQ tablet, TAKE 1 TABLET(20 MEQ) BY MOUTH DAILY, Disp: 90 tablet, Rfl: 1 .  rosuvastatin (CRESTOR) 10 MG tablet, TAKE 1 TABLET(10 MG) BY MOUTH DAILY, Disp: 90 tablet, Rfl: 0 .  guaiFENesin-codeine (ROBITUSSIN AC) 100-10 MG/5ML syrup, Take 10 mLs by mouth 3 (three) times daily as needed for cough. (Patient not taking: Reported on 10/20/2019), Disp: 120 mL, Rfl: 0  No Known Allergies  Objective:   There were no vitals taken for this visit. AAOx3, NAD NCAT, EOMI No obvious CN deficits Coloring WNL Pt is able to speak clearly, coherently without shortness of breath or increased work of breathing.  Thought process is linear.  Mood is appropriate.   Assessment and Plan:   CPE- UTD on pap, colonoscopy, Tdap.  Due for mammo (pt to schedule) and flu (will get when she returns for labs).  Check labs.  Anticipatory guidance provided.    Annye Asa, MD 10/20/2019

## 2019-10-20 NOTE — Addendum Note (Signed)
Addended by: Davis Gourd on: 10/20/2019 10:45 AM   Modules accepted: Orders

## 2019-10-20 NOTE — Telephone Encounter (Signed)
Would resched to Jan

## 2019-10-20 NOTE — Telephone Encounter (Signed)
New Message     Pt says she still have pneumonia, not as bad as she was. Have taken 10 days of antiobiotics. Her question is should she still come in for her appt on Monday with Dr Gwenlyn Found? She have not been able to have lab work, because of being sick with pneumonia.her r

## 2019-10-20 NOTE — Telephone Encounter (Addendum)
Spoke with pt who state she had upcoming appt with PCP office and they advised her not to come in d/t pneumonia. She wants to know if Dr. Gwenlyn Found would advise the same. Pt okay with rescheduling appt to 12/8 at 8:30 am. She states she will have lab work done at PCP office by the time of appt.  Pt would like triage nurse to let Dr. Gwenlyn Found know that she is sending him a virtual hug and her love

## 2019-10-20 NOTE — Progress Notes (Signed)
I have discussed the procedure for the virtual visit with the patient who has given consent to proceed with assessment and treatment.   Pt is still working Theatre manager. Unable to obtain vitals.   Davis Gourd, CMA

## 2019-10-23 ENCOUNTER — Ambulatory Visit: Payer: 59 | Admitting: Cardiovascular Disease

## 2019-10-23 NOTE — Telephone Encounter (Signed)
Left patient a message to call office and reschedule appt to January

## 2019-10-25 NOTE — Telephone Encounter (Signed)
Follow up   Patient wants appointment before year end deductible.  Appointment made for 12/30

## 2019-10-31 ENCOUNTER — Other Ambulatory Visit: Payer: Self-pay

## 2019-11-01 ENCOUNTER — Telehealth: Payer: Self-pay | Admitting: Family Medicine

## 2019-11-01 NOTE — Telephone Encounter (Signed)
Lm for pt to call back an schedule  lab and flu shot also to schedule a 32m follow up

## 2019-11-02 ENCOUNTER — Other Ambulatory Visit: Payer: Self-pay

## 2019-11-02 MED ORDER — ALPRAZOLAM 0.25 MG PO TABS: 0.2500 mg | ORAL_TABLET | Freq: Every evening | ORAL | 3 refills | Status: DC | PRN

## 2019-11-02 MED ORDER — MONTELUKAST SODIUM 10 MG PO TABS: ORAL_TABLET | ORAL | 6 refills | Status: DC

## 2019-11-02 NOTE — Telephone Encounter (Signed)
Last refill: 6.22.20 #30, 3 Last OV: 11.20.20 dx. CPE

## 2019-11-07 ENCOUNTER — Ambulatory Visit: Payer: 59 | Admitting: Cardiovascular Disease

## 2019-11-16 ENCOUNTER — Other Ambulatory Visit: Payer: Self-pay

## 2019-11-16 ENCOUNTER — Ambulatory Visit (INDEPENDENT_AMBULATORY_CARE_PROVIDER_SITE_OTHER): Payer: 59

## 2019-11-16 ENCOUNTER — Telehealth: Payer: Self-pay | Admitting: Family Medicine

## 2019-11-16 DIAGNOSIS — L989 Disorder of the skin and subcutaneous tissue, unspecified: Secondary | ICD-10-CM | POA: Diagnosis not present

## 2019-11-16 DIAGNOSIS — Z23 Encounter for immunization: Secondary | ICD-10-CM

## 2019-11-16 DIAGNOSIS — J40 Bronchitis, not specified as acute or chronic: Secondary | ICD-10-CM

## 2019-11-16 LAB — SEDIMENTATION RATE: Sed Rate: 5 mm/hr (ref 0–30)

## 2019-11-16 LAB — C-REACTIVE PROTEIN: CRP: <1 mg/dL (ref 0.5–20.0)

## 2019-11-16 NOTE — Telephone Encounter (Signed)
Please order nebulizer and albuterol nebs for pt to use q6 prn wheezing/cough

## 2019-11-16 NOTE — Telephone Encounter (Signed)
DME order placed and [facility] notified.

## 2019-11-16 NOTE — Telephone Encounter (Signed)
Pt was asking if we could order her a nebulizer, pt states this was talked about at her last visit with Dr. Birdie Riddle. Please advise and pt can be reached at the home #

## 2019-11-16 NOTE — Telephone Encounter (Signed)
Pt made aware

## 2019-11-16 NOTE — Telephone Encounter (Signed)
Please advise 

## 2019-11-17 ENCOUNTER — Other Ambulatory Visit: Payer: Self-pay | Admitting: General Practice

## 2019-11-17 MED ORDER — ALBUTEROL SULFATE (2.5 MG/3ML) 0.083% IN NEBU: 2.5000 mg | INHALATION_SOLUTION | Freq: Four times a day (QID) | RESPIRATORY_TRACT | 0 refills | Status: DC | PRN

## 2019-11-18 LAB — ANA: Anti Nuclear Antibody (ANA): NEGATIVE

## 2019-11-20 ENCOUNTER — Encounter: Payer: Self-pay | Admitting: General Practice

## 2019-11-26 ENCOUNTER — Other Ambulatory Visit: Payer: Self-pay | Admitting: Cardiovascular Disease

## 2019-11-27 NOTE — Progress Notes (Signed)
Cardiology Office Note   Date:  11/29/2019   ID:  Vicki [name], Vicki [name] 1956-04-20, MRN [id number]  PCP:  Midge Minium, MD  Cardiologist:  Quay Burow, MD EP: None  Chief Complaint  Patient presents with  . Follow-up    CAD, HTN      History of Present Illness: Vicki [name] is a 63 y.o. female with a PMH of CAD s/p BMS to RCA in 2011 (presented with inferior STEMI/ VF arrest), HTN, HLD, who presents for routine follow-up.   She was last evaluated by cardiology at an outpatient visit with Dr. Gwenlyn Found 05/2018, at which time she was doing well from a cardiac standpoint. She was without cardiac symptoms and recommended to continue DAPT. She was reported to have residual moderate LAD disease at the time of LHC in 2011. NST in 2018 showed no ischemia; EF 62% at that time.   She returns today for routine follow-up. She has been doing quite well over the past year. 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 She has not been getting regular exercise but stays active watching her 2.5 yo grandchild from time to time. No complaints of chest pain, palpitations, SOB, DOE, LE edema, dizziness, lightheadedness, syncope, orthopnea, or PND. She did have a few bouts of PNA this year and has recovered well from them.    Past Medical History:  Diagnosis Date  . CAD (coronary artery disease) 2011   with stent to RCA and 60 % LAD disease  . Clotting disorder ([place])   . H/O cardiac arrest 02/15/10   with STEMI- Inf wall  . Hyperlipidemia   . Hypertension   . Myocardial infarct ([place]) 02/15/10  . Shock, cardiogenic ([place]) 01/2010   with MI, IABP    Past Surgical History:  Procedure Laterality Date  . ABDOMINAL HYSTERECTOMY  1987   BSO  . APPENDECTOMY  1987  . CORONARY ANGIOPLASTY WITH STENT PLACEMENT  02/15/10   Stent to prox RCA -BMS  . DOPPLER ECHOCARDIOGRAPHY  05/06/2010   EF =50-55%  lvfx low normal ;no sigificant valvular  disease seen  . EXPLORATORY LAPAROTOMY    . HERNIA REPAIR  1980   double bilateral  . MASS EXCISION  09/02/2012   Procedure: EXCISION MASS;  Surgeon: Earnstine Regal, MD;  Location: [facility];  Service: General;  Laterality: Left;  Excise mass Left posterior neck  . NM MYOCAR PERF WALL MOTION  05/06/2010   exercise cap 8 mets. ,mild perfusion defect in basal infer.,mid infer., apical infer., region consistent with infarct/scar. no significant ischemia  . right chest wall exploration       Current Outpatient Medications  Medication Sig Dispense Refill  . albuterol (PROVENTIL) (2.5 MG/3ML) 0.083% nebulizer solution Take 3 mLs (2.5 mg total) by nebulization every 6 (six) hours as needed for wheezing or shortness of breath. 150 mL 0  . albuterol (VENTOLIN HFA) 108 (90 Base) MCG/ACT inhaler Inhale 2 puffs into the lungs every 6 (six) hours as needed for wheezing or shortness of breath. 18 g 3  . ALPRAZolam (XANAX) 0.25 MG tablet Take 1 tablet (0.25 mg total) by mouth at bedtime as needed. 30 tablet 3  . aspirin 81 MG tablet Take 81 mg by mouth daily.     . budesonide (ENTOCORT EC) 3 MG 24 hr capsule TK 3 CS PO QD  5  . cetirizine (ZYRTEC) 5 MG  tablet Take 5 mg by mouth daily.      . Cholecalciferol (VITAMIN D3) 5000 units CAPS Take by mouth.    . clopidogrel (PLAVIX) 75 MG tablet TAKE 1 TABLET(75 MG) BY MOUTH DAILY 90 tablet 0  . COD LIVER OIL PO Take 1 tablet by mouth daily.    . Cyanocobalamin (VITAMIN B 12 PO) Take 500 mg by mouth daily.    . diclofenac sodium (VOLTAREN) 1 % GEL Apply 2 g topically 4 (four) times daily. 100 g 0  . escitalopram (LEXAPRO) 10 MG tablet Take 1 tablet (10 mg total) by mouth daily. 90 tablet 0  . guaiFENesin-codeine (ROBITUSSIN AC) 100-10 MG/5ML syrup Take 10 mLs by mouth 3 (three) times daily as needed for cough. 120 mL 0  . lisinopril (ZESTRIL) 5 MG tablet Take 1 tablet (5 mg total) by mouth daily. 30 tablet 11  . metoprolol tartrate (LOPRESSOR) 50  MG tablet TAKE 1 TABLET(50 MG) BY MOUTH TWICE DAILY 180 tablet 1  . montelukast (SINGULAIR) 10 MG tablet TAKE 1 TABLET(10 MG) BY MOUTH AT BEDTIME 30 tablet 6  . nitroGLYCERIN (NITROSTAT) 0.4 MG SL tablet PLACE 1 TABLET UNDER THE TONGUE EVERY 5 MINUTES AS NEEDED FOR CHEST PAIN 25 tablet 1  . pantoprazole (PROTONIX) 40 MG tablet Take 1 tablet (40 mg total) by mouth 2 (two) times daily. 180 tablet 3  . potassium chloride SA (K-DUR) 20 MEQ tablet TAKE 1 TABLET(20 MEQ) BY MOUTH DAILY 90 tablet 1  . rosuvastatin (CRESTOR) 10 MG tablet TAKE 1 TABLET(10 MG) BY MOUTH DAILY 90 tablet 0   No current facility-administered medications for this visit.    Allergies:   Patient has no known allergies.    Social History:  The patient  reports that she quit smoking about 9 years ago. She has never used smokeless tobacco. She reports current alcohol use of about 1.0 - 2.0 standard drinks of alcohol per week. She reports that she does not use drugs.   Family History:  The patient's family history includes Breast cancer (age of onset: 46) in her mother; Cancer in her father and mother; Healthy in her brother.    ROS:  Please see the history of present illness.   Otherwise, review of systems are positive for none.   All other systems are reviewed and negative.    PHYSICAL EXAM: VS:  BP (!) 147/82   Pulse (!) 53   Temp (!) 97.5 F (36.4 C)   Ht 5\' 6"  (1.676 m)   Wt 167 lb 12.8 oz (76.1 kg)   SpO2 99%   BMI 27.08 kg/m  , BMI Body mass index is 27.08 kg/m. GEN: Well nourished, well developed, in no acute distress HEENT: sclera anicteric Neck: no JVD, carotid bruits, or masses Cardiac: bradycardic, regular rhythm; no murmurs, rubs, or gallops, no edema  Respiratory:  clear to auscultation bilaterally, normal work of breathing GI: soft, nontender, nondistended, + BS MS: no deformity or atrophy Skin: warm and dry, no rash Neuro:  Strength and sensation are intact Psych: euthymic mood, full affect    EKG:  EKG is ordered today. The ekg ordered today demonstrates sinus bradycardia, rate 53bmp, PVC, no STE/D, no TWI.    Recent Labs: 02/13/2019: ALT 19; BUN 11; Creatinine, Ser 0.81; Hemoglobin 12.7; Platelets 404.0; Potassium 4.5; Sodium 137; TSH 1.56    Lipid Panel    Component Value Date/Time   CHOL 153 02/13/2019 1212   TRIG 282.0 (H) 02/13/2019 1212   HDL  37.20 (L) 02/13/2019 1212   CHOLHDL 4 02/13/2019 1212   VLDL 56.4 (H) 02/13/2019 1212   LDLCALC 65 08/11/2017 0948   LDLDIRECT 69.0 02/13/2019 1212      Wt Readings from Last 3 Encounters:  11/29/19 167 lb 12.8 oz (76.1 kg)  02/13/19 169 lb 8 oz (76.9 kg)  02/06/19 169 lb (76.7 kg)      Other studies Reviewed: Additional studies/ records that were reviewed today include:   NST 03/2017:  The left ventricular ejection fraction is normal (55-65%).  Nuclear stress EF: 62%.  There was no ST segment deviation noted during stress.  Defect 1: There is a small defect of moderate severity present in the apex location.  Defect 2: There is a medium defect of moderate severity present in the basal inferoseptal location.  This is a low risk study.   Low risk stress nuclear study with apical thinning and prominent thinning of the basal inferoseptal wall; no ischemia; EF 62 with normal wall motion.     ASSESSMENT AND PLAN:  1. CAD s/p BMS to RCA in 2011 in the setting of inferior STEMI/VF arrest: doing well from a cardiac standpoint. No anginal complaints. No recent SL nitro use. Grateful for the amazing care she received during her cardiac event and anticipating her 10 year anniversary next year.  - Continue aspirin and plavix - Continue crestor - SL nitro renewed  2. HTN: BP 147/82 today. She has not taken her medications this morning. She has been taking lisinopril 5mg  daily (this was decreased some time ago for low blood pressures) - Continue current lisinopril, budesonide, and metoprolol for now - She will keep a  log of her blood pressures over the next week and notify the office if persistently >130/80 to determine if lisinopril needs to be increased.   3. HLD: LDL 69 01/2019; followed by PCP - Continue crestor   Current medicines are reviewed at length with the patient today.  The patient does not have concerns regarding medicines.  The following changes have been made:  no change  Labs/ tests ordered today include: None  Orders Placed This Encounter  Procedures  . EKG 12-Lead     Disposition:   FU with Dr. Gwenlyn Found in 1 year  Signed, Abigail Butts, PA-C  11/29/2019 10:08 AM

## 2019-11-29 ENCOUNTER — Other Ambulatory Visit: Payer: Self-pay

## 2019-11-29 ENCOUNTER — Encounter: Payer: Self-pay | Admitting: Medical

## 2019-11-29 ENCOUNTER — Ambulatory Visit (INDEPENDENT_AMBULATORY_CARE_PROVIDER_SITE_OTHER): Payer: 59 | Admitting: Medical

## 2019-11-29 VITALS — BP 147/82 | HR 53 | Temp 97.5°F | Ht 66.0 in | Wt 167.8 lb

## 2019-11-29 DIAGNOSIS — I2510 Atherosclerotic heart disease of native coronary artery without angina pectoris: Secondary | ICD-10-CM | POA: Diagnosis not present

## 2019-11-29 DIAGNOSIS — E7800 Pure hypercholesterolemia, unspecified: Secondary | ICD-10-CM | POA: Diagnosis not present

## 2019-11-29 DIAGNOSIS — I10 Essential (primary) hypertension: Secondary | ICD-10-CM | POA: Diagnosis not present

## 2019-11-29 MED ORDER — LISINOPRIL 5 MG PO TABS: 5.0000 mg | ORAL_TABLET | Freq: Every day | ORAL | 11 refills | Status: DC

## 2019-11-29 MED ORDER — NITROGLYCERIN 0.4 MG SL SUBL: SUBLINGUAL_TABLET | SUBLINGUAL | 1 refills | Status: DC

## 2019-11-29 NOTE — Patient Instructions (Addendum)
Medication Instructions:  Start taking lisinopril 5 mg 1 tablet daily   *If you need a refill on your cardiac medications before your next appointment, please call your pharmacy*  Lab Work: If you have labs (blood work) drawn today and your tests are completely normal, you will receive your results only by: Marland Kitchen MyChart Message (if you have MyChart) OR . A paper copy in the mail If you have any lab test that is abnormal or we need to change your treatment, we will call you to review the results.  Testing/Procedures: None  Follow-Up: At [facility], you and your health needs are our priority.  As part of our continuing mission to provide you with exceptional heart care, we have created designated Provider Care Teams.  These Care Teams include your primary Cardiologist (physician) and Advanced Practice Providers (APPs -  Physician Assistants and Nurse Practitioners) who all work together to provide you with the care you need, when you need it.  Your next appointment:   12 month(s)  The format for your next appointment:   Either In Person or Virtual  Provider:   Quay Burow, MD  Other Instructions:  Please keep logs of your blood pressure daily for a week, call or message Dr. Gwenlyn Found if your BP is greater than 130/80 consistently .

## 2019-11-30 ENCOUNTER — Other Ambulatory Visit: Payer: Self-pay | Admitting: Cardiovascular Disease

## 2020-01-02 ENCOUNTER — Other Ambulatory Visit: Payer: Self-pay | Admitting: Cardiovascular Disease

## 2020-01-04 ENCOUNTER — Other Ambulatory Visit: Payer: Self-pay

## 2020-01-04 MED ORDER — ESCITALOPRAM OXALATE 10 MG PO TABS: 10.0000 mg | ORAL_TABLET | Freq: Every day | ORAL | 0 refills | Status: DC

## 2020-01-09 ENCOUNTER — Encounter: Payer: Self-pay | Admitting: Family Medicine

## 2020-01-09 ENCOUNTER — Telehealth: Payer: Self-pay | Admitting: Family Medicine

## 2020-01-09 NOTE — Telephone Encounter (Signed)
Letter sent to Pt in mychart. Will call to inform.

## 2020-01-09 NOTE — Telephone Encounter (Signed)
Please advise 

## 2020-01-09 NOTE — Telephone Encounter (Signed)
Pt scheduled her COVID vaccine for this Friday with the [facility] at [facility]. Per pt, since she answered YES to htn and cardiovascular disease, she requires a note from her PCP approving the vaccine for her. She is requesting note be emailed to her if possible.

## 2020-01-09 NOTE — Telephone Encounter (Signed)
[place] for pt to proceed w/ COVID vaccine.  It is my medical opinion that Ms Vicki [name] is cleared to receive her COVID vaccine as scheduled

## 2020-01-26 ENCOUNTER — Other Ambulatory Visit: Payer: Self-pay | Admitting: Gastroenterology

## 2020-01-26 DIAGNOSIS — R109 Unspecified abdominal pain: Secondary | ICD-10-CM

## 2020-01-31 ENCOUNTER — Ambulatory Visit
Admission: RE | Admit: 2020-01-31 | Discharge: 2020-01-31 | Disposition: A | Discharge status: Home / Self Care | Payer: 59 | Source: Ambulatory Visit | Attending: Gastroenterology | Admitting: Gastroenterology

## 2020-01-31 DIAGNOSIS — R109 Unspecified abdominal pain: Secondary | ICD-10-CM

## 2020-02-05 ENCOUNTER — Other Ambulatory Visit: Payer: Self-pay | Admitting: Gastroenterology

## 2020-02-05 DIAGNOSIS — R9389 Abnormal findings on diagnostic imaging of other specified body structures: Secondary | ICD-10-CM

## 2020-02-21 ENCOUNTER — Other Ambulatory Visit: Payer: Self-pay

## 2020-02-21 ENCOUNTER — Ambulatory Visit
Admission: RE | Admit: 2020-02-21 | Discharge: 2020-02-21 | Disposition: A | Discharge status: Home / Self Care | Payer: 59 | Source: Ambulatory Visit | Attending: Gastroenterology | Admitting: Gastroenterology

## 2020-02-21 DIAGNOSIS — R9389 Abnormal findings on diagnostic imaging of other specified body structures: Secondary | ICD-10-CM

## 2020-02-21 MED ORDER — GADOBENATE DIMEGLUMINE 529 MG/ML IV SOLN
15.0000 mL | Freq: Once | INTRAVENOUS | Status: AC | PRN
  Administered 2020-02-21: 15 mL via INTRAVENOUS

## 2020-02-23 ENCOUNTER — Other Ambulatory Visit: Payer: Self-pay | Admitting: Gastroenterology

## 2020-02-23 ENCOUNTER — Telehealth: Payer: Self-pay | Admitting: Oncology

## 2020-02-23 ENCOUNTER — Encounter: Payer: Self-pay | Admitting: Oncology

## 2020-02-23 NOTE — Progress Notes (Signed)
Phone call to [facility], spoke with [facility] concerning referral.  Informed her Dr. Benay Spice will be seeing the patient on 4/6 at 2 pm, however he is recommending the patient have a EUS for staging and biopsy prior to coming in.  According to [facility] there is a note in the chart that Dr. Paulita Fujita is trying to do on 3/31 or 4/1 pending prior approval.

## 2020-02-23 NOTE — Telephone Encounter (Signed)
Received a new pt referral from Dr. Penelope Coop for supucious for primary pancreatic neoplasm such as adenocarcinoma. Vicki [name] has been cld and scheduled to see Dr. Benay Spice on 4/6 at 2pm. Letter mailed.

## 2020-02-26 ENCOUNTER — Other Ambulatory Visit (HOSPITAL_COMMUNITY)
Admission: RE | Admit: 2020-02-26 | Discharge: 2020-02-26 | Disposition: A | Discharge status: Home / Self Care | Payer: 59 | Source: Ambulatory Visit | Attending: Gastroenterology | Admitting: Gastroenterology

## 2020-02-26 DIAGNOSIS — Z01812 Encounter for preprocedural laboratory examination: Secondary | ICD-10-CM | POA: Insufficient documentation

## 2020-02-26 DIAGNOSIS — Z20822 Contact with and (suspected) exposure to covid-19: Secondary | ICD-10-CM | POA: Diagnosis not present

## 2020-02-26 LAB — SARS CORONAVIRUS 2 (TAT 6-24 HRS): SARS Coronavirus 2: NEGATIVE

## 2020-02-26 NOTE — Progress Notes (Signed)
Spoke with patient introduced myself as GI nurse navigator and explained my role.  She states she is scheduled for EUS/ERCP on 4/1 at [place].  She is drinking well, not eating much, reinforced need to supplement with Ensure/Boost or other protein shakes during this time.  She also is having some constipation issues.  Suggested her to try Miralax at least qhs if not bid to keep from getting constipated.  She states she will try the supplments and Miralax.  Explained to her to arrive at least 15 minutes early for her appointment with medical oncology on 4/6 at 2 pm.  She will bring her husband of 22 years Merry Proud with her to the appointment.  She also was given my direct phone number for any questions or concerns.

## 2020-02-29 ENCOUNTER — Other Ambulatory Visit: Payer: 59

## 2020-02-29 ENCOUNTER — Other Ambulatory Visit: Payer: Self-pay

## 2020-02-29 ENCOUNTER — Encounter (HOSPITAL_COMMUNITY): Payer: Self-pay | Admitting: Gastroenterology

## 2020-02-29 ENCOUNTER — Encounter (HOSPITAL_COMMUNITY)
Admission: RE | Disposition: A | Discharge status: Home / Self Care | Payer: Self-pay | Source: Home / Self Care | Attending: Gastroenterology

## 2020-02-29 ENCOUNTER — Ambulatory Visit (HOSPITAL_COMMUNITY): Payer: 59 | Admitting: Registered Nurse

## 2020-02-29 ENCOUNTER — Ambulatory Visit (HOSPITAL_COMMUNITY)
Admission: RE | Admit: 2020-02-29 | Discharge: 2020-02-29 | Disposition: A | Discharge status: Home / Self Care | Payer: 59 | Attending: Gastroenterology | Admitting: Gastroenterology

## 2020-02-29 ENCOUNTER — Ambulatory Visit (HOSPITAL_COMMUNITY): Payer: 59

## 2020-02-29 DIAGNOSIS — I2510 Atherosclerotic heart disease of native coronary artery without angina pectoris: Secondary | ICD-10-CM | POA: Diagnosis not present

## 2020-02-29 DIAGNOSIS — K8689 Other specified diseases of pancreas: Secondary | ICD-10-CM

## 2020-02-29 DIAGNOSIS — I10 Essential (primary) hypertension: Secondary | ICD-10-CM | POA: Diagnosis not present

## 2020-02-29 DIAGNOSIS — Z955 Presence of coronary angioplasty implant and graft: Secondary | ICD-10-CM | POA: Diagnosis not present

## 2020-02-29 DIAGNOSIS — Z87891 Personal history of nicotine dependence: Secondary | ICD-10-CM | POA: Insufficient documentation

## 2020-02-29 DIAGNOSIS — C250 Malignant neoplasm of head of pancreas: Secondary | ICD-10-CM | POA: Diagnosis not present

## 2020-02-29 DIAGNOSIS — I252 Old myocardial infarction: Secondary | ICD-10-CM | POA: Insufficient documentation

## 2020-02-29 DIAGNOSIS — D490 Neoplasm of unspecified behavior of digestive system: Secondary | ICD-10-CM | POA: Diagnosis present

## 2020-02-29 HISTORY — PX: FINE NEEDLE ASPIRATION: SHX6590

## 2020-02-29 HISTORY — PX: UPPER ESOPHAGEAL ENDOSCOPIC ULTRASOUND (EUS): SHX6562

## 2020-02-29 HISTORY — PX: ENDOSCOPIC RETROGRADE CHOLANGIOPANCREATOGRAPHY (ERCP) WITH PROPOFOL: SHX5810

## 2020-02-29 HISTORY — PX: ESOPHAGOGASTRODUODENOSCOPY (EGD) WITH PROPOFOL: SHX5813

## 2020-02-29 HISTORY — PX: SPHINCTEROTOMY: SHX5544

## 2020-02-29 HISTORY — PX: PANCREATIC STENT PLACEMENT: SHX5539

## 2020-02-29 SURGERY — UPPER ESOPHAGEAL ENDOSCOPIC ULTRASOUND (EUS)
Anesthesia: General

## 2020-02-29 MED ORDER — GLUCAGON HCL RDNA (DIAGNOSTIC) 1 MG IJ SOLR
INTRAMUSCULAR | Status: AC
  Filled 2020-02-29: qty 1

## 2020-02-29 MED ORDER — DEXAMETHASONE SODIUM PHOSPHATE 10 MG/ML IJ SOLN
INTRAMUSCULAR | Status: DC | PRN
  Administered 2020-02-29: 8 mg via INTRAVENOUS

## 2020-02-29 MED ORDER — INDOMETHACIN 50 MG RE SUPP
RECTAL | Status: DC | PRN
  Administered 2020-02-29: 100 mg via RECTAL

## 2020-02-29 MED ORDER — LACTATED RINGERS IV SOLN: INTRAVENOUS | Status: DC | PRN

## 2020-02-29 MED ORDER — ONDANSETRON HCL 4 MG/2ML IJ SOLN
4.0000 mg | Freq: Once | INTRAMUSCULAR | Status: AC
  Administered 2020-02-29: 4 mg via INTRAVENOUS

## 2020-02-29 MED ORDER — CIPROFLOXACIN IN D5W 400 MG/200ML IV SOLN
INTRAVENOUS | Status: DC | PRN
  Administered 2020-02-29: 400 mg via INTRAVENOUS

## 2020-02-29 MED ORDER — SODIUM CHLORIDE 0.9 % IV SOLN: INTRAVENOUS | Status: DC

## 2020-02-29 MED ORDER — SUGAMMADEX SODIUM 200 MG/2ML IV SOLN
INTRAVENOUS | Status: DC | PRN
  Administered 2020-02-29: 200 mg via INTRAVENOUS

## 2020-02-29 MED ORDER — FENTANYL CITRATE (PF) 100 MCG/2ML IJ SOLN
INTRAMUSCULAR | Status: AC
  Filled 2020-02-29: qty 2

## 2020-02-29 MED ORDER — SODIUM CHLORIDE 0.9 % IV SOLN
INTRAVENOUS | Status: DC | PRN
  Administered 2020-02-29: 15:00:00 10 mL

## 2020-02-29 MED ORDER — FENTANYL CITRATE (PF) 100 MCG/2ML IJ SOLN
INTRAMUSCULAR | Status: DC | PRN
  Administered 2020-02-29 (×3): 50 ug via INTRAVENOUS

## 2020-02-29 MED ORDER — GLUCAGON HCL RDNA (DIAGNOSTIC) 1 MG IJ SOLR
INTRAMUSCULAR | Status: DC | PRN
  Administered 2020-02-29 (×2): .5 mg via INTRAVENOUS

## 2020-02-29 MED ORDER — ONDANSETRON HCL 4 MG/2ML IJ SOLN
INTRAMUSCULAR | Status: AC
  Filled 2020-02-29: qty 2

## 2020-02-29 MED ORDER — ONDANSETRON HCL 4 MG/2ML IJ SOLN
INTRAMUSCULAR | Status: DC | PRN
  Administered 2020-02-29: 4 mg via INTRAVENOUS

## 2020-02-29 MED ORDER — ONDANSETRON HCL 4 MG PO TABS: 4.0000 mg | ORAL_TABLET | Freq: Three times a day (TID) | ORAL | 1 refills | Status: DC | PRN

## 2020-02-29 MED ORDER — INDOMETHACIN 50 MG RE SUPP
RECTAL | Status: AC
  Filled 2020-02-29: qty 2

## 2020-02-29 MED ORDER — CIPROFLOXACIN IN D5W 400 MG/200ML IV SOLN
INTRAVENOUS | Status: AC
  Filled 2020-02-29: qty 200

## 2020-02-29 MED ORDER — LIDOCAINE 2% (20 MG/ML) 5 ML SYRINGE
INTRAMUSCULAR | Status: DC | PRN
  Administered 2020-02-29: 80 mg via INTRAVENOUS

## 2020-02-29 MED ORDER — PHENYLEPHRINE 40 MCG/ML (10ML) SYRINGE FOR IV PUSH (FOR BLOOD PRESSURE SUPPORT)
PREFILLED_SYRINGE | INTRAVENOUS | Status: DC | PRN
  Administered 2020-02-29: 80 ug via INTRAVENOUS

## 2020-02-29 MED ORDER — MIDAZOLAM HCL 5 MG/5ML IJ SOLN
INTRAMUSCULAR | Status: DC | PRN
  Administered 2020-02-29: 2 mg via INTRAVENOUS

## 2020-02-29 MED ORDER — PROPOFOL 10 MG/ML IV BOLUS
INTRAVENOUS | Status: AC
  Filled 2020-02-29: qty 20

## 2020-02-29 MED ORDER — MIDAZOLAM HCL 2 MG/2ML IJ SOLN
INTRAMUSCULAR | Status: AC
  Filled 2020-02-29: qty 2

## 2020-02-29 MED ORDER — PROPOFOL 10 MG/ML IV BOLUS
INTRAVENOUS | Status: DC | PRN
  Administered 2020-02-29: 110 mg via INTRAVENOUS

## 2020-02-29 MED ORDER — SUCCINYLCHOLINE CHLORIDE 200 MG/10ML IV SOSY
PREFILLED_SYRINGE | INTRAVENOUS | Status: DC | PRN
  Administered 2020-02-29: 100 mg via INTRAVENOUS

## 2020-02-29 MED ORDER — ROCURONIUM BROMIDE 10 MG/ML (PF) SYRINGE
PREFILLED_SYRINGE | INTRAVENOUS | Status: DC | PRN
  Administered 2020-02-29: 20 mg via INTRAVENOUS
  Administered 2020-02-29: 40 mg via INTRAVENOUS

## 2020-02-29 MED ORDER — OXYCODONE-ACETAMINOPHEN 10-325 MG PO TABS: 1.0000 | ORAL_TABLET | Freq: Four times a day (QID) | ORAL | 0 refills | Status: DC | PRN

## 2020-02-29 NOTE — H&P (Signed)
Patient interval history reviewed.  Patient examined again.  There has been no change from documented H/P scanned into chart from our office except as documented below.  Assessment:  1.  Abdominal pain. 2.  Jaundice. 3.  Abnormal imaging:  Pancreatic mass with cbd/pd dilatation.  Plan:  1.  Upper endoscopic ultrasound with possible fine needle aspiration biopsies. 2.  Risks (bleeding, infection, bowel perforation that could require surgery, sedation-related changes in cardiopulmonary systems), benefits (identification and possible treatment of source of symptoms, exclusion of certain causes of symptoms), and alternatives (watchful waiting, radiographic imaging studies, empiric medical treatment) of upper endoscopy with ultrasound and fine needle aspiration (EUS +/- FNA) were explained to patient/family in detail and patient wishes to proceed. 3.  Endoscopic retrograde cholangiopancreatography with anticipated biliary stent placement. 4.  Risks (up to and including bleeding, infection, perforation, pancreatitis that can be complicated by infected necrosis and death), benefits (removal of stones, alleviating blockage, decreasing risk of cholangitis or choledocholithiasis-related pancreatitis), and alternatives (watchful waiting, percutaneous transhepatic cholangiography) of ERCP were explained to patient/family in detail and patient elects to proceed.

## 2020-02-29 NOTE — Op Note (Signed)
[facility] Patient Name: Vicki [name] Procedure Date: 02/29/2020 MRN: [id number] Attending MD: Arta Silence , MD Date of Birth: 1956/07/30 CSN: [id number] Age: 64 Admit Type: Outpatient Procedure:                ERCP Indications:              Elevated liver enzymes, Tumor of the head of                            pancreas Providers:                Arta Silence, MD, Cleda Daub, RN, Laverda Sorenson, Technician, Courtney Heys. [place], CRNA Referring MD:             Anson Fret, MD Medicines:                General Anesthesia, Cipro 400 mg IV, Indomethacin                            123XX123 mg PR Complications:            No immediate complications. Estimated Blood Loss:     Estimated blood loss was minimal. Procedure:                Pre-Anesthesia Assessment:                           - Prior to the procedure, a History and Physical                            was performed, and patient medications and                            allergies were reviewed. The patient's tolerance of                            previous anesthesia was also reviewed. The risks                            and benefits of the procedure and the sedation                            options and risks were discussed with the patient.                            All questions were answered, and informed consent                            was obtained. Prior Anticoagulants: The patient has                            taken Plavix (clopidogrel), last dose was 7 days  prior to procedure. ASA Grade Assessment: III - A                            patient with severe systemic disease. After                            reviewing the risks and benefits, the patient was                            deemed in satisfactory condition to undergo the                            procedure.                           After obtaining informed consent, the scope was                passed under direct vision. Throughout the                            procedure, the patient's blood pressure, pulse, and                            oxygen saturations were monitored continuously. The                            [id number]) Olympus was introduced through                            the mouth, and used to inject contrast into without                            successful cannulation. The ERCP was performed with                            difficulty due to challenging cannulation. The                            patient tolerated the procedure well. Scope In: Scope Out: Findings:      The scout film was normal. The major papilla was normal. A wire was       preferentially passed into the ventral pancreatic duct. As a result, one       5 Fr by 5 cm temporary stent with a full external pigtail and a single       internal flap was placed 4 cm into the ventral pancreatic duct. Clear       fluid flowed through the stent. The stent was in good position. Unable       to cannulate with the sphincterotome over the stent, thus a 4 mm biliary       sphincterotomy was made with a needle knife over a pancreatic stent       using blended current. There was no post-sphincterotomy bleeding.       Further attempts to cannulate with standard and small sized       sphincterotomes were unsuccessful; procedure  stopped. Impression:               - The major papilla appeared normal.                           - One temporary stent was placed into the ventral                            pancreatic duct.                           - A biliary sphincterotomy was performed.                           - Unable to cannulate common bile duct. Moderate Sedation:      None Recommendation:           - Avoid aspirin and nonsteroidal anti-inflammatory                            medicines for 5 days.                           - Discharge patient to home (via wheelchair).                            - Soft diet today.                           - Await cytology results.                           - Consider retry ERCP at appointment to be                            scheduled with another one of my partners.                           - Return to GI clinic after studies are complete. Procedure Code(s):        --- Professional ---                           [phone], Esophagogastroduodenoscopy, flexible,                            transoral; diagnostic, including collection of                            specimen(s) by brushing or washing, when performed                            (separate procedure) Diagnosis Code(s):        --- Professional ---                           R74.8, Abnormal levels of other serum enzymes  D49.0, Neoplasm of unspecified behavior of                            digestive system CPT copyright 2019 American [employer]. All rights reserved. The codes documented in this report are preliminary and upon coder review may  be revised to meet current compliance requirements. Arta Silence, MD 02/29/2020 3:17:23 PM This report has been signed electronically. Number of Addenda: 0

## 2020-02-29 NOTE — Discharge Instructions (Signed)

## 2020-02-29 NOTE — Transfer of Care (Signed)
Immediate Anesthesia Transfer of Care Note  Patient: Vicki [name]  Procedure(s) Performed: UPPER ESOPHAGEAL ENDOSCOPIC ULTRASOUND (EUS) WITH FNA  AND LINEAR SCOPE (N/A ) ENDOSCOPIC RETROGRADE CHOLANGIOPANCREATOGRAPHY (ERCP) WITH PROPOFOL (N/A )  Patient Location: PACU and Endoscopy Unit  Anesthesia Type:General  Level of Consciousness: awake, alert , oriented and patient cooperative  Airway & Oxygen Therapy: Patient Spontanous Breathing and Patient connected to face mask oxygen  Post-op Assessment: Report given to RN, Post -op Vital signs reviewed and stable and Patient moving all extremities  Post vital signs: Reviewed and stable  Last Vitals:  Vitals Value Taken Time  BP 171/84 02/29/20 1520  Temp 36.5 C 02/29/20 1520  Pulse 81 02/29/20 1523  Resp 17 02/29/20 1523  SpO2 100 % 02/29/20 1523  Vitals shown include unvalidated device data.  Last Pain:  Vitals:   02/29/20 1520  TempSrc: Temporal  PainSc: Asleep         Complications: No apparent anesthesia complications

## 2020-02-29 NOTE — Anesthesia Preprocedure Evaluation (Signed)
Anesthesia Evaluation  Patient identified by MRN, date of birth, ID band Patient awake    Reviewed: Allergy & Precautions, H&P , NPO status , Patient's Chart, lab work & pertinent test results  Airway Mallampati: II   Neck ROM: full    Dental   Pulmonary former smoker,    breath sounds clear to auscultation       Cardiovascular hypertension, + CAD, + Past MI and + Cardiac Stents   Rhythm:regular Rate:Normal     Neuro/Psych    GI/Hepatic   Endo/Other    Renal/GU      Musculoskeletal   Abdominal   Peds  Hematology   Anesthesia Other Findings   Reproductive/Obstetrics                             Anesthesia Physical Anesthesia Plan  ASA: III  Anesthesia Plan: General   Post-op Pain Management:    Induction: Intravenous  PONV Risk Score and Plan: 3 and Ondansetron, Dexamethasone, Midazolam and Treatment may vary due to age or medical condition  Airway Management Planned: Oral ETT  Additional Equipment:   Intra-op Plan:   Post-operative Plan: Extubation in OR  Informed Consent: I have reviewed the patients History and Physical, chart, labs and discussed the procedure including the risks, benefits and alternatives for the proposed anesthesia with the patient or authorized representative who has indicated his/her understanding and acceptance.       Plan Discussed with: CRNA, Anesthesiologist and Surgeon  Anesthesia Plan Comments:         Anesthesia Quick Evaluation

## 2020-02-29 NOTE — Op Note (Signed)
[facility] Patient Name: Vicki [name] Procedure Date: 02/29/2020 MRN: [id number] Attending MD: Arta Silence , MD Date of Birth: Sep 10, 1956 CSN: [id number] Age: 64 Admit Type: Outpatient Procedure:                Upper EUS Indications:              Suspected mass in pancreas on MRCP, elevated LFTs,                            obstructive jaundice, dilated bile duct Providers:                Arta Silence, MD, Cleda Daub, RN, Laverda Sorenson, Technician, Courtney Heys. [place], CRNA Referring MD:             Anson Fret, MD Medicines:                General Anesthesia Complications:            No immediate complications. Estimated Blood Loss:     Estimated blood loss was minimal. Procedure:                Pre-Anesthesia Assessment:                           - Prior to the procedure, a History and Physical                            was performed, and patient medications and                            allergies were reviewed. The patient's tolerance of                            previous anesthesia was also reviewed. The risks                            and benefits of the procedure and the sedation                            options and risks were discussed with the patient.                            All questions were answered, and informed consent                            was obtained. Prior Anticoagulants: The patient has                            taken Plavix (clopidogrel), last dose was 7 days                            prior to procedure. ASA Grade Assessment: III - A  patient with severe systemic disease. After                            reviewing the risks and benefits, the patient was                            deemed in satisfactory condition to undergo the                            procedure.                           After obtaining informed consent, the endoscope was                            passed  under direct vision. Throughout the                            procedure, the patient's blood pressure, pulse, and                            oxygen saturations were monitored continuously. The                            [id number]) Olympus Linear EUS was                            introduced through the mouth, and advanced to the                            second part of duodenum. The upper EUS was                            accomplished without difficulty. The patient                            tolerated the procedure well. Scope In: Scope Out: Findings:      ENDOSONOGRAPHIC FINDING: :      Minimal hyperechoic material consistent with sludge was visualized       endosonographically in the common bile duct and in the gallbladder       (which was moderately dilated).      One abnormal lymph node was visualized in the peripancreatic region. The       node was round, hypoechoic and had well defined margins.      There was dilation in the common bile duct which measured up to 13 mm.      There was no sign of significant endosonographic abnormality in the       ampulla.      An irregular mass was identified in the pancreatic head. The mass was       hypoechoic. The mass measured approximately 20 mm by 23 mm in maximal       cross-sectional diameter. The outer margins were smooth. The remainder       of the pancreas was examined. The endosonographic appearance of       parenchyma and  the upstream pancreatic duct indicated duct dilation.       Fine needle aspiration for cytology was performed. Color Doppler imaging       was utilized prior to needle puncture to confirm a lack of significant       vascular structures within the needle path. Four passes were made with       the 25 gauge needle using a transduodenal approach. A stylet was used. A       cytologist was present and performed a preliminary cytologic       examination. Final cytology results are pending. Impression:                - Hyperechoic material consistent with sludge was                            visualized endosonographically in the common bile                            duct and in the gallbladder.                           - One abnormal lymph node was visualized in the                            peripancreatic region.                           - There was dilation in the common bile duct which                            measured up to 13 mm.                           - There was no sign of significant pathology in the                            ampulla.                           - A mass was identified in the pancreatic head.                            This was staged T3 N1 Mx by endosonographic                            criteria. Fine needle aspiration performed. Moderate Sedation:      None Recommendation:           - Perform an ERCP today. Procedure Code(s):        --- Professional ---                           [phone], Esophagogastroduodenoscopy, flexible,                            transoral; with transendoscopic ultrasound-guided  intramural or transmural fine needle                            aspiration/biopsy(s), (includes endoscopic                            ultrasound examination limited to the esophagus,                            stomach or duodenum, and adjacent structures) Diagnosis Code(s):        --- Professional ---                           K83.8, Other specified diseases of biliary tract                           I89.9, Noninfective disorder of lymphatic vessels                            and lymph nodes, unspecified                           K86.89, Other specified diseases of pancreas                           R93.3, Abnormal findings on diagnostic imaging of                            other parts of digestive tract CPT copyright 2019 American Medical Association. All rights reserved. The codes documented in this report are preliminary and upon  coder review may  be revised to meet current compliance requirements. Arta Silence, MD 02/29/2020 3:11:46 PM This report has been signed electronically. Number of Addenda: 0

## 2020-02-29 NOTE — Anesthesia Procedure Notes (Signed)
Procedure Name: Intubation Date/Time: 02/29/2020 12:52 PM Performed by: Victoriano Lain, CRNA Pre-anesthesia Checklist: Patient identified, Emergency Drugs available, Suction available and Patient being monitored Patient Re-evaluated:Patient Re-evaluated prior to induction Oxygen Delivery Method: Circle system utilized Preoxygenation: Pre-oxygenation with 100% oxygen Induction Type: IV induction, Rapid sequence and Cricoid Pressure applied Laryngoscope Size: Mac and 4 Grade View: Grade I Tube type: Oral Number of attempts: 1 Airway Equipment and Method: Stylet and Oral airway Placement Confirmation: ETT inserted through vocal cords under direct vision,  positive ETCO2 and breath sounds checked- equal and bilateral Secured at: 21 cm Tube secured with: Tape Dental Injury: Teeth and Oropharynx as per pre-operative assessment  Comments: RSI with cricoid pressure by Dr. Marcie Bal

## 2020-03-01 LAB — CYTOLOGY - NON PAP

## 2020-03-04 ENCOUNTER — Other Ambulatory Visit (HOSPITAL_COMMUNITY)
Admission: RE | Admit: 2020-03-04 | Discharge: 2020-03-04 | Disposition: A | Discharge status: Home / Self Care | Payer: 59 | Source: Ambulatory Visit | Attending: Gastroenterology | Admitting: Gastroenterology

## 2020-03-04 ENCOUNTER — Other Ambulatory Visit: Payer: Self-pay | Admitting: Gastroenterology

## 2020-03-04 ENCOUNTER — Telehealth: Payer: Self-pay | Admitting: *Deleted

## 2020-03-04 DIAGNOSIS — Z01812 Encounter for preprocedural laboratory examination: Secondary | ICD-10-CM | POA: Insufficient documentation

## 2020-03-04 DIAGNOSIS — Z20822 Contact with and (suspected) exposure to covid-19: Secondary | ICD-10-CM | POA: Insufficient documentation

## 2020-03-04 LAB — SARS CORONAVIRUS 2 (TAT 6-24 HRS): SARS Coronavirus 2: NEGATIVE

## 2020-03-04 NOTE — Anesthesia Postprocedure Evaluation (Signed)
Anesthesia Post Note  Patient: Vicki [name]  Procedure([name]) Performed: UPPER ESOPHAGEAL ENDOSCOPIC ULTRASOUND (EUS)  LINEAR SCOPE (N/A ) ENDOSCOPIC RETROGRADE CHOLANGIOPANCREATOGRAPHY (ERCP) WITH PROPOFOL (N/A ) SPHINCTEROTOMY ESOPHAGOGASTRODUODENOSCOPY (EGD) WITH PROPOFOL (N/A ) FINE NEEDLE ASPIRATION PANCREATIC STENT PLACEMENT     Patient location during evaluation: Endoscopy Anesthesia Type: General Level of consciousness: awake and alert Pain management: pain level controlled Vital Signs Assessment: post-procedure vital signs reviewed and stable Respiratory status: spontaneous breathing, nonlabored ventilation, respiratory function stable and patient connected to nasal cannula oxygen Cardiovascular status: blood pressure returned to baseline and stable Postop Assessment: no apparent nausea or vomiting Anesthetic complications: no    Last Vitals:  Vitals:   02/29/20 1530 02/29/20 1540  BP: (!) 154/72 (!) 152/113  Pulse: 86 87  Resp: 19 18  Temp:    SpO2: 95% 97%    Last Pain:  Vitals:   02/29/20 1520  TempSrc: Temporal                 Vicki [name]

## 2020-03-04 NOTE — Progress Notes (Signed)
Spoke with patient as Dr. Benay Spice had received a call from Dr. Watt Climes wanting to attempt to place stent and get tissue biopsy tomorrow 4/6 at 1:00 pm.  Informed her we have moved her new patient appointment to next Tuesday 4/13 at 2:00 to arrive at least 20 minutes prior for registration.  She verbalized an understanding.

## 2020-03-04 NOTE — Telephone Encounter (Signed)
Had ERCP on 02/29/20. Going back tomorrow at 1 pm to attempt stent placement. Is it OK to proceed, or does she need to see Dr. Benay Spice tomorrow and stent later? Per Dr. Benay Spice : Proceed w/stent and try to obtain tissue for biopsy if possible. Notified Barbara via VM. GI Navigator requested to reschedule patient to 4/13 and call her.

## 2020-03-05 ENCOUNTER — Ambulatory Visit (HOSPITAL_COMMUNITY): Payer: 59

## 2020-03-05 ENCOUNTER — Ambulatory Visit (HOSPITAL_COMMUNITY): Payer: 59 | Admitting: Anesthesiology

## 2020-03-05 ENCOUNTER — Encounter (HOSPITAL_COMMUNITY): Payer: Self-pay | Admitting: Gastroenterology

## 2020-03-05 ENCOUNTER — Encounter (HOSPITAL_COMMUNITY)
Admission: RE | Disposition: A | Discharge status: Home / Self Care | Payer: Self-pay | Source: Home / Self Care | Attending: Gastroenterology

## 2020-03-05 ENCOUNTER — Other Ambulatory Visit: Payer: Self-pay

## 2020-03-05 ENCOUNTER — Ambulatory Visit (HOSPITAL_COMMUNITY)
Admission: RE | Admit: 2020-03-05 | Discharge: 2020-03-05 | Disposition: A | Discharge status: Home / Self Care | Payer: 59 | Attending: Gastroenterology | Admitting: Gastroenterology

## 2020-03-05 ENCOUNTER — Inpatient Hospital Stay: Payer: 59 | Admitting: Oncology

## 2020-03-05 DIAGNOSIS — K831 Obstruction of bile duct: Secondary | ICD-10-CM | POA: Insufficient documentation

## 2020-03-05 DIAGNOSIS — I10 Essential (primary) hypertension: Secondary | ICD-10-CM | POA: Insufficient documentation

## 2020-03-05 DIAGNOSIS — Z955 Presence of coronary angioplasty implant and graft: Secondary | ICD-10-CM | POA: Diagnosis not present

## 2020-03-05 DIAGNOSIS — R17 Unspecified jaundice: Secondary | ICD-10-CM

## 2020-03-05 DIAGNOSIS — Z87891 Personal history of nicotine dependence: Secondary | ICD-10-CM | POA: Insufficient documentation

## 2020-03-05 DIAGNOSIS — I2510 Atherosclerotic heart disease of native coronary artery without angina pectoris: Secondary | ICD-10-CM | POA: Insufficient documentation

## 2020-03-05 DIAGNOSIS — I252 Old myocardial infarction: Secondary | ICD-10-CM | POA: Diagnosis not present

## 2020-03-05 DIAGNOSIS — C250 Malignant neoplasm of head of pancreas: Secondary | ICD-10-CM | POA: Diagnosis present

## 2020-03-05 HISTORY — PX: ERCP: SHX5425

## 2020-03-05 HISTORY — PX: SPHINCTEROTOMY: SHX5544

## 2020-03-05 HISTORY — PX: BILIARY STENT PLACEMENT: SHX5538

## 2020-03-05 SURGERY — ERCP, WITH INTERVENTION IF INDICATED
Anesthesia: General

## 2020-03-05 MED ORDER — SODIUM CHLORIDE 0.9 % IV SOLN: INTRAVENOUS | Status: DC

## 2020-03-05 MED ORDER — SODIUM CHLORIDE 0.9 % IV SOLN
INTRAVENOUS | Status: DC | PRN
  Administered 2020-03-05: 25 mL

## 2020-03-05 MED ORDER — MIDAZOLAM HCL 2 MG/2ML IJ SOLN
INTRAMUSCULAR | Status: AC
  Filled 2020-03-05: qty 2

## 2020-03-05 MED ORDER — FENTANYL CITRATE (PF) 100 MCG/2ML IJ SOLN
INTRAMUSCULAR | Status: AC
  Filled 2020-03-05: qty 2

## 2020-03-05 MED ORDER — PROPOFOL 10 MG/ML IV BOLUS
INTRAVENOUS | Status: AC
  Filled 2020-03-05: qty 20

## 2020-03-05 MED ORDER — INDOMETHACIN 50 MG RE SUPP
RECTAL | Status: DC | PRN
  Administered 2020-03-05: 100 mg via RECTAL

## 2020-03-05 MED ORDER — PROMETHAZINE HCL 25 MG/ML IJ SOLN
6.2500 mg | Freq: Once | INTRAMUSCULAR | Status: AC | PRN
  Administered 2020-03-05: 6.25 mg via INTRAVENOUS

## 2020-03-05 MED ORDER — GLUCAGON HCL RDNA (DIAGNOSTIC) 1 MG IJ SOLR
INTRAMUSCULAR | Status: AC
  Filled 2020-03-05: qty 1

## 2020-03-05 MED ORDER — FENTANYL CITRATE (PF) 100 MCG/2ML IJ SOLN
INTRAMUSCULAR | Status: DC | PRN
  Administered 2020-03-05: 100 ug via INTRAVENOUS

## 2020-03-05 MED ORDER — ONDANSETRON HCL 4 MG/2ML IJ SOLN
INTRAMUSCULAR | Status: DC | PRN
  Administered 2020-03-05: 4 mg via INTRAVENOUS

## 2020-03-05 MED ORDER — LIDOCAINE 2% (20 MG/ML) 5 ML SYRINGE
INTRAMUSCULAR | Status: DC | PRN
  Administered 2020-03-05: 60 mg via INTRAVENOUS

## 2020-03-05 MED ORDER — MIDAZOLAM HCL 2 MG/2ML IJ SOLN
INTRAMUSCULAR | Status: DC | PRN
  Administered 2020-03-05: 2 mg via INTRAVENOUS

## 2020-03-05 MED ORDER — DEXAMETHASONE SODIUM PHOSPHATE 10 MG/ML IJ SOLN
INTRAMUSCULAR | Status: DC | PRN
  Administered 2020-03-05: 10 mg via INTRAVENOUS

## 2020-03-05 MED ORDER — ROCURONIUM BROMIDE 10 MG/ML (PF) SYRINGE
PREFILLED_SYRINGE | INTRAVENOUS | Status: DC | PRN
  Administered 2020-03-05: 60 mg via INTRAVENOUS

## 2020-03-05 MED ORDER — SUGAMMADEX SODIUM 200 MG/2ML IV SOLN
INTRAVENOUS | Status: DC | PRN
  Administered 2020-03-05: 200 mg via INTRAVENOUS

## 2020-03-05 MED ORDER — LACTATED RINGERS IV SOLN: INTRAVENOUS | Status: DC

## 2020-03-05 MED ORDER — PROPOFOL 10 MG/ML IV BOLUS
INTRAVENOUS | Status: DC | PRN
  Administered 2020-03-05: 150 mg via INTRAVENOUS

## 2020-03-05 MED ORDER — CIPROFLOXACIN IN D5W 400 MG/200ML IV SOLN
INTRAVENOUS | Status: AC
  Filled 2020-03-05: qty 200

## 2020-03-05 MED ORDER — PROMETHAZINE HCL 25 MG/ML IJ SOLN
INTRAMUSCULAR | Status: AC
  Filled 2020-03-05: qty 1

## 2020-03-05 MED ORDER — INDOMETHACIN 50 MG RE SUPP
RECTAL | Status: AC
  Filled 2020-03-05: qty 2

## 2020-03-05 MED ORDER — CIPROFLOXACIN IN D5W 400 MG/200ML IV SOLN
400.0000 mg | Freq: Once | INTRAVENOUS | Status: AC
  Administered 2020-03-05: 400 mg via INTRAVENOUS

## 2020-03-05 NOTE — Transfer of Care (Signed)
Immediate Anesthesia Transfer of Care Note  Patient: Vicki [name]  Procedure(s) Performed: ENDOSCOPIC RETROGRADE CHOLANGIOPANCREATOGRAPHY (ERCP) (N/A )  Patient Location: Endoscopy Unit  Anesthesia Type:General  Level of Consciousness: awake  Airway & Oxygen Therapy: Patient Spontanous Breathing and Patient connected to face mask oxygen  Post-op Assessment: Report given to RN and Post -op Vital signs reviewed and stable  Post vital signs: Reviewed and stable  Last Vitals:  Vitals Value Taken Time  BP    Temp    Pulse 64 03/05/20 1430  Resp 13 03/05/20 1430  SpO2 100 % 03/05/20 1430  Vitals shown include unvalidated device data.  Last Pain:  Vitals:   03/05/20 1219  TempSrc: Oral  PainSc: 8          Complications: No apparent anesthesia complications

## 2020-03-05 NOTE — Progress Notes (Signed)
Vicki [name] 1:15 PM  Subjective: Patient seen and examined in office and hospital computer chart reviewed and case discussed with my partner Dr. Paulita Fujita she has no new complaints but she might be constipated from her pain medicines which we discussed  Objective: Vital signs stable afebrile exam please see preassessment evaluation no acute distress labs and x-rays reviewed previous ERCP reviewed  Assessment: Pancreatic cancer obstructive jaundice  Plan: Okay to proceed with ERCP and hopefully CBD stenting with anesthesia assistance  [facility]  office [phone] After 5PM or if no answer call [phone]

## 2020-03-05 NOTE — Anesthesia Preprocedure Evaluation (Signed)
Anesthesia Evaluation  Patient identified by MRN, date of birth, ID band Patient awake    Reviewed: Allergy & Precautions, H&P , NPO status , Patient's Chart, lab work & pertinent test results, reviewed documented beta blocker date and time   History of Anesthesia Complications Negative for: history of anesthetic complications  Airway Mallampati: II  TM Distance: >3 FB Neck ROM: full    Dental  (+) Teeth Intact   Pulmonary neg pulmonary ROS, former smoker,    Pulmonary exam normal        Cardiovascular hypertension, Pt. on medications and Pt. on home beta blockers + CAD, + Past MI (2011) and + Cardiac Stents  Normal cardiovascular exam     Neuro/Psych negative neurological ROS  negative psych ROS   GI/Hepatic Neg liver ROS, Pancreatic mass, jaundice   Endo/Other  negative endocrine ROS  Renal/GU negative Renal ROS  negative genitourinary   Musculoskeletal negative musculoskeletal ROS (+)   Abdominal   Peds  Hematology negative hematology ROS (+)   Anesthesia Other Findings   Reproductive/Obstetrics                             Anesthesia Physical  Anesthesia Plan  ASA: III  Anesthesia Plan: General   Post-op Pain Management:    Induction: Intravenous  PONV Risk Score and Plan: 3 and Ondansetron, Dexamethasone, Midazolam and Treatment may vary due to age or medical condition  Airway Management Planned: Oral ETT  Additional Equipment: None  Intra-op Plan:   Post-operative Plan: Extubation in OR  Informed Consent: I have reviewed the patients History and Physical, chart, labs and discussed the procedure including the risks, benefits and alternatives for the proposed anesthesia with the patient or authorized representative who has indicated his/her understanding and acceptance.     Dental advisory given  Plan Discussed with:   Anesthesia Plan Comments:          Anesthesia Quick Evaluation  

## 2020-03-05 NOTE — Discharge Instructions (Signed)
YOU HAD AN ENDOSCOPIC PROCEDURE TODAY: Refer to the procedure report and other information in the discharge instructions given to you for any specific questions about what was found during the examination. If this information does not answer your questions, please call [facility] office at [phone] to clarify.   YOU SHOULD EXPECT: Some feelings of bloating in the abdomen. Passage of more gas than usual. Walking can help get rid of the air that was put into your GI tract during the procedure and reduce the bloating. If you had a lower endoscopy (such as a colonoscopy or flexible sigmoidoscopy) you may notice spotting of blood in your stool or on the toilet paper. Some abdominal soreness may be present for a day or two, also.  DIET: Clear liquids only until 8 PM and if doing well then may have soft solids and your first meal following the procedure should be a light meal and then it is ok to progress to your normal diet. A half-sandwich or bowl of soup is an example of a good first meal. Heavy or fried foods are harder to digest and may make you feel nauseous or bloated. Drink plenty of fluids but you should avoid alcoholic beverages for 24 hours. If you had a esophageal dilation, please see attached instructions for diet.    ACTIVITY: Your care partner should take you home directly after the procedure. You should plan to take it easy, moving slowly for the rest of the day. You can resume normal activity the day after the procedure however YOU SHOULD NOT DRIVE, use power tools, machinery or perform tasks that involve climbing or major physical exertion for 24 hours (because of the sedation medicines used during the test).   SYMPTOMS TO REPORT IMMEDIATELY: A gastroenterologist can be reached at any hour. Please call [phone]  for any of the following symptoms:  . Following lower endoscopy (colonoscopy, flexible sigmoidoscopy) Excessive amounts of blood in the stool  Significant tenderness, worsening  of abdominal pains  Swelling of the abdomen that is new, acute  Fever of 100 or higher  . Following upper endoscopy (EGD, EUS, ERCP, esophageal dilation) Vomiting of blood or coffee ground material  New, significant abdominal pain  New, significant chest pain or pain under the shoulder blades  Painful or persistently difficult swallowing  New shortness of breath  Black, tarry-looking or red, bloody stools  FOLLOW UP:  If any biopsies were taken you will be contacted by phone or by letter within the next 1-3 weeks. Call [phone]  if you have not heard about the biopsies in 3 weeks.  Please also call with any specific questions about appointments or follow up tests.  No biopsies were done but call if question or problem otherwise keep your appointment with your oncologist and make sure that they repeat liver tests on their follow-up

## 2020-03-05 NOTE — Anesthesia Procedure Notes (Signed)
Procedure Name: Intubation Date/Time: 03/05/2020 1:32 PM Performed by: Sharlette Dense, CRNA Patient Re-evaluated:Patient Re-evaluated prior to induction Oxygen Delivery Method: Circle system utilized Preoxygenation: Pre-oxygenation with 100% oxygen Induction Type: IV induction Ventilation: Mask ventilation without difficulty Laryngoscope Size: Miller and 2 Grade View: Grade I Tube type: Oral Tube size: 7.0 mm Number of attempts: 1 Airway Equipment and Method: Stylet Placement Confirmation: ETT inserted through vocal cords under direct vision,  positive ETCO2 and breath sounds checked- equal and bilateral Secured at: 21 cm Tube secured with: Tape Dental Injury: Teeth and Oropharynx as per pre-operative assessment

## 2020-03-05 NOTE — Anesthesia Postprocedure Evaluation (Signed)
Anesthesia Post Note  Patient: Vicki [name]  Procedure(s) Performed: ENDOSCOPIC RETROGRADE CHOLANGIOPANCREATOGRAPHY (ERCP) (N/A ) SPHINCTEROTOMY BILIARY STENT PLACEMENT (N/A )     Patient location during evaluation: PACU Anesthesia Type: General Level of consciousness: awake and alert and oriented Pain management: pain level controlled Vital Signs Assessment: post-procedure vital signs reviewed and stable Respiratory status: spontaneous breathing, nonlabored ventilation and respiratory function stable Cardiovascular status: blood pressure returned to baseline and stable Anesthetic complications: no    Last Vitals:  Vitals:   03/05/20 1450 03/05/20 1500  BP: (!) 162/79 (!) 166/73  Pulse: (!) 58 60  Resp: 17 14  Temp:    SpO2: 93% 92%    Last Pain:  Vitals:   03/05/20 1432  TempSrc: Oral  PainSc: 0-No pain                 Gwendolyn Nishi A.

## 2020-03-05 NOTE — Op Note (Signed)
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 Estimated Blood Loss:     Estimated blood loss: none. Procedure:                Pre-Anesthesia Assessment:                           - Prior to the procedure, a History and Physical                            was performed, and patient medications and                            allergies were reviewed. The patient's tolerance of                            previous anesthesia was also reviewed. The risks                            and benefits of the procedure and the sedation                            options and risks were discussed with the patient.                            All questions were answered, and informed consent                            was obtained. Prior Anticoagulants: The patient has                            taken no previous anticoagulant or antiplatelet                            agents. ASA Grade Assessment: II - A patient with                            mild systemic disease.  After reviewing the risks                            and benefits, the patient was deemed in                            satisfactory condition to undergo the procedure.                           After obtaining informed consent, the scope was                            passed under direct vision. Throughout the                            procedure, the patient's blood pressure, pulse, and   oxygen saturations were monitored continuously. The                            [id number] Olympus was introduced through                            the mouth, and used to inject contrast into and                            used to cannulate the bile duct. The ERCP was                            accomplished without difficulty. The patient                            tolerated the procedure well. Scope In: Scope Out: Findings:      The major papilla was normal. There was no signs of the previously       placed pancreatic stent and deep selective cannulation of the CBD was       fairly readily obtained and an obvious short lower duct stricture was       confirmed on initial cholangiogram and we proceeded with a biliary       sphincterotomy which was made with a Hydratome sphincterotome using ERBE       electrocautery. There was no post-sphincterotomy bleeding. We did a       small to medium size sphincterotomy until we had some biliary drainage       and then we placed one 10 Fr by 6 cm uncovered metal stent with no       external flaps and no internal flaps was placed 5.5 cm into the common       bile duct. Bile flowed through the stent. The stent was in good       position. There was no obvious pancreatic duct injection and possibly       the wire went minimally towards the pancreas one time Impression:               - The major papilla appeared normal. The previously  placed stent was no longer visible                           - A biliary sphincterotomy was performed.                           - One uncovered metal stent was placed into the                            common bile duct. Moderate Sedation:      Not Applicable - Patient had care per Anesthesia. Recommendation:           - Clear liquid diet for 6 hours. Then slowly                            advance as tolerated                           - Continue present medications.                            - Return to GI clinic PRN. Will have oncology                            repeat labs next week on their consultation                           - Telephone GI clinic if symptomatic PRN. Procedure Code(s):        --- Professional ---                           [phone], Endoscopic retrograde                            cholangiopancreatography (ERCP); with placement of                            endoscopic stent into biliary or pancreatic duct,                            including pre- and post-dilation and guide wire                            passage, when performed, including sphincterotomy,                            when performed, each stent Diagnosis Code(s):        --- Professional ---                           C25.0, Malignant neoplasm of head of pancreas CPT copyright 2019 American Medical Association. All rights reserved. The codes documented in this report are preliminary and upon coder review may  be revised to meet current compliance requirements. Clarene Essex, MD 03/05/2020 2:16:50 PM This report has been signed electronically. Number of Addenda: 0

## 2020-03-12 ENCOUNTER — Other Ambulatory Visit: Payer: Self-pay

## 2020-03-12 ENCOUNTER — Telehealth: Payer: Self-pay | Admitting: Oncology

## 2020-03-12 ENCOUNTER — Inpatient Hospital Stay: Payer: 59 | Attending: Oncology | Admitting: Oncology

## 2020-03-12 ENCOUNTER — Inpatient Hospital Stay: Payer: 59

## 2020-03-12 VITALS — BP 139/74 | HR 65 | Temp 97.8°F | Resp 18 | Ht 66.0 in | Wt 152.3 lb

## 2020-03-12 DIAGNOSIS — Z803 Family history of malignant neoplasm of breast: Secondary | ICD-10-CM | POA: Diagnosis not present

## 2020-03-12 DIAGNOSIS — Z720 Tobacco use: Secondary | ICD-10-CM | POA: Diagnosis not present

## 2020-03-12 DIAGNOSIS — C259 Malignant neoplasm of pancreas, unspecified: Secondary | ICD-10-CM

## 2020-03-12 DIAGNOSIS — Z5111 Encounter for antineoplastic chemotherapy: Secondary | ICD-10-CM | POA: Insufficient documentation

## 2020-03-12 DIAGNOSIS — R599 Enlarged lymph nodes, unspecified: Secondary | ICD-10-CM | POA: Diagnosis not present

## 2020-03-12 DIAGNOSIS — I2510 Atherosclerotic heart disease of native coronary artery without angina pectoris: Secondary | ICD-10-CM | POA: Diagnosis not present

## 2020-03-12 DIAGNOSIS — G893 Neoplasm related pain (acute) (chronic): Secondary | ICD-10-CM

## 2020-03-12 DIAGNOSIS — R634 Abnormal weight loss: Secondary | ICD-10-CM | POA: Diagnosis not present

## 2020-03-12 DIAGNOSIS — C250 Malignant neoplasm of head of pancreas: Secondary | ICD-10-CM

## 2020-03-12 DIAGNOSIS — R630 Anorexia: Secondary | ICD-10-CM

## 2020-03-12 LAB — CMP (CANCER CENTER ONLY)
ALT: 111 U/L — ABNORMAL HIGH (ref 0–44)
AST: 40 U/L (ref 15–41)
Albumin: 3.7 g/dL (ref 3.5–5.0)
Alkaline Phosphatase: 329 U/L — ABNORMAL HIGH (ref 38–126)
Anion gap: 11 (ref 5–15)
BUN: 9 mg/dL (ref 8–23)
CO2: 25 mmol/L (ref 22–32)
Calcium: 9.5 mg/dL (ref 8.9–10.3)
Chloride: 104 mmol/L (ref 98–111)
Creatinine: 0.82 mg/dL (ref 0.44–1.00)
GFR, Est AFR Am: >60 mL/min (ref >60)
GFR, Estimated: >60 mL/min (ref >60)
Glucose, Bld: 93 mg/dL (ref 70–99)
Potassium: 3.9 mmol/L (ref 3.5–5.1)
Sodium: 140 mmol/L (ref 135–145)
Total Bilirubin: 2 mg/dL — ABNORMAL HIGH (ref 0.3–1.2)
Total Protein: 7.6 g/dL (ref 6.5–8.1)

## 2020-03-12 LAB — CBC WITH DIFFERENTIAL (CANCER CENTER ONLY)
Abs Immature Granulocytes: 0.06 10*3/uL (ref 0.00–0.07)
Basophils Absolute: 0.1 10*3/uL (ref 0.0–0.1)
Basophils Relative: 0 %
Eosinophils Absolute: 0.3 10*3/uL (ref 0.0–0.5)
Eosinophils Relative: 2 %
HCT: 35.6 % — ABNORMAL LOW (ref 36.0–46.0)
Hemoglobin: 11.5 g/dL — ABNORMAL LOW (ref 12.0–15.0)
Immature Granulocytes: 1 %
Lymphocytes Relative: 22 %
Lymphs Abs: 2.8 10*3/uL (ref 0.7–4.0)
MCH: 30.4 pg (ref 26.0–34.0)
MCHC: 32.3 g/dL (ref 30.0–36.0)
MCV: 94.2 fL (ref 80.0–100.0)
Monocytes Absolute: 0.8 10*3/uL (ref 0.1–1.0)
Monocytes Relative: 6 %
Neutro Abs: 9 10*3/uL — ABNORMAL HIGH (ref 1.7–7.7)
Neutrophils Relative %: 69 %
Platelet Count: 451 10*3/uL — ABNORMAL HIGH (ref 150–400)
RBC: 3.78 MIL/uL — ABNORMAL LOW (ref 3.87–5.11)
RDW: 14 % (ref 11.5–15.5)
WBC Count: 13 10*3/uL — ABNORMAL HIGH (ref 4.0–10.5)
nRBC: 0 % (ref 0.0–0.2)

## 2020-03-12 MED ORDER — OXYCODONE-ACETAMINOPHEN 10-325 MG PO TABS: 1.0000 | ORAL_TABLET | ORAL | 0 refills | Status: DC | PRN

## 2020-03-12 MED ORDER — OXYCODONE-ACETAMINOPHEN 5-325 MG PO TABS
ORAL_TABLET | ORAL | Status: AC
  Filled 2020-03-12: qty 1

## 2020-03-12 MED ORDER — OXYCODONE-ACETAMINOPHEN 5-325 MG PO TABS
1.0000 | ORAL_TABLET | Freq: Once | ORAL | Status: AC
  Administered 2020-03-12: 1 via ORAL

## 2020-03-12 MED ORDER — ONDANSETRON HCL 8 MG PO TABS: 8.0000 mg | ORAL_TABLET | Freq: Three times a day (TID) | ORAL | 1 refills | Status: DC | PRN

## 2020-03-12 NOTE — Progress Notes (Signed)
Met with patient and her husband Vicki [name] at their initial medical oncology appointment today with Dr. Benay Spice.  I had spoken with the patient previously regarding my role as GI nurse navigator and reiterated this with her husband.  They were given my business card with my direct phone number and encouraged to call with any questions or concerns.  I have made a referral to our nutritionist for a consult.    They both verbalized an understanding that the plan is to proceed with obtaining a CT of chest, abdomen and pelvis with contrast.  She was given two bottles of oral prep to take with them today and will be given instructions once we have this scheduled.  We will also make a referral to Dr. Barry Dienes for surgical consult and schedule a follow up after her scans with Dr. Benay Spice.  I escorted them to the lab where we will obtain some baseline labs today.  I have also requested lab results from [facility] be faxed to [place] for comparison.

## 2020-03-12 NOTE — Telephone Encounter (Signed)
Scheduled appt per 4/13 sch  Message - pt is aware of appt date and time

## 2020-03-12 NOTE — Progress Notes (Signed)
Vicki [name]   Requesting NL:[id number] Magod   Vicki [name] 64 y.o.  December 06, 1955    Reason for [name]: Pancreas cancer   HPI: Vicki [name] reports abdominal pain beginning approximately 1 year ago after she had "pneumonia ".  The pain varies in intensity and has increased over the past several months.  The pain is in the abdomen and back. She saw Dr. Penelope Coop and was referred for an abdominal ultrasound on 01/31/2020.  The pancreatic duct appeared prominent at 4 mm.  No focal pancreatic abnormality. An MRI of the abdomen on 02/21/2020 revealed no suspicious liver lesion.  Mild intrahepatic/extrahepatic ductal dilatation with the distal common duct measuring 10 mm.  The distal duct is abruptly truncated at the pancreas head.  A 2.2 x 3.3 cm hypoenhancing mass is noted in the posterior pancreas head/uncinate suspicious for neoplasm.  The main pancreatic duct is dilated in the body/tail.  No evidence of vascular invasion.  Small lymph nodes in the porta hepatis not well visualized.  No ascites.  She was referred to Dr. Paulita Fujita and underwent an EUS on 02/29/2020.  1 abnormal lymph node was noted in the peripancreatic region.  A 20 x 23 mm mass was noted in the pancreas head.  A fine-needle aspiration was performed.  The tumor was staged as a T3N1 by endoscopic ultrasound.  The cytology revealed malignant cells consistent with adenocarcinoma.  An ERCP was performed on 02/29/2020.  A wire was passed into the ventral pancreatic duct.  A temporary stent was placed in the pancreatic duct.  A sphincterotomy was performed the bile duct could not be cannulated with the sphincterotome.  She was taken to the repeat ERCP procedure by Dr. Watt Climes on 03/05/2020.  The pancreatic stent was not visible.  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  A uncovered metal stent was placed in the common bile duct.  Vicki [name] is referred for oncology evaluation.  Past Medical History:  Diagnosis Date  .  CAD (coronary artery disease) 2011   with stent to RCA and 60 % LAD disease  . Clotting disorder ([place])   . H/O cardiac arrest 02/15/10   with STEMI- Inf wall  . Hyperlipidemia   . Hypertension   . Myocardial infarct ([place]) 02/15/10  . Shock, cardiogenic ([place]) 01/2010   with MI, IABP    .  G2 P2  Past Surgical History:  Procedure Laterality Date  . ABDOMINAL HYSTERECTOMY  1987   BSO  . APPENDECTOMY  1987  . BILIARY STENT PLACEMENT N/A 03/05/2020   Procedure: BILIARY STENT PLACEMENT;  Surgeon: Clarene Essex, MD;  Location: [facility];  Service: Endoscopy;  Laterality: N/A;  . CORONARY ANGIOPLASTY WITH STENT PLACEMENT  02/15/10   Stent to prox RCA -BMS  . DOPPLER ECHOCARDIOGRAPHY  05/06/2010   EF =50-55%  lvfx low normal ;no sigificant valvular disease seen  . ENDOSCOPIC RETROGRADE CHOLANGIOPANCREATOGRAPHY (ERCP) WITH PROPOFOL N/A 02/29/2020   Procedure: ENDOSCOPIC RETROGRADE CHOLANGIOPANCREATOGRAPHY (ERCP) WITH PROPOFOL;  Surgeon: Arta Silence, MD;  Location: [facility];  Service: Endoscopy;  Laterality: N/A;  . ERCP N/A 03/05/2020   Procedure: ENDOSCOPIC RETROGRADE CHOLANGIOPANCREATOGRAPHY (ERCP);  Surgeon: Clarene Essex, MD;  Location: [facility];  Service: Endoscopy;  Laterality: N/A;  with stent placement  . ESOPHAGOGASTRODUODENOSCOPY (EGD) WITH PROPOFOL N/A 02/29/2020   Procedure: ESOPHAGOGASTRODUODENOSCOPY (EGD) WITH PROPOFOL;  Surgeon: Arta Silence, MD;  Location: [facility];  Service: Endoscopy;  Laterality: N/A;  . EXPLORATORY LAPAROTOMY    . FINE  NEEDLE ASPIRATION  02/29/2020   Procedure: FINE NEEDLE ASPIRATION;  Surgeon: Arta Silence, MD;  Location: [facility];  Service: Endoscopy;;  . [place]   double bilateral  . MASS EXCISION  09/02/2012   Procedure: EXCISION MASS;  Surgeon: Earnstine Regal, MD;  Location: [facility];  Service: General;  Laterality: Left;  Excise mass Left posterior neck  . NM MYOCAR PERF WALL MOTION  05/06/2010   exercise cap  8 mets. ,mild perfusion defect in basal infer.,mid infer., apical infer., region consistent with infarct/scar. no significant ischemia  . PANCREATIC STENT PLACEMENT  02/29/2020   Procedure: PANCREATIC STENT PLACEMENT;  Surgeon: Arta Silence, MD;  Location: [facility];  Service: Endoscopy;;  . right chest wall exploration    . SPHINCTEROTOMY  02/29/2020   Procedure: SPHINCTEROTOMY;  Surgeon: Arta Silence, MD;  Location: [facility];  Service: Endoscopy;;  . [name]  03/05/2020   Procedure: [name];  Surgeon: Clarene Essex, MD;  Location: [facility];  Service: Endoscopy;;  . UPPER ESOPHAGEAL ENDOSCOPIC ULTRASOUND (EUS) N/A 02/29/2020   Procedure: UPPER ESOPHAGEAL ENDOSCOPIC ULTRASOUND (EUS)  LINEAR SCOPE;  Surgeon: Arta Silence, MD;  Location: [facility];  Service: Endoscopy;  Laterality: N/A;  DR. NEEDS TWO HOURS FOR PROCEDURE    Medications: Reviewed  Allergies: No Known Allergies  Family history: Her mother had breast cancer at age 23.  No other family history of cancer.  Social History:   She lives with her husband in [place].  She works as an Glass blower/designer.  She smokes cigarettes occasionally.  She reports moderate alcohol use.  No transfusion history.  No risk factor for HIV or hepatitis.  ROS:   Positives include: Anorexia, 20 pound weight loss, nausea, constipation since starting pain medication, abdomen/back pain relieved with oxycodone, pruritus, jaundice prior to placement of the bile duct stent-improved, dark urine, light-colored stool prior to placement of the bile duct stent  A complete ROS was otherwise negative.  Physical Exam:  Blood pressure 139/74, pulse 65, temperature 97.8 F (36.6 C), temperature source Oral, resp. rate 18, height '5\' 6"'  (1.676 m), weight 152 lb 4.8 oz (69.1 kg), SpO2 100 %.  HEENT: Sclera anicteric Lungs: Mild bilateral wheezing, no respiratory distress Cardiac: Regular rate and rhythm Abdomen: Tender in the mid upper  abdomen, no mass, no apparent ascites, no hepatosplenomegaly  Vascular: No leg edema Lymph nodes: No cervical, supraclavicular, axillary, or inguinal nodes Neurologic: Alert and oriented, motor exam appears intact in the upper and lower extremities bilaterally Skin: No rash Musculoskeletal: No spine tenderness   LAB:  CBC  Lab Results  Component Value Date   WBC 7.4 02/13/2019   HGB 12.7 02/13/2019   HCT 37.4 02/13/2019   MCV 89.9 02/13/2019   PLT 404.0 (H) 02/13/2019   NEUTROABS 4.3 02/13/2019        CMP  Lab Results  Component Value Date   NA 137 02/13/2019   K 4.5 02/13/2019   CL 103 02/13/2019   CO2 25 02/13/2019   GLUCOSE 100 (H) 02/13/2019   BUN 11 02/13/2019   CREATININE 0.81 02/13/2019   CALCIUM 9.7 02/13/2019   PROT 7.3 02/13/2019   ALBUMIN 4.2 02/13/2019   AST 20 02/13/2019   ALT 19 02/13/2019   ALKPHOS 105 02/13/2019   BILITOT 0.5 02/13/2019   GFRNONAA 86.34 06/19/2010   GFRAA  03/03/2010    >60        The eGFR has been calculated using the MDRD equation. This calculation  has not been validated in all clinical situations. eGFR's persistently <60 mL/min signify possible Chronic Kidney Disease.     Imaging:  As per HPI   Assessment/Plan:   1. 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  MRI abdomen 02/01/2020-2.2 x 3.3 cm mass in the posterior pancreas head/uncinate process, mild intrahepatic/extrahepatic ductal dilatation, no evidence of vascular invasion, possible small lymph nodes in the porta hepatis-poorly visualized  EUS 02/29/2020-20 x 23 mm mass in the pancreas head, upstream pancreatic duct dilatation, 1 abnormal peripancreatic node, uT3?uN1  ERCP 03/05/2020-common bile duct stricture, uncovered metal stent placed  2.   Abdomen/back pain secondary to #1 3.   Anorexia/weight loss secondary to #1 4.   History of coronary artery disease,STEMI with the fib arrest 2011, status post RCA  stent   Disposition:   Vicki [name] has been diagnosed with pancreas cancer after presenting with abdomen/back pain and obstructive jaundice.  She has undergone placement of a palliative bile duct stent.  I discuss the staging and treatment of pancreas cancer with Vicki [name] and her husband.  She understands surgery is the only potentially curative therapy.  The tumor appears resectable based on the staging evaluation to date.  I will refer her for a staging chest CT and pancreas protocol CT abdomen/pelvis.  We obtained a CA 19-9 today.  Her case will be presented at the GI tumor conference on 03/20/2020.  We will refer her to Dr. Barry Dienes.  Neoadjuvant chemotherapy will most likely be recommended if the staging evaluation confirms a resectable tumor.  We will consider the gemcitabine/Abraxane and FOLFIRINOX regimens.  We will refer her for genetic testing.  I refilled the prescription for oxycodone.  We will start a long-acting narcotic if she continues require frequent oxycodone.  Betsy Coder, MD  03/12/2020, 2:28 PM

## 2020-03-13 ENCOUNTER — Telehealth: Payer: Self-pay | Admitting: Oncology

## 2020-03-13 ENCOUNTER — Encounter: Payer: Self-pay | Admitting: General Practice

## 2020-03-13 LAB — CANCER ANTIGEN 19-9: CA 19-9: 120 U/mL — ABNORMAL HIGH (ref 0–35)

## 2020-03-13 NOTE — Progress Notes (Signed)
Patient calls for clarification of her appointments and the prep involved for CT scan.  I verified her appointments.  Instructed her to drink one bottle of oral prep by 5:30 and the second bottle by 6:30 for CT scan on 4/16 at 7:30.  NPO 4 hours prior to the scan.  She understands her follow up with Dr. Benay Spice will be 4/21 at 10:00 am.   All her questions were answered.

## 2020-03-13 NOTE — Progress Notes (Signed)
[facility] Psychosocial Distress Screening Clinical Social Work  Clinical Social Work was referred by distress screening protocol.  The patient scored a 5 on the Psychosocial Distress Thermometer which indicates moderate distress. Clinical Social Worker contacted patient by phone to assess for distress and other psychosocial needs. Spoke w patient - "I am doing incredibly well.  I have a high tolerance for pain, my body handles medications differently."  "Most important, I have an incredible amount of faith, patience, belief."  "No doubt in my mind, regardless of the outcomes, I know God is in control and I win in the end."  She has many strong family and faith supports, expresses confidence in her treatment team.  CSW and patient discussed common feeling and emotions when being diagnosed with cancer, and the importance of support during treatment. CSW informed patient of the support team and support services at [facility]. CSW provided contact information and encouraged patient to call with any questions or concerns.  ONCBCN DISTRESS SCREENING 03/12/2020  Screening Type Initial Screening  Distress experienced in past week (1-10) 5  Emotional problem type Adjusting to illness  Physical Problem type Pain  Physician notified of physical symptoms Yes  Referral to clinical psychology No  Referral to clinical social work Yes  Referral to dietition Yes  Referral to financial advocate No  Referral to support programs No  Referral to palliative care No    Clinical Social Worker follow up needed: No.  If yes, follow up plan:  Beverely Pace, [place], LCSW Clinical Social Worker Phone:  [phone]

## 2020-03-13 NOTE — Telephone Encounter (Signed)
Scheduled appt per 4/14 sch message - unable to reach pt  - left message with appt date and time

## 2020-03-14 ENCOUNTER — Inpatient Hospital Stay: Payer: 59 | Admitting: Nutrition

## 2020-03-14 NOTE — Progress Notes (Signed)
64 year old female diagnosed with pancreas cancer.  She is a patient of Dr. Benay Spice. Her treatment plan is pending.  Past medical history includes CAD, hyperlipidemia, hypertension, and MI.  Medications include Xanax, vitamin D, omega-3 fatty acid, Zofran, Protonix, MiraLAX, K-Dur, and Crestor.  Labs were reviewed.  Height: 66 inches. Weight: 152.3 pounds. Patient weighed 167.8 pounds December 2020. BMI: 24.58.  Patient reports her appetite is improving. Her pain is reduced significantly after stents were put in. She denies nausea and vomiting but does have some gagging and some food aversion. Constipation has improved on MiraLAX. Is consuming some protein shakes.  Nutrition diagnosis: Unintended weight loss related to new diagnosis of cancer and associated treatments as evidenced by 9% weight loss over 3 months.  This is significant.  Intervention: Educated patient to consume small, frequent meals and snacks with higher calorie, higher protein foods. Reviewed importance of a bowel regimen. Encouraged increased fluids. Recommended avoiding greasy and fried foods secondary to intolerance. Emailed nutrition fact sheets.  Questions were answered.  Teach back used.  Contact information has been given.  Monitoring, evaluation, goals: Patient will tolerate increased calories and protein to promote maintenance of lean body mass.  Next visit: To be scheduled with treatment as needed.  **Disclaimer: This note was dictated with voice recognition software. Similar sounding words can inadvertently be transcribed and this note may contain transcription errors which may not have been corrected upon publication of note.**

## 2020-03-15 ENCOUNTER — Ambulatory Visit (HOSPITAL_COMMUNITY): Payer: 59

## 2020-03-15 ENCOUNTER — Other Ambulatory Visit: Payer: Self-pay | Admitting: Cardiovascular Disease

## 2020-03-19 ENCOUNTER — Encounter (HOSPITAL_COMMUNITY): Payer: Self-pay

## 2020-03-19 ENCOUNTER — Other Ambulatory Visit: Payer: Self-pay | Admitting: Genetic Counselor

## 2020-03-19 ENCOUNTER — Other Ambulatory Visit: Payer: Self-pay

## 2020-03-19 ENCOUNTER — Ambulatory Visit (HOSPITAL_COMMUNITY)
Admission: RE | Admit: 2020-03-19 | Discharge: 2020-03-19 | Disposition: A | Discharge status: Home / Self Care | Payer: 59 | Source: Ambulatory Visit | Attending: Oncology | Admitting: Oncology

## 2020-03-19 DIAGNOSIS — C259 Malignant neoplasm of pancreas, unspecified: Secondary | ICD-10-CM

## 2020-03-19 HISTORY — DX: Malignant (primary) neoplasm, unspecified: C80.1

## 2020-03-19 LAB — SURGICAL PATHOLOGY

## 2020-03-19 MED ORDER — IOHEXOL 300 MG/ML  SOLN
100.0000 mL | Freq: Once | INTRAMUSCULAR | Status: AC | PRN
  Administered 2020-03-19: 100 mL via INTRAVENOUS

## 2020-03-19 MED ORDER — SODIUM CHLORIDE (PF) 0.9 % IJ SOLN
INTRAMUSCULAR | Status: AC
  Filled 2020-03-19: qty 50

## 2020-03-20 ENCOUNTER — Inpatient Hospital Stay (HOSPITAL_BASED_OUTPATIENT_CLINIC_OR_DEPARTMENT_OTHER): Payer: 59 | Admitting: Genetic Counselor

## 2020-03-20 ENCOUNTER — Other Ambulatory Visit: Payer: Self-pay

## 2020-03-20 ENCOUNTER — Inpatient Hospital Stay (HOSPITAL_BASED_OUTPATIENT_CLINIC_OR_DEPARTMENT_OTHER): Payer: 59 | Admitting: Oncology

## 2020-03-20 ENCOUNTER — Encounter: Payer: Self-pay | Admitting: *Deleted

## 2020-03-20 ENCOUNTER — Inpatient Hospital Stay: Payer: 59

## 2020-03-20 ENCOUNTER — Encounter: Payer: Self-pay | Admitting: Genetic Counselor

## 2020-03-20 VITALS — BP 132/65 | HR 61 | Temp 98.2°F | Resp 18 | Ht 66.0 in | Wt 152.6 lb

## 2020-03-20 DIAGNOSIS — Z5111 Encounter for antineoplastic chemotherapy: Secondary | ICD-10-CM | POA: Diagnosis not present

## 2020-03-20 DIAGNOSIS — C250 Malignant neoplasm of head of pancreas: Secondary | ICD-10-CM

## 2020-03-20 DIAGNOSIS — Z7189 Other specified counseling: Secondary | ICD-10-CM | POA: Diagnosis not present

## 2020-03-20 DIAGNOSIS — Z803 Family history of malignant neoplasm of breast: Secondary | ICD-10-CM | POA: Diagnosis not present

## 2020-03-20 DIAGNOSIS — C259 Malignant neoplasm of pancreas, unspecified: Secondary | ICD-10-CM

## 2020-03-20 DIAGNOSIS — Z8349 Family history of other endocrine, nutritional and metabolic diseases: Secondary | ICD-10-CM

## 2020-03-20 LAB — GENETIC SCREENING ORDER

## 2020-03-20 MED ORDER — MORPHINE SULFATE ER 15 MG PO TBCR: 15.0000 mg | EXTENDED_RELEASE_TABLET | Freq: Two times a day (BID) | ORAL | 0 refills | Status: DC

## 2020-03-20 NOTE — Progress Notes (Signed)
[place] OFFICE PROGRESS NOTE   Diagnosis: Pancreas cancer  INTERVAL HISTORY:   Vicki [name] returns as scheduled.  She continues to have abdomen/back pain.  She takes approximately 5 oxycodone tablets per day.  The pain is partially relieved with oxycodone.  Objective:  Vital signs in last 24 hours:  Blood pressure 132/65, pulse 61, temperature 98.2 F (36.8 C), temperature source Temporal, resp. rate 18, height 5\' 6"  (1.676 m), weight 152 lb 9.6 oz (69.2 kg), SpO2 99 %.    GI: No hepatosplenomegaly, no mass, tender in the mid upper abdomen Vascular: No leg edema   Lab Results:  Lab Results  Component Value Date   WBC 13.0 (H) 03/12/2020   HGB 11.5 (L) 03/12/2020   HCT 35.6 (L) 03/12/2020   MCV 94.2 03/12/2020   PLT 451 (H) 03/12/2020   NEUTROABS 9.0 (H) 03/12/2020    CMP  Lab Results  Component Value Date   NA 140 03/12/2020   K 3.9 03/12/2020   CL 104 03/12/2020   CO2 25 03/12/2020   GLUCOSE 93 03/12/2020   BUN 9 03/12/2020   CREATININE 0.82 03/12/2020   CALCIUM 9.5 03/12/2020   PROT 7.6 03/12/2020   ALBUMIN 3.7 03/12/2020   AST 40 03/12/2020   ALT 111 (H) 03/12/2020   ALKPHOS 329 (H) 03/12/2020   BILITOT 2.0 (H) 03/12/2020   GFRNONAA >60 03/12/2020   GFRAA >60 03/12/2020    No results found for: CEA1  Lab Results  Component Value Date   INR 1.69 (H) 02/16/2010    Imaging:  CT CHEST W CONTRAST  Result Date: 03/19/2020 CLINICAL DATA:  64 year old female with history of pancreatic cancer. 20 pound weight loss since January 2021. Mid abdominal pain radiating to the back since March 2020. EXAM: CT CHEST, ABDOMEN, AND PELVIS WITH CONTRAST TECHNIQUE: Multidetector CT imaging of the chest, abdomen and pelvis was performed following the standard protocol during bolus administration of intravenous contrast. CONTRAST:  138mL OMNIPAQUE IOHEXOL 300 MG/ML  SOLN COMPARISON:  Abdominal MRI 02/21/2020. FINDINGS: CT CHEST FINDINGS Cardiovascular:  Heart size is borderline enlarged. There is no significant pericardial fluid, thickening or pericardial calcification. There is aortic atherosclerosis, as well as atherosclerosis of the great vessels of the mediastinum and the coronary arteries, including calcified atherosclerotic plaque in the left main, left anterior descending, left circumflex and right coronary arteries. Mediastinum/Nodes: No pathologically enlarged mediastinal or hilar lymph nodes. Esophagus is unremarkable in appearance. No axillary lymphadenopathy. Lungs/Pleura: 4 mm pulmonary nodule in the superior segment of the left lower lobe (axial image 46 of series 12), nonspecific. No other larger more suspicious appearing pulmonary nodules or masses are noted. No acute consolidative airspace disease. No pleural effusions. Linear areas of scarring or subsegmental atelectasis in the right middle lobe and inferior segment of the lingula incidentally noted. Musculoskeletal: Chronic appearing compression fracture with 25% loss of anterior vertebral body height. Old healed fracture of the mid sternum. There are no aggressive appearing lytic or blastic lesions noted in the visualized portions of the skeleton. CT ABDOMEN PELVIS FINDINGS Hepatobiliary: No discrete cystic or solid hepatic lesions are confidently identified on today's examination. Gallbladder is unremarkable in appearance. Small amount of pneumobilia related to prior sphincterotomy. Common bile duct stent noted in the common bile duct. Pancreas: Previously noted hypovascular mass in the head and uncinate process of the pancreas is again noted (best appreciated on axial image 101 of series 7 and coronal image 49 of series 8) where this measures approximately  2.9 x 2.1 x 3.1 cm. This lesion comes in close proximity to the superior mesenteric artery with apparent contact with the proximal superior mesenteric artery from approximately 10:00 to 6:00 (axial image 107 of series 7). The anterior aspect  of the lesion also comes in close proximity to the splenoportal confluence, without definite contact or distortion of the splenoportal confluence or the proximal portal vein. The posterior and inferior aspect of the lesion comes in direct contact with the anterior surface of the abdominal aorta immediately below the level of the renal arteries. Dilatation of the main pancreatic duct which measures up to 8 mm in the body and head of the pancreas. Mild atrophy throughout the distal body and tail of the pancreas. Spleen: Unremarkable. Adrenals/Urinary Tract: Bilateral kidneys and adrenal glands are normal in appearance. No hydroureteronephrosis. Urinary bladder is normal in appearance. Stomach/Bowel: Normal appearance of the stomach. No pathologic dilatation of small bowel or colon. A few scattered colonic diverticulae are noted, most evident in the sigmoid colon, without surrounding inflammatory changes to suggest an acute diverticulitis at this time. The appendix is not confidently identified and may be surgically absent. Regardless, there are no inflammatory changes noted adjacent to the cecum to suggest the presence of an acute appendicitis at this time. Vascular/Lymphatic: Aortic atherosclerosis, without evidence of aneurysm or dissection in the abdominal or pelvic vasculature. Vascular findings pertinent to pancreatic mass, as detailed above. Numerous prominent borderline enlarged and mildly enlarged retroperitoneal lymph nodes are noted measuring up to 1.1 cm in short axis in the left para-aortic nodal station adjacent to the left renal hilum (axial image 108 of series 7). No other definite pathologically enlarged abdominal or pelvic lymph nodes are identified. Reproductive: Status post hysterectomy. Ovaries are not confidently identified may be surgically absent or atrophic. Other: No significant volume of ascites.  No pneumoperitoneum. Musculoskeletal: There are no aggressive appearing lytic or blastic lesions  noted in the visualized portions of the skeleton. IMPRESSION: 1. 2.9 x 2.1 x 3.1 cm hypovascular mass in the head and uncinate process of the pancreas, similar to prior abdominal MRI 02/21/2020. This is again associated with pancreatic ductal dilatation, and some retroperitoneal lymphadenopathy noted on today's examination, concerning for nodal spread of disease. The lesion is intimately associated with both the infrarenal abdominal aorta and the superior mesenteric artery, as detailed above. 2. Interval placement of common bile duct stent which appears appropriately located. 3. No definite evidence of metastatic disease in the lungs. There is a nonspecific 4 mm pulmonary nodule in the superior segment of the left lower lobe. Attention on follow-up studies is recommended to ensure stability. 4. Colonic diverticulosis without evidence of acute diverticulitis at this time. 5. Aortic atherosclerosis, in addition to left main and 3 vessel coronary artery disease. 6. Additional incidental findings, as above. Electronically Signed   By: Vinnie Langton M.D.   On: 03/19/2020 11:36   CT ABDOMEN PELVIS W CONTRAST  Result Date: 03/19/2020 CLINICAL DATA:  64 year old female with history of pancreatic cancer. 20 pound weight loss since January 2021. Mid abdominal pain radiating to the back since March 2020. EXAM: CT CHEST, ABDOMEN, AND PELVIS WITH CONTRAST TECHNIQUE: Multidetector CT imaging of the chest, abdomen and pelvis was performed following the standard protocol during bolus administration of intravenous contrast. CONTRAST:  12mL OMNIPAQUE IOHEXOL 300 MG/ML  SOLN COMPARISON:  Abdominal MRI 02/21/2020. FINDINGS: CT CHEST FINDINGS Cardiovascular: Heart size is borderline enlarged. There is no significant pericardial fluid, thickening or pericardial calcification. There is aortic  atherosclerosis, as well as atherosclerosis of the great vessels of the mediastinum and the coronary arteries, including calcified  atherosclerotic plaque in the left main, left anterior descending, left circumflex and right coronary arteries. Mediastinum/Nodes: No pathologically enlarged mediastinal or hilar lymph nodes. Esophagus is unremarkable in appearance. No axillary lymphadenopathy. Lungs/Pleura: 4 mm pulmonary nodule in the superior segment of the left lower lobe (axial image 46 of series 12), nonspecific. No other larger more suspicious appearing pulmonary nodules or masses are noted. No acute consolidative airspace disease. No pleural effusions. Linear areas of scarring or subsegmental atelectasis in the right middle lobe and inferior segment of the lingula incidentally noted. Musculoskeletal: Chronic appearing compression fracture with 25% loss of anterior vertebral body height. Old healed fracture of the mid sternum. There are no aggressive appearing lytic or blastic lesions noted in the visualized portions of the skeleton. CT ABDOMEN PELVIS FINDINGS Hepatobiliary: No discrete cystic or solid hepatic lesions are confidently identified on today's examination. Gallbladder is unremarkable in appearance. Small amount of pneumobilia related to prior sphincterotomy. Common bile duct stent noted in the common bile duct. Pancreas: Previously noted hypovascular mass in the head and uncinate process of the pancreas is again noted (best appreciated on axial image 101 of series 7 and coronal image 49 of series 8) where this measures approximately 2.9 x 2.1 x 3.1 cm. This lesion comes in close proximity to the superior mesenteric artery with apparent contact with the proximal superior mesenteric artery from approximately 10:00 to 6:00 (axial image 107 of series 7). The anterior aspect of the lesion also comes in close proximity to the splenoportal confluence, without definite contact or distortion of the splenoportal confluence or the proximal portal vein. The posterior and inferior aspect of the lesion comes in direct contact with the anterior  surface of the abdominal aorta immediately below the level of the renal arteries. Dilatation of the main pancreatic duct which measures up to 8 mm in the body and head of the pancreas. Mild atrophy throughout the distal body and tail of the pancreas. Spleen: Unremarkable. Adrenals/Urinary Tract: Bilateral kidneys and adrenal glands are normal in appearance. No hydroureteronephrosis. Urinary bladder is normal in appearance. Stomach/Bowel: Normal appearance of the stomach. No pathologic dilatation of small bowel or colon. A few scattered colonic diverticulae are noted, most evident in the sigmoid colon, without surrounding inflammatory changes to suggest an acute diverticulitis at this time. The appendix is not confidently identified and may be surgically absent. Regardless, there are no inflammatory changes noted adjacent to the cecum to suggest the presence of an acute appendicitis at this time. Vascular/Lymphatic: Aortic atherosclerosis, without evidence of aneurysm or dissection in the abdominal or pelvic vasculature. Vascular findings pertinent to pancreatic mass, as detailed above. Numerous prominent borderline enlarged and mildly enlarged retroperitoneal lymph nodes are noted measuring up to 1.1 cm in short axis in the left para-aortic nodal station adjacent to the left renal hilum (axial image 108 of series 7). No other definite pathologically enlarged abdominal or pelvic lymph nodes are identified. Reproductive: Status post hysterectomy. Ovaries are not confidently identified may be surgically absent or atrophic. Other: No significant volume of ascites.  No pneumoperitoneum. Musculoskeletal: There are no aggressive appearing lytic or blastic lesions noted in the visualized portions of the skeleton. IMPRESSION: 1. 2.9 x 2.1 x 3.1 cm hypovascular mass in the head and uncinate process of the pancreas, similar to prior abdominal MRI 02/21/2020. This is again associated with pancreatic ductal dilatation, and some  retroperitoneal lymphadenopathy  noted on today's examination, concerning for nodal spread of disease. The lesion is intimately associated with both the infrarenal abdominal aorta and the superior mesenteric artery, as detailed above. 2. Interval placement of common bile duct stent which appears appropriately located. 3. No definite evidence of metastatic disease in the lungs. There is a nonspecific 4 mm pulmonary nodule in the superior segment of the left lower lobe. Attention on follow-up studies is recommended to ensure stability. 4. Colonic diverticulosis without evidence of acute diverticulitis at this time. 5. Aortic atherosclerosis, in addition to left main and 3 vessel coronary artery disease. 6. Additional incidental findings, as above. Electronically Signed   By: Vinnie Langton M.D.   On: 03/19/2020 11:36    Medications: I have reviewed the patient's current medications.   Assessment/Plan: 1. 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   Abdomen/back pain secondary to #1 3.   Anorexia/weight loss secondary to #1 4.   History of coronary artery disease,STEMI with the fib arrest 2011, status post RCA stent   Disposition: Ms. has been diagnosed with  pancreas cancer.  The staging evaluation is consistent with unresectable disease.  She appears to have stage IV disease based on a distant retroperitoneal node.  Her case was presented at the GI tumor conference this morning.  The consensus recommendation is to proceed with systemic chemotherapy.  Dr. Barry Dienes feels she is not a candidate for resection.  I discussed the prognosis and treatment options with Vicki [name] and her husband.  Standard systemic treatment options include FOLFIRINOX and gemcitabine/Abraxane.  No therapy will be curative.  She appears to be a candidate for FOLFIRINOX.  This is the regimen with a highest published response rate.  The goals of chemotherapy is to improve her pain and lengthen survival.  We reviewed potential toxicities associated with the FOLFIRINOX regimen including the chance for nausea/vomiting, mucositis, diarrhea, alopecia, and hematologic toxicity.  We discussed the sun sensitivity, hand/foot syndrome, and hyperpigmentation associated with 5-fluorouracil.  We reviewed the acute and delayed diarrhea secondary to irinotecan.  We discussed the allergic reaction and various types of neuropathy associated with oxaliplatin.  She agrees to proceed.  She will attend a chemotherapy teaching class.  Ms. Bedonie will be referred for Port-A-Cath placement.  She will hold Plavix for the Port-A-Cath placement if okay with Dr. Gwenlyn Found.  I will refer her for a staging PET scan to further evaluate the retroperitoneal lymph nodes.  Her pain is poorly controlled at present.  We will add MS Contin and she will continue oxycodone for breakthrough pain.  Ms. Rosita Fire will return for an office visit and cycle 1 FOLFIRINOX on 03/27/2020.  Betsy Coder, MD  03/20/2020  10:15 AM

## 2020-03-20 NOTE — Progress Notes (Signed)
REFERRING PROVIDER: Ladell Pier, MD [address] [place],  [place] [address]  PRIMARY PROVIDER:  Midge Minium, MD  PRIMARY REASON FOR VISIT:  1. Malignant neoplasm of pancreas, unspecified location of malignancy ([place])   2. Family history of breast cancer   3. Family history of cystic fibrosis      HISTORY OF PRESENT ILLNESS:   Ms. Reposa, a 64 y.o. female, was seen for a [facility] cancer genetics consultation at the request of Dr. Benay Spice due to a personal and family history of cancer.  Ms. Mell presents to clinic today to discuss the possibility of a hereditary predisposition to cancer, genetic testing, and to further clarify her future cancer risks, as well as potential cancer risks for family members.   In March/April 2021, at the age of 43, Ms. Brion was diagnosed with pancreatic cancer. The treatment plan is unknown at this time. She reports that her son recently recovered from a significant bout of pancreatitis.  He has had several bouts in the past, but this was the most severe.     CANCER HISTORY:  Oncology History   No history exists.     RISK FACTORS:  Menarche was at age 75.  First live birth at age 38.  Ovaries intact: no.  Hysterectomy: yes.  Menopausal status: postmenopausal.  HRT use: 10 years. Colonoscopy: yes; normal. Mammogram within the last year: yes. Number of breast biopsies: 0. Up to date with pelvic exams: yes. Any excessive radiation exposure in the past: no  Past Medical History:  Diagnosis Date  . CAD (coronary artery disease) 2011   with stent to RCA and 60 % LAD disease  . Clotting disorder ([place])   . Family history of breast cancer   . Family history of cystic fibrosis   . H/O cardiac arrest 02/15/10   with STEMI- Inf wall  . Hyperlipidemia   . Hypertension   . Myocardial infarct ([place]) 02/15/10  . pancreatic ca dx'd 02/2020  . Shock, cardiogenic ([place]) 01/2010   with MI, IABP    Past Surgical History:  Procedure  Laterality Date  . ABDOMINAL HYSTERECTOMY  1987   BSO  . APPENDECTOMY  1987  . BILIARY STENT PLACEMENT N/A 03/05/2020   Procedure: BILIARY STENT PLACEMENT;  Surgeon: Clarene Essex, MD;  Location: [facility];  Service: Endoscopy;  Laterality: N/A;  . CORONARY ANGIOPLASTY WITH STENT PLACEMENT  02/15/10   Stent to prox RCA -BMS  . DOPPLER ECHOCARDIOGRAPHY  05/06/2010   EF =50-55%  lvfx low normal ;no sigificant valvular disease seen  . ENDOSCOPIC RETROGRADE CHOLANGIOPANCREATOGRAPHY (ERCP) WITH PROPOFOL N/A 02/29/2020   Procedure: ENDOSCOPIC RETROGRADE CHOLANGIOPANCREATOGRAPHY (ERCP) WITH PROPOFOL;  Surgeon: Arta Silence, MD;  Location: [facility];  Service: Endoscopy;  Laterality: N/A;  . ERCP N/A 03/05/2020   Procedure: ENDOSCOPIC RETROGRADE CHOLANGIOPANCREATOGRAPHY (ERCP);  Surgeon: Clarene Essex, MD;  Location: [facility];  Service: Endoscopy;  Laterality: N/A;  with stent placement  . ESOPHAGOGASTRODUODENOSCOPY (EGD) WITH PROPOFOL N/A 02/29/2020   Procedure: ESOPHAGOGASTRODUODENOSCOPY (EGD) WITH PROPOFOL;  Surgeon: Arta Silence, MD;  Location: [facility];  Service: Endoscopy;  Laterality: N/A;  . EXPLORATORY LAPAROTOMY    . FINE NEEDLE ASPIRATION  02/29/2020   Procedure: FINE NEEDLE ASPIRATION;  Surgeon: Arta Silence, MD;  Location: [facility];  Service: Endoscopy;;  . [place]   double bilateral  . MASS EXCISION  09/02/2012   Procedure: EXCISION MASS;  Surgeon: Earnstine Regal, MD;  Location: [facility];  Service: General;  Laterality: Left;  Excise mass Left posterior neck  . NM MYOCAR PERF WALL MOTION  05/06/2010   exercise cap 8 mets. ,mild perfusion defect in basal infer.,mid infer., apical infer., region consistent with infarct/scar. no significant ischemia  . PANCREATIC STENT PLACEMENT  02/29/2020   Procedure: PANCREATIC STENT PLACEMENT;  Surgeon: Arta Silence, MD;  [facility]: [facility];  Service: Endoscopy;;  . right chest wall exploration    .  SPHINCTEROTOMY  02/29/2020   Procedure: SPHINCTEROTOMY;  Surgeon: Arta Silence, MD;  Location: [facility];  Service: Endoscopy;;  . [name]  03/05/2020   Procedure: [name];  Surgeon: Clarene Essex, MD;  Location: [facility];  Service: Endoscopy;;  . UPPER ESOPHAGEAL ENDOSCOPIC ULTRASOUND (EUS) N/A 02/29/2020   Procedure: UPPER ESOPHAGEAL ENDOSCOPIC ULTRASOUND (EUS)  LINEAR SCOPE;  Surgeon: Arta Silence, MD;  [facility];  Service: Endoscopy;  Laterality: N/A;  DR. NEEDS TWO HOURS FOR PROCEDURE    Social History   Socioeconomic History  . Marital status: Married    Spouse name: Not on file  . Number of children: 2  . Years of education: Not on file  . Highest education level: Not on file  Occupational History  . 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 Smoking status: Former Smoker    Quit date: 01/28/2010    Years since quitting: 10.1  . Smokeless tobacco: Never Used  Substance and Sexual Activity  . Alcohol use: Yes    Alcohol/week: 1.0 - 2.0 standard drinks    Types: 1 - 2 Glasses of wine per week  . Drug use: No  . Sexual activity: Not on file  Other Topics Concern  . Not on file  Social History Narrative  . Not on file   Social Determinants of Health   Financial Resource Strain:   . Difficulty of Paying Living Expenses:   Food Insecurity:   . Worried About Charity fundraiser in the Last Year:   . Arboriculturist in the Last Year:   Transportation Needs:   . Film/video editor (Medical):   Marland Kitchen Lack of Transportation (Non-Medical):   Physical Activity:   . Days of Exercise per Week:   . Minutes of Exercise per Session:   Stress:   . Feeling of Stress :   Social Connections:   . Frequency of Communication with Friends and Family:   . Frequency of Social Gatherings with Friends and Family:   . Attends Religious Services:   . Active Member of Clubs or Organizations:   . Attends Archivist Meetings:   Marland Kitchen  Marital Status:      FAMILY HISTORY:  We obtained a detailed, 4-generation family history.  Significant diagnoses are listed below: Family History  Problem Relation Age of Onset  . Breast cancer Mother 91  . Dementia Mother   . Cystic fibrosis Father 30       late onset dx  . Healthy Brother   . Dementia Maternal Aunt   . Dementia Maternal Uncle   . Other Paternal Uncle        MVA while in the [employer]  . Arthritis Maternal Grandmother   . AAA (abdominal aortic aneurysm) Maternal Grandfather   . Other Paternal 91        old age  . Heart attack Paternal Grandfather   . Liver disease Paternal Uncle   . Pancreatitis Son        several bouts of  pancreatitis  . Breast cancer Cousin 42       mat first cousin  . Colon cancer Neg Hx     The patient has a son and daughter who are cancer free. Her son has had pancreatitis in the past.  She has one brother who is cancer free.  Both parents are deceased.  The patient's father was diagnosed with Cystic Fibrosis in his late 54's.  He had two brothers, one who died in a motor vehicle accident in the [place] in his February 12, 2023 and the other who died of liver failure thought to be due to alcohol use. The paternal grandparents are deceased due to non cancer related issues.  The patient's mother had breast cancer at age 66 and died from complications of dementia.  She had a brother and sister who are cancer free but both have dementia.  The sister has a daughter with breast cancer.  The maternal grandparents are deceased from non-cancer related issues.  Ms. Runquist is unaware of previous family history of genetic testing for hereditary cancer risks. Patient's maternal ancestors are of Caucasian descent, and paternal ancestors are of Caucasian descent. There is no reported Ashkenazi Jewish ancestry. There is no known consanguinity.  GENETIC COUNSELING ASSESSMENT: Ms. Kaufmann is a 64 y.o. female with a personal and family history of cancer which is  somewhat suggestive of a hereditary cancer syndrome and predisposition to cancer given the combination of breast and pancreatic cancer, as well as the family history of cystic fibrosis. We, therefore, discussed and recommended the following at today's visit.   DISCUSSION: We discussed that 5 - 10% of pancreatic cancer is hereditary, with most cases associated with BRCA mutations.  There are other genes that can be associated with hereditary pancreatic cancer syndromes.  These include ATM and PALB2.  We also discussed that individuals who are carriers of cystic fibrosis are not at risk for CF but can have health issues from being a carrier.  This can include female infertility due to congenital absence of the vas deferens (CBAVD) as well as chronic pancreatitis.  Chronic, as well as acute, pancreatitis can increase the risk for pancreatic cancer. The patient reports that her son has just recovered from a case of severe pancreatitis, and that he has had this in the past.  We discussed that testing is beneficial for several reasons including knowing how to follow individuals after completing their treatment, identifying whether potential treatment options such as PARP inhibitors would be beneficial, and understand if other family members could be at risk for cancer and allow them to undergo genetic testing.   We reviewed the characteristics, features and inheritance patterns of hereditary cancer syndromes. We also discussed genetic testing, including the appropriate family members to test, the process of testing, insurance coverage and turn-around-time for results. We discussed the implications of a negative, positive, carrier and/or variant of uncertain significant result. We recommended Ms. Angeletti pursue genetic testing for the common hereditary cancer gene panel, as well as pancreatitis panel and preliminary pancreatic cancer gene panel.   Based on Ms. Nistler's personal and family history of cancer, she meets  medical criteria for genetic testing. Despite that she meets criteria, she may still have an out of pocket cost. We discussed that if her out of pocket cost for testing is over $100, the laboratory will call and confirm whether she wants to proceed with testing.  If the out of pocket cost of testing is less than $100 she will be billed  by the genetic testing laboratory.   PLAN: After considering the risks, benefits, and limitations, Ms. Reposa provided informed consent to pursue genetic testing and the blood sample was sent to [facility] for analysis of the common hereditary cancer, pancreatitis and preliminary pancreatic cancer gene panel. Results should be available within approximately 2-3 weeks' time, at which point they will be disclosed by telephone to Ms. Achord, as will any additional recommendations warranted by these results. Ms. Zamarron will receive a summary of her genetic counseling visit and a copy of her results once available. This information will also be available in Epic.   Lastly, we encouraged Ms. Cast to remain in contact with cancer genetics annually so that we can continuously update the family history and inform her of any changes in cancer genetics and testing that may be of benefit for this family.   Ms. Varano questions were answered to her satisfaction today. Our contact information was provided should additional questions or concerns arise. Thank you for the referral and allowing [place] to share in the care of your patient.   Zayne Marovich P. Florene Glen, [place], [facility] Licensed, Insurance risk surveyor [email or web address] phone: [phone]  The patient was seen for a total of 39 minutes in face-to-face genetic counseling.  This patient was discussed with Drs. Magrinat, Lindi Adie and/or Burr Medico who agrees with the above.    _______________________________________________________________________ For Office Staff:  Number of people involved in session: 2 Was an Intern/ student  involved with case: no

## 2020-03-20 NOTE — Progress Notes (Signed)
Met with patient and her husband Merry Proud during their follow up appointment with Dr. Benay Spice after latest scans. Treatment plan was discussed with first obtaining placement of port a cath and chemo education class with plan to try to start treatment next Wednesday 4/28.  They were shown what a port looks like and feels like.  She is currently on Plavix and has already taken it today.  She was instructed that she would have to be off for 5 days prior to port placement.  The patient was tearful during the visit when she found out surgery was not an option at this point.  I will have SW to reach out to her as well.

## 2020-03-20 NOTE — Progress Notes (Signed)
START ON PATHWAY REGIMEN - Pancreatic Adenocarcinoma     A cycle is every 14 days:     Oxaliplatin      Leucovorin      Irinotecan      Fluorouracil   **Always confirm dose/schedule in your pharmacy ordering system**  Patient Characteristics: Metastatic Disease, First Line, PS = 0,1, BRCA1/2 and PALB2  Mutation Absent/Unknown Therapeutic Status: Metastatic Disease Line of Therapy: First Line ECOG Performance Status: 1 BRCA1/2 Mutation Status: Awaiting Test Results PALB2 Mutation Status: Awaiting Test Results Intent of Therapy: Non-Curative / Palliative Intent, Discussed with Patient 

## 2020-03-20 NOTE — Progress Notes (Signed)
Notified by nurse navigator, Malachy Mood RN that insurance denied the MS Contin. Called [facility] to request phone # to contact to obtain PA. [employer] [phone]. Forwarded information to [place], Therapist, sports.

## 2020-03-21 ENCOUNTER — Other Ambulatory Visit: Payer: Self-pay | Admitting: Oncology

## 2020-03-21 ENCOUNTER — Telehealth: Payer: Self-pay | Admitting: Oncology

## 2020-03-21 NOTE — Telephone Encounter (Signed)
Scheduled per los. Called and spoke with patient. Confirmed appts  

## 2020-03-22 ENCOUNTER — Telehealth: Payer: Self-pay | Admitting: *Deleted

## 2020-03-22 ENCOUNTER — Telehealth: Payer: Self-pay | Admitting: General Practice

## 2020-03-22 ENCOUNTER — Other Ambulatory Visit: Payer: 59

## 2020-03-22 NOTE — Telephone Encounter (Signed)
FYI  Appeal submitted today for prior authorization denial per [employer] for MS Contin 15 mg.

## 2020-03-22 NOTE — Telephone Encounter (Signed)
[place] CSW Progress Notes  Call to patient at request of Prince Rome, nurse navigator.  Reports she is doing fine, has great support system, strong faith, does not tend to worry.  New pain medication is working well.  Has second opinion scheduled at [facility] in early May.  No concerns at this time.  Edwyna Shell, LCSW Clinical Social Worker Phone:  [phone] Cell:  [phone]

## 2020-03-25 ENCOUNTER — Inpatient Hospital Stay: Payer: 59

## 2020-03-25 ENCOUNTER — Other Ambulatory Visit: Payer: Self-pay | Admitting: Student

## 2020-03-25 ENCOUNTER — Other Ambulatory Visit: Payer: Self-pay

## 2020-03-25 ENCOUNTER — Telehealth: Payer: Self-pay

## 2020-03-25 ENCOUNTER — Encounter: Payer: Self-pay | Admitting: Oncology

## 2020-03-25 ENCOUNTER — Other Ambulatory Visit: Payer: Self-pay | Admitting: *Deleted

## 2020-03-25 MED ORDER — PROCHLORPERAZINE MALEATE 10 MG PO TABS: 10.0000 mg | ORAL_TABLET | Freq: Four times a day (QID) | ORAL | 1 refills | Status: DC | PRN

## 2020-03-25 MED ORDER — LIDOCAINE-PRILOCAINE 2.5-2.5 % EX CREA: 1.0000 "application " | TOPICAL_CREAM | CUTANEOUS | 1 refills | Status: DC

## 2020-03-25 NOTE — Progress Notes (Signed)
Called pt to introduce myself as her Arboriculturist, discuss copay assistance and the [employer].  Pt informed me she has met her deductible for the year so her ins should pay for her treatment at 100% so copay assistance shouldn't be needed.  I informed her of the [employer], went over what it covers and gave her the income requirement.  Pt stated she's above the requirement so she doesn't qualify for the grant at this time.  I will give her my card on 03/28/20 for any questions or concerns she may have in the future.

## 2020-03-25 NOTE — Progress Notes (Signed)
Education nurse requested scripts for EMLA and compazine be sent to pharmacy. Noted that appeal for authorization for MS Contin 15 mg was submitted on 03/22/20 by Mackie Pai, RN

## 2020-03-25 NOTE — Telephone Encounter (Signed)
Authorized 02/24/2020 through 03/25/2021

## 2020-03-25 NOTE — Telephone Encounter (Signed)
After speaking to nuclear medicine regarding scheduled PET scan for 4/30 - patient will still have 5FU pump running it was advised that we reschedule her PET to another day.  I have rescheduled this to 5/5 at 9:00 to arrive at 8:30 NPO past midnight.  I have called the patient and updated her with this.  She verbalized an understanding.

## 2020-03-26 ENCOUNTER — Ambulatory Visit (HOSPITAL_COMMUNITY)
Admission: RE | Admit: 2020-03-26 | Discharge: 2020-03-26 | Disposition: A | Discharge status: Home / Self Care | Payer: 59 | Source: Ambulatory Visit | Attending: Oncology | Admitting: Oncology

## 2020-03-26 ENCOUNTER — Encounter (HOSPITAL_COMMUNITY): Payer: Self-pay

## 2020-03-26 ENCOUNTER — Other Ambulatory Visit: Payer: Self-pay | Admitting: Oncology

## 2020-03-26 DIAGNOSIS — Z8674 Personal history of sudden cardiac arrest: Secondary | ICD-10-CM | POA: Diagnosis not present

## 2020-03-26 DIAGNOSIS — Z803 Family history of malignant neoplasm of breast: Secondary | ICD-10-CM | POA: Diagnosis not present

## 2020-03-26 DIAGNOSIS — Z955 Presence of coronary angioplasty implant and graft: Secondary | ICD-10-CM | POA: Insufficient documentation

## 2020-03-26 DIAGNOSIS — Z7902 Long term (current) use of antithrombotics/antiplatelets: Secondary | ICD-10-CM | POA: Insufficient documentation

## 2020-03-26 DIAGNOSIS — Z800 Family history of malignant neoplasm of digestive organs: Secondary | ICD-10-CM | POA: Insufficient documentation

## 2020-03-26 DIAGNOSIS — I10 Essential (primary) hypertension: Secondary | ICD-10-CM | POA: Diagnosis not present

## 2020-03-26 DIAGNOSIS — Z79899 Other long term (current) drug therapy: Secondary | ICD-10-CM | POA: Insufficient documentation

## 2020-03-26 DIAGNOSIS — Z8249 Family history of ischemic heart disease and other diseases of the circulatory system: Secondary | ICD-10-CM | POA: Diagnosis not present

## 2020-03-26 DIAGNOSIS — Z7982 Long term (current) use of aspirin: Secondary | ICD-10-CM | POA: Diagnosis not present

## 2020-03-26 DIAGNOSIS — I2510 Atherosclerotic heart disease of native coronary artery without angina pectoris: Secondary | ICD-10-CM | POA: Insufficient documentation

## 2020-03-26 DIAGNOSIS — E785 Hyperlipidemia, unspecified: Secondary | ICD-10-CM | POA: Diagnosis not present

## 2020-03-26 DIAGNOSIS — I252 Old myocardial infarction: Secondary | ICD-10-CM | POA: Insufficient documentation

## 2020-03-26 DIAGNOSIS — C259 Malignant neoplasm of pancreas, unspecified: Secondary | ICD-10-CM | POA: Diagnosis not present

## 2020-03-26 DIAGNOSIS — Z87891 Personal history of nicotine dependence: Secondary | ICD-10-CM | POA: Diagnosis not present

## 2020-03-26 HISTORY — PX: IR IMAGING GUIDED PORT INSERTION: IMG5740

## 2020-03-26 MED ORDER — LIDOCAINE HCL 1 % IJ SOLN
INTRAMUSCULAR | Status: AC
  Filled 2020-03-26: qty 20

## 2020-03-26 MED ORDER — FENTANYL CITRATE (PF) 100 MCG/2ML IJ SOLN
INTRAMUSCULAR | Status: AC | PRN
  Administered 2020-03-26: 50 ug via INTRAVENOUS
  Administered 2020-03-26: 25 ug via INTRAVENOUS

## 2020-03-26 MED ORDER — LIDOCAINE HCL 1 % IJ SOLN
INTRAMUSCULAR | Status: AC | PRN
  Administered 2020-03-26: 10 mL

## 2020-03-26 MED ORDER — MIDAZOLAM HCL 2 MG/2ML IJ SOLN
INTRAMUSCULAR | Status: AC
  Filled 2020-03-26: qty 2

## 2020-03-26 MED ORDER — SODIUM CHLORIDE 0.9 % IV SOLN: INTRAVENOUS | Status: DC

## 2020-03-26 MED ORDER — MIDAZOLAM HCL 2 MG/2ML IJ SOLN
INTRAMUSCULAR | Status: AC | PRN
  Administered 2020-03-26: 1 mg via INTRAVENOUS
  Administered 2020-03-26: 0.5 mg via INTRAVENOUS

## 2020-03-26 MED ORDER — FENTANYL CITRATE (PF) 100 MCG/2ML IJ SOLN
INTRAMUSCULAR | Status: AC
  Filled 2020-03-26: qty 2

## 2020-03-26 MED ORDER — HEPARIN SOD (PORK) LOCK FLUSH 100 UNIT/ML IV SOLN
INTRAVENOUS | Status: AC | PRN
  Administered 2020-03-26: 500 [IU] via INTRAVENOUS

## 2020-03-26 MED ORDER — CEFAZOLIN SODIUM-DEXTROSE 2-4 GM/100ML-% IV SOLN
INTRAVENOUS | Status: AC
  Filled 2020-03-26: qty 100

## 2020-03-26 MED ORDER — HEPARIN SOD (PORK) LOCK FLUSH 100 UNIT/ML IV SOLN
INTRAVENOUS | Status: AC
  Filled 2020-03-26: qty 5

## 2020-03-26 MED ORDER — CEFAZOLIN SODIUM-DEXTROSE 2-4 GM/100ML-% IV SOLN
2.0000 g | INTRAVENOUS | Status: AC
  Administered 2020-03-26: 2 g via INTRAVENOUS

## 2020-03-26 NOTE — H&P (Signed)
Chief Complaint: Patient was seen in consultation today for [facility] a cath placement at the request of Sherrill,Gary B  Referring Physician(s): Ladell Pier  Supervising Physician: Aletta Edouard  Patient Status: [facility] [place] - Out-pt  History of Present Illness: Vicki [name] is a 64 y.o. female   Newly Dx Pancreatic Cancer To start chemo therapy 03/28/20 Follows with Dr Benay Spice  02/29/20: Bx MRCP: FINAL MICROSCOPIC DIAGNOSIS:  - Malignant cells consistent with adenocarcinoma    4/20 CT: IMPRESSION: 1. 2.9 x 2.1 x 3.1 cm hypovascular mass in the head and uncinate process of the pancreas, similar to prior abdominal MRI 02/21/2020. This is again associated with pancreatic ductal dilatation, and some retroperitoneal lymphadenopathy noted on today's examination, concerning for nodal spread of disease. The lesion is intimately associated with both the infrarenal abdominal aorta and the superior mesenteric artery, as detailed above. 2. Interval placement of common bile duct stent which appears appropriately located.  Scheduled now for [facility] a Cath placement     Past Medical History:  Diagnosis Date  . CAD (coronary artery disease) 2011   with stent to RCA and 60 % LAD disease  . Clotting disorder ([place])   . Family history of breast cancer   . Family history of cystic fibrosis   . H/O cardiac arrest 02/15/10   with STEMI- Inf wall  . Hyperlipidemia   . Hypertension   . Myocardial infarct ([place]) 02/15/10  . pancreatic ca dx'd 02/2020  . Shock, cardiogenic ([place]) 01/2010   with MI, IABP    Past Surgical History:  Procedure Laterality Date  . ABDOMINAL HYSTERECTOMY  1987   BSO  . APPENDECTOMY  1987  . BILIARY STENT PLACEMENT N/A 03/05/2020   Procedure: BILIARY STENT PLACEMENT;  Surgeon: Clarene Essex, MD;  Location: [facility] ENDOSCOPY;  Service: Endoscopy;  Laterality: N/A;  . CORONARY ANGIOPLASTY WITH STENT PLACEMENT  02/15/10   Stent to prox RCA -BMS  . DOPPLER ECHOCARDIOGRAPHY   05/06/2010   EF =50-55%  lvfx low normal ;no sigificant valvular disease seen  . ENDOSCOPIC RETROGRADE CHOLANGIOPANCREATOGRAPHY (ERCP) WITH PROPOFOL N/A 02/29/2020   Procedure: ENDOSCOPIC RETROGRADE CHOLANGIOPANCREATOGRAPHY (ERCP) WITH PROPOFOL;  Surgeon: Arta Silence, MD;  Location: [facility];  Service: Endoscopy;  Laterality: N/A;  . ERCP N/A 03/05/2020   Procedure: ENDOSCOPIC RETROGRADE CHOLANGIOPANCREATOGRAPHY (ERCP);  Surgeon: Clarene Essex, MD;  Location: [facility];  Service: Endoscopy;  Laterality: N/A;  with stent placement  . ESOPHAGOGASTRODUODENOSCOPY (EGD) WITH PROPOFOL N/A 02/29/2020   Procedure: ESOPHAGOGASTRODUODENOSCOPY (EGD) WITH PROPOFOL;  Surgeon: Arta Silence, MD;  Location: [facility];  Service: Endoscopy;  Laterality: N/A;  . EXPLORATORY LAPAROTOMY    . FINE NEEDLE ASPIRATION  02/29/2020   Procedure: FINE NEEDLE ASPIRATION;  Surgeon: Arta Silence, MD;  Location: [facility];  Service: Endoscopy;;  . Gay   double bilateral  . MASS EXCISION  09/02/2012   Procedure: EXCISION MASS;  Surgeon: Earnstine Regal, MD;  Location: [place];  Service: General;  Laterality: Left;  Excise mass Left posterior neck  . NM MYOCAR PERF WALL MOTION  05/06/2010   exercise cap 8 mets. ,mild perfusion defect in basal infer.,mid infer., apical infer., region consistent with infarct/scar. no significant ischemia  . PANCREATIC STENT PLACEMENT  02/29/2020   Procedure: PANCREATIC STENT PLACEMENT;  Surgeon: Arta Silence, MD;  Location: [facility];  Service: Endoscopy;;  . right chest wall exploration    . SPHINCTEROTOMY  02/29/2020   Procedure: SPHINCTEROTOMY;  Surgeon: Arta Silence, MD;  [facility]: [facility];  Service: Endoscopy;;  . SPHINCTEROTOMY  03/05/2020   Procedure: SPHINCTEROTOMY;  Surgeon: Clarene Essex, MD;  [facility]: [facility];  Service: Endoscopy;;  . UPPER ESOPHAGEAL ENDOSCOPIC ULTRASOUND (EUS) N/A 02/29/2020   Procedure: UPPER ESOPHAGEAL  ENDOSCOPIC ULTRASOUND (EUS)  LINEAR SCOPE;  Surgeon: Arta Silence, MD;  [facility]: [facility];  Service: Endoscopy;  Laterality: N/A;  DR. NEEDS TWO HOURS FOR PROCEDURE    Allergies: Patient has no known allergies.  Medications: Prior to Admission medications   Medication Sig Start Date End Date Taking? Authorizing Provider  ALPRAZolam (XANAX) 0.25 MG tablet Take 1 tablet (0.25 mg total) by mouth at bedtime as needed. Patient taking differently: Take 0.25 mg by mouth at bedtime.  11/02/19  Yes Midge Minium, MD  aspirin 81 MG tablet Take 81 mg by mouth at bedtime.    Yes [provider]  budesonide (ENTOCORT EC) 3 MG 24 hr capsule Take 3 mg by mouth daily.  01/26/18  Yes [provider]  cetirizine (ZYRTEC) 10 MG tablet Take 10 mg by mouth daily.   Yes [provider]  Cholecalciferol (VITAMIN D3) 5000 units CAPS Take 5,000 Units by mouth at bedtime.    Yes [provider]  clopidogrel (PLAVIX) 75 MG tablet Take 75 mg by mouth daily.   Yes [provider]  escitalopram (LEXAPRO) 10 MG tablet Take 1 tablet (10 mg total) by mouth daily. Patient taking differently: Take 5 mg by mouth in the morning and at bedtime.  01/04/20  Yes Midge Minium, MD  ibuprofen (ADVIL) 200 MG tablet Take 400 mg by mouth every 5 (five) hours as needed for moderate pain (take w/Percocet).    Yes [provider]  lisinopril (ZESTRIL) 5 MG tablet Take 1 tablet (5 mg total) by mouth daily. 11/29/19  Yes Kroeger, Daleen Snook M., PA-C  metoprolol tartrate (LOPRESSOR) 50 MG tablet TAKE 1 TABLET(50 MG) BY MOUTH TWICE DAILY Patient taking differently: Take 25 mg by mouth 2 (two) times daily.  02/20/19  Yes Lorretta Harp, MD  montelukast (SINGULAIR) 10 MG tablet TAKE 1 TABLET(10 MG) BY MOUTH AT BEDTIME Patient taking differently: Take 10 mg by mouth at bedtime.  11/02/19  Yes Midge Minium, MD  morphine (MS CONTIN) 15 MG 12 hr tablet Take 1 tablet (15 mg  total) by mouth every 12 (twelve) hours. 03/20/20  Yes Ladell Pier, MD  nitroGLYCERIN (NITROSTAT) 0.4 MG SL tablet PLACE 1 TABLET UNDER THE TONGUE EVERY 5 MINUTES AS NEEDED FOR CHEST PAIN 11/29/19  Yes Lorretta Harp, MD  Omega-3 1000 MG CAPS Take 1,000 mg by mouth at bedtime.   Yes [provider]  ondansetron (ZOFRAN) 8 MG tablet Take 1 tablet (8 mg total) by mouth every 8 (eight) hours as needed for nausea or vomiting. 03/12/20  Yes Ladell Pier, MD  oxyCODONE-acetaminophen (PERCOCET) 10-325 MG tablet Take 1 tablet by mouth every 4 (four) hours as needed for pain. Patient taking differently: Take 1 tablet by mouth every 5 (five) hours as needed for pain.  03/12/20  Yes Ladell Pier, MD  pantoprazole (PROTONIX) 40 MG tablet Take 1 tablet (40 mg total) by mouth 2 (two) times daily. 11/30/19  Yes Lorretta Harp, MD  polyethylene glycol (MIRALAX / GLYCOLAX) 17 g packet Take 17 g by mouth daily.    Yes [provider]  potassium chloride SA (KLOR-CON) 20 MEQ tablet TAKE 1 TABLET(20 MEQ) BY MOUTH DAILY Patient taking differently: Take  20 mEq by mouth daily.  03/15/20  Yes Lorretta Harp, MD  rosuvastatin (CRESTOR) 10 MG tablet TAKE 1 TABLET(10 MG) BY MOUTH DAILY Patient taking differently: Take 10 mg by mouth daily.  01/03/20  Yes Lorretta Harp, MD  lidocaine-prilocaine (EMLA) cream Apply 1 application topically as directed. Apply to port site 1 hour prior to stick and cover with plastic wrap 03/25/20   Ladell Pier, MD  prochlorperazine (COMPAZINE) 10 MG tablet Take 1 tablet (10 mg total) by mouth every 6 (six) hours as needed for nausea. 03/25/20   Ladell Pier, MD     Family History  Problem Relation Age of Onset  . Breast cancer Mother 19  . Dementia Mother   . Cystic fibrosis Father 31       late onset dx  . Healthy Brother   . Dementia Maternal Aunt   . Dementia Maternal Uncle   . Other Paternal Uncle        MVA while in the [employer]  .  Arthritis Maternal Grandmother   . AAA (abdominal aortic aneurysm) Maternal Grandfather   . Other Paternal 60        old age  . Heart attack Paternal Grandfather   . Liver disease Paternal Uncle   . Pancreatitis Son        several bouts of pancreatitis  . Breast cancer Cousin 4       mat first cousin  . Colon cancer Neg Hx     Social History   Socioeconomic History  . Marital status: Married    Spouse name: Not on file  . Number of children: 2  . Years of education: Not on file  . Highest education level: Not on file  Occupational History  . 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 Smoking status: Former Smoker    Quit date: 01/28/2010    Years since quitting: 10.1  . Smokeless tobacco: Never Used  Substance and Sexual Activity  . Alcohol use: Yes    Alcohol/week: 1.0 - 2.0 standard drinks    Types: 1 - 2 Glasses of wine per week  . Drug use: No  . Sexual activity: Not on file  Other Topics Concern  . Not on file  Social History Narrative  . Not on file   Social Determinants of Health   Financial Resource Strain:   . Difficulty of Paying Living Expenses:   Food Insecurity:   . Worried About Charity fundraiser in the Last Year:   . Arboriculturist in the Last Year:   Transportation Needs:   . Film/video editor (Medical):   Marland Kitchen Lack of Transportation (Non-Medical):   Physical Activity:   . Days of Exercise per Week:   . Minutes of Exercise per Session:   Stress:   . Feeling of Stress :   Social Connections:   . Frequency of Communication with Friends and Family:   . Frequency of Social Gatherings with Friends and Family:   . Attends Religious Services:   . Active Member of Clubs or Organizations:   . Attends Archivist Meetings:   Marland Kitchen Marital Status:     Review of Systems: A 12 point ROS discussed and pertinent positives are indicated in the HPI above.  All other systems are negative.  Review of Systems    Constitutional: Positive for activity change, appetite change and unexpected weight change. Negative for fever.  Respiratory: Negative  for cough and shortness of breath.   Cardiovascular: Negative for chest pain.  Gastrointestinal: Positive for nausea.  Musculoskeletal: Positive for back pain.  Psychiatric/Behavioral: Negative for behavioral problems and confusion.    Vital Signs: BP 122/70   Pulse 75   Temp 98.5 F (36.9 C) (Skin)   Resp 16   Ht 5\' 6"  (1.676 m)   Wt 147 lb (66.7 kg)   SpO2 94%   BMI 23.73 kg/m   Physical Exam Vitals reviewed.  Cardiovascular:     Rate and Rhythm: Normal rate and regular rhythm.     Heart sounds: Normal heart sounds.  Pulmonary:     Effort: Pulmonary effort is normal.     Breath sounds: Normal breath sounds.  Abdominal:     Palpations: Abdomen is soft.  Skin:    General: Skin is warm and dry.  Neurological:     Mental Status: She is alert and oriented to person, place, and time.  Psychiatric:        Mood and Affect: Mood normal.        Behavior: Behavior normal.        Thought Content: Thought content normal.        Judgment: Judgment normal.     Imaging: CT CHEST W CONTRAST  Result Date: 03/19/2020 CLINICAL DATA:  64 year old female with history of pancreatic cancer. 20 pound weight loss since January 2021. Mid abdominal pain radiating to the back since March 2020. EXAM: CT CHEST, ABDOMEN, AND PELVIS WITH CONTRAST TECHNIQUE: Multidetector CT imaging of the chest, abdomen and pelvis was performed following the standard protocol during bolus administration of intravenous contrast. CONTRAST:  140mL OMNIPAQUE IOHEXOL 300 MG/ML  SOLN COMPARISON:  Abdominal MRI 02/21/2020. FINDINGS: CT CHEST FINDINGS Cardiovascular: Heart size is borderline enlarged. There is no significant pericardial fluid, thickening or pericardial calcification. There is aortic atherosclerosis, as well as atherosclerosis of the great vessels of the mediastinum and the  coronary arteries, including calcified atherosclerotic plaque in the left main, left anterior descending, left circumflex and right coronary arteries. Mediastinum/Nodes: No pathologically enlarged mediastinal or hilar lymph nodes. Esophagus is unremarkable in appearance. No axillary lymphadenopathy. Lungs/Pleura: 4 mm pulmonary nodule in the superior segment of the left lower lobe (axial image 46 of series 12), nonspecific. No other larger more suspicious appearing pulmonary nodules or masses are noted. No acute consolidative airspace disease. No pleural effusions. Linear areas of scarring or subsegmental atelectasis in the right middle lobe and inferior segment of the lingula incidentally noted. Musculoskeletal: Chronic appearing compression fracture with 25% loss of anterior vertebral body height. Old healed fracture of the mid sternum. There are no aggressive appearing lytic or blastic lesions noted in the visualized portions of the skeleton. CT ABDOMEN PELVIS FINDINGS Hepatobiliary: No discrete cystic or solid hepatic lesions are confidently identified on today's examination. Gallbladder is unremarkable in appearance. Small amount of pneumobilia related to prior sphincterotomy. Common bile duct stent noted in the common bile duct. Pancreas: Previously noted hypovascular mass in the head and uncinate process of the pancreas is again noted (best appreciated on axial image 101 of series 7 and coronal image 49 of series 8) where this measures approximately 2.9 x 2.1 x 3.1 cm. This lesion comes in close proximity to the superior mesenteric artery with apparent contact with the proximal superior mesenteric artery from approximately 10:00 to 6:00 (axial image 107 of series 7). The anterior aspect of the lesion also comes in close proximity to the splenoportal confluence, without definite  contact or distortion of the splenoportal confluence or the proximal portal vein. The posterior and inferior aspect of the lesion  comes in direct contact with the anterior surface of the abdominal aorta immediately below the level of the renal arteries. Dilatation of the main pancreatic duct which measures up to 8 mm in the body and head of the pancreas. Mild atrophy throughout the distal body and tail of the pancreas. Spleen: Unremarkable. Adrenals/Urinary Tract: Bilateral kidneys and adrenal glands are normal in appearance. No hydroureteronephrosis. Urinary bladder is normal in appearance. Stomach/Bowel: Normal appearance of the stomach. No pathologic dilatation of small bowel or colon. A few scattered colonic diverticulae are noted, most evident in the sigmoid colon, without surrounding inflammatory changes to suggest an acute diverticulitis at this time. The appendix is not confidently identified and may be surgically absent. Regardless, there are no inflammatory changes noted adjacent to the cecum to suggest the presence of an acute appendicitis at this time. Vascular/Lymphatic: Aortic atherosclerosis, without evidence of aneurysm or dissection in the abdominal or pelvic vasculature. Vascular findings pertinent to pancreatic mass, as detailed above. Numerous prominent borderline enlarged and mildly enlarged retroperitoneal lymph nodes are noted measuring up to 1.1 cm in short axis in the left para-aortic nodal station adjacent to the left renal hilum (axial image 108 of series 7). No other definite pathologically enlarged abdominal or pelvic lymph nodes are identified. Reproductive: Status post hysterectomy. Ovaries are not confidently identified may be surgically absent or atrophic. Other: No significant volume of ascites.  No pneumoperitoneum. Musculoskeletal: There are no aggressive appearing lytic or blastic lesions noted in the visualized portions of the skeleton. IMPRESSION: 1. 2.9 x 2.1 x 3.1 cm hypovascular mass in the head and uncinate process of the pancreas, similar to prior abdominal MRI 02/21/2020. This is again associated  with pancreatic ductal dilatation, and some retroperitoneal lymphadenopathy noted on today's examination, concerning for nodal spread of disease. The lesion is intimately associated with both the infrarenal abdominal aorta and the superior mesenteric artery, as detailed above. 2. Interval placement of common bile duct stent which appears appropriately located. 3. No definite evidence of metastatic disease in the lungs. There is a nonspecific 4 mm pulmonary nodule in the superior segment of the left lower lobe. Attention on follow-up studies is recommended to ensure stability. 4. Colonic diverticulosis without evidence of acute diverticulitis at this time. 5. Aortic atherosclerosis, in addition to left main and 3 vessel coronary artery disease. 6. Additional incidental findings, as above. Electronically Signed   By: Vinnie Langton M.D.   On: 03/19/2020 11:36   CT ABDOMEN PELVIS W CONTRAST  Result Date: 03/19/2020 CLINICAL DATA:  64 year old female with history of pancreatic cancer. 20 pound weight loss since January 2021. Mid abdominal pain radiating to the back since March 2020. EXAM: CT CHEST, ABDOMEN, AND PELVIS WITH CONTRAST TECHNIQUE: Multidetector CT imaging of the chest, abdomen and pelvis was performed following the standard protocol during bolus administration of intravenous contrast. CONTRAST:  18mL OMNIPAQUE IOHEXOL 300 MG/ML  SOLN COMPARISON:  Abdominal MRI 02/21/2020. FINDINGS: CT CHEST FINDINGS Cardiovascular: Heart size is borderline enlarged. There is no significant pericardial fluid, thickening or pericardial calcification. There is aortic atherosclerosis, as well as atherosclerosis of the great vessels of the mediastinum and the coronary arteries, including calcified atherosclerotic plaque in the left main, left anterior descending, left circumflex and right coronary arteries. Mediastinum/Nodes: No pathologically enlarged mediastinal or hilar lymph nodes. Esophagus is unremarkable in  appearance. No axillary lymphadenopathy. Lungs/Pleura: 4 mm  pulmonary nodule in the superior segment of the left lower lobe (axial image 46 of series 12), nonspecific. No other larger more suspicious appearing pulmonary nodules or masses are noted. No acute consolidative airspace disease. No pleural effusions. Linear areas of scarring or subsegmental atelectasis in the right middle lobe and inferior segment of the lingula incidentally noted. Musculoskeletal: Chronic appearing compression fracture with 25% loss of anterior vertebral body height. Old healed fracture of the mid sternum. There are no aggressive appearing lytic or blastic lesions noted in the visualized portions of the skeleton. CT ABDOMEN PELVIS FINDINGS Hepatobiliary: No discrete cystic or solid hepatic lesions are confidently identified on today's examination. Gallbladder is unremarkable in appearance. Small amount of pneumobilia related to prior sphincterotomy. Common bile duct stent noted in the common bile duct. Pancreas: Previously noted hypovascular mass in the head and uncinate process of the pancreas is again noted (best appreciated on axial image 101 of series 7 and coronal image 49 of series 8) where this measures approximately 2.9 x 2.1 x 3.1 cm. This lesion comes in close proximity to the superior mesenteric artery with apparent contact with the proximal superior mesenteric artery from approximately 10:00 to 6:00 (axial image 107 of series 7). The anterior aspect of the lesion also comes in close proximity to the splenoportal confluence, without definite contact or distortion of the splenoportal confluence or the proximal portal vein. The posterior and inferior aspect of the lesion comes in direct contact with the anterior surface of the abdominal aorta immediately below the level of the renal arteries. Dilatation of the main pancreatic duct which measures up to 8 mm in the body and head of the pancreas. Mild atrophy throughout the distal  body and tail of the pancreas. Spleen: Unremarkable. Adrenals/Urinary Tract: Bilateral kidneys and adrenal glands are normal in appearance. No hydroureteronephrosis. Urinary bladder is normal in appearance. Stomach/Bowel: Normal appearance of the stomach. No pathologic dilatation of small bowel or colon. A few scattered colonic diverticulae are noted, most evident in the sigmoid colon, without surrounding inflammatory changes to suggest an acute diverticulitis at this time. The appendix is not confidently identified and may be surgically absent. Regardless, there are no inflammatory changes noted adjacent to the cecum to suggest the presence of an acute appendicitis at this time. Vascular/Lymphatic: Aortic atherosclerosis, without evidence of aneurysm or dissection in the abdominal or pelvic vasculature. Vascular findings pertinent to pancreatic mass, as detailed above. Numerous prominent borderline enlarged and mildly enlarged retroperitoneal lymph nodes are noted measuring up to 1.1 cm in short axis in the left para-aortic nodal station adjacent to the left renal hilum (axial image 108 of series 7). No other definite pathologically enlarged abdominal or pelvic lymph nodes are identified. Reproductive: Status post hysterectomy. Ovaries are not confidently identified may be surgically absent or atrophic. Other: No significant volume of ascites.  No pneumoperitoneum. Musculoskeletal: There are no aggressive appearing lytic or blastic lesions noted in the visualized portions of the skeleton. IMPRESSION: 1. 2.9 x 2.1 x 3.1 cm hypovascular mass in the head and uncinate process of the pancreas, similar to prior abdominal MRI 02/21/2020. This is again associated with pancreatic ductal dilatation, and some retroperitoneal lymphadenopathy noted on today's examination, concerning for nodal spread of disease. The lesion is intimately associated with both the infrarenal abdominal aorta and the superior mesenteric artery, as  detailed above. 2. Interval placement of common bile duct stent which appears appropriately located. 3. No definite evidence of metastatic disease in the lungs. There is a  nonspecific 4 mm pulmonary nodule in the superior segment of the left lower lobe. Attention on follow-up studies is recommended to ensure stability. 4. Colonic diverticulosis without evidence of acute diverticulitis at this time. 5. Aortic atherosclerosis, in addition to left main and 3 vessel coronary artery disease. 6. Additional incidental findings, as above. Electronically Signed   By: Vinnie Langton M.D.   On: 03/19/2020 11:36   DG ERCP  Result Date: 03/07/2020 CLINICAL DATA:  ERCP. EXAM: ERCP TECHNIQUE: Multiple spot images obtained with the fluoroscopic device and submitted for interpretation post-procedure. FLUOROSCOPY TIME:  Fluoroscopy Time:  166.9 seconds Number of Acquired Spot Images: 16 COMPARISON:  February 29, 2020 FINDINGS: The patient has undergone ERCP with stent placement. Initial images demonstrate cannulation of the common bile duct with injection of contrast. There is a high-grade stenosis or filling defect that appears to be located within the mid CBD. There has been interval placement of a stent. The stent remains narrow at the level of the filling defect. There is intrahepatic biliary ductal dilatation with retention of injected contrast. IMPRESSION: Status post ERCP as above. These images were submitted for radiologic interpretation only. Please see the procedural report for the amount of contrast and the fluoroscopy time utilized. Electronically Signed   By: Constance Holster M.D.   On: 03/07/2020 23:02   DG ERCP BILIARY & PANCREATIC DUCTS  Result Date: 02/29/2020 CLINICAL DATA:  Pancreatic mass EXAM: ERCP TECHNIQUE: Single image submitted for interpretation post-procedure. COMPARISON:  MRCP 02/21/2020 FINDINGS: Single spot image documents endoscope in the stomach IMPRESSION: Endoscopy These images were submitted  for radiologic interpretation only. Please see the procedural report for the amount of contrast and the fluoroscopy time utilized. Electronically Signed   By: Lucrezia Europe M.D.   On: 02/29/2020 15:57    Labs:  CBC: Recent Labs    03/12/20 1525  WBC 13.0*  HGB 11.5*  HCT 35.6*  PLT 451*    COAGS: No results for input(s): INR, APTT in the last 8760 hours.  BMP: Recent Labs    03/12/20 1525  NA 140  K 3.9  CL 104  CO2 25  GLUCOSE 93  BUN 9  CALCIUM 9.5  CREATININE 0.82  GFRNONAA >60  GFRAA >60    LIVER FUNCTION TESTS: Recent Labs    03/12/20 1525  BILITOT 2.0*  AST 40  ALT 111*  ALKPHOS 329*  PROT 7.6  ALBUMIN 3.7    TUMOR MARKERS: No results for input(s): AFPTM, CEA, CA199, CHROMGRNA in the last 8760 hours.  Assessment and Plan:  Pancreatic cancer To start chemo therapy Thurs 03/28/20 Scheduled today for PAC placement Risks and benefits of image guided port-a-catheter placement was discussed with the patient including, but not limited to bleeding, infection, pneumothorax, or fibrin sheath development and need for additional procedures.  All of the patient's questions were answered, patient is agreeable to proceed. Consent signed and in chart.   Thank you for this interesting consult.  I greatly enjoyed meeting BOBIE MILTENBERGER and look forward to participating in their care.  A copy of this report was sent to the requesting provider on this date.  Electronically Signed: Lavonia Drafts, PA-C 03/26/2020, 11:16 AM   I spent a total of  30 Minutes   in face to face in clinical consultation, greater than 50% of which was counseling/coordinating care for [facility] a cath placement

## 2020-03-26 NOTE — Procedures (Signed)
Interventional Radiology Procedure Note  Procedure: Single Lumen Power Port Placement    Access:  Right IJ vein.  Findings: Catheter tip positioned at SVC/RA junction. Port is ready for immediate use.   Complications: None  EBL: < 10 mL  Recommendations:  - Ok to shower in 24 hours - Do not submerge for 7 days - Routine line care   Everlee Quakenbush T. Missi Mcmackin, M.D Pager:  [phone]   

## 2020-03-26 NOTE — Discharge Instructions (Signed)
Implanted Port Insertion, Care After This sheet gives you information about how to care for yourself after your procedure. Your health care provider may also give you more specific instructions. If you have problems or questions, contact your health care provider. What can I expect after the procedure? After the procedure, it is common to have:  Discomfort at the port insertion site.  Bruising on the skin over the port. This should improve over 3-4 days. Follow these instructions at home: [facility] care  After your port is placed, you will get a manufacturer's information card. The card has information about your port. Keep this card with you at all times.  Take care of the port as told by your health care provider. Ask your health care provider if you or a family member can get training for taking care of the port at home. A home health care nurse may also take care of the port.  Make sure to remember what type of port you have. Incision care      Follow instructions from your health care provider about how to take care of your port insertion site. Make sure you: ? Wash your hands with soap and water before and after you change your bandage (dressing). If soap and water are not available, use hand sanitizer. ? Remove your dressing as told by your health care provider. In 24-48 hours ? Leave stitches (sutures), skin glue, or adhesive strips in place. These skin closures may need to stay in place for 2 weeks or longer. If adhesive strip edges start to loosen and curl up, you may trim the loose edges. Do not remove adhesive strips completely unless your health care provider tells you to do that.  Check your port insertion site every day for signs of infection. Check for: ? Redness, swelling, or pain. ? Fluid or blood. ? Warmth. ? Pus or a bad smell. Activity  Return to your normal activities as told by your health care provider. Ask your health care provider what activities are safe for  you.  Do not lift anything that is heavier than 10 lb (4.5 kg), or the limit that you are told, until your health care provider says that it is safe. General instructions  Take over-the-counter and prescription medicines only as told by your health care provider.  Do not take baths, swim, or use a hot tub until your health care provider approves. Ask your health care provider if you may take showers. You may only be allowed to take sponge baths.  Do not drive for 24 hours if you were given a sedative during your procedure.  Wear a medical alert bracelet in case of an emergency. This will tell any health care providers that you have a port.  Keep all follow-up visits as told by your health care provider. This is important. Contact a health care provider if:  You cannot flush your port with saline as directed, or you cannot draw blood from the port.  You have a fever or chills.  You have redness, swelling, or pain around your port insertion site.  You have fluid or blood coming from your port insertion site.  Your port insertion site feels warm to the touch.  You have pus or a bad smell coming from the port insertion site. Get help right away if:  You have chest pain or shortness of breath.  You have bleeding from your port that you cannot control. Summary  Take care of the port as told  by your health care provider. Keep the manufacturer's information card with you at all times.  Change your dressing as told by your health care provider.  Contact a health care provider if you have a fever or chills or if you have redness, swelling, or pain around your port insertion site.  Keep all follow-up visits as told by your health care provider. This information is not intended to replace advice given to you by your health care provider. Make sure you discuss any questions you have with your health care provider. Document Revised: 06/14/2018 Document Reviewed: 06/14/2018 Elsevier  Patient Education  [place].

## 2020-03-27 ENCOUNTER — Other Ambulatory Visit: Payer: 59

## 2020-03-27 ENCOUNTER — Ambulatory Visit: Payer: 59 | Admitting: Nurse Practitioner

## 2020-03-27 NOTE — Progress Notes (Addendum)
[place]   [phone] [phone]) [phone]   Clinic Follow up Note   Patient Care Team: Midge Minium, MD as PCP - General (Family Medicine) Lorretta Harp, MD as PCP - Cardiology (Cardiology) Lorretta Harp, MD as Consulting Physician (Cardiology) Everlene Farrier, MD as Consulting Physician (Obstetrics and Gynecology) Wonda Horner, MD as Consulting Physician (Gastroenterology) Harriett Sine, MD as Consulting Physician (Dermatology) Jonnie Finner, RN as Oncology Nurse Navigator 03/28/2020  CHIEF COMPLAINT: F/u pancreas cancer   CURRENT THERAPY: Systemic chemotherapy, FOLFIRINOX q2 weeks starting 03/28/2020  INTERVAL HISTORY: Vicki [name] returns for f/u and treatment as scheduled. She has port placed on 03/26/20. She has itching related to barium, Morphine, and tape. Was not sure if she could take benadryl. She has constipation, able to have small hard stools with straining, but no good BM in a week. She is taking miralax and dulcolax. No n/v. Has burping, on PPI. She is eating and drinking well. Able to be up and active at home. Her back pain is managed with morphine BID and motrin for breakthrough, denies pain currently. Denies recent fever, chills, cough, chest pain, dyspnea, leg edema.    MEDICAL HISTORY:  Past Medical History:  Diagnosis Date  . CAD (coronary artery disease) 2011   with stent to RCA and 60 % LAD disease  . Clotting disorder ([place])   . Family history of breast cancer   . Family history of cystic fibrosis   . H/O cardiac arrest 02/15/10   with STEMI- Inf wall  . Hyperlipidemia   . Hypertension   . Myocardial infarct ([place]) 02/15/10  . pancreatic ca dx'd 02/2020  . Shock, cardiogenic ([place]) 01/2010   with MI, IABP    SURGICAL HISTORY: Past Surgical History:  Procedure Laterality Date  . ABDOMINAL HYSTERECTOMY  1987   BSO  . APPENDECTOMY  1987  . BILIARY STENT PLACEMENT N/A 03/05/2020   Procedure: BILIARY STENT  PLACEMENT;  Surgeon: Clarene Essex, MD;  Location: [facility];  Service: Endoscopy;  Laterality: N/A;  . CORONARY ANGIOPLASTY WITH STENT PLACEMENT  02/15/10   Stent to prox RCA -BMS  . DOPPLER ECHOCARDIOGRAPHY  05/06/2010   EF =50-55%  lvfx low normal ;no sigificant valvular disease seen  . ENDOSCOPIC RETROGRADE CHOLANGIOPANCREATOGRAPHY (ERCP) WITH PROPOFOL N/A 02/29/2020   Procedure: ENDOSCOPIC RETROGRADE CHOLANGIOPANCREATOGRAPHY (ERCP) WITH PROPOFOL;  Surgeon: Arta Silence, MD;  Location: [facility];  Service: Endoscopy;  Laterality: N/A;  . ERCP N/A 03/05/2020   Procedure: ENDOSCOPIC RETROGRADE CHOLANGIOPANCREATOGRAPHY (ERCP);  Surgeon: Clarene Essex, MD;  Location: [facility];  Service: Endoscopy;  Laterality: N/A;  with stent placement  . ESOPHAGOGASTRODUODENOSCOPY (EGD) WITH PROPOFOL N/A 02/29/2020   Procedure: ESOPHAGOGASTRODUODENOSCOPY (EGD) WITH PROPOFOL;  Surgeon: Arta Silence, MD;  Location: [facility];  Service: Endoscopy;  Laterality: N/A;  . EXPLORATORY LAPAROTOMY    . FINE NEEDLE ASPIRATION  02/29/2020   Procedure: FINE NEEDLE ASPIRATION;  Surgeon: Arta Silence, MD;  Location: [facility];  Service: Endoscopy;;  . HERNIA REPAIR  1980   double bilateral  . IR IMAGING GUIDED PORT INSERTION  03/26/2020  . MASS EXCISION  09/02/2012   Procedure: EXCISION MASS;  Surgeon: Earnstine Regal, MD;  Location: [facility];  Service: General;  Laterality: Left;  Excise mass Left posterior neck  . NM MYOCAR PERF WALL MOTION  05/06/2010   exercise cap 8 mets. ,mild perfusion defect in basal infer.,mid infer., apical infer., region consistent with infarct/scar. no significant ischemia  .  PANCREATIC STENT PLACEMENT  02/29/2020   Procedure: PANCREATIC STENT PLACEMENT;  Surgeon: Arta Silence, MD;  Location: [facility];  Service: Endoscopy;;  . right chest wall exploration    . SPHINCTEROTOMY  02/29/2020   Procedure: SPHINCTEROTOMY;  Surgeon: Arta Silence, MD;  Location: [facility];  Service: Endoscopy;;  . [name]  03/05/2020   Procedure: [name];  Surgeon: Clarene Essex, MD;  Location: [facility];  Service: Endoscopy;;  . UPPER ESOPHAGEAL ENDOSCOPIC ULTRASOUND (EUS) N/A 02/29/2020   Procedure: UPPER ESOPHAGEAL ENDOSCOPIC ULTRASOUND (EUS)  LINEAR SCOPE;  Surgeon: Arta Silence, MD;  Location: [facility];  Service: Endoscopy;  Laterality: N/A;  DR. NEEDS TWO HOURS FOR PROCEDURE    I have reviewed the social history and family history with the patient and they are unchanged from previous note.  ALLERGIES:  has No Known Allergies.  MEDICATIONS:  Current Outpatient Medications  Medication Sig Dispense Refill  . ALPRAZolam (XANAX) 0.25 MG tablet Take 1 tablet (0.25 mg total) by mouth at bedtime as needed. (Patient taking differently: Take 0.25 mg by mouth at bedtime. ) 30 tablet 3  . aspirin 81 MG tablet Take 81 mg by mouth at bedtime.     . budesonide (ENTOCORT EC) 3 MG 24 hr capsule Take 3 mg by mouth daily.   5  . cetirizine (ZYRTEC) 10 MG tablet Take 10 mg by mouth daily.    . Cholecalciferol (VITAMIN D3) 5000 units CAPS Take 5,000 Units by mouth at bedtime.     . clopidogrel (PLAVIX) 75 MG tablet Take 75 mg by mouth daily.    Marland Kitchen escitalopram (LEXAPRO) 10 MG tablet Take 1 tablet (10 mg total) by mouth daily. (Patient taking differently: Take 5 mg by mouth in the morning and at bedtime. ) 90 tablet 0  . ibuprofen (ADVIL) 200 MG tablet Take 400 mg by mouth every 5 (five) hours as needed for moderate pain (take w/Percocet).     Marland Kitchen lidocaine-prilocaine (EMLA) cream Apply 1 application topically as directed. Apply to port site 1 hour prior to stick and cover with plastic wrap 30 g 1  . lisinopril (ZESTRIL) 5 MG tablet Take 1 tablet (5 mg total) by mouth daily. 30 tablet 11  . metoprolol tartrate (LOPRESSOR) 50 MG tablet TAKE 1 TABLET(50 MG) BY MOUTH TWICE DAILY (Patient taking differently: Take 25 mg by mouth 2 (two) times daily. ) 180 tablet 1  .  montelukast (SINGULAIR) 10 MG tablet TAKE 1 TABLET(10 MG) BY MOUTH AT BEDTIME (Patient taking differently: Take 10 mg by mouth at bedtime. ) 30 tablet 6  . morphine (MS CONTIN) 15 MG 12 hr tablet Take 1 tablet (15 mg total) by mouth every 12 (twelve) hours. 30 tablet 0  . nitroGLYCERIN (NITROSTAT) 0.4 MG SL tablet PLACE 1 TABLET UNDER THE TONGUE EVERY 5 MINUTES AS NEEDED FOR CHEST PAIN 25 tablet 1  . Omega-3 1000 MG CAPS Take 1,000 mg by mouth at bedtime.    . ondansetron (ZOFRAN) 8 MG tablet Take 1 tablet (8 mg total) by mouth every 8 (eight) hours as needed for nausea or vomiting. 60 tablet 1  . oxyCODONE-acetaminophen (PERCOCET) 10-325 MG tablet Take 1 tablet by mouth every 4 (four) hours as needed for pain. (Patient taking differently: Take 1 tablet by mouth every 5 (five) hours as needed for pain. ) 80 tablet 0  . pantoprazole (PROTONIX) 40 MG tablet Take 1 tablet (40 mg total) by mouth 2 (two) times daily. 180 tablet 3  . polyethylene glycol (  MIRALAX / GLYCOLAX) 17 g packet Take 17 g by mouth daily.     . potassium chloride SA (KLOR-CON) 20 MEQ tablet TAKE 1 TABLET(20 MEQ) BY MOUTH DAILY (Patient taking differently: Take 20 mEq by mouth daily. ) 90 tablet 3  . prochlorperazine (COMPAZINE) 10 MG tablet Take 1 tablet (10 mg total) by mouth every 6 (six) hours as needed for nausea. 60 tablet 1  . rosuvastatin (CRESTOR) 10 MG tablet TAKE 1 TABLET(10 MG) BY MOUTH DAILY (Patient taking differently: Take 10 mg by mouth daily. ) 90 tablet 3   No current facility-administered medications for this visit.   Facility-Administered Medications Ordered in Other Visits  Medication Dose Route Frequency Provider Last Rate Last Admin  . atropine injection 0.5 mg  0.5 mg Intravenous Once PRN Ladell Pier, MD      . fluorouracil (ADRUCIL) 3,600 mg in sodium chloride 0.9 % 78 mL chemo infusion  2,000 mg/m2 (Treatment Plan Recorded) Intravenous 1 day or 1 dose Betsy Coder B, MD      . fosaprepitant (EMEND)  150 mg in sodium chloride 0.9 % 145 mL IVPB  150 mg Intravenous Once Ladell Pier, MD 450 mL/hr at 03/28/20 1243 150 mg at 03/28/20 1243  . irinotecan (CAMPTOSAR) 280 mg in sodium chloride 0.9 % 500 mL chemo infusion  150 mg/m2 (Treatment Plan Recorded) Intravenous Once Ladell Pier, MD      . leucovorin 720 mg in sodium chloride 0.9 % 250 mL infusion  400 mg/m2 (Treatment Plan Recorded) Intravenous Once Ladell Pier, MD      . oxaliplatin (ELOXATIN) 150 mg in dextrose 5 % 500 mL chemo infusion  84 mg/m2 (Treatment Plan Recorded) Intravenous Once Ladell Pier, MD        PHYSICAL EXAMINATION:  Vitals:   03/28/20 1054  BP: 121/74  Pulse: (!) 58  Resp: 18  Temp: 98.5 F (36.9 C)  SpO2: 100%   Filed Weights   03/28/20 1054  Weight: 154 lb 6.4 oz (70 kg)    GENERAL:alert, no distress and comfortable SKIN: scattered erythema to chest and forearms, no obvious rash  EYES: sclera clear LUNGS: clear with normal breathing effort HEART: regular rate & rhythm, no lower extremity edema ABDOMEN: abdomen soft, non-tender and normal bowel sounds NEURO: alert & oriented x 3 with fluent speech, normal gait PAC without erythema   LABORATORY DATA:  I have reviewed the data as listed CBC Latest Ref Rng & Units 03/12/2020 02/13/2019 08/12/2018  WBC 4.0 - 10.5 K/uL 13.0(H) 7.4 7.1  Hemoglobin 12.0 - 15.0 g/dL 11.5(L) 12.7 13.2  Hematocrit 36.0 - 46.0 % 35.6(L) 37.4 38.9  Platelets 150 - 400 K/uL 451(H) 404.0(H) 287.0     CMP Latest Ref Rng & Units 03/28/2020 03/12/2020 02/13/2019  Glucose 70 - 99 mg/dL 113(H) 93 100(H)  BUN 8 - 23 mg/dL 10 9 11   Creatinine 0.44 - 1.00 mg/dL 0.68 0.82 0.81  Sodium 135 - 145 mmol/L 139 140 137  Potassium 3.5 - 5.1 mmol/L 4.5 3.9 4.5  Chloride 98 - 111 mmol/L 108 104 103  CO2 22 - 32 mmol/L 23 25 25   Calcium 8.9 - 10.3 mg/dL 8.7(L) 9.5 9.7  Total Protein 6.5 - 8.1 g/dL 6.2(L) 7.6 7.3  Total Bilirubin 0.3 - 1.2 mg/dL 0.7 2.0(H) 0.5  Alkaline Phos 38  - 126 U/L 152(H) 329(H) 105  AST 15 - 41 U/L 18 40 20  ALT 0 - 44 U/L 15 111(H) 19  RADIOGRAPHIC STUDIES: I have personally reviewed the radiological images as listed and agreed with the findings in the report. IR IMAGING GUIDED PORT INSERTION  Result Date: 03/26/2020 CLINICAL DATA:  Pancreatic carcinoma and need for porta cath to begin chemotherapy. EXAM: IMPLANTED PORT A CATH PLACEMENT WITH ULTRASOUND AND FLUOROSCOPIC GUIDANCE ANESTHESIA/SEDATION: 1.5 mg IV Versed; 75 mcg IV Fentanyl Total Moderate Sedation Time:  30 minutes The patient's level of consciousness and physiologic status were continuously monitored during the procedure by Radiology nursing. Additional Medications: 2 g IV Ancef. FLUOROSCOPY TIME:  24 seconds.  1.4 mGy. PROCEDURE: The procedure, risks, benefits, and alternatives were explained to the patient. Questions regarding the procedure were encouraged and answered. The patient understands and consents to the procedure. A time-out was performed prior to initiating the procedure. Ultrasound was utilized to confirm patency of the right internal jugular vein. The right neck and chest were prepped with chlorhexidine in a sterile fashion, and a sterile drape was applied covering the operative field. Maximum barrier sterile technique with sterile gowns and gloves were used for the procedure. Local anesthesia was provided with 1% lidocaine. After creating a small venotomy incision, a 21 gauge needle was advanced into the right internal jugular vein under direct, real-time ultrasound guidance. Ultrasound image documentation was performed. After securing guidewire access, an 8 Fr dilator was placed. A J-wire was kinked to measure appropriate catheter length. A subcutaneous port pocket was then created along the upper chest wall utilizing sharp and blunt dissection. Portable cautery was utilized. The pocket was irrigated with sterile saline. A single lumen power injectable port was chosen for  placement. The 8 Fr catheter was tunneled from the port pocket site to the venotomy incision. The port was placed in the pocket. External catheter was trimmed to appropriate length based on guidewire measurement. At the venotomy, an 8 Fr peel-away sheath was placed over a guidewire. The catheter was then placed through the sheath and the sheath removed. Final catheter positioning was confirmed and documented with a fluoroscopic spot image. The port was accessed with a needle and aspirated and flushed with heparinized saline. The access needle was removed. The venotomy and port pocket incisions were closed with subcutaneous 3-0 Monocryl and subcuticular 4-0 Vicryl. Dermabond was applied to both incisions. COMPLICATIONS: COMPLICATIONS None FINDINGS: After catheter placement, the tip lies at the cavo-atrial junction. The catheter aspirates normally and is ready for immediate use. IMPRESSION: Placement of single lumen port a cath via right internal jugular vein. The catheter tip lies at the cavo-atrial junction. A power injectable port a cath was placed and is ready for immediate use. Electronically Signed   By: Aletta Edouard M.D.   On: 03/26/2020 15:50     ASSESSMENT & PLAN:   1. 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 MRI abdomen 02/01/2020-2.2 x 3.3 cm mass in the posterior pancreas head/uncinate process, mild intrahepatic/extrahepatic ductal dilatation, no evidence of vascular invasion, possible small lymph nodes in the porta hepatis-poorly visualized ? EUS 02/29/2020-20 x 23 mm mass in the pancreas head, upstream pancreatic duct dilatation, 1 abnormal peripancreatic node, uT3?uN1 ? 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 CTs 03/19/2020-2.9 x 2.1 x 3.1 cm pancreas head/uncinate mass, lesion associated with infrarenal abdominal aorta and superior mesenteric artery, prominent and borderline enlarged retroperitoneal nodes including a left  periaortic node, indeterminate 4 mm lung nodule ? PET scan pending, scheduled on 04/03/20  2.   Abdomen/back pain secondary to #1 3.  Anorexia/weight loss secondary to #1 4.   History of coronary artery disease,STEMI with the fib arrest 2011, status post RCA stent 5.   Constipation, secondary to #1 and morphine   Disposition:  Vicki [name] appears well. She is having constipation despite miralax and dulcolax. We reviewed the potential for constipation from oxaliplatin vs diarrhea from irinotecan. I recommend she try magnesium citrate if needed, start with 1/2 bottle. She knows to call if symptoms worsen or fail to improve. She can use benadryl for skin itching.   She attended chemo teaching session and has anti-emetics at home. She underwent PAC placement and is doing well. We again reviewed expected side effects from chemotherapy, the treatment goal, and symptom management. CMP today shows improved LFTs, hyperbilirubinemia has resolved. We will follow up on her CA 19-9 from today. She is ready to begin cycle 1 FOLFIRINOX chemotherapy today. The patient was seen with Dr. Benay Spice.   She will undergo PET scan on 04/03/20. She has surgical consult with Dr. Barry Dienes in May. She also plans to seek a second opinion at [facility] with medical oncologist Dr. Kelli Churn and surgery.   We will do a virtual toxicity check next week, then follow up in clinic before cycle 2 in 2 weeks.    Orders Placed This Encounter  Procedures  . CBC with Differential (Cancer Center Only)    Standing Status:   Future    Standing Expiration Date:   03/28/2021  . CMP ([place] only)    Standing Status:   Future    Standing Expiration Date:   03/28/2021   All questions were answered. The patient knows to call the clinic with any problems, questions or concerns. No barriers to learning was detected.     Alla Feeling, NP 03/28/20   This was a shared visit with Cira Rue.  The bilirubin has normalized.  The plan is  to begin FOLFIRINOX today.  She will be scheduled for a toxicity check next week and an office visit in 2 weeks.  She is scheduled for a staging PET scan on 04/03/2020.  Julieanne Manson, MD

## 2020-03-28 ENCOUNTER — Encounter: Payer: Self-pay | Admitting: Nurse Practitioner

## 2020-03-28 ENCOUNTER — Inpatient Hospital Stay (HOSPITAL_BASED_OUTPATIENT_CLINIC_OR_DEPARTMENT_OTHER): Payer: 59 | Admitting: Nurse Practitioner

## 2020-03-28 ENCOUNTER — Inpatient Hospital Stay: Payer: 59

## 2020-03-28 ENCOUNTER — Telehealth: Payer: Self-pay | Admitting: Nurse Practitioner

## 2020-03-28 ENCOUNTER — Other Ambulatory Visit: Payer: Self-pay

## 2020-03-28 VITALS — BP 121/74 | HR 58 | Temp 98.5°F | Resp 18 | Ht 66.0 in | Wt 154.4 lb

## 2020-03-28 DIAGNOSIS — Z5111 Encounter for antineoplastic chemotherapy: Secondary | ICD-10-CM | POA: Diagnosis not present

## 2020-03-28 DIAGNOSIS — Z95828 Presence of other vascular implants and grafts: Secondary | ICD-10-CM

## 2020-03-28 DIAGNOSIS — C259 Malignant neoplasm of pancreas, unspecified: Secondary | ICD-10-CM

## 2020-03-28 DIAGNOSIS — C250 Malignant neoplasm of head of pancreas: Secondary | ICD-10-CM

## 2020-03-28 LAB — CMP (CANCER CENTER ONLY)
ALT: 15 U/L (ref 0–44)
AST: 18 U/L (ref 15–41)
Albumin: 3.1 g/dL — ABNORMAL LOW (ref 3.5–5.0)
Alkaline Phosphatase: 152 U/L — ABNORMAL HIGH (ref 38–126)
Anion gap: 8 (ref 5–15)
BUN: 10 mg/dL (ref 8–23)
CO2: 23 mmol/L (ref 22–32)
Calcium: 8.7 mg/dL — ABNORMAL LOW (ref 8.9–10.3)
Chloride: 108 mmol/L (ref 98–111)
Creatinine: 0.68 mg/dL (ref 0.44–1.00)
GFR, Est AFR Am: >60 mL/min (ref >60)
GFR, Estimated: >60 mL/min (ref >60)
Glucose, Bld: 113 mg/dL — ABNORMAL HIGH (ref 70–99)
Potassium: 4.5 mmol/L (ref 3.5–5.1)
Sodium: 139 mmol/L (ref 135–145)
Total Bilirubin: 0.7 mg/dL (ref 0.3–1.2)
Total Protein: 6.2 g/dL — ABNORMAL LOW (ref 6.5–8.1)

## 2020-03-28 MED ORDER — OXALIPLATIN CHEMO INJECTION 100 MG/20ML
84.0000 mg/m2 | Freq: Once | INTRAVENOUS | Status: AC
  Administered 2020-03-28: 150 mg via INTRAVENOUS
  Filled 2020-03-28: qty 30

## 2020-03-28 MED ORDER — SODIUM CHLORIDE 0.9 % IV SOLN
150.0000 mg/m2 | Freq: Once | INTRAVENOUS | Status: AC
  Administered 2020-03-28: 16:00:00 280 mg via INTRAVENOUS
  Filled 2020-03-28: qty 14

## 2020-03-28 MED ORDER — SODIUM CHLORIDE 0.9 % IV SOLN
2000.0000 mg/m2 | INTRAVENOUS | Status: DC
  Administered 2020-03-28: 18:00:00 3600 mg via INTRAVENOUS
  Filled 2020-03-28: qty 72

## 2020-03-28 MED ORDER — SODIUM CHLORIDE 0.9 % IV SOLN
150.0000 mg | Freq: Once | INTRAVENOUS | Status: AC
  Administered 2020-03-28: 13:00:00 150 mg via INTRAVENOUS
  Filled 2020-03-28: qty 150

## 2020-03-28 MED ORDER — SODIUM CHLORIDE 0.9 % IV SOLN
400.0000 mg/m2 | Freq: Once | INTRAVENOUS | Status: AC
  Administered 2020-03-28: 16:00:00 720 mg via INTRAVENOUS
  Filled 2020-03-28: qty 36

## 2020-03-28 MED ORDER — DEXTROSE 5 % IV SOLN
Freq: Once | INTRAVENOUS | Status: AC
  Filled 2020-03-28: qty 250

## 2020-03-28 MED ORDER — PALONOSETRON HCL INJECTION 0.25 MG/5ML
0.2500 mg | Freq: Once | INTRAVENOUS | Status: AC
  Administered 2020-03-28: 12:00:00 0.25 mg via INTRAVENOUS

## 2020-03-28 MED ORDER — SODIUM CHLORIDE 0.9 % IV SOLN
10.0000 mg | Freq: Once | INTRAVENOUS | Status: AC
  Administered 2020-03-28: 10 mg via INTRAVENOUS
  Filled 2020-03-28: qty 10

## 2020-03-28 MED ORDER — PALONOSETRON HCL INJECTION 0.25 MG/5ML
INTRAVENOUS | Status: AC
  Filled 2020-03-28: qty 5

## 2020-03-28 MED ORDER — ATROPINE SULFATE 1 MG/ML IJ SOLN
INTRAMUSCULAR | Status: AC
  Filled 2020-03-28: qty 1

## 2020-03-28 MED ORDER — SODIUM CHLORIDE 0.9% FLUSH
10.0000 mL | INTRAVENOUS | Status: DC | PRN
  Administered 2020-03-28: 10 mL via INTRAVENOUS
  Filled 2020-03-28: qty 10

## 2020-03-28 MED ORDER — ATROPINE SULFATE 1 MG/ML IJ SOLN
0.5000 mg | Freq: Once | INTRAMUSCULAR | Status: AC | PRN
  Administered 2020-03-28: 0.5 mg via INTRAVENOUS

## 2020-03-28 NOTE — Telephone Encounter (Signed)
Scheduled appt per 4/29 los.  Spoke with pt and she is aware of her appt date and time.

## 2020-03-28 NOTE — Patient Instructions (Addendum)
[facility] Discharge Instructions for Patients Receiving Chemotherapy  Today you received the following chemotherapy agents Oxaliplatin, Irinotecan, Leucovorin and Adrucil   To help prevent nausea and vomiting after your treatment, we encourage you to take your nausea medication as directed. No Zofran for the next three days, take Compazine instead.    If you develop nausea and vomiting that is not controlled by your nausea medication, call the clinic.   BELOW ARE SYMPTOMS THAT SHOULD BE REPORTED IMMEDIATELY:  *FEVER GREATER THAN 100.5 F  *CHILLS WITH OR WITHOUT FEVER  NAUSEA AND VOMITING THAT IS NOT CONTROLLED WITH YOUR NAUSEA MEDICATION  *UNUSUAL SHORTNESS OF BREATH  *UNUSUAL BRUISING OR BLEEDING  TENDERNESS IN MOUTH AND THROAT WITH OR WITHOUT PRESENCE OF ULCERS  *URINARY PROBLEMS  *BOWEL PROBLEMS  UNUSUAL RASH Items with * indicate a potential emergency and should be followed up as soon as possible.  Feel free to call the clinic should you have any questions or concerns. The clinic phone number is [phone].  Please show the [facility] at check-in to the Emergency Department and triage nurse.  Oxaliplatin Injection What is this medicine? OXALIPLATIN (ox AL i PLA tin) is a chemotherapy drug. It targets fast dividing cells, like cancer cells, and causes these cells to die. This medicine is used to treat cancers of the colon and rectum, and many other cancers. This medicine may be used for other purposes; ask your health care provider or pharmacist if you have questions. COMMON BRAND NAME(S): Eloxatin What should I tell my health care provider before I take this medicine? They need to know if you have any of these conditions:  heart disease  history of irregular heartbeat  liver disease  low blood counts, like white cells, platelets, or red blood cells  lung or breathing disease, like asthma  take medicines that treat or prevent blood  clots  tingling of the fingers or toes, or other nerve disorder  an unusual or allergic reaction to oxaliplatin, other chemotherapy, other medicines, foods, dyes, or preservatives  pregnant or trying to get pregnant  breast-feeding How should I use this medicine? This drug is given as an infusion into a vein. It is administered in a hospital or clinic by a specially trained health care professional. Talk to your pediatrician regarding the use of this medicine in children. Special care may be needed. Overdosage: If you think you have taken too much of this medicine contact a poison control center or emergency room at once. NOTE: This medicine is only for you. Do not share this medicine with others. What if I miss a dose? It is important not to miss a dose. Call your doctor or health care professional if you are unable to keep an appointment. What may interact with this medicine? Do not take this medicine with any of the following medications:  cisapride  dronedarone  pimozide  thioridazine This medicine may also interact with the following medications:  aspirin and aspirin-like medicines  certain medicines that treat or prevent blood clots like warfarin, apixaban, dabigatran, and rivaroxaban  cisplatin  cyclosporine  diuretics  medicines for infection like acyclovir, adefovir, amphotericin B, bacitracin, cidofovir, foscarnet, ganciclovir, gentamicin, pentamidine, vancomycin  NSAIDs, medicines for pain and inflammation, like ibuprofen or naproxen  other medicines that prolong the QT interval (an abnormal heart rhythm)  pamidronate  zoledronic acid This list may not describe all possible interactions. Give your health care provider a list of all the medicines, herbs, non-prescription  drugs, or dietary supplements you use. Also tell them if you smoke, drink alcohol, or use illegal drugs. Some items may interact with your medicine. What should I watch for while using this  medicine? Your condition will be monitored carefully while you are receiving this medicine. You may need blood work done while you are taking this medicine. This medicine may make you feel generally unwell. This is not uncommon as chemotherapy can affect healthy cells as well as cancer cells. Report any side effects. Continue your course of treatment even though you feel ill unless your healthcare professional tells you to stop. This medicine can make you more sensitive to cold. Do not drink cold drinks or use ice. Cover exposed skin before coming in contact with cold temperatures or cold objects. When out in cold weather wear warm clothing and cover your mouth and nose to warm the air that goes into your lungs. Tell your doctor if you get sensitive to the cold. Do not become pregnant while taking this medicine or for 9 months after stopping it. Women should inform their health care professional if they wish to become pregnant or think they might be pregnant. Men should not father a child while taking this medicine and for 6 months after stopping it. There is potential for serious side effects to an unborn child. Talk to your health care professional for more information. Do not breast-feed a child while taking this medicine or for 3 months after stopping it. This medicine has caused ovarian failure in some women. This medicine may make it more difficult to get pregnant. Talk to your health care professional if you are concerned about your fertility. This medicine has caused decreased sperm counts in some men. This may make it more difficult to father a child. Talk to your health care professional if you are concerned about your fertility. This medicine may increase your risk of getting an infection. Call your health care professional for advice if you get a fever, chills, or sore throat, or other symptoms of a cold or flu. Do not treat yourself. Try to avoid being around people who are sick. Avoid taking  medicines that contain aspirin, acetaminophen, ibuprofen, naproxen, or ketoprofen unless instructed by your health care professional. These medicines may hide a fever. Be careful brushing or flossing your teeth or using a toothpick because you may get an infection or bleed more easily. If you have any dental work done, tell your dentist you are receiving this medicine. What side effects may I notice from receiving this medicine? Side effects that you should report to your doctor or health care professional as soon as possible:  allergic reactions like skin rash, itching or hives, swelling of the face, lips, or tongue  breathing problems  cough  low blood counts - this medicine may decrease the number of white blood cells, red blood cells, and platelets. You may be at increased risk for infections and bleeding  nausea, vomiting  pain, redness, or irritation at site where injected  pain, tingling, numbness in the hands or feet  signs and symptoms of bleeding such as bloody or black, tarry stools; red or dark brown urine; spitting up blood or brown material that looks like coffee grounds; red spots on the skin; unusual bruising or bleeding from the eyes, gums, or nose  signs and symptoms of a dangerous change in heartbeat or heart rhythm like chest pain; dizziness; fast, irregular heartbeat; palpitations; feeling faint or lightheaded; falls  signs and symptoms  of infection like fever; chills; cough; sore throat; pain or trouble passing urine  signs and symptoms of liver injury like dark yellow or brown urine; general ill feeling or flu-like symptoms; light-colored stools; loss of appetite; nausea; right upper belly pain; unusually weak or tired; yellowing of the eyes or skin  signs and symptoms of low red blood cells or anemia such as unusually weak or tired; feeling faint or lightheaded; falls  signs and symptoms of muscle injury like dark urine; trouble passing urine or change in the  amount of urine; unusually weak or tired; muscle pain; back pain Side effects that usually do not require medical attention (report to your doctor or health care professional if they continue or are bothersome):  changes in taste  diarrhea  gas  hair loss  loss of appetite  mouth sores This list may not describe all possible side effects. Call your doctor for medical advice about side effects. You may report side effects to FDA at 1-800-FDA-1088. Where should I keep my medicine? This drug is given in a hospital or clinic and will not be stored at home. NOTE: This sheet is a summary. It may not cover all possible information. If you have questions about this medicine, talk to your doctor, pharmacist, or health care provider.  2020 Elsevier/Gold Standard (2019-04-05 12:20:35)  Irinotecan injection What is this medicine? IRINOTECAN (ir in oh TEE kan ) is a chemotherapy drug. It is used to treat colon and rectal cancer. This medicine may be used for other purposes; ask your health care provider or pharmacist if you have questions. COMMON BRAND NAME(S): Camptosar What should I tell my health care provider before I take this medicine? They need to know if you have any of these conditions:  dehydration  diarrhea  infection (especially a virus infection such as chickenpox, cold sores, or herpes)  liver disease  low blood counts, like low white cell, platelet, or red cell counts  low levels of calcium, magnesium, or potassium in the blood  recent or ongoing radiation therapy  an unusual or allergic reaction to irinotecan, other medicines, foods, dyes, or preservatives  pregnant or trying to get pregnant  breast-feeding How should I use this medicine? This drug is given as an infusion into a vein. It is administered in a hospital or clinic by a specially trained health care professional. Talk to your pediatrician regarding the use of this medicine in children. Special care may  be needed. Overdosage: If you think you have taken too much of this medicine contact a poison control center or emergency room at once. NOTE: This medicine is only for you. Do not share this medicine with others. What if I miss a dose? It is important not to miss your dose. Call your doctor or health care professional if you are unable to keep an appointment. What may interact with this medicine? This medicine may interact with the following medications:  antiviral medicines for HIV or AIDS  certain antibiotics like rifampin or rifabutin  certain medicines for fungal infections like itraconazole, ketoconazole, posaconazole, and voriconazole  certain medicines for seizures like carbamazepine, phenobarbital, phenotoin  clarithromycin  gemfibrozil  nefazodone  St. John's Wort This list may not describe all possible interactions. Give your health care provider a list of all the medicines, herbs, non-prescription drugs, or dietary supplements you use. Also tell them if you smoke, drink alcohol, or use illegal drugs. Some items may interact with your medicine. What should I watch for while  using this medicine? Your condition will be monitored carefully while you are receiving this medicine. You will need important blood work done while you are taking this medicine. This drug may make you feel generally unwell. This is not uncommon, as chemotherapy can affect healthy cells as well as cancer cells. Report any side effects. Continue your course of treatment even though you feel ill unless your doctor tells you to stop. In some cases, you may be given additional medicines to help with side effects. Follow all directions for their use. You may get drowsy or dizzy. Do not drive, use machinery, or do anything that needs mental alertness until you know how this medicine affects you. Do not stand or sit up quickly, especially if you are an older patient. This reduces the risk of dizzy or fainting  spells. Call your health care professional for advice if you get a fever, chills, or sore throat, or other symptoms of a cold or flu. Do not treat yourself. This medicine decreases your body's ability to fight infections. Try to avoid being around people who are sick. Avoid taking products that contain aspirin, acetaminophen, ibuprofen, naproxen, or ketoprofen unless instructed by your doctor. These medicines may hide a fever. This medicine may increase your risk to bruise or bleed. Call your doctor or health care professional if you notice any unusual bleeding. Be careful brushing and flossing your teeth or using a toothpick because you may get an infection or bleed more easily. If you have any dental work done, tell your dentist you are receiving this medicine. Do not become pregnant while taking this medicine or for 6 months after stopping it. Women should inform their health care professional if they wish to become pregnant or think they might be pregnant. Men should not father a child while taking this medicine and for 3 months after stopping it. There is potential for serious side effects to an unborn child. Talk to your health care professional for more information. Do not breast-feed an infant while taking this medicine or for 7 days after stopping it. This medicine has caused ovarian failure in some women. This medicine may make it more difficult to get pregnant. Talk to your health care professional if you are concerned about your fertility. This medicine has caused decreased sperm counts in some men. This may make it more difficult to father a child. Talk to your health care professional if you are concerned about your fertility. What side effects may I notice from receiving this medicine? Side effects that you should report to your doctor or health care professional as soon as possible:  allergic reactions like skin rash, itching or hives, swelling of the face, lips, or tongue  chest  pain  diarrhea  flushing, runny nose, sweating during infusion  low blood counts - this medicine may decrease the number of white blood cells, red blood cells and platelets. You may be at increased risk for infections and bleeding.  nausea, vomiting  pain, swelling, warmth in the leg  signs of decreased platelets or bleeding - bruising, pinpoint red spots on the skin, black, tarry stools, blood in the urine  signs of infection - fever or chills, cough, sore throat, pain or difficulty passing urine  signs of decreased red blood cells - unusually weak or tired, fainting spells, lightheadedness Side effects that usually do not require medical attention (report to your doctor or health care professional if they continue or are bothersome):  constipation  hair loss  headache  loss of appetite  mouth sores  stomach pain This list may not describe all possible side effects. Call your doctor for medical advice about side effects. You may report side effects to FDA at 1-800-FDA-1088. Where should I keep my medicine? This drug is given in a hospital or clinic and will not be stored at home. NOTE: This sheet is a summary. It may not cover all possible information. If you have questions about this medicine, talk to your doctor, pharmacist, or health care provider.  2020 Elsevier/Gold Standard (2019-01-06 10:09:17)  Leucovorin injection What is this medicine? LEUCOVORIN (loo koe VOR in) is used to prevent or treat the harmful effects of some medicines. This medicine is used to treat anemia caused by a low amount of folic acid in the body. It is also used with 5-fluorouracil (5-FU) to treat colon cancer. This medicine may be used for other purposes; ask your health care provider or pharmacist if you have questions. What should I tell my health care provider before I take this medicine? They need to know if you have any of these conditions:  anemia from low levels of vitamin B-12 in the  blood  an unusual or allergic reaction to leucovorin, folic acid, other medicines, foods, dyes, or preservatives  pregnant or trying to get pregnant  breast-feeding How should I use this medicine? This medicine is for injection into a muscle or into a vein. It is given by a health care professional in a hospital or clinic setting. Talk to your pediatrician regarding the use of this medicine in children. Special care may be needed. Overdosage: If you think you have taken too much of this medicine contact a poison control center or emergency room at once. NOTE: This medicine is only for you. Do not share this medicine with others. What if I miss a dose? This does not apply. What may interact with this medicine?  capecitabine  fluorouracil  phenobarbital  phenytoin  primidone  trimethoprim-sulfamethoxazole This list may not describe all possible interactions. Give your health care provider a list of all the medicines, herbs, non-prescription drugs, or dietary supplements you use. Also tell them if you smoke, drink alcohol, or use illegal drugs. Some items may interact with your medicine. What should I watch for while using this medicine? Your condition will be monitored carefully while you are receiving this medicine. This medicine may increase the side effects of 5-fluorouracil, 5-FU. Tell your doctor or health care professional if you have diarrhea or mouth sores that do not get better or that get worse. What side effects may I notice from receiving this medicine? Side effects that you should report to your doctor or health care professional as soon as possible:  allergic reactions like skin rash, itching or hives, swelling of the face, lips, or tongue  breathing problems  fever, infection  mouth sores  unusual bleeding or bruising  unusually weak or tired Side effects that usually do not require medical attention (report to your doctor or health care professional if they  continue or are bothersome):  constipation or diarrhea  loss of appetite  nausea, vomiting This list may not describe all possible side effects. Call your doctor for medical advice about side effects. You may report side effects to FDA at 1-800-FDA-1088. Where should I keep my medicine? This drug is given in a hospital or clinic and will not be stored at home. NOTE: This sheet is a summary. It may not cover all possible information. If you  have questions about this medicine, talk to your doctor, pharmacist, or health care provider.  2020 Elsevier/Gold Standard (2008-05-22 16:50:29)  Fluorouracil, 5-FU injection What is this medicine? FLUOROURACIL, 5-FU (flure oh YOOR a sil) is a chemotherapy drug. It slows the growth of cancer cells. This medicine is used to treat many types of cancer like breast cancer, colon or rectal cancer, pancreatic cancer, and stomach cancer. This medicine may be used for other purposes; ask your health care provider or pharmacist if you have questions. COMMON BRAND NAME(S): Adrucil What should I tell my health care provider before I take this medicine? They need to know if you have any of these conditions:  blood disorders  dihydropyrimidine dehydrogenase (DPD) deficiency  infection (especially a virus infection such as chickenpox, cold sores, or herpes)  kidney disease  liver disease  malnourished, poor nutrition  recent or ongoing radiation therapy  an unusual or allergic reaction to fluorouracil, other chemotherapy, other medicines, foods, dyes, or preservatives  pregnant or trying to get pregnant  breast-feeding How should I use this medicine? This drug is given as an infusion or injection into a vein. It is administered in a hospital or clinic by a specially trained health care professional. Talk to your pediatrician regarding the use of this medicine in children. Special care may be needed. Overdosage: If you think you have taken too much of  this medicine contact a poison control center or emergency room at once. NOTE: This medicine is only for you. Do not share this medicine with others. What if I miss a dose? It is important not to miss your dose. Call your doctor or health care professional if you are unable to keep an appointment. What may interact with this medicine?  allopurinol  cimetidine  dapsone  digoxin  hydroxyurea  leucovorin  levamisole  medicines for seizures like ethotoin, fosphenytoin, phenytoin  medicines to increase blood counts like filgrastim, pegfilgrastim, sargramostim  medicines that treat or prevent blood clots like warfarin, enoxaparin, and dalteparin  methotrexate  metronidazole  pyrimethamine  some other chemotherapy drugs like busulfan, cisplatin, estramustine, vinblastine  trimethoprim  trimetrexate  vaccines Talk to your doctor or health care professional before taking any of these medicines:  acetaminophen  aspirin  ibuprofen  ketoprofen  naproxen This list may not describe all possible interactions. Give your health care provider a list of all the medicines, herbs, non-prescription drugs, or dietary supplements you use. Also tell them if you smoke, drink alcohol, or use illegal drugs. Some items may interact with your medicine. What should I watch for while using this medicine? Visit your doctor for checks on your progress. This drug may make you feel generally unwell. This is not uncommon, as chemotherapy can affect healthy cells as well as cancer cells. Report any side effects. Continue your course of treatment even though you feel ill unless your doctor tells you to stop. In some cases, you may be given additional medicines to help with side effects. Follow all directions for their use. Call your doctor or health care professional for advice if you get a fever, chills or sore throat, or other symptoms of a cold or flu. Do not treat yourself. This drug decreases  your body's ability to fight infections. Try to avoid being around people who are sick. This medicine may increase your risk to bruise or bleed. Call your doctor or health care professional if you notice any unusual bleeding. Be careful brushing and flossing your teeth or using a toothpick  because you may get an infection or bleed more easily. If you have any dental work done, tell your dentist you are receiving this medicine. Avoid taking products that contain aspirin, acetaminophen, ibuprofen, naproxen, or ketoprofen unless instructed by your doctor. These medicines may hide a fever. Do not become pregnant while taking this medicine. Women should inform their doctor if they wish to become pregnant or think they might be pregnant. There is a potential for serious side effects to an unborn child. Talk to your health care professional or pharmacist for more information. Do not breast-feed an infant while taking this medicine. Men should inform their doctor if they wish to father a child. This medicine may lower sperm counts. Do not treat diarrhea with over the counter products. Contact your doctor if you have diarrhea that lasts more than 2 days or if it is severe and watery. This medicine can make you more sensitive to the sun. Keep out of the sun. If you cannot avoid being in the sun, wear protective clothing and use sunscreen. Do not use sun lamps or tanning beds/booths. What side effects may I notice from receiving this medicine? Side effects that you should report to your doctor or health care professional as soon as possible:  allergic reactions like skin rash, itching or hives, swelling of the face, lips, or tongue  low blood counts - this medicine may decrease the number of white blood cells, red blood cells and platelets. You may be at increased risk for infections and bleeding.  signs of infection - fever or chills, cough, sore throat, pain or difficulty passing urine  signs of decreased  platelets or bleeding - bruising, pinpoint red spots on the skin, black, tarry stools, blood in the urine  signs of decreased red blood cells - unusually weak or tired, fainting spells, lightheadedness  breathing problems  changes in vision  chest pain  mouth sores  nausea and vomiting  pain, swelling, redness at site where injected  pain, tingling, numbness in the hands or feet  redness, swelling, or sores on hands or feet  stomach pain  unusual bleeding Side effects that usually do not require medical attention (report to your doctor or health care professional if they continue or are bothersome):  changes in finger or toe nails  diarrhea  dry or itchy skin  hair loss  headache  loss of appetite  sensitivity of eyes to the light  stomach upset  unusually teary eyes This list may not describe all possible side effects. Call your doctor for medical advice about side effects. You may report side effects to FDA at 1-800-FDA-1088. Where should I keep my medicine? This drug is given in a hospital or clinic and will not be stored at home. NOTE: This sheet is a summary. It may not cover all possible information. If you have questions about this medicine, talk to your doctor, pharmacist, or health care provider.  2020 Elsevier/Gold Standard (2008-03-21 13:53:16)

## 2020-03-28 NOTE — Progress Notes (Signed)
Ok to treat with no CBC today per Cira Rue, NP  Per MD Benay Spice, ok to d/c pump at 1400 on 5/1 and discard remainder.

## 2020-03-29 ENCOUNTER — Telehealth: Payer: Self-pay | Admitting: *Deleted

## 2020-03-29 ENCOUNTER — Ambulatory Visit (HOSPITAL_COMMUNITY): Payer: 59

## 2020-03-29 LAB — CANCER ANTIGEN 19-9: CA 19-9: 57 U/mL — ABNORMAL HIGH (ref 0–35)

## 2020-03-29 NOTE — Telephone Encounter (Signed)
-----   Message from Veverly Fells, RN sent at 03/28/2020  4:36 PM EDT ----- Regarding: SHERRILL: First time follow-up First time Folfirinox today. Tolerated well without complaints

## 2020-03-29 NOTE — Telephone Encounter (Signed)
Called pt to see how she is doing post chemo.  She was in bed resting.  She reports some nausea yest & took medication & she just took nausea med this am & is resting b/c she felt a little dizzy.  She reports some taste issues.  She still has not had a good BM & has taken miralax but discussed possible diarrhea with chemo & to back off laxatives if this occurs.  Discussed aspirin & NSAID's.  Pt knows to call with any questions/concerns.

## 2020-03-30 ENCOUNTER — Inpatient Hospital Stay: Payer: 59 | Attending: Oncology

## 2020-03-30 ENCOUNTER — Other Ambulatory Visit: Payer: Self-pay

## 2020-03-30 VITALS — BP 154/85 | HR 63 | Temp 99.1°F | Resp 18

## 2020-03-30 DIAGNOSIS — C250 Malignant neoplasm of head of pancreas: Secondary | ICD-10-CM | POA: Diagnosis present

## 2020-03-30 DIAGNOSIS — Z5111 Encounter for antineoplastic chemotherapy: Secondary | ICD-10-CM | POA: Diagnosis present

## 2020-03-30 DIAGNOSIS — R110 Nausea: Secondary | ICD-10-CM | POA: Insufficient documentation

## 2020-03-30 DIAGNOSIS — Z5189 Encounter for other specified aftercare: Secondary | ICD-10-CM | POA: Diagnosis not present

## 2020-03-30 MED ORDER — ONDANSETRON HCL 4 MG/2ML IJ SOLN
INTRAMUSCULAR | Status: AC
  Filled 2020-03-30: qty 4

## 2020-03-30 MED ORDER — HEPARIN SOD (PORK) LOCK FLUSH 100 UNIT/ML IV SOLN
500.0000 [IU] | Freq: Once | INTRAVENOUS | Status: AC | PRN
  Administered 2020-03-30: 500 [IU]
  Filled 2020-03-30: qty 5

## 2020-03-30 MED ORDER — ONDANSETRON HCL 4 MG/2ML IJ SOLN
8.0000 mg | Freq: Once | INTRAMUSCULAR | Status: AC
  Administered 2020-03-30: 14:00:00 8 mg via INTRAVENOUS

## 2020-03-30 MED ORDER — SODIUM CHLORIDE 0.9% FLUSH
10.0000 mL | INTRAVENOUS | Status: DC | PRN
  Administered 2020-03-30: 10 mL
  Filled 2020-03-30: qty 10

## 2020-03-30 NOTE — Progress Notes (Signed)
Pt verbalized some improvement of nausea after administration of IV zofran. Reinforced prn antiemetic schedule, and encouraged pt drink fluids at home, even if unable to eat, to prevent dehydration. Encouraged her to call after hours number or go to ED if symptoms persist through the weekend.

## 2020-03-30 NOTE — Progress Notes (Signed)
Pt. arrived to unit having nausea and small amount of emesis. Pt. states she has been taking compazine 10 mg po every 6 hours since her treatment 03/28/20, but it hasn't been working. She last took compazine at 8:30 this morning. Dr. Alvy Bimler notified and new order received for Zofran 8 mg IV.

## 2020-03-31 ENCOUNTER — Encounter (HOSPITAL_COMMUNITY): Payer: Self-pay | Admitting: Emergency Medicine

## 2020-03-31 ENCOUNTER — Observation Stay (HOSPITAL_COMMUNITY)
Admission: EM | Admit: 2020-03-31 | Discharge: 2020-04-01 | Disposition: A | Discharge status: Home / Self Care | Payer: 59 | Attending: Internal Medicine | Admitting: Internal Medicine

## 2020-03-31 ENCOUNTER — Other Ambulatory Visit: Payer: Self-pay

## 2020-03-31 DIAGNOSIS — E876 Hypokalemia: Secondary | ICD-10-CM | POA: Diagnosis present

## 2020-03-31 DIAGNOSIS — Z955 Presence of coronary angioplasty implant and graft: Secondary | ICD-10-CM | POA: Diagnosis not present

## 2020-03-31 DIAGNOSIS — Z7982 Long term (current) use of aspirin: Secondary | ICD-10-CM | POA: Insufficient documentation

## 2020-03-31 DIAGNOSIS — I10 Essential (primary) hypertension: Secondary | ICD-10-CM | POA: Diagnosis not present

## 2020-03-31 DIAGNOSIS — I2510 Atherosclerotic heart disease of native coronary artery without angina pectoris: Secondary | ICD-10-CM | POA: Diagnosis present

## 2020-03-31 DIAGNOSIS — T451X5A Adverse effect of antineoplastic and immunosuppressive drugs, initial encounter: Secondary | ICD-10-CM | POA: Diagnosis present

## 2020-03-31 DIAGNOSIS — Z8249 Family history of ischemic heart disease and other diseases of the circulatory system: Secondary | ICD-10-CM | POA: Diagnosis not present

## 2020-03-31 DIAGNOSIS — Z20822 Contact with and (suspected) exposure to covid-19: Secondary | ICD-10-CM | POA: Insufficient documentation

## 2020-03-31 DIAGNOSIS — Z7952 Long term (current) use of systemic steroids: Secondary | ICD-10-CM | POA: Insufficient documentation

## 2020-03-31 DIAGNOSIS — C259 Malignant neoplasm of pancreas, unspecified: Secondary | ICD-10-CM | POA: Diagnosis not present

## 2020-03-31 DIAGNOSIS — E785 Hyperlipidemia, unspecified: Secondary | ICD-10-CM | POA: Diagnosis present

## 2020-03-31 DIAGNOSIS — Z79899 Other long term (current) drug therapy: Secondary | ICD-10-CM | POA: Diagnosis not present

## 2020-03-31 DIAGNOSIS — G893 Neoplasm related pain (acute) (chronic): Secondary | ICD-10-CM | POA: Diagnosis not present

## 2020-03-31 DIAGNOSIS — Z87891 Personal history of nicotine dependence: Secondary | ICD-10-CM | POA: Diagnosis not present

## 2020-03-31 DIAGNOSIS — K56609 Unspecified intestinal obstruction, unspecified as to partial versus complete obstruction: Secondary | ICD-10-CM

## 2020-03-31 DIAGNOSIS — F329 Major depressive disorder, single episode, unspecified: Secondary | ICD-10-CM | POA: Diagnosis not present

## 2020-03-31 DIAGNOSIS — I252 Old myocardial infarction: Secondary | ICD-10-CM | POA: Insufficient documentation

## 2020-03-31 DIAGNOSIS — Z90722 Acquired absence of ovaries, bilateral: Secondary | ICD-10-CM | POA: Insufficient documentation

## 2020-03-31 DIAGNOSIS — Z8674 Personal history of sudden cardiac arrest: Secondary | ICD-10-CM | POA: Diagnosis not present

## 2020-03-31 DIAGNOSIS — K219 Gastro-esophageal reflux disease without esophagitis: Secondary | ICD-10-CM | POA: Insufficient documentation

## 2020-03-31 DIAGNOSIS — R112 Nausea with vomiting, unspecified: Secondary | ICD-10-CM | POA: Diagnosis not present

## 2020-03-31 MED ORDER — SODIUM CHLORIDE 0.9% FLUSH
3.0000 mL | Freq: Once | INTRAVENOUS | Status: AC
  Administered 2020-04-01: 3 mL via INTRAVENOUS

## 2020-03-31 NOTE — ED Triage Notes (Signed)
Patient is a cancer patient and has been throwing up since Friday night. Patient states her last chemo treatment was Saturday.

## 2020-03-31 NOTE — ED Provider Notes (Signed)
[facility] DEPT Provider Note   CSN: [id number] Arrival date & time: 03/31/20  2328     History Chief Complaint  Patient presents with  . Emesis    Vicki [name] is a 64 y.o. female.  Patient with history of CAD status post stents, previous cardiac arrest, pancreatic cancer recently diagnosed presenting with intractable nausea and vomiting.  States she underwent her first chemo session 3 days ago which was a 48-hour infusion.  This was discontinued at 2 PM on May 1.  She has had intractable nausea and vomiting since then despite multiple doses of antiemetics including Compazine and Zofran.  States that them to keep anything down at home including water and Gatorade.  Came to the hospital at the request of her oncologist.  She has had no diarrhea.  No chest pain or shortness of breath.  No fever.  States she has chronic pain from her cancer in her abdomen and back which is unchanged.  She states this is her first chemotherapy session. Systemic chemotherapy, FOLFIRINOX q2 weeks starting 03/28/2020   The history is provided by the patient.       Past Medical History:  Diagnosis Date  . CAD (coronary artery disease) 2011   with stent to RCA and 60 % LAD disease  . Clotting disorder ([place])   . Family history of breast cancer   . Family history of cystic fibrosis   . H/O cardiac arrest 02/15/10   with STEMI- Inf wall  . Hyperlipidemia   . Hypertension   . Myocardial infarct ([place]) 02/15/10  . pancreatic ca dx'd 02/2020  . Shock, cardiogenic ([place]) 01/2010   with MI, IABP    Patient Active Problem List   Diagnosis Date Noted  . Pancreatic cancer ([place]) 03/20/2020  . Goals of care, counseling/discussion 03/20/2020  . Family history of breast cancer   . Family history of cystic fibrosis   . Foot pain, right 11/25/2018  . Collagenous colitis 02/08/2018  . Family history of Alzheimer's disease 02/08/2018  . Psoriasis 02/12/2015  . Sacroiliac joint  dysfunction of left side 02/12/2015  . Bradycardia, symptomatic with fatigue 09/13/2013  . Low back pain 02/23/2013  . CAD (coronary atherosclerotic disease), STEMI 2011 with emergent BMS to RCA, residual 60 % LAD disease.  Associated V fib arrest. 05/30/2012  . Physical exam 05/04/2012  . Allergic rhinitis, seasonal 03/25/2012  . Hyperlipidemia 06/19/2010  . Essential hypertension 06/19/2010  . MYOCARDIAL INFARCTION, HX OF 06/19/2010    Past Surgical History:  Procedure Laterality Date  . ABDOMINAL HYSTERECTOMY  1987   BSO  . APPENDECTOMY  1987  . BILIARY STENT PLACEMENT N/A 03/05/2020   Procedure: BILIARY STENT PLACEMENT;  Surgeon: Clarene Essex, MD;  Location: [facility];  Service: Endoscopy;  Laterality: N/A;  . CORONARY ANGIOPLASTY WITH STENT PLACEMENT  02/15/10   Stent to prox RCA -BMS  . DOPPLER ECHOCARDIOGRAPHY  05/06/2010   EF =50-55%  lvfx low normal ;no sigificant valvular disease seen  . ENDOSCOPIC RETROGRADE CHOLANGIOPANCREATOGRAPHY (ERCP) WITH PROPOFOL N/A 02/29/2020   Procedure: ENDOSCOPIC RETROGRADE CHOLANGIOPANCREATOGRAPHY (ERCP) WITH PROPOFOL;  Surgeon: Arta Silence, MD;  Location: [facility];  Service: Endoscopy;  Laterality: N/A;  . ERCP N/A 03/05/2020   Procedure: ENDOSCOPIC RETROGRADE CHOLANGIOPANCREATOGRAPHY (ERCP);  Surgeon: Clarene Essex, MD;  Location: [facility];  Service: Endoscopy;  Laterality: N/A;  with stent placement  . ESOPHAGOGASTRODUODENOSCOPY (EGD) WITH PROPOFOL N/A 02/29/2020   Procedure: ESOPHAGOGASTRODUODENOSCOPY (EGD) WITH PROPOFOL;  Surgeon: Arta Silence, MD;  Location: [facility];  Service: Endoscopy;  Laterality: N/A;  . EXPLORATORY LAPAROTOMY    . FINE NEEDLE ASPIRATION  02/29/2020   Procedure: FINE NEEDLE ASPIRATION;  Surgeon: Arta Silence, MD;  Location: [facility];  Service: Endoscopy;;  . HERNIA REPAIR  1980   double bilateral  . IR IMAGING GUIDED PORT INSERTION  03/26/2020  . MASS EXCISION  09/02/2012   Procedure: EXCISION MASS;   Surgeon: Earnstine Regal, MD;  Location: [facility];  Service: General;  Laterality: Left;  Excise mass Left posterior neck  . NM MYOCAR PERF WALL MOTION  05/06/2010   exercise cap 8 mets. ,mild perfusion defect in basal infer.,mid infer., apical infer., region consistent with infarct/scar. no significant ischemia  . PANCREATIC STENT PLACEMENT  02/29/2020   Procedure: PANCREATIC STENT PLACEMENT;  Surgeon: Arta Silence, MD;  Location: [facility];  Service: Endoscopy;;  . right chest wall exploration    . SPHINCTEROTOMY  02/29/2020   Procedure: SPHINCTEROTOMY;  Surgeon: Arta Silence, MD;  Location: [facility];  Service: Endoscopy;;  . [name]  03/05/2020   Procedure: [name];  Surgeon: Clarene Essex, MD;  Location: [facility];  Service: Endoscopy;;  . UPPER ESOPHAGEAL ENDOSCOPIC ULTRASOUND (EUS) N/A 02/29/2020   Procedure: UPPER ESOPHAGEAL ENDOSCOPIC ULTRASOUND (EUS)  LINEAR SCOPE;  Surgeon: Arta Silence, MD;  Location: [facility];  Service: Endoscopy;  Laterality: N/A;  DR. NEEDS TWO HOURS FOR PROCEDURE     OB History   No obstetric history on file.     Family History  Problem Relation Age of Onset  . Breast cancer Mother 18  . Dementia Mother   . Cystic fibrosis Father 3       late onset dx  . Healthy Brother   . Dementia Maternal Aunt   . Dementia Maternal Uncle   . Other Paternal Uncle        MVA while in the [employer]  . Arthritis Maternal Grandmother   . AAA (abdominal aortic aneurysm) Maternal Grandfather   . Other Paternal 32        old age  . Heart attack Paternal Grandfather   . Liver disease Paternal Uncle   . Pancreatitis Son        several bouts of pancreatitis  . Breast cancer Cousin 69       mat first cousin  . Colon cancer Neg Hx     Social History   Tobacco Use  . Smoking status: Former Smoker    Quit date: 01/28/2010    Years since quitting: 10.1  . Smokeless tobacco: Never Used  Substance Use Topics  .  Alcohol use: Yes    Alcohol/week: 1.0 - 2.0 standard drinks    Types: 1 - 2 Glasses of wine per week  . Drug use: No    Home Medications Prior to Admission medications   Medication Sig Start Date End Date Taking? Authorizing Provider  ALPRAZolam (XANAX) 0.25 MG tablet Take 1 tablet (0.25 mg total) by mouth at bedtime as needed. Patient taking differently: Take 0.25 mg by mouth at bedtime.  11/02/19   Midge Minium, MD  aspirin 81 MG tablet Take 81 mg by mouth at bedtime.     [provider]  budesonide (ENTOCORT EC) 3 MG 24 hr capsule Take 3 mg by mouth daily.  01/26/18   [provider]  cetirizine (ZYRTEC) 10 MG tablet Take 10 mg by mouth daily.    [provider]  Cholecalciferol (VITAMIN D3) 5000 units CAPS  Take 5,000 Units by mouth at bedtime.     [provider]  clopidogrel (PLAVIX) 75 MG tablet Take 75 mg by mouth daily.    [provider]  escitalopram (LEXAPRO) 10 MG tablet Take 1 tablet (10 mg total) by mouth daily. Patient taking differently: Take 5 mg by mouth in the morning and at bedtime.  01/04/20   Midge Minium, MD  ibuprofen (ADVIL) 200 MG tablet Take 400 mg by mouth every 5 (five) hours as needed for moderate pain (take w/Percocet).     [provider]  lidocaine-prilocaine (EMLA) cream Apply 1 application topically as directed. Apply to port site 1 hour prior to stick and cover with plastic wrap 03/25/20   Ladell Pier, MD  lisinopril (ZESTRIL) 5 MG tablet Take 1 tablet (5 mg total) by mouth daily. 11/29/19   Kroeger, Lorelee Cover., PA-C  metoprolol tartrate (LOPRESSOR) 50 MG tablet TAKE 1 TABLET(50 MG) BY MOUTH TWICE DAILY Patient taking differently: Take 25 mg by mouth 2 (two) times daily.  02/20/19   Lorretta Harp, MD  montelukast (SINGULAIR) 10 MG tablet TAKE 1 TABLET(10 MG) BY MOUTH AT BEDTIME Patient taking differently: Take 10 mg by mouth at bedtime.  11/02/19   Midge Minium, MD  morphine (MS  CONTIN) 15 MG 12 hr tablet Take 1 tablet (15 mg total) by mouth every 12 (twelve) hours. 03/20/20   Ladell Pier, MD  nitroGLYCERIN (NITROSTAT) 0.4 MG SL tablet PLACE 1 TABLET UNDER THE TONGUE EVERY 5 MINUTES AS NEEDED FOR CHEST PAIN 11/29/19   Lorretta Harp, MD  Omega-3 1000 MG CAPS Take 1,000 mg by mouth at bedtime.    [provider]  ondansetron (ZOFRAN) 8 MG tablet Take 1 tablet (8 mg total) by mouth every 8 (eight) hours as needed for nausea or vomiting. 03/12/20   Ladell Pier, MD  oxyCODONE-acetaminophen (PERCOCET) 10-325 MG tablet Take 1 tablet by mouth every 4 (four) hours as needed for pain. Patient taking differently: Take 1 tablet by mouth every 5 (five) hours as needed for pain.  03/12/20   Ladell Pier, MD  pantoprazole (PROTONIX) 40 MG tablet Take 1 tablet (40 mg total) by mouth 2 (two) times daily. 11/30/19   Lorretta Harp, MD  polyethylene glycol (MIRALAX / GLYCOLAX) 17 g packet Take 17 g by mouth daily.     [provider]  potassium chloride SA (KLOR-CON) 20 MEQ tablet TAKE 1 TABLET(20 MEQ) BY MOUTH DAILY Patient taking differently: Take 20 mEq by mouth daily.  03/15/20   Lorretta Harp, MD  prochlorperazine (COMPAZINE) 10 MG tablet Take 1 tablet (10 mg total) by mouth every 6 (six) hours as needed for nausea. 03/25/20   Ladell Pier, MD  rosuvastatin (CRESTOR) 10 MG tablet TAKE 1 TABLET(10 MG) BY MOUTH DAILY Patient taking differently: Take 10 mg by mouth daily.  01/03/20   Lorretta Harp, MD    Allergies    Patient has no known allergies.  Review of Systems   Review of Systems  Constitutional: Positive for activity change, appetite change and fatigue. Negative for fever.  HENT: Negative for congestion and postnasal drip.   Respiratory: Negative for cough, chest tightness and shortness of breath.   Cardiovascular: Negative for chest pain.  Gastrointestinal: Positive for nausea and vomiting. Negative for abdominal pain.    Genitourinary: Negative for dysuria and hematuria.  Musculoskeletal: Negative for arthralgias and myalgias.  Skin: Negative for rash.  Neurological:  Positive for weakness. Negative for dizziness, light-headedness and headaches.   all other systems are negative except as noted in the HPI and PMH.    Physical Exam Updated Vital Signs BP 124/72 (BP Location: Left Arm)   Pulse 66   Temp 98.7 F (37.1 C) (Oral)   Resp 16   Ht 5\' 6"  (1.676 m)   Wt 65.8 kg   SpO2 98%   BMI 23.40 kg/m   Physical Exam Vitals and nursing note reviewed.  Constitutional:      General: She is not in acute distress.    Appearance: She is well-developed.  HENT:     Head: Normocephalic and atraumatic.     Mouth/Throat:     Mouth: Mucous membranes are dry.     Pharynx: No oropharyngeal exudate.  Eyes:     Conjunctiva/sclera: Conjunctivae normal.     Pupils: Pupils are equal, round, and reactive to light.  Neck:     Comments: No meningismus. Cardiovascular:     Rate and Rhythm: Normal rate and regular rhythm.     Heart sounds: Normal heart sounds. No murmur.     Comments: Port in place Pulmonary:     Effort: Pulmonary effort is normal. No respiratory distress.     Breath sounds: Normal breath sounds.  Abdominal:     Palpations: Abdomen is soft.     Tenderness: There is abdominal tenderness. There is no guarding or rebound.     Comments: Mildly diffuse abdominal tenderness, no guarding or rebound  Musculoskeletal:        General: No tenderness. Normal range of motion.     Cervical back: Normal range of motion and neck supple.  Skin:    General: Skin is warm.     Capillary Refill: Capillary refill takes less than 2 seconds.  Neurological:     General: No focal deficit present.     Mental Status: She is alert and oriented to person, place, and time. Mental status is at baseline.     Cranial Nerves: No cranial nerve deficit.     Motor: No abnormal muscle tone.     Coordination: Coordination  normal.     Comments:  5/5 strength throughout. CN 2-12 intact.Equal grip strength.   Psychiatric:        Behavior: Behavior normal.     ED Results / Procedures / Treatments   Labs (all labs ordered are listed, but only abnormal results are displayed) Labs Reviewed  COMPREHENSIVE METABOLIC PANEL - Abnormal; Notable for the following components:      Result Value   Potassium 3.4 (*)    Calcium 8.8 (*)    Total Protein 6.3 (*)    Albumin 3.4 (*)    Total Bilirubin 1.4 (*)    All other components within normal limits  CBC - Abnormal; Notable for the following components:   RBC 3.62 (*)    Hemoglobin 11.3 (*)    HCT 33.4 (*)    All other components within normal limits  URINALYSIS, ROUTINE W REFLEX MICROSCOPIC - Abnormal; Notable for the following components:   pH 9.0 (*)    Ketones, ur 80 (*)    All other components within normal limits  CBC WITH DIFFERENTIAL/PLATELET - Abnormal; Notable for the following components:   RBC 3.45 (*)    Hemoglobin 10.7 (*)    HCT 32.2 (*)    Eosinophils Absolute 1.9 (*)    All other components within normal limits  COMPREHENSIVE METABOLIC PANEL - Abnormal;  Notable for the following components:   Potassium 3.4 (*)    Calcium 8.2 (*)    Total Protein 5.7 (*)    Albumin 3.1 (*)    All other components within normal limits  RESPIRATORY PANEL BY RT PCR (FLU A&B, COVID)  LIPASE, BLOOD  LACTIC ACID, PLASMA  MAGNESIUM  HIV ANTIBODY (ROUTINE TESTING W REFLEX)  TROPONIN I (HIGH SENSITIVITY)  TROPONIN I (HIGH SENSITIVITY)    EKG EKG Interpretation  Date/Time:  Monday Apr 01 2020 00:01:53 EDT Ventricular Rate:  60 PR Interval:    QRS Duration: 97 QT Interval:  462 QTC Calculation: 462 R Axis:   34 Text Interpretation: Sinus rhythm Nonspecific T wave abnormality new T wave inversions III, AVF, v3 Confirmed by Ezequiel Essex [phone]) on 04/01/2020 12:16:57 AM   Radiology DG Abd 2 Views  Result Date: 04/01/2020 CLINICAL DATA:  Bowel  obstruction EXAM: ABDOMEN - 2 VIEW COMPARISON:  Abdominal CT 03/19/2020 FINDINGS: Formed stool throughout the distal colon. No evidence of small bowel obstruction. No evidence of pneumoperitoneum. Metallic CBD stent. Clear lung bases. IMPRESSION: 1. Nonobstructive bowel gas pattern. 2. Stool throughout the distal colon. Electronically Signed   By: Monte Fantasia M.D.   On: 04/01/2020 05:35    Procedures Procedures (including critical care time)  Medications Ordered in ED Medications  sodium chloride flush (NS) 0.9 % injection 3 mL (has no administration in time range)    ED Course  I have reviewed the triage vital signs and the nursing notes.  Pertinent labs & imaging results that were available during my care of the patient were reviewed by me and considered in my medical decision making (see chart for details).    MDM Rules/Calculators/A&P                     Cancer patient here with intractable nausea and vomiting after her first chemotherapy session.  She has stable vital signs and a soft abdomen.  Labs are reassuring.  Lactate is normal.  Creatinine is normal.  Labs show a large ketones in urine.  No infection.  Patient complains of pain and nausea.  IVF and antiemetics given. EKG unchanged. QT wnl.   Patient still with nausea and inability to tolerate PO. Dry mucus membranes on exam with ketones in UA. Plan continued hydration and symptom control overnight. Abdomen soft, defer imaging for now.  D/w Dr. Marlyce Huge  Final Clinical Impression(s) / ED Diagnoses Final diagnoses:  Intractable nausea and vomiting  Chemotherapy-induced nausea and vomiting    Rx / DC Orders ED Discharge Orders    None       Lenford Beddow, Annie Main, MD 04/01/20 [phone]

## 2020-04-01 ENCOUNTER — Encounter (HOSPITAL_COMMUNITY): Payer: Self-pay | Admitting: Internal Medicine

## 2020-04-01 ENCOUNTER — Other Ambulatory Visit: Payer: Self-pay

## 2020-04-01 ENCOUNTER — Observation Stay (HOSPITAL_COMMUNITY): Payer: 59

## 2020-04-01 ENCOUNTER — Telehealth: Payer: Self-pay | Admitting: Nurse Practitioner

## 2020-04-01 DIAGNOSIS — G893 Neoplasm related pain (acute) (chronic): Secondary | ICD-10-CM | POA: Diagnosis not present

## 2020-04-01 DIAGNOSIS — I2510 Atherosclerotic heart disease of native coronary artery without angina pectoris: Secondary | ICD-10-CM

## 2020-04-01 DIAGNOSIS — E785 Hyperlipidemia, unspecified: Secondary | ICD-10-CM | POA: Diagnosis not present

## 2020-04-01 DIAGNOSIS — K5900 Constipation, unspecified: Secondary | ICD-10-CM

## 2020-04-01 DIAGNOSIS — C259 Malignant neoplasm of pancreas, unspecified: Secondary | ICD-10-CM | POA: Diagnosis not present

## 2020-04-01 DIAGNOSIS — E876 Hypokalemia: Secondary | ICD-10-CM | POA: Diagnosis present

## 2020-04-01 DIAGNOSIS — I10 Essential (primary) hypertension: Secondary | ICD-10-CM | POA: Diagnosis not present

## 2020-04-01 DIAGNOSIS — T451X5A Adverse effect of antineoplastic and immunosuppressive drugs, initial encounter: Secondary | ICD-10-CM | POA: Diagnosis present

## 2020-04-01 DIAGNOSIS — C250 Malignant neoplasm of head of pancreas: Secondary | ICD-10-CM | POA: Diagnosis not present

## 2020-04-01 DIAGNOSIS — R112 Nausea with vomiting, unspecified: Secondary | ICD-10-CM | POA: Diagnosis present

## 2020-04-01 LAB — CBC WITH DIFFERENTIAL/PLATELET
Abs Immature Granulocytes: 0.03 10*3/uL (ref 0.00–0.07)
Basophils Absolute: 0 10*3/uL (ref 0.0–0.1)
Basophils Relative: 0 %
Eosinophils Absolute: 1.9 10*3/uL — ABNORMAL HIGH (ref 0.0–0.5)
Eosinophils Relative: 31 %
HCT: 32.2 % — ABNORMAL LOW (ref 36.0–46.0)
Hemoglobin: 10.7 g/dL — ABNORMAL LOW (ref 12.0–15.0)
Immature Granulocytes: 1 %
Lymphocytes Relative: 17 %
Lymphs Abs: 1.1 10*3/uL (ref 0.7–4.0)
MCH: 31 pg (ref 26.0–34.0)
MCHC: 33.2 g/dL (ref 30.0–36.0)
MCV: 93.3 fL (ref 80.0–100.0)
Monocytes Absolute: 0.3 10*3/uL (ref 0.1–1.0)
Monocytes Relative: 5 %
Neutro Abs: 2.9 10*3/uL (ref 1.7–7.7)
Neutrophils Relative %: 46 %
Platelets: 209 10*3/uL (ref 150–400)
RBC: 3.45 MIL/uL — ABNORMAL LOW (ref 3.87–5.11)
RDW: 13 % (ref 11.5–15.5)
WBC: 6.2 10*3/uL (ref 4.0–10.5)
nRBC: 0 % (ref 0.0–0.2)

## 2020-04-01 LAB — COMPREHENSIVE METABOLIC PANEL
ALT: 23 U/L (ref 0–44)
ALT: 22 U/L (ref 0–44)
AST: 29 U/L (ref 15–41)
AST: 26 U/L (ref 15–41)
Albumin: 3.4 g/dL — ABNORMAL LOW (ref 3.5–5.0)
Albumin: 3.1 g/dL — ABNORMAL LOW (ref 3.5–5.0)
Alkaline Phosphatase: 111 U/L (ref 38–126)
Alkaline Phosphatase: 100 U/L (ref 38–126)
Anion gap: 9 (ref 5–15)
Anion gap: 9 (ref 5–15)
BUN: 15 mg/dL (ref 8–23)
BUN: 12 mg/dL (ref 8–23)
CO2: 24 mmol/L (ref 22–32)
CO2: 23 mmol/L (ref 22–32)
Calcium: 8.8 mg/dL — ABNORMAL LOW (ref 8.9–10.3)
Calcium: 8.2 mg/dL — ABNORMAL LOW (ref 8.9–10.3)
Chloride: 104 mmol/L (ref 98–111)
Chloride: 105 mmol/L (ref 98–111)
Creatinine, Ser: 0.69 mg/dL (ref 0.44–1.00)
Creatinine, Ser: 0.65 mg/dL (ref 0.44–1.00)
GFR calc Af Amer: >60 mL/min (ref >60)
GFR calc Af Amer: >60 mL/min (ref >60)
GFR calc non Af Amer: >60 mL/min (ref >60)
GFR calc non Af Amer: >60 mL/min (ref >60)
Glucose, Bld: 99 mg/dL (ref 70–99)
Glucose, Bld: 97 mg/dL (ref 70–99)
Potassium: 3.4 mmol/L — ABNORMAL LOW (ref 3.5–5.1)
Potassium: 3.4 mmol/L — ABNORMAL LOW (ref 3.5–5.1)
Sodium: 137 mmol/L (ref 135–145)
Sodium: 137 mmol/L (ref 135–145)
Total Bilirubin: 1.4 mg/dL — ABNORMAL HIGH (ref 0.3–1.2)
Total Bilirubin: 0.7 mg/dL (ref 0.3–1.2)
Total Protein: 6.3 g/dL — ABNORMAL LOW (ref 6.5–8.1)
Total Protein: 5.7 g/dL — ABNORMAL LOW (ref 6.5–8.1)

## 2020-04-01 LAB — RESPIRATORY PANEL BY RT PCR (FLU A&B, COVID)
Influenza A by PCR: NEGATIVE
Influenza B by PCR: NEGATIVE
SARS Coronavirus 2 by RT PCR: NEGATIVE

## 2020-04-01 LAB — URINALYSIS, ROUTINE W REFLEX MICROSCOPIC
Bilirubin Urine: NEGATIVE
Glucose, UA: NEGATIVE mg/dL
Hgb urine dipstick: NEGATIVE
Ketones, ur: 80 mg/dL — AB
Leukocytes,Ua: NEGATIVE
Nitrite: NEGATIVE
Protein, ur: NEGATIVE mg/dL
Specific Gravity, Urine: 1.012 (ref 1.005–1.030)
pH: 9 — ABNORMAL HIGH (ref 5.0–8.0)

## 2020-04-01 LAB — TROPONIN I (HIGH SENSITIVITY)
Troponin I (High Sensitivity): 7 ng/L (ref <18)
Troponin I (High Sensitivity): 9 ng/L (ref <18)

## 2020-04-01 LAB — MAGNESIUM: Magnesium: 1.9 mg/dL (ref 1.7–2.4)

## 2020-04-01 LAB — CBC
HCT: 33.4 % — ABNORMAL LOW (ref 36.0–46.0)
Hemoglobin: 11.3 g/dL — ABNORMAL LOW (ref 12.0–15.0)
MCH: 31.2 pg (ref 26.0–34.0)
MCHC: 33.8 g/dL (ref 30.0–36.0)
MCV: 92.3 fL (ref 80.0–100.0)
Platelets: 225 10*3/uL (ref 150–400)
RBC: 3.62 MIL/uL — ABNORMAL LOW (ref 3.87–5.11)
RDW: 13 % (ref 11.5–15.5)
WBC: 6.1 10*3/uL (ref 4.0–10.5)
nRBC: 0 % (ref 0.0–0.2)

## 2020-04-01 LAB — LACTIC ACID, PLASMA: Lactic Acid, Venous: 1 mmol/L (ref 0.5–1.9)

## 2020-04-01 LAB — LIPASE, BLOOD: Lipase: 23 U/L (ref 11–51)

## 2020-04-01 LAB — HIV ANTIBODY (ROUTINE TESTING W REFLEX): HIV Screen 4th Generation wRfx: NONREACTIVE

## 2020-04-01 MED ORDER — MORPHINE SULFATE (PF) 4 MG/ML IV SOLN
4.0000 mg | INTRAVENOUS | Status: DC | PRN
  Administered 2020-04-01: 4 mg via INTRAVENOUS
  Filled 2020-04-01: qty 1

## 2020-04-01 MED ORDER — MORPHINE SULFATE ER 15 MG PO TBCR
15.0000 mg | EXTENDED_RELEASE_TABLET | Freq: Two times a day (BID) | ORAL | Status: DC
  Administered 2020-04-01: 15 mg via ORAL
  Filled 2020-04-01: qty 1

## 2020-04-01 MED ORDER — POLYETHYLENE GLYCOL 3350 17 G PO PACK
17.0000 g | PACK | Freq: Every day | ORAL | Status: DC | PRN
  Administered 2020-04-01: 17 g via ORAL
  Filled 2020-04-01: qty 1

## 2020-04-01 MED ORDER — POTASSIUM CHLORIDE 2 MEQ/ML IV SOLN
INTRAVENOUS | Status: DC
  Filled 2020-04-01 (×2): qty 1000

## 2020-04-01 MED ORDER — ACETAMINOPHEN 325 MG PO TABS: 650.0000 mg | ORAL_TABLET | Freq: Four times a day (QID) | ORAL | Status: DC | PRN

## 2020-04-01 MED ORDER — NITROGLYCERIN 0.4 MG SL SUBL: 0.4000 mg | SUBLINGUAL_TABLET | SUBLINGUAL | Status: DC | PRN

## 2020-04-01 MED ORDER — OLANZAPINE 10 MG PO TBDP: 10.0000 mg | ORAL_TABLET | ORAL | Status: DC

## 2020-04-01 MED ORDER — PROCHLORPERAZINE EDISYLATE 10 MG/2ML IJ SOLN: 10.0000 mg | Freq: Four times a day (QID) | INTRAMUSCULAR | Status: DC | PRN

## 2020-04-01 MED ORDER — LORAZEPAM 2 MG/ML IJ SOLN
1.0000 mg | Freq: Four times a day (QID) | INTRAMUSCULAR | Status: DC | PRN
  Administered 2020-04-01 (×2): 1 mg via INTRAVENOUS
  Filled 2020-04-01 (×2): qty 1

## 2020-04-01 MED ORDER — ROSUVASTATIN CALCIUM 10 MG PO TABS
10.0000 mg | ORAL_TABLET | Freq: Every day | ORAL | Status: DC
  Administered 2020-04-01: 10 mg via ORAL
  Filled 2020-04-01: qty 1

## 2020-04-01 MED ORDER — LISINOPRIL 5 MG PO TABS
5.0000 mg | ORAL_TABLET | Freq: Every day | ORAL | Status: DC
  Administered 2020-04-01: 5 mg via ORAL
  Filled 2020-04-01: qty 1

## 2020-04-01 MED ORDER — BUDESONIDE 3 MG PO CPEP
3.0000 mg | ORAL_CAPSULE | Freq: Every day | ORAL | Status: DC
  Filled 2020-04-01: qty 1

## 2020-04-01 MED ORDER — ACETAMINOPHEN 650 MG RE SUPP: 650.0000 mg | Freq: Four times a day (QID) | RECTAL | Status: DC | PRN

## 2020-04-01 MED ORDER — ESCITALOPRAM OXALATE 10 MG PO TABS
5.0000 mg | ORAL_TABLET | Freq: Two times a day (BID) | ORAL | Status: DC
  Administered 2020-04-01: 5 mg via ORAL
  Filled 2020-04-01: qty 1

## 2020-04-01 MED ORDER — LORAZEPAM 0.5 MG PO TABS: 0.5000 mg | ORAL_TABLET | Freq: Four times a day (QID) | ORAL | 0 refills | Status: AC | PRN

## 2020-04-01 MED ORDER — FENTANYL CITRATE (PF) 100 MCG/2ML IJ SOLN
50.0000 ug | Freq: Once | INTRAMUSCULAR | Status: AC
  Administered 2020-04-01: 50 ug via INTRAVENOUS
  Filled 2020-04-01: qty 2

## 2020-04-01 MED ORDER — ONDANSETRON HCL 4 MG/2ML IJ SOLN
4.0000 mg | Freq: Once | INTRAMUSCULAR | Status: AC
  Administered 2020-04-01: 4 mg via INTRAVENOUS
  Filled 2020-04-01: qty 2

## 2020-04-01 MED ORDER — OLANZAPINE 5 MG PO TBDP
5.0000 mg | ORAL_TABLET | Freq: Once | ORAL | Status: AC
  Administered 2020-04-01: 5 mg via ORAL
  Filled 2020-04-01: qty 1

## 2020-04-01 MED ORDER — DEXAMETHASONE SODIUM PHOSPHATE 10 MG/ML IJ SOLN
8.0000 mg | Freq: Two times a day (BID) | INTRAMUSCULAR | Status: DC
  Administered 2020-04-01: 8 mg via INTRAVENOUS
  Filled 2020-04-01: qty 1

## 2020-04-01 MED ORDER — SODIUM CHLORIDE 0.9 % IV SOLN
8.0000 mg | Freq: Three times a day (TID) | INTRAVENOUS | Status: DC | PRN
  Filled 2020-04-01: qty 4

## 2020-04-01 MED ORDER — BUDESONIDE 3 MG PO CPEP
3.0000 mg | ORAL_CAPSULE | Freq: Every day | ORAL | Status: DC
  Administered 2020-04-01: 3 mg via ORAL
  Filled 2020-04-01: qty 1

## 2020-04-01 MED ORDER — PROMETHAZINE HCL 25 MG/ML IJ SOLN: 12.5000 mg | Freq: Four times a day (QID) | INTRAMUSCULAR | Status: DC | PRN

## 2020-04-01 MED ORDER — SODIUM CHLORIDE 0.9 % IV BOLUS
1000.0000 mL | Freq: Once | INTRAVENOUS | Status: AC
  Administered 2020-04-01: 1000 mL via INTRAVENOUS

## 2020-04-01 MED ORDER — PANTOPRAZOLE SODIUM 40 MG IV SOLR
40.0000 mg | Freq: Two times a day (BID) | INTRAVENOUS | Status: DC
  Administered 2020-04-01: 40 mg via INTRAVENOUS
  Filled 2020-04-01: qty 40

## 2020-04-01 MED ORDER — ONDANSETRON HCL 4 MG/2ML IJ SOLN
4.0000 mg | Freq: Four times a day (QID) | INTRAMUSCULAR | Status: DC | PRN
  Administered 2020-04-01: 4 mg via INTRAVENOUS
  Filled 2020-04-01: qty 2

## 2020-04-01 MED ORDER — ENOXAPARIN SODIUM 40 MG/0.4ML ~~LOC~~ SOLN
40.0000 mg | Freq: Every day | SUBCUTANEOUS | Status: DC
  Filled 2020-04-01: qty 0.4

## 2020-04-01 MED ORDER — PROMETHAZINE HCL 25 MG/ML IJ SOLN
12.5000 mg | Freq: Once | INTRAMUSCULAR | Status: AC
  Administered 2020-04-01: 12.5 mg via INTRAVENOUS
  Filled 2020-04-01: qty 1

## 2020-04-01 MED ORDER — METOPROLOL TARTRATE 25 MG PO TABS
25.0000 mg | ORAL_TABLET | Freq: Two times a day (BID) | ORAL | Status: DC
  Administered 2020-04-01: 25 mg via ORAL
  Filled 2020-04-01: qty 1

## 2020-04-01 MED ORDER — DEXAMETHASONE 4 MG PO TABS: 4.0000 mg | ORAL_TABLET | Freq: Two times a day (BID) | ORAL | 0 refills | Status: AC

## 2020-04-01 MED ORDER — ASPIRIN EC 81 MG PO TBEC: 81.0000 mg | DELAYED_RELEASE_TABLET | Freq: Every day | ORAL | Status: DC

## 2020-04-01 MED ORDER — OLANZAPINE 5 MG PO TBDP: 5.0000 mg | ORAL_TABLET | Freq: Every day | ORAL | Status: DC

## 2020-04-01 MED ORDER — LACTATED RINGERS IV SOLN: INTRAVENOUS | Status: DC

## 2020-04-01 MED ORDER — OXYCODONE-ACETAMINOPHEN 5-325 MG PO TABS
2.0000 | ORAL_TABLET | ORAL | Status: DC | PRN
  Administered 2020-04-01 (×2): 2 via ORAL
  Filled 2020-04-01 (×2): qty 2

## 2020-04-01 MED ORDER — CLOPIDOGREL BISULFATE 75 MG PO TABS
75.0000 mg | ORAL_TABLET | Freq: Every day | ORAL | Status: DC
  Filled 2020-04-01: qty 1

## 2020-04-01 NOTE — ED Notes (Signed)
Pt transported to xray 

## 2020-04-01 NOTE — Discharge Summary (Signed)
Physician Discharge Summary  Vicki [name] [id number] DOB: 14-Mar-1956 DOA: 03/31/2020  PCP: Midge Minium, MD  Admit date: 03/31/2020 Discharge date: 04/01/2020  Admitted From: Home  Discharge disposition: Home   Recommendations for Outpatient Follow-Up:   . Follow up with your primary care provider in 1 to 2 weeks or as scheduled by the patient . Check CBC, BMP in the next visit   Discharge Diagnosis:   Active Problems:   Hyperlipidemia   Essential hypertension   Coronary artery disease involving native coronary artery of native heart without angina pectoris   Pancreatic cancer (HCC)   Chemotherapy induced nausea and vomiting   Hypokalemia due to excessive gastrointestinal loss of potassium    Discharge Condition: Improved.  Diet recommendation: Full liquids  Wound care: None.  Code status: Full.   History of Present Illness:    64 year old female with past medical history of recently diagnosed pancreatic cancer (dx 4/1 via EUS biopsy, S/P CBD metal stent due to stricture), hypertension, hyperlipidemia, depression, gastroesophageal reflux disease, coronary artery disease presented to the hospital after severe intractable nausea and vomiting. Patient received FOLFIRINOX, and within the next 24 hours began to experience intense nausea and frequent bouts of vomiting.  Due to  continued intractable symptoms the patient presented to [facility] emergency department for evaluation.    In the ED patient received intravenous Zofran, intravenous Phenergan and intravenous fluids.  Due to clinical concerns that the patient is dehydrated with ongoing symptoms of intractable nausea the hospitalist group has been called to assess the patient for admission the hospital.    Hospital Course:   Following conditions were addressed during hospitalization as listed below,  Metastatic pancreatic cancer.  Status post chemotherapy-induced nausea and vomiting at this time.   Patient follows up with Dr. Learta Codding oncology as outpatient.  Patient is supposed to follow-up with [facility] on 04/02/2020 as well.Status post cycle 1 FOLFIRINOX  Nausea and vomiting likely secondary to chemotherapy induced.  Patient will be given dexamethasone, Ativan as needed on discharge as per oncology recommendation.  Patient has tolerated full liquids at this time.  History of coronary artery disease, status post RCA stent.  No acute issues at this time.  Disposition.  At this time, patient is stable for disposition home  Medical Consultants:    Oncology  Procedures:   None Subjective:   Today, patient feels better, tolerated full liquids.  Seen by oncology and wishes to go home.  Discharge Exam:   Vitals:   04/01/20 1011 04/01/20 1500  BP:  (!) 146/70  Pulse: 68 65  Resp:  16  Temp:  98.3 F (36.8 C)  SpO2:  94%   Vitals:   04/01/20 0452 04/01/20 0958 04/01/20 1011 04/01/20 1500  BP: (!) 154/74 131/79  (!) 146/70  Pulse: 68 (!) 58 68 65  Resp: 18 16  16   Temp: 98.8 F (37.1 C) 98.7 F (37.1 C)  98.3 F (36.8 C)  TempSrc:  Oral  Oral  SpO2: 100% 95%  94%  Weight:      Height:        General: Alert awake, not in obvious distress HENT: pupils equally reacting to light,  No scleral pallor or icterus noted. Oral mucosa is moist.  Chest:  Clear breath sounds.  Diminished breath sounds bilaterally. No crackles or wheezes.  CVS: S1 &S2 heard. No murmur.  Regular rate and rhythm. Abdomen: Soft, nontender, nondistended.  Bowel sounds are heard.   Extremities:  No cyanosis, clubbing or edema.  Peripheral pulses are palpable. Psych: Alert, awake and oriented, normal mood CNS:  No cranial nerve deficits.  Power equal in all extremities.   Skin: Warm and dry.  No rashes noted.  The results of significant diagnostics from this hospitalization (including imaging, microbiology, ancillary and laboratory) are listed below for reference.     Diagnostic Studies:     DG Abd 2 Views  Result Date: 04/01/2020 CLINICAL DATA:  Bowel obstruction EXAM: ABDOMEN - 2 VIEW COMPARISON:  Abdominal CT 03/19/2020 FINDINGS: Formed stool throughout the distal colon. No evidence of small bowel obstruction. No evidence of pneumoperitoneum. Metallic CBD stent. Clear lung bases. IMPRESSION: 1. Nonobstructive bowel gas pattern. 2. Stool throughout the distal colon. Electronically Signed   By: Monte Fantasia M.D.   On: 04/01/2020 05:35     Labs:   Basic Metabolic Panel: Recent Labs  Lab 03/28/20 1045 03/28/20 1045 03/31/20 2343 04/01/20 0619  NA 139  --  137 137  K 4.5   < > 3.4* 3.4*  CL 108  --  104 105  CO2 23  --  24 23  GLUCOSE 113*  --  99 97  BUN 10  --  15 12  CREATININE 0.68  --  0.69 0.65  CALCIUM 8.7*  --  8.8* 8.2*  MG  --   --   --  1.9   < > = values in this interval not displayed.   GFR Estimated Creatinine Clearance: 65.6 mL/min (by C-G formula based on SCr of 0.65 mg/dL). Liver Function Tests: Recent Labs  Lab 03/28/20 1045 03/31/20 2343 04/01/20 0619  AST 18 29 26   ALT 15 23 22   ALKPHOS 152* 111 100  BILITOT 0.7 1.4* 0.7  PROT 6.2* 6.3* 5.7*  ALBUMIN 3.1* 3.4* 3.1*   Recent Labs  Lab 03/31/20 2343  LIPASE 23   No results for input(s): AMMONIA in the last 168 hours. Coagulation profile No results for input(s): INR, PROTIME in the last 168 hours.  CBC: Recent Labs  Lab 03/31/20 2343 04/01/20 0619  WBC 6.1 6.2  NEUTROABS  --  2.9  HGB 11.3* 10.7*  HCT 33.4* 32.2*  MCV 92.3 93.3  PLT 225 209   Cardiac Enzymes: No results for input(s): CKTOTAL, CKMB, CKMBINDEX, TROPONINI in the last 168 hours. BNP: Invalid input(s): POCBNP CBG: No results for input(s): GLUCAP in the last 168 hours. D-Dimer No results for input(s): DDIMER in the last 72 hours. Hgb A1c No results for input(s): HGBA1C in the last 72 hours. Lipid Profile No results for input(s): CHOL, HDL, LDLCALC, TRIG, CHOLHDL, LDLDIRECT in the last 72  hours. Thyroid function studies No results for input(s): TSH, T4TOTAL, T3FREE, THYROIDAB in the last 72 hours.  Invalid input(s): FREET3 Anemia work up No results for input(s): VITAMINB12, FOLATE, FERRITIN, TIBC, IRON, RETICCTPCT in the last 72 hours. Microbiology Recent Results (from the past 240 hour(s))  Respiratory Panel by RT PCR (Flu A&B, Covid) - Nasopharyngeal Swab     Status: None   Collection Time: 04/01/20  3:16 AM   Specimen: Nasopharyngeal Swab  Result Value Ref Range Status   SARS Coronavirus 2 by RT PCR NEGATIVE NEGATIVE Final    Comment: (NOTE) SARS-CoV-2 target nucleic acids are NOT DETECTED. The SARS-CoV-2 RNA is generally detectable in upper respiratoy specimens during the acute phase of infection. The lowest concentration of SARS-CoV-2 viral copies this assay can detect is 131 copies/mL. A negative result does not preclude SARS-Cov-2 infection  and should not be used as the sole basis for treatment or other patient management decisions. A negative result may occur with  improper specimen collection/handling, submission of specimen other than nasopharyngeal swab, presence of viral mutation(s) within the areas targeted by this assay, and inadequate number of viral copies (<131 copies/mL). A negative result must be combined with clinical observations, patient history, and epidemiological information. The expected result is Negative. Fact Sheet for Patients:  PinkCheek.be Fact Sheet for Healthcare Providers:  GravelBags.it This test is not yet ap proved or cleared by the [place] FDA and  has been authorized for detection and/or diagnosis of SARS-CoV-2 by FDA under an Emergency Use Authorization (EUA). This EUA will remain  in effect (meaning this test can be used) for the duration of the COVID-19 declaration under Section 564(b)(1) of the Act, 21 U.S.C. section 360bbb-3(b)(1), unless the authorization  is terminated or revoked sooner.    Influenza A by PCR NEGATIVE NEGATIVE Final   Influenza B by PCR NEGATIVE NEGATIVE Final    Comment: (NOTE) The Xpert Xpress SARS-CoV-2/FLU/RSV assay is intended as an aid in  the diagnosis of influenza from Nasopharyngeal swab specimens and  should not be used as a sole basis for treatment. Nasal washings and  aspirates are unacceptable for Xpert Xpress SARS-CoV-2/FLU/RSV  testing. Fact Sheet for Patients: PinkCheek.be Fact Sheet for Healthcare Providers: GravelBags.it This test is not yet approved or cleared by the [place] FDA and  has been authorized for detection and/or diagnosis of SARS-CoV-2 by  FDA under an Emergency Use Authorization (EUA). This EUA will remain  in effect (meaning this test can be used) for the duration of the  Covid-19 declaration under Section 564(b)(1) of the Act, 21  U.S.C. section 360bbb-3(b)(1), unless the authorization is  terminated or revoked. Performed at [facility], [place] [address] [place], [place] [address]      Discharge Instructions:   Discharge Instructions    Diet - low sodium heart healthy   Complete by: As directed    Discharge instructions   Complete by: As directed    Continue to take nausea medications as prescribed. Follow up with Dr Benay Spice oncology as scheduled by the clinic. Follow up with your primary care provider as scheduled by you.   Increase activity slowly   Complete by: As directed      Allergies as of 04/01/2020      Reactions   Chlorhexidine    Do not add, port not accessed      Medication List    STOP taking these medications   ibuprofen 200 MG tablet Commonly known as: ADVIL     TAKE these medications   ALPRAZolam 0.25 MG tablet Commonly known as: XANAX Take 1 tablet (0.25 mg total) by mouth at bedtime as needed. What changed: when to take this   aspirin 81 MG tablet Take 81 mg by  mouth at bedtime.   budesonide 3 MG 24 hr capsule Commonly known as: ENTOCORT EC Take 3 mg by mouth daily.   cetirizine 10 MG tablet Commonly known as: ZYRTEC Take 10 mg by mouth at bedtime.   dexamethasone 4 MG tablet Commonly known as: DECADRON Take 1 tablet (4 mg total) by mouth 2 (two) times daily for 2 days.   escitalopram 10 MG tablet Commonly known as: LEXAPRO Take 1 tablet (10 mg total) by mouth daily. What changed:   how much to take  when to take this   lidocaine-prilocaine cream Commonly  known as: EMLA Apply 1 application topically as directed. Apply to port site 1 hour prior to stick and cover with plastic wrap   lisinopril 5 MG tablet Commonly known as: ZESTRIL Take 1 tablet (5 mg total) by mouth daily.   LORazepam 0.5 MG tablet Commonly known as: Ativan Take 1 tablet (0.5 mg total) by mouth every 6 (six) hours as needed for up to 3 days (nausea).   metoprolol tartrate 50 MG tablet Commonly known as: LOPRESSOR TAKE 1 TABLET(50 MG) BY MOUTH TWICE DAILY What changed: See the new instructions.   montelukast 10 MG tablet Commonly known as: SINGULAIR TAKE 1 TABLET(10 MG) BY MOUTH AT BEDTIME What changed:   how much to take  how to take this  when to take this  additional instructions   morphine 15 MG 12 hr tablet Commonly known as: MS CONTIN Take 1 tablet (15 mg total) by mouth every 12 (twelve) hours.   nitroGLYCERIN 0.4 MG SL tablet Commonly known as: NITROSTAT PLACE 1 TABLET UNDER THE TONGUE EVERY 5 MINUTES AS NEEDED FOR CHEST PAIN   Omega-3 1000 MG Caps Take 1,000 mg by mouth at bedtime.   ondansetron 8 MG tablet Commonly known as: ZOFRAN Take 1 tablet (8 mg total) by mouth every 8 (eight) hours as needed for nausea or vomiting.   oxyCODONE-acetaminophen 10-325 MG tablet Commonly known as: Percocet Take 1 tablet by mouth every 4 (four) hours as needed for pain. What changed: when to take this   pantoprazole 40 MG tablet Commonly  known as: PROTONIX Take 1 tablet (40 mg total) by mouth 2 (two) times daily.   Plavix 75 MG tablet Generic drug: clopidogrel Take 75 mg by mouth daily.   polyethylene glycol 17 g packet Commonly known as: MIRALAX / GLYCOLAX Take 17 g by mouth daily as needed for mild constipation or moderate constipation.   potassium chloride SA 20 MEQ tablet Commonly known as: KLOR-CON TAKE 1 TABLET(20 MEQ) BY MOUTH DAILY What changed: See the new instructions.   prochlorperazine 10 MG tablet Commonly known as: COMPAZINE Take 1 tablet (10 mg total) by mouth every 6 (six) hours as needed for nausea.   rosuvastatin 10 MG tablet Commonly known as: CRESTOR TAKE 1 TABLET(10 MG) BY MOUTH DAILY What changed: See the new instructions.   Vitamin D3 125 MCG (5000 UT) Caps Take 5,000 Units by mouth at bedtime.         Time coordinating discharge: 39 minutes  Signed:  Debroah Shuttleworth  [facility] 04/01/2020, 5:51 PM

## 2020-04-01 NOTE — H&P (Signed)
History and Physical    Vicki [name] [id number] DOB: June 27, 1956 DOA: 03/31/2020  PCP: Midge Minium, MD  Patient coming from: Home   Chief Complaint:  Chief Complaint  Patient presents with  . Emesis     HPI:  64 year old female with past medical history of recently diagnosed pancreatic cancer (dx 4/1 via EUS biopsy, S/P CBD metal stent due to stricture), hypertension, hyperlipidemia, depression, gastroesophageal reflux disease, coronary artery disease who presents after experiencing severe intractable nausea and vomiting.  Patient explains that on 4/29 she started her first dose of chemotherapy for her recently diagnosed pancreatic cancer.  Patient received FOLFIRINOX, and within the next 24 hours began to experience intense nausea and frequent bouts of vomiting.  Patient describes the vomitus as nonbloody and nonbilious.  Patient states that because of near constant nausea and frequent bouts of vomiting she has not been able to tolerate any oral intake.  As the patient symptoms continue to persist over the next several days patient began to develop generalized weakness and lightheadedness.  Patient denies dysuria, cough, shortness of breath, fever, sick contacts, recent travel or confirmed contact with COVID-19.  Is of continued intractable symptoms the patient presented to [facility] emergency department for evaluation.  Upon evaluation in the emergency department patient was provided with intravenous Zofran, intravenous Phenergan and intravenous fluids.  Due to clinical concerns that the patient is dehydrated with ongoing symptoms of intractable nausea the hospitalist group has been called to assess the patient for admission the hospital.     Review of Systems: A 10-system review of systems has been performed and all systems are negative with the exception of what is listed in the HPI.  Past Medical History:  Diagnosis Date  . CAD (coronary artery disease) 2011   with stent to RCA and 60 % LAD disease  . Clotting disorder ([place])   . Family history of breast cancer   . Family history of cystic fibrosis   . H/O cardiac arrest 02/15/10   with STEMI- Inf wall  . Hyperlipidemia   . Hypertension   . Myocardial infarct ([place]) 02/15/10  . pancreatic ca dx'd 02/2020  . Shock, cardiogenic ([place]) 01/2010   with MI, IABP    Past Surgical History:  Procedure Laterality Date  . ABDOMINAL HYSTERECTOMY  1987   BSO  . APPENDECTOMY  1987  . BILIARY STENT PLACEMENT N/A 03/05/2020   Procedure: BILIARY STENT PLACEMENT;  Surgeon: Clarene Essex, MD;  Location: [facility];  Service: Endoscopy;  Laterality: N/A;  . CORONARY ANGIOPLASTY WITH STENT PLACEMENT  02/15/10   Stent to prox RCA -BMS  . DOPPLER ECHOCARDIOGRAPHY  05/06/2010   EF =50-55%  lvfx low normal ;no sigificant valvular disease seen  . ENDOSCOPIC RETROGRADE CHOLANGIOPANCREATOGRAPHY (ERCP) WITH PROPOFOL N/A 02/29/2020   Procedure: ENDOSCOPIC RETROGRADE CHOLANGIOPANCREATOGRAPHY (ERCP) WITH PROPOFOL;  Surgeon: Arta Silence, MD;  Location: [facility];  Service: Endoscopy;  Laterality: N/A;  . ERCP N/A 03/05/2020   Procedure: ENDOSCOPIC RETROGRADE CHOLANGIOPANCREATOGRAPHY (ERCP);  Surgeon: Clarene Essex, MD;  Location: [facility];  Service: Endoscopy;  Laterality: N/A;  with stent placement  . ESOPHAGOGASTRODUODENOSCOPY (EGD) WITH PROPOFOL N/A 02/29/2020   Procedure: ESOPHAGOGASTRODUODENOSCOPY (EGD) WITH PROPOFOL;  Surgeon: Arta Silence, MD;  Location: [facility];  Service: Endoscopy;  Laterality: N/A;  . EXPLORATORY LAPAROTOMY    . FINE NEEDLE ASPIRATION  02/29/2020   Procedure: FINE NEEDLE ASPIRATION;  Surgeon: Arta Silence, MD;  Location: [facility];  Service: Endoscopy;;  . HERNIA REPAIR  1980   double bilateral  . IR IMAGING GUIDED PORT INSERTION  03/26/2020  . MASS EXCISION  09/02/2012   Procedure: EXCISION MASS;  Surgeon: Earnstine Regal, MD;  Location: [place];  Service: General;   Laterality: Left;  Excise mass Left posterior neck  . NM MYOCAR PERF WALL MOTION  05/06/2010   exercise cap 8 mets. ,mild perfusion defect in basal infer.,mid infer., apical infer., region consistent with infarct/scar. no significant ischemia  . PANCREATIC STENT PLACEMENT  02/29/2020   Procedure: PANCREATIC STENT PLACEMENT;  Surgeon: Arta Silence, MD;  Location: [facility];  Service: Endoscopy;;  . right chest wall exploration    . SPHINCTEROTOMY  02/29/2020   Procedure: SPHINCTEROTOMY;  Surgeon: Arta Silence, MD;  Location: [facility];  Service: Endoscopy;;  . [name]  03/05/2020   Procedure: [name];  Surgeon: Clarene Essex, MD;  Location: [facility];  Service: Endoscopy;;  . UPPER ESOPHAGEAL ENDOSCOPIC ULTRASOUND (EUS) N/A 02/29/2020   Procedure: UPPER ESOPHAGEAL ENDOSCOPIC ULTRASOUND (EUS)  LINEAR SCOPE;  Surgeon: Arta Silence, MD;  Location: [facility];  Service: Endoscopy;  Laterality: N/A;  DR. NEEDS TWO HOURS FOR PROCEDURE     reports that she quit smoking about 10 years ago. She has never used smokeless tobacco. She reports current alcohol use of about 1.0 - 2.0 standard drinks of alcohol per week. She reports that she does not use drugs.  No Known Allergies  Family History  Problem Relation Age of Onset  . Breast cancer Mother 39  . Dementia Mother   . Cystic fibrosis Father 45       late onset dx  . Healthy Brother   . Dementia Maternal Aunt   . Dementia Maternal Uncle   . Other Paternal Uncle        MVA while in the [employer]  . Arthritis Maternal Grandmother   . AAA (abdominal aortic aneurysm) Maternal Grandfather   . Other Paternal 104        old age  . Heart attack Paternal Grandfather   . Liver disease Paternal Uncle   . Pancreatitis Son        several bouts of pancreatitis  . Breast cancer Cousin 17       mat first cousin  . Colon cancer Neg Hx      Prior to Admission medications   Medication Sig Start Date End Date Taking?  Authorizing Provider  ALPRAZolam (XANAX) 0.25 MG tablet Take 1 tablet (0.25 mg total) by mouth at bedtime as needed. Patient taking differently: Take 0.25 mg by mouth at bedtime.  11/02/19  Yes Midge Minium, MD  aspirin 81 MG tablet Take 81 mg by mouth at bedtime.    Yes [provider]  budesonide (ENTOCORT EC) 3 MG 24 hr capsule Take 3 mg by mouth daily.  01/26/18  Yes [provider]  cetirizine (ZYRTEC) 10 MG tablet Take 10 mg by mouth at bedtime.    Yes [provider]  Cholecalciferol (VITAMIN D3) 5000 units CAPS Take 5,000 Units by mouth at bedtime.    Yes [provider]  clopidogrel (PLAVIX) 75 MG tablet Take 75 mg by mouth daily.   Yes [provider]  escitalopram (LEXAPRO) 10 MG tablet Take 1 tablet (10 mg total) by mouth daily. Patient taking differently: Take 5 mg by mouth in the morning and at bedtime.  01/04/20  Yes Midge Minium, MD  ibuprofen (ADVIL) 200 MG tablet Take 400 mg by mouth every 5 (  five) hours as needed for moderate pain (take w/Percocet).    Yes [provider]  lidocaine-prilocaine (EMLA) cream Apply 1 application topically as directed. Apply to port site 1 hour prior to stick and cover with plastic wrap 03/25/20  Yes Ladell Pier, MD  lisinopril (ZESTRIL) 5 MG tablet Take 1 tablet (5 mg total) by mouth daily. 11/29/19  Yes Kroeger, Daleen Snook M., PA-C  metoprolol tartrate (LOPRESSOR) 50 MG tablet TAKE 1 TABLET(50 MG) BY MOUTH TWICE DAILY Patient taking differently: Take 25 mg by mouth 2 (two) times daily.  02/20/19  Yes Lorretta Harp, MD  montelukast (SINGULAIR) 10 MG tablet TAKE 1 TABLET(10 MG) BY MOUTH AT BEDTIME Patient taking differently: Take 10 mg by mouth at bedtime.  11/02/19  Yes Midge Minium, MD  morphine (MS CONTIN) 15 MG 12 hr tablet Take 1 tablet (15 mg total) by mouth every 12 (twelve) hours. 03/20/20  Yes Ladell Pier, MD  nitroGLYCERIN (NITROSTAT) 0.4 MG SL tablet PLACE 1  TABLET UNDER THE TONGUE EVERY 5 MINUTES AS NEEDED FOR CHEST PAIN 11/29/19  Yes Lorretta Harp, MD  Omega-3 1000 MG CAPS Take 1,000 mg by mouth at bedtime.   Yes [provider]  ondansetron (ZOFRAN) 8 MG tablet Take 1 tablet (8 mg total) by mouth every 8 (eight) hours as needed for nausea or vomiting. 03/12/20  Yes Ladell Pier, MD  oxyCODONE-acetaminophen (PERCOCET) 10-325 MG tablet Take 1 tablet by mouth every 4 (four) hours as needed for pain. Patient taking differently: Take 1 tablet by mouth every 5 (five) hours as needed for pain.  03/12/20  Yes Ladell Pier, MD  pantoprazole (PROTONIX) 40 MG tablet Take 1 tablet (40 mg total) by mouth 2 (two) times daily. 11/30/19  Yes Lorretta Harp, MD  polyethylene glycol (MIRALAX / GLYCOLAX) 17 g packet Take 17 g by mouth daily as needed for mild constipation or moderate constipation.    Yes [provider]  potassium chloride SA (KLOR-CON) 20 MEQ tablet TAKE 1 TABLET(20 MEQ) BY MOUTH DAILY Patient taking differently: Take 20 mEq by mouth daily.  03/15/20  Yes Lorretta Harp, MD  prochlorperazine (COMPAZINE) 10 MG tablet Take 1 tablet (10 mg total) by mouth every 6 (six) hours as needed for nausea. 03/25/20  Yes Ladell Pier, MD  rosuvastatin (CRESTOR) 10 MG tablet TAKE 1 TABLET(10 MG) BY MOUTH DAILY Patient taking differently: Take 10 mg by mouth daily.  01/03/20  Yes Lorretta Harp, MD    Physical Exam: Vitals:   03/31/20 2335 04/01/20 0100 04/01/20 0200 04/01/20 0230  BP: 124/72 (!) 143/74 133/70 (!) 107/58  Pulse: 66 (!) 55 71 66  Resp: 16 11 11 20   Temp: 98.7 F (37.1 C)     TempSrc: Oral     SpO2: 98% 99% 99% 99%  Weight: 65.8 kg     Height: 5\' 6"  (1.676 m)        Constitutional: Acute alert and oriented x3, patient is in distress due to intense nausea.   Skin: no rashes, no lesions, poor skin turgor noted. Eyes: Pupils are equally reactive to light.  No evidence of scleral icterus or conjunctival  pallor.  ENMT: Dry mucous membranes noted.  Posterior pharynx clear of any exudate or lesions.   Neck: normal, supple, no masses, no thyromegaly.  No evidence of jugular venous distension.   Respiratory: clear to auscultation bilaterally, no wheezing, no crackles. Normal respiratory effort. No accessory muscle use.  Cardiovascular: Regular rate and rhythm, no murmurs / rubs / gallops. No extremity edema. 2+ pedal pulses. No carotid bruits.  Chest:   Nontender without crepitus or deformity.   Back:   Nontender without crepitus or deformity. Abdomen: Generalized abdominal tenderness.  Abdomen is soft.  No evidence of intra-abdominal masses.  Positive bowel sounds noted in all quadrants.   Musculoskeletal: No joint deformity upper and lower extremities. Good ROM, no contractures. Normal muscle tone.  Neurologic: CN 2-12 grossly intact. Sensation intact, strength noted to be 5 out of 5 in all 4 extremities.  Patient is following all commands.  Patient is responsive to verbal stimuli.   Psychiatric: Patient exhibits a depressed mood with somewhat flat affect.  Patient seems to possess insight as to theircurrent situation.     Labs on Admission: I have personally reviewed following labs and imaging studies -   CBC: Recent Labs  Lab 03/31/20 2343  WBC 6.1  HGB 11.3*  HCT 33.4*  MCV 92.3  PLT 123456   Basic Metabolic Panel: Recent Labs  Lab 03/28/20 1045 03/31/20 2343  NA 139 137  K 4.5 3.4*  CL 108 104  CO2 23 24  GLUCOSE 113* 99  BUN 10 15  CREATININE 0.68 0.69  CALCIUM 8.7* 8.8*   GFR: Estimated Creatinine Clearance: 65.6 mL/min (by C-G formula based on SCr of 0.69 mg/dL). Liver Function Tests: Recent Labs  Lab 03/28/20 1045 03/31/20 2343  AST 18 29  ALT 15 23  ALKPHOS 152* 111  BILITOT 0.7 1.4*  PROT 6.2* 6.3*  ALBUMIN 3.1* 3.4*   Recent Labs  Lab 03/31/20 2343  LIPASE 23   No results for input(s): AMMONIA in the last 168 hours. Coagulation Profile: No results for  input(s): INR, PROTIME in the last 168 hours. Cardiac Enzymes: No results for input(s): CKTOTAL, CKMB, CKMBINDEX, TROPONINI in the last 168 hours. BNP (last 3 results) No results for input(s): PROBNP in the last 8760 hours. HbA1C: No results for input(s): HGBA1C in the last 72 hours. CBG: No results for input(s): GLUCAP in the last 168 hours. Lipid Profile: No results for input(s): CHOL, HDL, LDLCALC, TRIG, CHOLHDL, LDLDIRECT in the last 72 hours. Thyroid Function Tests: No results for input(s): TSH, T4TOTAL, FREET4, T3FREE, THYROIDAB in the last 72 hours. Anemia Panel: No results for input(s): VITAMINB12, FOLATE, FERRITIN, TIBC, IRON, RETICCTPCT in the last 72 hours. Urine analysis:    Component Value Date/Time   COLORURINE YELLOW 03/31/2020 2343   APPEARANCEUR CLEAR 03/31/2020 2343   LABSPEC 1.012 03/31/2020 2343   PHURINE 9.0 (H) 03/31/2020 2343   GLUCOSEU NEGATIVE 03/31/2020 2343   HGBUR NEGATIVE 03/31/2020 2343   BILIRUBINUR NEGATIVE 03/31/2020 2343   BILIRUBINUR negative 02/12/2015 1145   KETONESUR 80 (A) 03/31/2020 2343   PROTEINUR NEGATIVE 03/31/2020 2343   UROBILINOGEN 0.2 02/12/2015 1145   UROBILINOGEN 4.0 (H) 02/20/2010 1212   NITRITE NEGATIVE 03/31/2020 2343   LEUKOCYTESUR NEGATIVE 03/31/2020 2343    Radiological Exams on Admission - Personally Reviewed: No results found.  EKG: Personally reviewed.  Rhythm is normal sinus rhythm at 60 bpm.  No dynamic ST segment changes appreciated.  Assessment/Plan Active Problems:   Chemotherapy induced nausea and vomiting   Patient presenting with intense intractable chemotherapy-induced nausea and vomiting after receiving FOLFIRINOX recently diagnosed adenocarcinoma of the pancreas  Patient is only experiencing minimal relief with Zofran alone.  Will ensure patient is receiving adequate doses of Zofran at 8 mg IV every 8 hours as needed for nausea.  We will additionally place patient on as needed intravenous lorazepam  for breakthrough nausea.    We will also place patient on a short course of olanzapine/Zydis 5 mg sublingual today followed by 5 mg daily for a total of a 4-day course.    Patient is been placed on clear liquid diet   Patient with intravenous isotonic fluids   Monitoring patient on telemetry considering multiple QT prolonging agents  Will consider consultation of oncology if this regimen is not successful  Urinalysis reveals no evidence of urinary tract infection.  Abdominal x-ray being obtained to rule out bowel obstruction.    Essential hypertension   Continue home regimen of oral antihypertensives as tolerated  Coronary artery disease of native artery of native heart without angina pectoris   Continue home regimen of antiplatelet therapy  Continue home regimen of statin therapy  New home regimen of beta-blocker therapy  Patient is currently chest pain-free  Pancreatic cancer [facility])   Patient recently diagnosed with adenocarcinoma of the pancreas on 4/1 via EUS biopsy  Patient received first dose of chemotherapy on 4/28 with FOLFIRINOX  Follows with oncologist Dr. Benay Spice    Hypokalemia due to excessive gastrointestinal loss of potassium   Replacing with intravenous supplementation  Monitoring potassium levels with serial chemistries    Hyperlipidemia  Continue home regimen of statin therapy as tolerated    Code Status:  Full code Family Communication: Husband has been updated and is at the bedside  Status is: Observation  The patient remains OBS appropriate and will d/c before 2 midnights.  Dispo: The patient is from: Home              Anticipated d/c is to: Home              Anticipated d/c date is: 2 days              Patient currently is not medically stable to d/c.        Vernelle Emerald MD [facility] Pager [phone]  If 7PM-7AM, please contact night-coverage [email or web address] Use universal [place] password for that web  site. If you do not have the password, please call the hospital operator.  04/01/2020, 3:31 AM

## 2020-04-01 NOTE — Progress Notes (Signed)
Pt d/c home with husband. No changes in initial am assessment at time of d/c. Pt without pain and nausea. She ate 25% of a full liquid diet. She tolerated well. Gave her Zofran and ativan to. Condition stable at time of d/c. Verbalized understanding of D/C instruction given

## 2020-04-01 NOTE — Progress Notes (Addendum)
HEMATOLOGY-ONCOLOGY PROGRESS NOTE  SUBJECTIVE: Still having nausea, but not vomiting.  Tolerating sips of clear liquids well.  Oncology History  Pancreatic cancer ([place])  03/20/2020 Initial Diagnosis   Pancreatic cancer ([place])   03/28/2020 -  Chemotherapy   The patient had palonosetron (ALOXI) injection 0.25 mg, 0.25 mg, Intravenous,  Once, 1 of 4 cycles Administration: 0.25 mg (03/28/2020) irinotecan (CAMPTOSAR) 280 mg in sodium chloride 0.9 % 500 mL chemo infusion, 150 mg/m2 = 280 mg, Intravenous,  Once, 1 of 4 cycles Administration: 280 mg (03/28/2020) oxaliplatin (ELOXATIN) 150 mg in dextrose 5 % 500 mL chemo infusion, 84 mg/m2 = 155 mg, Intravenous,  Once, 1 of 4 cycles Administration: 150 mg (03/28/2020) fosaprepitant (EMEND) 150 mg in sodium chloride 0.9 % 145 mL IVPB, 150 mg, Intravenous,  Once, 1 of 4 cycles Administration: 150 mg (03/28/2020) fluorouracil (ADRUCIL) 3,600 mg in sodium chloride 0.9 % 78 mL chemo infusion, 2,000 mg/m2 = 3,600 mg (100 % of original dose 2,000 mg/m2), Intravenous, 1 Day/Dose, 1 of 4 cycles Dose modification: 2,000 mg/m2 (original dose 2,000 mg/m2, Cycle 1, Reason: Provider Judgment) Administration: 3,600 mg (03/28/2020) leucovorin 720 mg in sodium chloride 0.9 % 250 mL infusion, 400 mg/m2 = 720 mg, Intravenous,  Once, 1 of 4 cycles Administration: 720 mg (03/28/2020)  for chemotherapy treatment.     PHYSICAL EXAMINATION:  Vitals:   04/01/20 0958 04/01/20 1011  BP: 131/79   Pulse: (!) 58 68  Resp: 16   Temp: 98.7 F (37.1 C)   SpO2: 95%    Filed Weights   03/31/20 2335  Weight: 65.8 kg    Intake/Output from previous day: 05/02 0701 - 05/03 0700 In: 1003 [I.V.:3; IV Piggyback:1000] Out: -   GENERAL:alert, no distress and comfortable LUNGS: clear to auscultation and percussion with normal breathing effort HEART: regular rate & rhythm and no murmurs and no lower extremity edema ABDOMEN:abdomen soft, non-tender and normal bowel sounds NEURO:  alert & oriented x 3 with fluent speech, no focal motor/sensory deficits  LABORATORY DATA:  I have reviewed the data as listed CMP Latest Ref Rng & Units 04/01/2020 03/31/2020 03/28/2020  Glucose 70 - 99 mg/dL 97 99 113(H)  BUN 8 - 23 mg/dL 12 15 10   Creatinine 0.44 - 1.00 mg/dL 0.65 0.69 0.68  Sodium 135 - 145 mmol/L 137 137 139  Potassium 3.5 - 5.1 mmol/L 3.4(L) 3.4(L) 4.5  Chloride 98 - 111 mmol/L 105 104 108  CO2 22 - 32 mmol/L 23 24 23   Calcium 8.9 - 10.3 mg/dL 8.2(L) 8.8(L) 8.7(L)  Total Protein 6.5 - 8.1 g/dL 5.7(L) 6.3(L) 6.2(L)  Total Bilirubin 0.3 - 1.2 mg/dL 0.7 1.4(H) 0.7  Alkaline Phos 38 - 126 U/L 100 111 152(H)  AST 15 - 41 U/L 26 29 18   ALT 0 - 44 U/L 22 23 15     Lab Results  Component Value Date   WBC 6.2 04/01/2020   HGB 10.7 (L) 04/01/2020   HCT 32.2 (L) 04/01/2020   MCV 93.3 04/01/2020   PLT 209 04/01/2020   NEUTROABS 2.9 04/01/2020    CT CHEST W CONTRAST  Result Date: 03/19/2020 CLINICAL DATA:  64 year old female with history of pancreatic cancer. 20 pound weight loss since January 2021. Mid abdominal pain radiating to the back since March 2020. EXAM: CT CHEST, ABDOMEN, AND PELVIS WITH CONTRAST TECHNIQUE: Multidetector CT imaging of the chest, abdomen and pelvis was performed following the standard protocol during bolus administration of intravenous contrast. CONTRAST:  114mL OMNIPAQUE IOHEXOL 300  MG/ML  SOLN COMPARISON:  Abdominal MRI 02/21/2020. FINDINGS: CT CHEST FINDINGS Cardiovascular: Heart size is borderline enlarged. There is no significant pericardial fluid, thickening or pericardial calcification. There is aortic atherosclerosis, as well as atherosclerosis of the great vessels of the mediastinum and the coronary arteries, including calcified atherosclerotic plaque in the left main, left anterior descending, left circumflex and right coronary arteries. Mediastinum/Nodes: No pathologically enlarged mediastinal or hilar lymph nodes. Esophagus is unremarkable in  appearance. No axillary lymphadenopathy. Lungs/Pleura: 4 mm pulmonary nodule in the superior segment of the left lower lobe (axial image 46 of series 12), nonspecific. No other larger more suspicious appearing pulmonary nodules or masses are noted. No acute consolidative airspace disease. No pleural effusions. Linear areas of scarring or subsegmental atelectasis in the right middle lobe and inferior segment of the lingula incidentally noted. Musculoskeletal: Chronic appearing compression fracture with 25% loss of anterior vertebral body height. Old healed fracture of the mid sternum. There are no aggressive appearing lytic or blastic lesions noted in the visualized portions of the skeleton. CT ABDOMEN PELVIS FINDINGS Hepatobiliary: No discrete cystic or solid hepatic lesions are confidently identified on today's examination. Gallbladder is unremarkable in appearance. Small amount of pneumobilia related to prior sphincterotomy. Common bile duct stent noted in the common bile duct. Pancreas: Previously noted hypovascular mass in the head and uncinate process of the pancreas is again noted (best appreciated on axial image 101 of series 7 and coronal image 49 of series 8) where this measures approximately 2.9 x 2.1 x 3.1 cm. This lesion comes in close proximity to the superior mesenteric artery with apparent contact with the proximal superior mesenteric artery from approximately 10:00 to 6:00 (axial image 107 of series 7). The anterior aspect of the lesion also comes in close proximity to the splenoportal confluence, without definite contact or distortion of the splenoportal confluence or the proximal portal vein. The posterior and inferior aspect of the lesion comes in direct contact with the anterior surface of the abdominal aorta immediately below the level of the renal arteries. Dilatation of the main pancreatic duct which measures up to 8 mm in the body and head of the pancreas. Mild atrophy throughout the distal  body and tail of the pancreas. Spleen: Unremarkable. Adrenals/Urinary Tract: Bilateral kidneys and adrenal glands are normal in appearance. No hydroureteronephrosis. Urinary bladder is normal in appearance. Stomach/Bowel: Normal appearance of the stomach. No pathologic dilatation of small bowel or colon. A few scattered colonic diverticulae are noted, most evident in the sigmoid colon, without surrounding inflammatory changes to suggest an acute diverticulitis at this time. The appendix is not confidently identified and may be surgically absent. Regardless, there are no inflammatory changes noted adjacent to the cecum to suggest the presence of an acute appendicitis at this time. Vascular/Lymphatic: Aortic atherosclerosis, without evidence of aneurysm or dissection in the abdominal or pelvic vasculature. Vascular findings pertinent to pancreatic mass, as detailed above. Numerous prominent borderline enlarged and mildly enlarged retroperitoneal lymph nodes are noted measuring up to 1.1 cm in short axis in the left para-aortic nodal station adjacent to the left renal hilum (axial image 108 of series 7). No other definite pathologically enlarged abdominal or pelvic lymph nodes are identified. Reproductive: Status post hysterectomy. Ovaries are not confidently identified may be surgically absent or atrophic. Other: No significant volume of ascites.  No pneumoperitoneum. Musculoskeletal: There are no aggressive appearing lytic or blastic lesions noted in the visualized portions of the skeleton. IMPRESSION: 1. 2.9 x 2.1 x 3.1  cm hypovascular mass in the head and uncinate process of the pancreas, similar to prior abdominal MRI 02/21/2020. This is again associated with pancreatic ductal dilatation, and some retroperitoneal lymphadenopathy noted on today's examination, concerning for nodal spread of disease. The lesion is intimately associated with both the infrarenal abdominal aorta and the superior mesenteric artery, as  detailed above. 2. Interval placement of common bile duct stent which appears appropriately located. 3. No definite evidence of metastatic disease in the lungs. There is a nonspecific 4 mm pulmonary nodule in the superior segment of the left lower lobe. Attention on follow-up studies is recommended to ensure stability. 4. Colonic diverticulosis without evidence of acute diverticulitis at this time. 5. Aortic atherosclerosis, in addition to left main and 3 vessel coronary artery disease. 6. Additional incidental findings, as above. Electronically Signed   By: Vinnie Langton M.D.   On: 03/19/2020 11:36   CT ABDOMEN PELVIS W CONTRAST  Result Date: 03/19/2020 CLINICAL DATA:  64 year old female with history of pancreatic cancer. 20 pound weight loss since January 2021. Mid abdominal pain radiating to the back since March 2020. EXAM: CT CHEST, ABDOMEN, AND PELVIS WITH CONTRAST TECHNIQUE: Multidetector CT imaging of the chest, abdomen and pelvis was performed following the standard protocol during bolus administration of intravenous contrast. CONTRAST:  156mL OMNIPAQUE IOHEXOL 300 MG/ML  SOLN COMPARISON:  Abdominal MRI 02/21/2020. FINDINGS: CT CHEST FINDINGS Cardiovascular: Heart size is borderline enlarged. There is no significant pericardial fluid, thickening or pericardial calcification. There is aortic atherosclerosis, as well as atherosclerosis of the great vessels of the mediastinum and the coronary arteries, including calcified atherosclerotic plaque in the left main, left anterior descending, left circumflex and right coronary arteries. Mediastinum/Nodes: No pathologically enlarged mediastinal or hilar lymph nodes. Esophagus is unremarkable in appearance. No axillary lymphadenopathy. Lungs/Pleura: 4 mm pulmonary nodule in the superior segment of the left lower lobe (axial image 46 of series 12), nonspecific. No other larger more suspicious appearing pulmonary nodules or masses are noted. No acute  consolidative airspace disease. No pleural effusions. Linear areas of scarring or subsegmental atelectasis in the right middle lobe and inferior segment of the lingula incidentally noted. Musculoskeletal: Chronic appearing compression fracture with 25% loss of anterior vertebral body height. Old healed fracture of the mid sternum. There are no aggressive appearing lytic or blastic lesions noted in the visualized portions of the skeleton. CT ABDOMEN PELVIS FINDINGS Hepatobiliary: No discrete cystic or solid hepatic lesions are confidently identified on today's examination. Gallbladder is unremarkable in appearance. Small amount of pneumobilia related to prior sphincterotomy. Common bile duct stent noted in the common bile duct. Pancreas: Previously noted hypovascular mass in the head and uncinate process of the pancreas is again noted (best appreciated on axial image 101 of series 7 and coronal image 49 of series 8) where this measures approximately 2.9 x 2.1 x 3.1 cm. This lesion comes in close proximity to the superior mesenteric artery with apparent contact with the proximal superior mesenteric artery from approximately 10:00 to 6:00 (axial image 107 of series 7). The anterior aspect of the lesion also comes in close proximity to the splenoportal confluence, without definite contact or distortion of the splenoportal confluence or the proximal portal vein. The posterior and inferior aspect of the lesion comes in direct contact with the anterior surface of the abdominal aorta immediately below the level of the renal arteries. Dilatation of the main pancreatic duct which measures up to 8 mm in the body and head of the pancreas. Mild  atrophy throughout the distal body and tail of the pancreas. Spleen: Unremarkable. Adrenals/Urinary Tract: Bilateral kidneys and adrenal glands are normal in appearance. No hydroureteronephrosis. Urinary bladder is normal in appearance. Stomach/Bowel: Normal appearance of the stomach. No  pathologic dilatation of small bowel or colon. A few scattered colonic diverticulae are noted, most evident in the sigmoid colon, without surrounding inflammatory changes to suggest an acute diverticulitis at this time. The appendix is not confidently identified and may be surgically absent. Regardless, there are no inflammatory changes noted adjacent to the cecum to suggest the presence of an acute appendicitis at this time. Vascular/Lymphatic: Aortic atherosclerosis, without evidence of aneurysm or dissection in the abdominal or pelvic vasculature. Vascular findings pertinent to pancreatic mass, as detailed above. Numerous prominent borderline enlarged and mildly enlarged retroperitoneal lymph nodes are noted measuring up to 1.1 cm in short axis in the left para-aortic nodal station adjacent to the left renal hilum (axial image 108 of series 7). No other definite pathologically enlarged abdominal or pelvic lymph nodes are identified. Reproductive: Status post hysterectomy. Ovaries are not confidently identified may be surgically absent or atrophic. Other: No significant volume of ascites.  No pneumoperitoneum. Musculoskeletal: There are no aggressive appearing lytic or blastic lesions noted in the visualized portions of the skeleton. IMPRESSION: 1. 2.9 x 2.1 x 3.1 cm hypovascular mass in the head and uncinate process of the pancreas, similar to prior abdominal MRI 02/21/2020. This is again associated with pancreatic ductal dilatation, and some retroperitoneal lymphadenopathy noted on today's examination, concerning for nodal spread of disease. The lesion is intimately associated with both the infrarenal abdominal aorta and the superior mesenteric artery, as detailed above. 2. Interval placement of common bile duct stent which appears appropriately located. 3. No definite evidence of metastatic disease in the lungs. There is a nonspecific 4 mm pulmonary nodule in the superior segment of the left lower lobe.  Attention on follow-up studies is recommended to ensure stability. 4. Colonic diverticulosis without evidence of acute diverticulitis at this time. 5. Aortic atherosclerosis, in addition to left main and 3 vessel coronary artery disease. 6. Additional incidental findings, as above. Electronically Signed   By: Vinnie Langton M.D.   On: 03/19/2020 11:36   DG ERCP  Result Date: 03/07/2020 CLINICAL DATA:  ERCP. EXAM: ERCP TECHNIQUE: Multiple spot images obtained with the fluoroscopic device and submitted for interpretation post-procedure. FLUOROSCOPY TIME:  Fluoroscopy Time:  166.9 seconds Number of Acquired Spot Images: 16 COMPARISON:  February 29, 2020 FINDINGS: The patient has undergone ERCP with stent placement. Initial images demonstrate cannulation of the common bile duct with injection of contrast. There is a high-grade stenosis or filling defect that appears to be located within the mid CBD. There has been interval placement of a stent. The stent remains narrow at the level of the filling defect. There is intrahepatic biliary ductal dilatation with retention of injected contrast. IMPRESSION: Status post ERCP as above. These images were submitted for radiologic interpretation only. Please see the procedural report for the amount of contrast and the fluoroscopy time utilized. Electronically Signed   By: Constance Holster M.D.   On: 03/07/2020 23:02   DG Abd 2 Views  Result Date: 04/01/2020 CLINICAL DATA:  Bowel obstruction EXAM: ABDOMEN - 2 VIEW COMPARISON:  Abdominal CT 03/19/2020 FINDINGS: Formed stool throughout the distal colon. No evidence of small bowel obstruction. No evidence of pneumoperitoneum. Metallic CBD stent. Clear lung bases. IMPRESSION: 1. Nonobstructive bowel gas pattern. 2. Stool throughout the distal colon. Electronically  Signed   By: Monte Fantasia M.D.   On: 04/01/2020 05:35   IR IMAGING GUIDED PORT INSERTION  Result Date: 03/26/2020 CLINICAL DATA:  Pancreatic carcinoma and need for  porta cath to begin chemotherapy. EXAM: IMPLANTED PORT A CATH PLACEMENT WITH ULTRASOUND AND FLUOROSCOPIC GUIDANCE ANESTHESIA/SEDATION: 1.5 mg IV Versed; 75 mcg IV Fentanyl Total Moderate Sedation Time:  30 minutes The patient's level of consciousness and physiologic status were continuously monitored during the procedure by Radiology nursing. Additional Medications: 2 g IV Ancef. FLUOROSCOPY TIME:  24 seconds.  1.4 mGy. PROCEDURE: The procedure, risks, benefits, and alternatives were explained to the patient. Questions regarding the procedure were encouraged and answered. The patient understands and consents to the procedure. A time-out was performed prior to initiating the procedure. Ultrasound was utilized to confirm patency of the right internal jugular vein. The right neck and chest were prepped with chlorhexidine in a sterile fashion, and a sterile drape was applied covering the operative field. Maximum barrier sterile technique with sterile gowns and gloves were used for the procedure. Local anesthesia was provided with 1% lidocaine. After creating a small venotomy incision, a 21 gauge needle was advanced into the right internal jugular vein under direct, real-time ultrasound guidance. Ultrasound image documentation was performed. After securing guidewire access, an 8 Fr dilator was placed. A J-wire was kinked to measure appropriate catheter length. A subcutaneous port pocket was then created along the upper chest wall utilizing sharp and blunt dissection. Portable cautery was utilized. The pocket was irrigated with sterile saline. A single lumen power injectable port was chosen for placement. The 8 Fr catheter was tunneled from the port pocket site to the venotomy incision. The port was placed in the pocket. External catheter was trimmed to appropriate length based on guidewire measurement. At the venotomy, an 8 Fr peel-away sheath was placed over a guidewire. The catheter was then placed through the sheath  and the sheath removed. Final catheter positioning was confirmed and documented with a fluoroscopic spot image. The port was accessed with a needle and aspirated and flushed with heparinized saline. The access needle was removed. The venotomy and port pocket incisions were closed with subcutaneous 3-0 Monocryl and subcuticular 4-0 Vicryl. Dermabond was applied to both incisions. COMPLICATIONS: COMPLICATIONS None FINDINGS: After catheter placement, the tip lies at the cavo-atrial junction. The catheter aspirates normally and is ready for immediate use. IMPRESSION: Placement of single lumen port a cath via right internal jugular vein. The catheter tip lies at the cavo-atrial junction. A power injectable port a cath was placed and is ready for immediate use. Electronically Signed   By: Aletta Edouard M.D.   On: 03/26/2020 15:50    ASSESSMENT AND PLAN: 1. 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 MRI abdomen 02/01/2020-2.2 x 3.3 cm mass in the posterior pancreas head/uncinate process, mild intrahepatic/extrahepatic ductal dilatation, no evidence of vascular invasion, possible small lymph nodes in the porta hepatis-poorly visualized ? EUS 02/29/2020-20 x 23 mm mass in the pancreas head, upstream pancreatic duct dilatation, 1 abnormal peripancreatic node, uT3?uN1 ? 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 CTs 03/19/2020-2.9 x 2.1 x 3.1 cm pancreas head/uncinate mass, lesion associated with infrarenal abdominal aorta and superior mesenteric artery, prominent and borderline enlarged retroperitoneal nodes including a left periaortic node, indeterminate 4 mm lung nodule ? PET scan pending, scheduled on 04/03/20  2. Abdomen/back pain secondary to #1 3. Anorexia/weight loss secondary to #1 4. History of coronary  artery disease,STEMI with the fib arrest 2011, status post RCA stent 5.   Constipation, secondary to #1 and morphine  6.   Nausea  and vomiting following cycle 1 FOLFIRINOX  Ms. Origel is now admitted secondary to chemo induced nausea and vomiting.  She has not vomited since admission.  Still having intermittent nausea, but tolerating clear liquids.  Recommendations: 1.  Advance to full liquids today and if tolerating, she may be discharged home later today. 2.  She has been started on dexamethasone 8 mg IV twice daily x3 days.  Recommend that she be discharged home on dexamethasone 4 mg twice daily for a total of 3 days. 3.  Continue lorazepam 0.5 mg p.o. or sublingual every 6 hours as needed for nausea and vomiting. 4.  Keep appointment for second opinion at [facility] as previously scheduled on 04/02/2020.   LOS: 0 days   Mikey Bussing, DNP, AGPCNP-BC, AOCNP 04/01/20 Ms. Billen was interviewed and examined.  She is now at day 5 following cycle 1 FOLFIRINOX.  The chemotherapy was complicated by nausea and vomiting.  She is tolerating liquids this morning.  I recommend Decadron prophylaxis for several days for treatment of delayed nausea.  We will add lorazepam to use as needed.  She can be discharged home if she is tolerating liquids.  We will adjust the antiemetic regimen with cycle 2.  Ms. Kwasnik will contact us for recurrent nausea.  We will call her tomorrow.  She is scheduled for a staging PET scan on 04/03/2020.  Julieanne Manson, MD

## 2020-04-02 ENCOUNTER — Encounter: Payer: Self-pay | Admitting: Nurse Practitioner

## 2020-04-02 ENCOUNTER — Inpatient Hospital Stay (HOSPITAL_BASED_OUTPATIENT_CLINIC_OR_DEPARTMENT_OTHER): Payer: 59 | Admitting: Nurse Practitioner

## 2020-04-02 DIAGNOSIS — C250 Malignant neoplasm of head of pancreas: Secondary | ICD-10-CM

## 2020-04-02 NOTE — Progress Notes (Signed)
[place]   [phone] [phone]   Clinic Follow up Note   Patient Care Team: Midge Minium, MD as PCP - General (Family Medicine) Lorretta Harp, MD as PCP - Cardiology (Cardiology) Lorretta Harp, MD as Consulting Physician (Cardiology) Everlene Farrier, MD as Consulting Physician (Obstetrics and Gynecology) Wonda Horner, MD as Consulting Physician (Gastroenterology) Harriett Sine, MD as Consulting Physician (Dermatology) Jonnie Finner, RN as Oncology Nurse Navigator 04/02/2020   I connected with Vicki [name] on 04/02/20 at 2:54 PM EDT by telephone visit and verified that I am speaking with the correct person using two identifiers.   I discussed the limitations, risks, security and privacy concerns of performing an evaluation and management service by telemedicine and the availability of in-person appointments. I also discussed with the patient that there may be a patient responsible charge related to this service. The patient expressed understanding and agreed to proceed.   Other persons participating in the visit and their role in the encounter: Spouse   Patient's location: personal vehicle Provider's location: [place] office   CHIEF COMPLAINT: F/u pancreas cancer, hospital f/u for n/v  CURRENT THERAPY: mFOLFIRINOX q2 weeks starting 03/28/20   INTERVAL HISTORY: Vicki [name] presents for virtual visit. She notes "today was awesome." She had a good visit at [facility] who "felt positive about shrinking this and possibly doing surgery." They agreed with chemo regimen. They plan to see her back after chemo. Starting on day 2 after chemo, she became nauseous and weak. Compazine did not help. She presented on day 3 for pump d/c with n/v and difficulty keeping po down. She was given IV zofran. She went home and on day 4 had persistent n/v and weakness and was instructed by on call team to come to ED. She was admitted, hydrated, and given IV  steroids. She has not been able to fill the prescription yet but is picking it up now. She has no n/v since hospital discharge on 04/01/20. She is able to eat and drink better. Still has not had good BM, but has not eaten much. She will take miralax. Cold sensitivity was not an issue. Denies mucositis, diarrhea, fever, chills, hand/foot syndrome, cough, chest pain, dyspnea, bleeding, or skin rash. She has no other concerns.    MEDICAL HISTORY:  Past Medical History:  Diagnosis Date  . CAD (coronary artery disease) 2011   with stent to RCA and 60 % LAD disease  . Clotting disorder ([place])   . Family history of breast cancer   . Family history of cystic fibrosis   . H/O cardiac arrest 02/15/10   with STEMI- Inf wall  . Hyperlipidemia   . Hypertension   . Myocardial infarct ([place]) 02/15/10  . pancreatic ca dx'd 02/2020  . Shock, cardiogenic ([place]) 01/2010   with MI, IABP    SURGICAL HISTORY: Past Surgical History:  Procedure Laterality Date  . ABDOMINAL HYSTERECTOMY  1987   BSO  . APPENDECTOMY  1987  . BILIARY STENT PLACEMENT N/A 03/05/2020   Procedure: BILIARY STENT PLACEMENT;  Surgeon: Clarene Essex, MD;  Location: [facility];  Service: Endoscopy;  Laterality: N/A;  . CORONARY ANGIOPLASTY WITH STENT PLACEMENT  02/15/10   Stent to prox RCA -BMS  . DOPPLER ECHOCARDIOGRAPHY  05/06/2010   EF =50-55%  lvfx low normal ;no sigificant valvular disease seen  . ENDOSCOPIC RETROGRADE CHOLANGIOPANCREATOGRAPHY (ERCP) WITH PROPOFOL N/A 02/29/2020   Procedure: ENDOSCOPIC RETROGRADE CHOLANGIOPANCREATOGRAPHY (ERCP) WITH PROPOFOL;  Surgeon: Arta Silence,  MD;  Location: [facility];  Service: Endoscopy;  Laterality: N/A;  . ERCP N/A 03/05/2020   Procedure: ENDOSCOPIC RETROGRADE CHOLANGIOPANCREATOGRAPHY (ERCP);  Surgeon: Clarene Essex, MD;  Location: [facility];  Service: Endoscopy;  Laterality: N/A;  with stent placement  . ESOPHAGOGASTRODUODENOSCOPY (EGD) WITH PROPOFOL N/A 02/29/2020   Procedure:  ESOPHAGOGASTRODUODENOSCOPY (EGD) WITH PROPOFOL;  Surgeon: Arta Silence, MD;  Location: [facility];  Service: Endoscopy;  Laterality: N/A;  . EXPLORATORY LAPAROTOMY    . FINE NEEDLE ASPIRATION  02/29/2020   Procedure: FINE NEEDLE ASPIRATION;  Surgeon: Arta Silence, MD;  Location: [facility];  Service: Endoscopy;;  . HERNIA REPAIR  1980   double bilateral  . IR IMAGING GUIDED PORT INSERTION  03/26/2020  . MASS EXCISION  09/02/2012   Procedure: EXCISION MASS;  Surgeon: Earnstine Regal, MD;  Location: [place];  Service: General;  Laterality: Left;  Excise mass Left posterior neck  . NM MYOCAR PERF WALL MOTION  05/06/2010   exercise cap 8 mets. ,mild perfusion defect in basal infer.,mid infer., apical infer., region consistent with infarct/scar. no significant ischemia  . PANCREATIC STENT PLACEMENT  02/29/2020   Procedure: PANCREATIC STENT PLACEMENT;  Surgeon: Arta Silence, MD;  [facility];  Service: Endoscopy;;  . right chest wall exploration    . SPHINCTEROTOMY  02/29/2020   Procedure: SPHINCTEROTOMY;  Surgeon: Arta Silence, MD;  Location: [facility];  Service: Endoscopy;;  . Vicki [name]  03/05/2020   Procedure: Vicki [name];  Surgeon: Clarene Essex, MD;  [facility]: [facility];  Service: Endoscopy;;  . UPPER ESOPHAGEAL ENDOSCOPIC ULTRASOUND (EUS) N/A 02/29/2020   Procedure: UPPER ESOPHAGEAL ENDOSCOPIC ULTRASOUND (EUS)  LINEAR SCOPE;  Surgeon: Arta Silence, MD;  [facility];  Service: Endoscopy;  Laterality: N/A;  DR. NEEDS TWO HOURS FOR PROCEDURE    I have reviewed the social history and family history with the patient and they are unchanged from previous note.  ALLERGIES:  is allergic to chlorhexidine.  MEDICATIONS:  Current Outpatient Medications  Medication Sig Dispense Refill  . ALPRAZolam (XANAX) 0.25 MG tablet Take 1 tablet (0.25 mg total) by mouth at bedtime as needed. (Patient taking differently: Take 0.25 mg by mouth at bedtime. )  30 tablet 3  . aspirin 81 MG tablet Take 81 mg by mouth at bedtime.     . budesonide (ENTOCORT EC) 3 MG 24 hr capsule Take 3 mg by mouth daily.   5  . cetirizine (ZYRTEC) 10 MG tablet Take 10 mg by mouth at bedtime.     . Cholecalciferol (VITAMIN D3) 5000 units CAPS Take 5,000 Units by mouth at bedtime.     . clopidogrel (PLAVIX) 75 MG tablet Take 75 mg by mouth daily.    Marland Kitchen dexamethasone (DECADRON) 4 MG tablet Take 1 tablet (4 mg total) by mouth 2 (two) times daily for 2 days. 4 tablet 0  . escitalopram (LEXAPRO) 10 MG tablet Take 1 tablet (10 mg total) by mouth daily. (Patient taking differently: Take 5 mg by mouth in the morning and at bedtime. ) 90 tablet 0  . lidocaine-prilocaine (EMLA) cream Apply 1 application topically as directed. Apply to port site 1 hour prior to stick and cover with plastic wrap 30 g 1  . lisinopril (ZESTRIL) 5 MG tablet Take 1 tablet (5 mg total) by mouth daily. 30 tablet 11  . LORazepam (ATIVAN) 0.5 MG tablet Take 1 tablet (0.5 mg total) by mouth every 6 (six) hours as needed for up to 3 days (nausea). 12 tablet  0  . metoprolol tartrate (LOPRESSOR) 50 MG tablet TAKE 1 TABLET(50 MG) BY MOUTH TWICE DAILY (Patient taking differently: Take 25 mg by mouth 2 (two) times daily. ) 180 tablet 1  . montelukast (SINGULAIR) 10 MG tablet TAKE 1 TABLET(10 MG) BY MOUTH AT BEDTIME (Patient taking differently: Take 10 mg by mouth at bedtime. ) 30 tablet 6  . morphine (MS CONTIN) 15 MG 12 hr tablet Take 1 tablet (15 mg total) by mouth every 12 (twelve) hours. 30 tablet 0  . nitroGLYCERIN (NITROSTAT) 0.4 MG SL tablet PLACE 1 TABLET UNDER THE TONGUE EVERY 5 MINUTES AS NEEDED FOR CHEST PAIN 25 tablet 1  . Omega-3 1000 MG CAPS Take 1,000 mg by mouth at bedtime.    . ondansetron (ZOFRAN) 8 MG tablet Take 1 tablet (8 mg total) by mouth every 8 (eight) hours as needed for nausea or vomiting. 60 tablet 1  . oxyCODONE-acetaminophen (PERCOCET) 10-325 MG tablet Take 1 tablet by mouth every 4 (four)  hours as needed for pain. (Patient taking differently: Take 1 tablet by mouth every 5 (five) hours as needed for pain. ) 80 tablet 0  . pantoprazole (PROTONIX) 40 MG tablet Take 1 tablet (40 mg total) by mouth 2 (two) times daily. 180 tablet 3  . polyethylene glycol (MIRALAX / GLYCOLAX) 17 g packet Take 17 g by mouth daily as needed for mild constipation or moderate constipation.     . potassium chloride SA (KLOR-CON) 20 MEQ tablet TAKE 1 TABLET(20 MEQ) BY MOUTH DAILY (Patient taking differently: Take 20 mEq by mouth daily. ) 90 tablet 3  . prochlorperazine (COMPAZINE) 10 MG tablet Take 1 tablet (10 mg total) by mouth every 6 (six) hours as needed for nausea. 60 tablet 1  . rosuvastatin (CRESTOR) 10 MG tablet TAKE 1 TABLET(10 MG) BY MOUTH DAILY (Patient taking differently: Take 10 mg by mouth daily. ) 90 tablet 3   No current facility-administered medications for this visit.    PHYSICAL EXAMINATION:  There were no vitals filed for this visit. There were no vitals filed for this visit.  Patient appears well over the phone. Speech is clear. Normal respiratory effort during conversation. Mood appears appropriate.   LABORATORY DATA:  I have reviewed the data as listed CBC Latest Ref Rng & Units 04/01/2020 03/31/2020 03/12/2020  WBC 4.0 - 10.5 K/uL 6.2 6.1 13.0(H)  Hemoglobin 12.0 - 15.0 g/dL 10.7(L) 11.3(L) 11.5(L)  Hematocrit 36.0 - 46.0 % 32.2(L) 33.4(L) 35.6(L)  Platelets 150 - 400 K/uL 209 225 451(H)     CMP Latest Ref Rng & Units 04/01/2020 03/31/2020 03/28/2020  Glucose 70 - 99 mg/dL 97 99 113(H)  BUN 8 - 23 mg/dL 12 15 10   Creatinine 0.44 - 1.00 mg/dL 0.65 0.69 0.68  Sodium 135 - 145 mmol/L 137 137 139  Potassium 3.5 - 5.1 mmol/L 3.4(L) 3.4(L) 4.5  Chloride 98 - 111 mmol/L 105 104 108  CO2 22 - 32 mmol/L 23 24 23   Calcium 8.9 - 10.3 mg/dL 8.2(L) 8.8(L) 8.7(L)  Total Protein 6.5 - 8.1 g/dL 5.7(L) 6.3(L) 6.2(L)  Total Bilirubin 0.3 - 1.2 mg/dL 0.7 1.4(H) 0.7  Alkaline Phos 38 - 126 U/L  100 111 152(H)  AST 15 - 41 U/L 26 29 18   ALT 0 - 44 U/L 22 23 15       RADIOGRAPHIC STUDIES: I have personally reviewed the radiological images as listed and agreed with the findings in the report. DG Abd 2 Views  Result Date: 04/01/2020 CLINICAL  DATA:  Bowel obstruction EXAM: ABDOMEN - 2 VIEW COMPARISON:  Abdominal CT 03/19/2020 FINDINGS: Formed stool throughout the distal colon. No evidence of small bowel obstruction. No evidence of pneumoperitoneum. Metallic CBD stent. Clear lung bases. IMPRESSION: 1. Nonobstructive bowel gas pattern. 2. Stool throughout the distal colon. Electronically Signed   By: Monte Fantasia M.D.   On: 04/01/2020 05:35     ASSESSMENT & PLAN:   1. 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 MRI abdomen 02/01/2020-2.2 x 3.3 cm mass in the posterior pancreas head/uncinate process, mild intrahepatic/extrahepatic ductal dilatation, no evidence of vascular invasion, possible small lymph nodes in the porta hepatis-poorly visualized ? EUS 02/29/2020-20 x 23 mm mass in the pancreas head, upstream pancreatic duct dilatation, 1 abnormal peripancreatic node, uT3?uN1 ? 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 CTs 03/19/2020-2.9 x 2.1 x 3.1 cm pancreas head/uncinate mass, lesion associated with infrarenal abdominal aorta and superior mesenteric artery, prominent and borderline enlarged retroperitoneal nodes including a left periaortic node, indeterminate 4 mm lung nodule ? Cycle 1 mFOLFIRINOX [id number], complicated by n/v and hospital admission 5/2/ - 5/3 ? Second opinion at [facility] with Dr. Earnestine Mealing and Dr. Mariah Milling 04/02/20  ? PET scan pending, scheduled on 04/03/20  2. Abdomen/back pain secondary to #1 3. Anorexia/weight loss secondary to #1 4. History of coronary artery disease,STEMI with the fib arrest 2011, status post RCA stent 5.   Constipation, secondary to #1 and morphine  6.   Hospital admission for n/v  after cycle 1 FOLFIRINOX 5/2 - 5/3   Disposition:  [name] appears stable over the phone. She received cycle 1 FOLFIRINOX on [id number] that was complicated by n/v starting on day 2 and required hospital admission for hydration and IV steroids. She is recovering well, no recurrent n/v since discharge. Appetite and hydration appear improved. She declined office visit for additional IV fluids at this time. She is otherwise doing well.   She was seen for second opinion at [facility] today. She reports the team was optimistic about possible surgery in the future. She states they agree with her current treatment plan. The note is not available yet for my review.   She will proceed with PET scan on 04/03/20. She will return for f/u and cycle 2 on 04/11/20. She knows to call sooner if she has any new or worsening concerns in the interval.   All questions were answered. The patient knows to call the clinic with any problems, questions or concerns. No barriers to learning was detected. I spent 10 minutes non-face-to-face time in today's encounter.      Alla Feeling, NP 04/02/20

## 2020-04-03 ENCOUNTER — Encounter: Payer: Self-pay | Admitting: Genetic Counselor

## 2020-04-03 ENCOUNTER — Telehealth: Payer: Self-pay | Admitting: Nurse Practitioner

## 2020-04-03 ENCOUNTER — Telehealth: Payer: Self-pay | Admitting: Genetic Counselor

## 2020-04-03 ENCOUNTER — Other Ambulatory Visit: Payer: Self-pay

## 2020-04-03 ENCOUNTER — Ambulatory Visit (HOSPITAL_COMMUNITY)
Admission: RE | Admit: 2020-04-03 | Discharge: 2020-04-03 | Disposition: A | Discharge status: Home / Self Care | Payer: 59 | Source: Ambulatory Visit | Attending: Oncology | Admitting: Oncology

## 2020-04-03 DIAGNOSIS — C259 Malignant neoplasm of pancreas, unspecified: Secondary | ICD-10-CM | POA: Insufficient documentation

## 2020-04-03 DIAGNOSIS — Z1379 Encounter for other screening for genetic and chromosomal anomalies: Secondary | ICD-10-CM | POA: Insufficient documentation

## 2020-04-03 LAB — GLUCOSE, CAPILLARY: Glucose-Capillary: 97 mg/dL (ref 70–99)

## 2020-04-03 MED ORDER — FLUDEOXYGLUCOSE F - 18 (FDG) INJECTION
7.1600 | Freq: Once | INTRAVENOUS | Status: AC
  Administered 2020-04-03: 7.16 via INTRAVENOUS

## 2020-04-03 NOTE — Telephone Encounter (Signed)
LM on VM that results are back and to please call. 

## 2020-04-03 NOTE — Telephone Encounter (Signed)
No los per 5/4. °

## 2020-04-04 ENCOUNTER — Other Ambulatory Visit: Payer: Self-pay | Admitting: General Practice

## 2020-04-04 MED ORDER — ESCITALOPRAM OXALATE 10 MG PO TABS: 10.0000 mg | ORAL_TABLET | Freq: Every day | ORAL | 0 refills | Status: DC

## 2020-04-05 ENCOUNTER — Encounter: Payer: Self-pay | Admitting: General Practice

## 2020-04-05 ENCOUNTER — Telehealth: Payer: Self-pay

## 2020-04-05 NOTE — Telephone Encounter (Signed)
Mychart message sent to advise pt she would need an appt.

## 2020-04-05 NOTE — Progress Notes (Signed)
Pharmacist Chemotherapy Monitoring - Follow Up Assessment    I verify that I have reviewed each item in the below checklist:  . Regimen for the patient is scheduled for the appropriate day and plan matches scheduled date. Marland Kitchen Appropriate non-routine labs are ordered dependent on drug ordered. . If applicable, additional medications reviewed and ordered per protocol based on lifetime cumulative doses and/or treatment regimen.   Plan for follow-up and/or issues identified: Yes- need d/c pump appt . I-vent associated with next due treatment: No . MD and/or nursing notified: No  Adelina Mings 04/05/2020 8:24 AM

## 2020-04-05 NOTE — Telephone Encounter (Signed)
Patient would like to discuss some health concern with Dr. Birdie Riddle. Please follow up with patient.

## 2020-04-05 NOTE — Telephone Encounter (Signed)
LM on VM that results are back and to please call. 

## 2020-04-07 ENCOUNTER — Other Ambulatory Visit: Payer: Self-pay | Admitting: Oncology

## 2020-04-08 ENCOUNTER — Telehealth: Payer: Self-pay | Admitting: Nurse Practitioner

## 2020-04-08 ENCOUNTER — Other Ambulatory Visit: Payer: Self-pay | Admitting: Nurse Practitioner

## 2020-04-08 ENCOUNTER — Other Ambulatory Visit: Payer: Self-pay | Admitting: Emergency Medicine

## 2020-04-08 ENCOUNTER — Other Ambulatory Visit: Payer: Self-pay

## 2020-04-08 DIAGNOSIS — C250 Malignant neoplasm of head of pancreas: Secondary | ICD-10-CM

## 2020-04-08 DIAGNOSIS — R300 Dysuria: Secondary | ICD-10-CM

## 2020-04-08 MED ORDER — MORPHINE SULFATE ER 15 MG PO TBCR: 15.0000 mg | EXTENDED_RELEASE_TABLET | Freq: Two times a day (BID) | ORAL | 0 refills | Status: DC

## 2020-04-08 NOTE — Telephone Encounter (Signed)
I called Vicki [name] to review her PET scan from 04/03/20 which shows hypermetabolic known biopsy-proven pancreatic head mass and hypermetabolic retroperitoneal adenopathy. There was incidental finding of hypermetabolic left supraclavicular adenopathy. She notes that Dr. Barry Dienes was able to palpate a nodule there at her visit last week. The patient denies recent upper airway/respiratory infection. The overall picture is concerning for distant nodal metastasis. Dr. Benay Spice is recommending a biopsy of the left supraclav node for diagnosis, Vicki [name] agrees. The order has been placed.   She is asking about a nerve block for her back pain and if it can be done with the LN biopsy. I will investigate this for her. Her pain medication was refilled by [name], NP today. Patient will come in for UA on 5/11 as discussed with our nurse navigator Malachy Mood today, and will see [name] before chemo on 5/13. She understands the plan and appreciates the call.   Cira Rue, NP

## 2020-04-08 NOTE — Progress Notes (Signed)
Patient called with a lot of questions/concerns. she has an appointment on Thursday.   She did ask about a refill on her MS Contin - which I see has been sent in today.   She is almost out of Oxycodone. Needs refill - This will be refilled today.   She continues to have quite a bit of nausea and is wandering if this could be coming from the Morphine. Explained to her could be coming from Morphine or from the cancer.  She verbalized an understanding.    She is wanting to know if anything is going to be different about her treatment Thursday that might help with the nausea and now having diarrhea. My understanding is Dr. Benay Spice was going to add in Decadron. Informed her we are adding Decadron prior to her infusion which should help quite a bit with nausea.   Diarrhea is intermittent.  I instructed her to have Imodium on hand and use this.  To increase fluid intake.   She saw the surgeon on Friday (?[place]) and she mentioned something about her getting a nerve block in her back. Wanted our feelings on this? Informed her we will discuss further at her visit on Thursday   Also having burning with urination as well. Explained to her we need for her to come in and provide a urine sample for testing for infection.  She will come at 10:00 am on 5/11.  The order has been placed.

## 2020-04-09 ENCOUNTER — Other Ambulatory Visit: Payer: Self-pay | Admitting: Nurse Practitioner

## 2020-04-09 ENCOUNTER — Telehealth: Payer: Self-pay | Admitting: *Deleted

## 2020-04-09 ENCOUNTER — Encounter (HOSPITAL_COMMUNITY): Payer: Self-pay | Admitting: Radiology

## 2020-04-09 ENCOUNTER — Other Ambulatory Visit: Payer: Self-pay

## 2020-04-09 ENCOUNTER — Inpatient Hospital Stay: Payer: 59

## 2020-04-09 ENCOUNTER — Telehealth: Payer: Self-pay | Admitting: General Practice

## 2020-04-09 DIAGNOSIS — R300 Dysuria: Secondary | ICD-10-CM

## 2020-04-09 DIAGNOSIS — Z5111 Encounter for antineoplastic chemotherapy: Secondary | ICD-10-CM | POA: Diagnosis not present

## 2020-04-09 LAB — URINALYSIS, COMPLETE (UACMP) WITH MICROSCOPIC
Bilirubin Urine: NEGATIVE
Glucose, UA: NEGATIVE mg/dL
Hgb urine dipstick: NEGATIVE
Ketones, ur: NEGATIVE mg/dL
Nitrite: NEGATIVE
Protein, ur: NEGATIVE mg/dL
Specific Gravity, Urine: 1.024 (ref 1.005–1.030)
pH: 5 (ref 5.0–8.0)

## 2020-04-09 MED ORDER — ALPRAZOLAM 0.25 MG PO TABS: 0.2500 mg | ORAL_TABLET | Freq: Every day | ORAL | 3 refills | Status: DC

## 2020-04-09 MED ORDER — CIPROFLOXACIN HCL 500 MG PO TABS: 500.0000 mg | ORAL_TABLET | Freq: Two times a day (BID) | ORAL | 0 refills | Status: DC

## 2020-04-09 MED ORDER — OXYCODONE-ACETAMINOPHEN 10-325 MG PO TABS: 1.0000 | ORAL_TABLET | ORAL | 0 refills | Status: DC | PRN

## 2020-04-09 NOTE — Telephone Encounter (Signed)
Notified patient that urinalysis showed trace bacteria. Since she is symptomatic, MD has ordered Cipro bid x 5 days. Script sent and informed her that the oxycodone was also sent in a few minutes earlier.

## 2020-04-09 NOTE — Telephone Encounter (Signed)
Last OV 10/20/19 Alprazolam last filled 11/02/19 #30 with 3

## 2020-04-09 NOTE — Progress Notes (Signed)
Zollie Scale Female, 64 y.o., 1956/06/08 MRN:  [id number] Phone:  [phone] [name]) PCP:  Midge Minium, MD Coverage:  [place] [employer] Other Next Appt With Oncology 04/09/2020 at 10:00 AM  RE: [place] Core Biopsy (Lymph Nodes) Received: Today Message Contents  Alla Feeling, NP  Garth Bigness D  Yes that's ok. Please let me know when you can get her in and we'll tell her when to hold.   Thanks,  Lacie       Previous Messages   ----- Message -----  From: Garth Bigness D  Sent: 04/08/2020  4:47 PM EDT  To: Alla Feeling, NP  Subject: FW: [place] Core Biopsy (Lymph Nodes)         Hi, Lacie, Mrs. Glotfelty is on Plavix and will need to be off it for 5 days prior to her Biopsy. Please advise if it is okay to hold Plavix for 5 days prior.  Thanks Aniceto Boss  ----- Message -----  From: Garth Bigness D  Sent: 04/08/2020  4:43 PM EDT  To: Ir Procedure Requests  Subject: [place] Core Biopsy (Lymph Nodes)           Procedure:  [place] CORE BIOPSY (LYMPH NODES)   Reason: Malignant neoplasm of head of pancreas, r/o metastatic carcinoma to left supraclavicular LN, pancreas primary   History: MR Abd, NM PET, CT in computer   Provider: Alla Feeling   Provider Contact: [phone]

## 2020-04-09 NOTE — Progress Notes (Signed)
Vicki [name] Female, 64 y.o., [birth year] MRN:  [id number] Phone:  [phone] [name]) PCP:  Midge Minium, MD Coverage:  [place] [employer] Other Next Appt With Oncology 04/09/2020 at 10:00 AM  RE: [place] Core Biopsy (Lymph Nodes) Received: Today Message Contents  Arne Cleveland, MD  [name]  Ok   [place] bx L supraclav LAN (core if possible)   DDH       Previous Messages   ----- Message -----  From: Garth Bigness D  Sent: 04/08/2020  4:43 PM EDT  To: Ir Procedure Requests  Subject: [place] Core Biopsy (Lymph Nodes)           Procedure:  [place] CORE BIOPSY (LYMPH NODES)   Reason: Malignant neoplasm of head of pancreas, r/o metastatic carcinoma to left supraclavicular LN, pancreas primary   History: MR Abd, NM PET, CT in computer   Provider: Alla Feeling   Provider Contact: [phone]

## 2020-04-09 NOTE — Telephone Encounter (Signed)
Prescription sent to pharmacy.

## 2020-04-10 ENCOUNTER — Encounter (HOSPITAL_COMMUNITY): Payer: Self-pay | Admitting: Radiology

## 2020-04-10 NOTE — Progress Notes (Signed)
Vicki [name] Female, 64 y.o., 09-05-1956 MRN:  [id number] Phone:  [phone] Vicki [name]) PCP:  Vicki Minium, MD Coverage:  [place] [employer] Other Next Appt With Radiology ([facility]) 04/18/2020 at 1:00 PM  RE: [place] Core Biopsy (Lymph Nodes) Received: Yesterday Message Contents  Arne Cleveland, MD  [name]  Ok   [place] bx L supraclav LAN (core if possible)   DDH       Previous Messages   ----- Message -----  From: Garth Bigness D  Sent: 04/08/2020  4:43 PM EDT  To: Ir Procedure Requests  Subject: [place] Core Biopsy (Lymph Nodes)           Procedure:  [place] CORE BIOPSY (LYMPH NODES)   Reason: Malignant neoplasm of head of pancreas, r/o metastatic carcinoma to left supraclavicular LN, pancreas primary   History: MR Abd, NM PET, CT in computer   Provider: Alla Feeling   Provider Contact: [phone]

## 2020-04-10 NOTE — Progress Notes (Signed)
Vicki [name] Female, 64 y.o., [birth year] MRN:  [id number] Phone:  [phone] [name]) PCP:  Midge Minium, MD Coverage:  [place] [employer] Other Next Appt With Radiology ([facility]) 04/18/2020 at 1:00 PM  RE: [place] Core Biopsy (Lymph Nodes) Received: Yesterday Message Contents  Alla Feeling, NP  Garth Bigness D  Yes that's ok. Please let me know when you can get her in and we'll tell her when to hold.   Thanks,  Lacie       Previous Messages   ----- Message -----  From: Garth Bigness D  Sent: 04/08/2020  4:47 PM EDT  To: Alla Feeling, NP  Subject: FW: [place] Core Biopsy (Lymph Nodes)         Hi, Lacie, [name] is on Plavix and will need to be off it for 5 days prior to her Biopsy. Please advise if it is okay to hold Plavix for 5 days prior.  Thanks Aniceto Boss  ----- Message -----  From: Garth Bigness D  Sent: 04/08/2020  4:43 PM EDT  To: Ir Procedure Requests  Subject: [place] Core Biopsy (Lymph Nodes)           Procedure:  [place] CORE BIOPSY (LYMPH NODES)   Reason: Malignant neoplasm of head of pancreas, r/o metastatic carcinoma to left supraclavicular LN, pancreas primary   History: MR Abd, NM PET, CT in computer   Provider: Alla Feeling   Provider Contact: [phone]

## 2020-04-11 ENCOUNTER — Other Ambulatory Visit: Payer: Self-pay

## 2020-04-11 ENCOUNTER — Inpatient Hospital Stay (HOSPITAL_BASED_OUTPATIENT_CLINIC_OR_DEPARTMENT_OTHER): Payer: 59 | Admitting: Medical

## 2020-04-11 ENCOUNTER — Inpatient Hospital Stay (HOSPITAL_BASED_OUTPATIENT_CLINIC_OR_DEPARTMENT_OTHER): Payer: 59 | Admitting: Nurse Practitioner

## 2020-04-11 ENCOUNTER — Inpatient Hospital Stay: Payer: 59

## 2020-04-11 ENCOUNTER — Ambulatory Visit: Payer: 59 | Admitting: Nutrition

## 2020-04-11 ENCOUNTER — Encounter: Payer: Self-pay | Admitting: Nurse Practitioner

## 2020-04-11 VITALS — BP 110/60 | HR 55

## 2020-04-11 VITALS — BP 126/79 | HR 68 | Temp 98.7°F | Resp 17 | Ht 66.0 in | Wt 143.7 lb

## 2020-04-11 DIAGNOSIS — Z5111 Encounter for antineoplastic chemotherapy: Secondary | ICD-10-CM | POA: Diagnosis not present

## 2020-04-11 DIAGNOSIS — C250 Malignant neoplasm of head of pancreas: Secondary | ICD-10-CM

## 2020-04-11 DIAGNOSIS — C259 Malignant neoplasm of pancreas, unspecified: Secondary | ICD-10-CM

## 2020-04-11 DIAGNOSIS — C257 Malignant neoplasm of other parts of pancreas: Secondary | ICD-10-CM

## 2020-04-11 DIAGNOSIS — Z95828 Presence of other vascular implants and grafts: Secondary | ICD-10-CM

## 2020-04-11 LAB — CMP (CANCER CENTER ONLY)
ALT: 19 U/L (ref 0–44)
AST: 18 U/L (ref 15–41)
Albumin: 3.3 g/dL — ABNORMAL LOW (ref 3.5–5.0)
Alkaline Phosphatase: 106 U/L (ref 38–126)
Anion gap: 8 (ref 5–15)
BUN: 14 mg/dL (ref 8–23)
CO2: 22 mmol/L (ref 22–32)
Calcium: 8.8 mg/dL — ABNORMAL LOW (ref 8.9–10.3)
Chloride: 108 mmol/L (ref 98–111)
Creatinine: 0.67 mg/dL (ref 0.44–1.00)
GFR, Est AFR Am: >60 mL/min (ref >60)
GFR, Estimated: >60 mL/min (ref >60)
Glucose, Bld: 103 mg/dL — ABNORMAL HIGH (ref 70–99)
Potassium: 3.8 mmol/L (ref 3.5–5.1)
Sodium: 138 mmol/L (ref 135–145)
Total Bilirubin: 0.5 mg/dL (ref 0.3–1.2)
Total Protein: 6.4 g/dL — ABNORMAL LOW (ref 6.5–8.1)

## 2020-04-11 LAB — CBC WITH DIFFERENTIAL (CANCER CENTER ONLY)
Abs Immature Granulocytes: 0.02 10*3/uL (ref 0.00–0.07)
Basophils Absolute: 0 10*3/uL (ref 0.0–0.1)
Basophils Relative: 1 %
Eosinophils Absolute: 1.1 10*3/uL — ABNORMAL HIGH (ref 0.0–0.5)
Eosinophils Relative: 21 %
HCT: 32.8 % — ABNORMAL LOW (ref 36.0–46.0)
Hemoglobin: 10.9 g/dL — ABNORMAL LOW (ref 12.0–15.0)
Immature Granulocytes: 0 %
Lymphocytes Relative: 29 %
Lymphs Abs: 1.6 10*3/uL (ref 0.7–4.0)
MCH: 30.5 pg (ref 26.0–34.0)
MCHC: 33.2 g/dL (ref 30.0–36.0)
MCV: 91.9 fL (ref 80.0–100.0)
Monocytes Absolute: 0.6 10*3/uL (ref 0.1–1.0)
Monocytes Relative: 10 %
Neutro Abs: 2 10*3/uL (ref 1.7–7.7)
Neutrophils Relative %: 39 %
Platelet Count: 324 10*3/uL (ref 150–400)
RBC: 3.57 MIL/uL — ABNORMAL LOW (ref 3.87–5.11)
RDW: 13.3 % (ref 11.5–15.5)
WBC Count: 5.3 10*3/uL (ref 4.0–10.5)
nRBC: 0 % (ref 0.0–0.2)

## 2020-04-11 LAB — URINE CULTURE: Culture: >=100000 — AB

## 2020-04-11 MED ORDER — SODIUM CHLORIDE 0.9 % IV SOLN
2000.0000 mg/m2 | INTRAVENOUS | Status: DC
  Administered 2020-04-11: 3600 mg via INTRAVENOUS
  Filled 2020-04-11: qty 72

## 2020-04-11 MED ORDER — SODIUM CHLORIDE 0.9 % IV SOLN
400.0000 mg/m2 | Freq: Once | INTRAVENOUS | Status: AC
  Administered 2020-04-11: 720 mg via INTRAVENOUS
  Filled 2020-04-11: qty 36

## 2020-04-11 MED ORDER — ATROPINE SULFATE 1 MG/ML IJ SOLN
0.5000 mg | Freq: Once | INTRAMUSCULAR | Status: AC | PRN
  Administered 2020-04-11: 0.5 mg via INTRAVENOUS

## 2020-04-11 MED ORDER — DEXAMETHASONE 4 MG PO TABS: ORAL_TABLET | ORAL | 2 refills | Status: DC

## 2020-04-11 MED ORDER — PALONOSETRON HCL INJECTION 0.25 MG/5ML
0.2500 mg | Freq: Once | INTRAVENOUS | Status: AC
  Administered 2020-04-11: 0.25 mg via INTRAVENOUS

## 2020-04-11 MED ORDER — SODIUM CHLORIDE 0.9 % IV SOLN
150.0000 mg | Freq: Once | INTRAVENOUS | Status: AC
  Administered 2020-04-11: 150 mg via INTRAVENOUS
  Filled 2020-04-11: qty 150

## 2020-04-11 MED ORDER — SODIUM CHLORIDE 0.9% FLUSH
10.0000 mL | INTRAVENOUS | Status: DC | PRN
  Administered 2020-04-11: 10 mL via INTRAVENOUS
  Filled 2020-04-11: qty 10

## 2020-04-11 MED ORDER — SODIUM CHLORIDE 0.9 % IV SOLN
10.0000 mg | Freq: Once | INTRAVENOUS | Status: AC
  Administered 2020-04-11: 10 mg via INTRAVENOUS
  Filled 2020-04-11: qty 10

## 2020-04-11 MED ORDER — SODIUM CHLORIDE 0.9 % IV SOLN
150.0000 mg/m2 | Freq: Once | INTRAVENOUS | Status: AC
  Administered 2020-04-11: 280 mg via INTRAVENOUS
  Filled 2020-04-11: qty 14

## 2020-04-11 MED ORDER — OXALIPLATIN CHEMO INJECTION 100 MG/20ML: 85.0000 mg/m2 | Freq: Once | INTRAVENOUS | Status: DC

## 2020-04-11 MED ORDER — ATROPINE SULFATE 1 MG/ML IJ SOLN
INTRAMUSCULAR | Status: AC
  Filled 2020-04-11: qty 1

## 2020-04-11 MED ORDER — PALONOSETRON HCL INJECTION 0.25 MG/5ML
INTRAVENOUS | Status: AC
  Filled 2020-04-11: qty 5

## 2020-04-11 MED ORDER — DEXTROSE 5 % IV SOLN
Freq: Once | INTRAVENOUS | Status: AC
  Filled 2020-04-11: qty 250

## 2020-04-11 MED ORDER — OXALIPLATIN CHEMO INJECTION 100 MG/20ML
84.0000 mg/m2 | Freq: Once | INTRAVENOUS | Status: AC
  Administered 2020-04-11: 150 mg via INTRAVENOUS
  Filled 2020-04-11: qty 30

## 2020-04-11 NOTE — Patient Instructions (Signed)
[place] Discharge Instructions for Patients Receiving Chemotherapy  Today you received the following chemotherapy agents: Oxaliplatin, Irinotecan, leucovorin, 5FU   To help prevent nausea and vomiting after your treatment, we encourage you to take your nausea medication as directed.    If you develop nausea and vomiting that is not controlled by your nausea medication, call the clinic.   BELOW ARE SYMPTOMS THAT SHOULD BE REPORTED IMMEDIATELY:  *FEVER GREATER THAN 100.5 F  *CHILLS WITH OR WITHOUT FEVER  NAUSEA AND VOMITING THAT IS NOT CONTROLLED WITH YOUR NAUSEA MEDICATION  *UNUSUAL SHORTNESS OF BREATH  *UNUSUAL BRUISING OR BLEEDING  TENDERNESS IN MOUTH AND THROAT WITH OR WITHOUT PRESENCE OF ULCERS  *URINARY PROBLEMS  *BOWEL PROBLEMS  UNUSUAL RASH Items with * indicate a potential emergency and should be followed up as soon as possible.  Feel free to call the clinic should you have any questions or concerns. The clinic phone number is [phone].  Please show the [place] at check-in to the Emergency Department and triage nurse.

## 2020-04-11 NOTE — Progress Notes (Signed)
Per [name], patient to receive pegfilgrastim (ziextenzo per insurance pref) on pump d/c days. PA team notified and working on [place]. Treatment today not dependent on this.   Demetrius Charity, PharmD, BCPS, [facility] Pharmacist Pharmacy Phone: [phone] 04/11/2020

## 2020-04-11 NOTE — Progress Notes (Signed)
Nutrition follow-up completed with patient during infusion for pancreas cancer. Weight has decreased and was documented as 143.7 pounds on May 13.  This is decreased from 167.8 pounds December 2020. Patient reports she is eating some very small amounts of food. She has not tolerated protein shakes and she has tried 5 or 6 different kinds.  She describes them as being too thick and too sweet. She reports she has had some diarrhea but it is not huge volumes.  She denies that stools are greasy or oily in appearance. She has nausea.  Nutrition diagnosis: Unintended weight loss continues.  Intervention: Educated patient to continue strategies for small amounts of high-calorie, high-protein foods as tolerated in small amounts throughout the day. Encouraged high-protein foods. Encouraged patient to keep a journal of her bowel movements and discussed with MD at next visit if these continue to be very frequent or develop greasy oily appearance. Educated on strategies for improving taste of protein supplements. Provided sample of boost soothe. Patient was given written fact sheets and contact information.  Monitoring, evaluation, goals: Patient will tolerate adequate calories and protein to minimize weight loss.  Next visit: Follow as needed.  Patient will contact me for questions or concerns.

## 2020-04-11 NOTE — Progress Notes (Addendum)
[facility] OFFICE PROGRESS NOTE   Diagnosis: Pancreas cancer  INTERVAL HISTORY:   Ms. Eriksen returns as scheduled.  She completed cycle 1 FOLFIRINOX 03/28/2020.  She was hospitalized 03/31/2020 through 04/01/2020 with nausea/vomiting.  Symptoms improved with dexamethasone.  She has low-grade nausea.  No mouth sores.  She has 2-3 loose stools a day.  Abdominal pain is well controlled with the current pain regimen.  Objective:  Vital signs in last 24 hours:  Blood pressure 126/79, pulse 68, temperature 98.7 F (37.1 C), temperature source Temporal, resp. rate 17, height 5\' 6"  (1.676 m), weight 143 lb 11.2 oz (65.2 kg), SpO2 100 %.    HEENT: No thrush or ulcers. Lymph: No palpable left supraclavicular adenopathy. Resp: Lungs clear bilaterally. Cardio: Regular rate and rhythm. GI: Abdomen soft and nontender.  No hepatomegaly. Vascular: No leg edema.  Skin: No rash. Port-A-Cath without erythema.   Lab Results:  Lab Results  Component Value Date   WBC 5.3 04/11/2020   HGB 10.9 (L) 04/11/2020   HCT 32.8 (L) 04/11/2020   MCV 91.9 04/11/2020   PLT 324 04/11/2020   NEUTROABS 2.0 04/11/2020    Imaging:  No results found.  Medications: I have reviewed the patient's current medications.  Assessment/Plan: 1. 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 MRI abdomen 02/01/2020-2.2 x 3.3 cm mass in the posterior pancreas head/uncinate process, mild intrahepatic/extrahepatic ductal dilatation, no evidence of vascular invasion, possible small lymph nodes in the porta hepatis-poorly visualized ? EUS 02/29/2020-20 x 23 mm mass in the pancreas head, upstream pancreatic duct dilatation, 1 abnormal peripancreatic node, uT3?uN1 ? 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 CTs 03/19/2020-2.9 x 2.1 x 3.1 cm pancreas head/uncinate mass, lesion associated with infrarenal abdominal aorta and superior mesenteric artery,  prominent and borderline enlarged retroperitoneal nodes including a left periaortic node, indeterminate 4 mm lung nodule ? Cycle 1 mFOLFIRINOX [id number], complicated by n/v and hospital admission 5/2/ - 5/3 ? Second opinion at [facility] with Dr. Earnestine Mealing and Dr. Mariah Milling 04/02/20  ? PET scan 04/03/2020-hypermetabolic poorly marginated pancreatic head mass; 2 hypermetabolic left periaortic lymph nodes, hypermetabolic left supraclavicular lymph node  ? Cycle 2 FOLFIRINOX 04/11/2020  2. Abdomen/back pain secondary to #1 3. Anorexia/weight loss secondary to #1 4. History of coronary artery disease,STEMI with the fib arrest 2011, status post RCA stent 5. Constipation, secondary to #1 and morphine  6.   Hospital admission for n/v after cycle 1 FOLFIRINOX 5/2 - 5/3 --prophylactic dexamethasone beginning day 2  Disposition: Ms. Kotlarz appears stable.  She has completed 1 cycle of FOLFIRINOX.  Postchemotherapy course complicated by delayed nausea/vomiting.  She was hospitalized.  Symptoms improved with dexamethasone.  Plan to proceed with cycle 2 FOLFIRINOX today as scheduled.  She will take dexamethasone 8 mg twice daily for 2 days beginning 04/12/2020.  Prescription was sent to her pharmacy.  He understands to contact the office if she has nausea despite prophylactic dexamethasone.  We reviewed the CBC from today.  Counts adequate to proceed with treatment.  The absolute neutrophil count is at the low end of the normal range.  Plan for white cell growth factor support on day of pump discontinuation.  Potential toxicities reviewed including bone pain, rash, rare splenic rupture.  She agrees to proceed.  Dr. Benay Spice reviewed the recent PET scan results/images with Ms. Bohan and her husband at today's visit.  She is scheduled for a biopsy of the hypermetabolic left supraclavicular lymph  node next week.  She will return for lab, follow-up, cycle 3 FOLFIRINOX in 2 weeks.  She will contact the office in the interim  with any problems.  Patient seen with Dr. Benay Spice.    [name] ANP/GNP-BC   04/11/2020  10:21 AM This was a shared visit with [name].  Ms. Hugh appears well today.  She is completing treatment with ciprofloxacin for urinary tract infection.  She will complete cycle 2 FOLFIRINOX today.  We adjusted the antiemetic regimen and added G-CSF support.  We reviewed the PET images with Ms. Godinho and her husband.  The PET scan is consistent with metastatic disease involving a left supraclavicular node and retroperitoneal lymph nodes.  She will be referred for a supraclavicular lymph node biopsy to confirm metastatic disease.  I discussed the case with Dr.Abbruzzese.  Julieanne Manson, MD

## 2020-04-12 NOTE — Progress Notes (Signed)
The patient was seen in the infusion room today after they had received her chemotherapy with oxaliplatin, irinotecan, leucovorin, and 5-FU.  She reported that she was having the sensation that her tongue was thick and that she was having some difficulty speaking.  She also had had abdominal discomfort and had been given atropine after which she experienced some dizziness.  The patient was reassured that her speech changes were likely related to her dosing with oxaliplatin.  These issues resolved without any other intervention.  [name], MHS, PA-C Physician Assistant

## 2020-04-13 ENCOUNTER — Other Ambulatory Visit: Payer: Self-pay

## 2020-04-13 ENCOUNTER — Inpatient Hospital Stay: Payer: 59

## 2020-04-13 VITALS — BP 115/76 | HR 92 | Temp 98.7°F | Resp 18

## 2020-04-13 DIAGNOSIS — C250 Malignant neoplasm of head of pancreas: Secondary | ICD-10-CM

## 2020-04-13 DIAGNOSIS — Z5111 Encounter for antineoplastic chemotherapy: Secondary | ICD-10-CM | POA: Diagnosis not present

## 2020-04-13 MED ORDER — SODIUM CHLORIDE 0.9% FLUSH
10.0000 mL | INTRAVENOUS | Status: DC | PRN
  Administered 2020-04-13: 10 mL
  Filled 2020-04-13: qty 10

## 2020-04-13 MED ORDER — PEGFILGRASTIM-BMEZ 6 MG/0.6ML ~~LOC~~ SOSY
6.0000 mg | PREFILLED_SYRINGE | Freq: Once | SUBCUTANEOUS | Status: AC
  Administered 2020-04-13: 6 mg via SUBCUTANEOUS

## 2020-04-13 MED ORDER — HEPARIN SOD (PORK) LOCK FLUSH 100 UNIT/ML IV SOLN
500.0000 [IU] | Freq: Once | INTRAVENOUS | Status: AC | PRN
  Administered 2020-04-13: 500 [IU]
  Filled 2020-04-13: qty 5

## 2020-04-13 NOTE — Patient Instructions (Signed)

## 2020-04-15 ENCOUNTER — Telehealth: Payer: Self-pay | Admitting: Emergency Medicine

## 2020-04-15 NOTE — Telephone Encounter (Signed)
Incoming call on triage line from pt reporting nausea/gagging this am, requesting advisement on.  Pt advised to start with compazine, alternating with zofran (both taken as prescribed), for nausea.  Pt advised to try using ativan sublingually if nausea continues or if medication cannot be kept down.  Pt verbalized understanding of instructions and to call back with any questions/concerns or if symptoms continue.

## 2020-04-16 ENCOUNTER — Other Ambulatory Visit: Payer: Self-pay | Admitting: Radiology

## 2020-04-18 ENCOUNTER — Ambulatory Visit (HOSPITAL_COMMUNITY): Payer: 59

## 2020-04-18 ENCOUNTER — Ambulatory Visit (HOSPITAL_COMMUNITY)
Admission: RE | Admit: 2020-04-18 | Discharge: 2020-04-18 | Disposition: A | Discharge status: Home / Self Care | Payer: 59 | Source: Ambulatory Visit | Attending: Urology | Admitting: Urology

## 2020-04-18 ENCOUNTER — Other Ambulatory Visit: Payer: Self-pay

## 2020-04-18 ENCOUNTER — Ambulatory Visit (HOSPITAL_COMMUNITY)
Admission: RE | Admit: 2020-04-18 | Discharge: 2020-04-18 | Disposition: A | Discharge status: Home / Self Care | Payer: 59 | Source: Ambulatory Visit | Attending: Nurse Practitioner | Admitting: Nurse Practitioner

## 2020-04-18 ENCOUNTER — Encounter (HOSPITAL_COMMUNITY): Payer: Self-pay

## 2020-04-18 DIAGNOSIS — C250 Malignant neoplasm of head of pancreas: Secondary | ICD-10-CM | POA: Insufficient documentation

## 2020-04-18 DIAGNOSIS — Z452 Encounter for adjustment and management of vascular access device: Secondary | ICD-10-CM | POA: Diagnosis not present

## 2020-04-18 DIAGNOSIS — I252 Old myocardial infarction: Secondary | ICD-10-CM | POA: Diagnosis not present

## 2020-04-18 DIAGNOSIS — R591 Generalized enlarged lymph nodes: Secondary | ICD-10-CM | POA: Diagnosis present

## 2020-04-18 DIAGNOSIS — Z87891 Personal history of nicotine dependence: Secondary | ICD-10-CM | POA: Diagnosis not present

## 2020-04-18 DIAGNOSIS — I10 Essential (primary) hypertension: Secondary | ICD-10-CM | POA: Diagnosis not present

## 2020-04-18 DIAGNOSIS — Z79899 Other long term (current) drug therapy: Secondary | ICD-10-CM | POA: Insufficient documentation

## 2020-04-18 DIAGNOSIS — I2510 Atherosclerotic heart disease of native coronary artery without angina pectoris: Secondary | ICD-10-CM | POA: Insufficient documentation

## 2020-04-18 DIAGNOSIS — E785 Hyperlipidemia, unspecified: Secondary | ICD-10-CM | POA: Insufficient documentation

## 2020-04-18 DIAGNOSIS — Z7982 Long term (current) use of aspirin: Secondary | ICD-10-CM | POA: Insufficient documentation

## 2020-04-18 DIAGNOSIS — Z7902 Long term (current) use of antithrombotics/antiplatelets: Secondary | ICD-10-CM | POA: Insufficient documentation

## 2020-04-18 DIAGNOSIS — Z8507 Personal history of malignant neoplasm of pancreas: Secondary | ICD-10-CM | POA: Insufficient documentation

## 2020-04-18 DIAGNOSIS — Z7901 Long term (current) use of anticoagulants: Secondary | ICD-10-CM | POA: Insufficient documentation

## 2020-04-18 LAB — CBC WITH DIFFERENTIAL/PLATELET
Abs Immature Granulocytes: 0.22 10*3/uL — ABNORMAL HIGH (ref 0.00–0.07)
Basophils Absolute: 0.1 10*3/uL (ref 0.0–0.1)
Basophils Relative: 1 %
Eosinophils Absolute: 1.2 10*3/uL — ABNORMAL HIGH (ref 0.0–0.5)
Eosinophils Relative: 7 %
HCT: 33 % — ABNORMAL LOW (ref 36.0–46.0)
Hemoglobin: 11 g/dL — ABNORMAL LOW (ref 12.0–15.0)
Immature Granulocytes: 1 %
Lymphocytes Relative: 17 %
Lymphs Abs: 2.9 10*3/uL (ref 0.7–4.0)
MCH: 30.7 pg (ref 26.0–34.0)
MCHC: 33.3 g/dL (ref 30.0–36.0)
MCV: 92.2 fL (ref 80.0–100.0)
Monocytes Absolute: 1.4 10*3/uL — ABNORMAL HIGH (ref 0.1–1.0)
Monocytes Relative: 8 %
Neutro Abs: 11.3 10*3/uL — ABNORMAL HIGH (ref 1.7–7.7)
Neutrophils Relative %: 66 %
Platelets: 185 10*3/uL (ref 150–400)
RBC: 3.58 MIL/uL — ABNORMAL LOW (ref 3.87–5.11)
RDW: 13.3 % (ref 11.5–15.5)
WBC: 17.2 10*3/uL — ABNORMAL HIGH (ref 4.0–10.5)
nRBC: 0 % (ref 0.0–0.2)

## 2020-04-18 LAB — PROTIME-INR
INR: 1.1 (ref 0.8–1.2)
Prothrombin Time: 13.4 seconds (ref 11.4–15.2)

## 2020-04-18 MED ORDER — LIDOCAINE HCL 1 % IJ SOLN
INTRAMUSCULAR | Status: AC
  Filled 2020-04-18: qty 20

## 2020-04-18 MED ORDER — MIDAZOLAM HCL 2 MG/2ML IJ SOLN
INTRAMUSCULAR | Status: AC | PRN
  Administered 2020-04-18: 1 mg via INTRAVENOUS

## 2020-04-18 MED ORDER — FENTANYL CITRATE (PF) 100 MCG/2ML IJ SOLN
INTRAMUSCULAR | Status: AC
  Filled 2020-04-18: qty 2

## 2020-04-18 MED ORDER — MIDAZOLAM HCL 2 MG/2ML IJ SOLN
INTRAMUSCULAR | Status: AC
  Filled 2020-04-18: qty 2

## 2020-04-18 MED ORDER — SODIUM CHLORIDE 0.9 % IV SOLN: INTRAVENOUS | Status: DC

## 2020-04-18 MED ORDER — HEPARIN SOD (PORK) LOCK FLUSH 100 UNIT/ML IV SOLN
500.0000 [IU] | INTRAVENOUS | Status: AC | PRN
  Administered 2020-04-18: 500 [IU]
  Filled 2020-04-18: qty 5

## 2020-04-18 MED ORDER — FENTANYL CITRATE (PF) 100 MCG/2ML IJ SOLN
INTRAMUSCULAR | Status: AC | PRN
  Administered 2020-04-18: 50 ug via INTRAVENOUS

## 2020-04-18 MED ORDER — LIDOCAINE HCL (PF) 1 % IJ SOLN
INTRAMUSCULAR | Status: AC | PRN
  Administered 2020-04-18: 5 mL via INTRADERMAL

## 2020-04-18 NOTE — Procedures (Signed)
Interventional Radiology Procedure Note  Procedure: Placement of a right IJ approach single lumen PowerPort.  Tip is positioned at the superior cavoatrial junction and catheter is ready for immediate use.  Complications: No immediate Recommendations:  - Ok to shower tomorrow - Do not submerge for 7 days - Routine line care   Signed,  Wilba Mutz K. Syair Fricker, MD   

## 2020-04-18 NOTE — H&P (Signed)
Chief Complaint: Patient was seen in consultation today for left supraclavicular lymphadenopathy/biopsy.  Referring Physician(s): Greenhorn K  Supervising Physician: Jacqulynn Cadet  Patient Status: [facility] - Out-pt  History of Present Illness: Vicki [name] is a 64 y.o. female with a past medical history of hypertension, hyperlipidemia, MI 2011, CAD s/p RCA stent 2011, and pancreatic cancer. She was unfortunately diagnosed with pancreatic cancer in 02/2020. Her cancer is managed by Dr. Benay Spice, [name], NP, and Cira Rue, NP. She underwent ERCP with uncovered metal stent placement 03/05/2020. She had a Port-a-cath placed in IR 03/26/2020 by Dr. Kathlene Cote. She is currently undergoing systemic chemotherapy for management. Recent NM PET (staging) revealed hypermetabolic lymphadenopathy (left para-aortic and left supraclavicular).  NM PET 04/03/2020: 1. Hypermetabolic poorly marginated pancreatic head mass compatible with known pancreatic malignancy. CBD stent in place. 2. Hypermetabolic left para-aortic and left supraclavicular nodal metastases. 3. Superior segment left lower lobe 4 mm solid pulmonary nodule, below PET resolution, warranting attention on future follow-up chest CT. 4. Chronic findings include: Aortic Atherosclerosis (ICD10-I70.0). Coronary atherosclerosis. Mild sigmoid diverticulosis.  IR requested by Cira Rue, NP for possible image-guided left supraclavicular lymph node biopsy to confirm metastasis. Patient awake and alert sitting in bed with no complaints at this time. Denies fever, chills, chest pain, dyspnea, abdominal pain, or headache.   Past Medical History:  Diagnosis Date  . CAD (coronary artery disease) 2011   with stent to RCA and 60 % LAD disease  . Clotting disorder ([place])   . Family history of breast cancer   . Family history of cystic fibrosis   . H/O cardiac arrest 02/15/10   with STEMI- Inf wall  . Hyperlipidemia   . Hypertension   .  Myocardial infarct ([place]) 02/15/10  . pancreatic ca dx'd 02/2020  . Shock, cardiogenic ([place]) 01/2010   with MI, IABP    Past Surgical History:  Procedure Laterality Date  . ABDOMINAL HYSTERECTOMY  1987   BSO  . APPENDECTOMY  1987  . BILIARY STENT PLACEMENT N/A 03/05/2020   Procedure: BILIARY STENT PLACEMENT;  Surgeon: Clarene Essex, MD;  Location: [facility];  Service: Endoscopy;  Laterality: N/A;  . CORONARY ANGIOPLASTY WITH STENT PLACEMENT  02/15/10   Stent to prox RCA -BMS  . DOPPLER ECHOCARDIOGRAPHY  05/06/2010   EF =50-55%  lvfx low normal ;no sigificant valvular disease seen  . ENDOSCOPIC RETROGRADE CHOLANGIOPANCREATOGRAPHY (ERCP) WITH PROPOFOL N/A 02/29/2020   Procedure: ENDOSCOPIC RETROGRADE CHOLANGIOPANCREATOGRAPHY (ERCP) WITH PROPOFOL;  Surgeon: Arta Silence, MD;  Location: [facility];  Service: Endoscopy;  Laterality: N/A;  . ERCP N/A 03/05/2020   Procedure: ENDOSCOPIC RETROGRADE CHOLANGIOPANCREATOGRAPHY (ERCP);  Surgeon: Clarene Essex, MD;  Location: [facility];  Service: Endoscopy;  Laterality: N/A;  with stent placement  . ESOPHAGOGASTRODUODENOSCOPY (EGD) WITH PROPOFOL N/A 02/29/2020   Procedure: ESOPHAGOGASTRODUODENOSCOPY (EGD) WITH PROPOFOL;  Surgeon: Arta Silence, MD;  Location: [facility];  Service: Endoscopy;  Laterality: N/A;  . EXPLORATORY LAPAROTOMY    . FINE NEEDLE ASPIRATION  02/29/2020   Procedure: FINE NEEDLE ASPIRATION;  Surgeon: Arta Silence, MD;  Location: [facility];  Service: Endoscopy;;  . HERNIA REPAIR  1980   double bilateral  . IR IMAGING GUIDED PORT INSERTION  03/26/2020  . MASS EXCISION  09/02/2012   Procedure: EXCISION MASS;  Surgeon: Earnstine Regal, MD;  Location: [place];  Service: General;  Laterality: Left;  Excise mass Left posterior neck  . NM MYOCAR PERF WALL MOTION  05/06/2010   exercise cap 8 mets. ,  mild perfusion defect in basal infer.,mid infer., apical infer., region consistent with infarct/scar. no significant ischemia    . PANCREATIC STENT PLACEMENT  02/29/2020   Procedure: PANCREATIC STENT PLACEMENT;  Surgeon: Arta Silence, MD;  Location: [facility];  Service: Endoscopy;;  . right chest wall exploration    . SPHINCTEROTOMY  02/29/2020   Procedure: SPHINCTEROTOMY;  Surgeon: Arta Silence, MD;  Location: [facility];  Service: Endoscopy;;  . [name]  03/05/2020   Procedure: [name];  Surgeon: Clarene Essex, MD;  Location: [facility];  Service: Endoscopy;;  . UPPER ESOPHAGEAL ENDOSCOPIC ULTRASOUND (EUS) N/A 02/29/2020   Procedure: UPPER ESOPHAGEAL ENDOSCOPIC ULTRASOUND (EUS)  LINEAR SCOPE;  Surgeon: Arta Silence, MD;  Location: [facility];  Service: Endoscopy;  Laterality: N/A;  DR. NEEDS TWO HOURS FOR PROCEDURE    Allergies: Chlorhexidine  Medications: Prior to Admission medications   Medication Sig Start Date End Date Taking? Authorizing Provider  ALPRAZolam (XANAX) 0.25 MG tablet Take 1 tablet (0.25 mg total) by mouth at bedtime. 04/09/20   Midge Minium, MD  aspirin 81 MG tablet Take 81 mg by mouth at bedtime.     [provider]  budesonide (ENTOCORT EC) 3 MG 24 hr capsule Take 3 mg by mouth daily.  01/26/18   [provider]  cetirizine (ZYRTEC) 10 MG tablet Take 10 mg by mouth at bedtime.     [provider]  Cholecalciferol (VITAMIN D3) 5000 units CAPS Take 5,000 Units by mouth at bedtime.     [provider]  ciprofloxacin (CIPRO) 500 MG tablet Take 1 tablet (500 mg total) by mouth 2 (two) times daily. 04/09/20   Ladell Pier, MD  clopidogrel (PLAVIX) 75 MG tablet Take 75 mg by mouth daily.    [provider]  dexamethasone (DECADRON) 4 MG tablet Take 2 tabs twice a day for 2 days beginning day after chemotherapy 04/11/20   Owens Shark, NP  escitalopram (LEXAPRO) 10 MG tablet Take 1 tablet (10 mg total) by mouth daily. 04/04/20   Midge Minium, MD  lidocaine-prilocaine (EMLA) cream Apply 1 application topically as  directed. Apply to port site 1 hour prior to stick and cover with plastic wrap 03/25/20   Ladell Pier, MD  lisinopril (ZESTRIL) 5 MG tablet Take 1 tablet (5 mg total) by mouth daily. 11/29/19   Kroeger, Lorelee Cover., PA-C  metoprolol tartrate (LOPRESSOR) 50 MG tablet TAKE 1 TABLET(50 MG) BY MOUTH TWICE DAILY Patient taking differently: Take 25 mg by mouth 2 (two) times daily.  02/20/19   Lorretta Harp, MD  montelukast (SINGULAIR) 10 MG tablet TAKE 1 TABLET(10 MG) BY MOUTH AT BEDTIME Patient taking differently: Take 10 mg by mouth at bedtime.  11/02/19   Midge Minium, MD  morphine (MS CONTIN) 15 MG 12 hr tablet Take 1 tablet (15 mg total) by mouth every 12 (twelve) hours. 04/08/20   Owens Shark, NP  nitroGLYCERIN (NITROSTAT) 0.4 MG SL tablet PLACE 1 TABLET UNDER THE TONGUE EVERY 5 MINUTES AS NEEDED FOR CHEST PAIN 11/29/19   Lorretta Harp, MD  Omega-3 1000 MG CAPS Take 1,000 mg by mouth at bedtime.    [provider]  ondansetron (ZOFRAN) 8 MG tablet Take 1 tablet (8 mg total) by mouth every 8 (eight) hours as needed for nausea or vomiting. 03/12/20   Ladell Pier, MD  oxyCODONE-acetaminophen (PERCOCET) 10-325 MG tablet Take 1 tablet by mouth every 4 (four) hours as needed for pain. 04/09/20  Alla Feeling, NP  pantoprazole (PROTONIX) 40 MG tablet Take 1 tablet (40 mg total) by mouth 2 (two) times daily. 11/30/19   Lorretta Harp, MD  polyethylene glycol (MIRALAX / GLYCOLAX) 17 g packet Take 17 g by mouth daily as needed for mild constipation or moderate constipation.     [provider]  potassium chloride SA (KLOR-CON) 20 MEQ tablet TAKE 1 TABLET(20 MEQ) BY MOUTH DAILY Patient taking differently: Take 20 mEq by mouth daily.  03/15/20   Lorretta Harp, MD  prochlorperazine (COMPAZINE) 10 MG tablet Take 1 tablet (10 mg total) by mouth every 6 (six) hours as needed for nausea. 03/25/20   Ladell Pier, MD  rosuvastatin (CRESTOR) 10 MG tablet TAKE 1 TABLET(10  MG) BY MOUTH DAILY Patient taking differently: Take 10 mg by mouth daily.  01/03/20   Lorretta Harp, MD     Family History  Problem Relation Age of Onset  . Breast cancer Mother 14  . Dementia Mother   . Cystic fibrosis Father 43       late onset dx  . Healthy Brother   . Dementia Maternal Aunt   . Dementia Maternal Uncle   . Other Paternal Uncle        MVA while in the [employer]  . Arthritis Maternal Grandmother   . AAA (abdominal aortic aneurysm) Maternal Grandfather   . Other Paternal 49        old age  . Heart attack Paternal Grandfather   . Liver disease Paternal Uncle   . Pancreatitis Son        several bouts of pancreatitis  . Breast cancer Cousin 54       mat first cousin  . Colon cancer Neg Hx     Social History   Socioeconomic History  . Marital status: Married    Spouse name: Not on file  . Number of children: 2  . Years of education: Not on file  . Highest education level: Not on file  Occupational History  . 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 Smoking status: Former Smoker    Quit date: 01/28/2010    Years since quitting: 10.2  . Smokeless tobacco: Never Used  Substance and Sexual Activity  . Alcohol use: Yes    Alcohol/week: 1.0 - 2.0 standard drinks    Types: 1 - 2 Glasses of wine per week  . Drug use: No  . Sexual activity: Not on file  Other Topics Concern  . Not on file  Social History Narrative  . Not on file   Social Determinants of Health   Financial Resource Strain:   . Difficulty of Paying Living Expenses:   Food Insecurity:   . Worried About Charity fundraiser in the Last Year:   . Arboriculturist in the Last Year:   Transportation Needs:   . Film/video editor (Medical):   Marland Kitchen Lack of Transportation (Non-Medical):   Physical Activity:   . Days of Exercise per Week:   . Minutes of Exercise per Session:   Stress:   . Feeling of Stress :   Social Connections:   . Frequency of Communication  with Friends and Family:   . Frequency of Social Gatherings with Friends and Family:   . Attends Religious Services:   . Active Member of Clubs or Organizations:   . Attends Archivist Meetings:   Marland Kitchen Marital Status:  Review of Systems: A 12 point ROS discussed and pertinent positives are indicated in the HPI above.  All other systems are negative.  Review of Systems  Constitutional: Negative for chills and fever.  Respiratory: Negative for shortness of breath and wheezing.   Cardiovascular: Negative for chest pain and palpitations.  Gastrointestinal: Negative for abdominal pain.  Neurological: Negative for headaches.  Psychiatric/Behavioral: Negative for behavioral problems and confusion.    Vital Signs: BP 123/77 (BP Location: Right Arm)   Pulse 73   Temp 98.7 F (37.1 C) (Oral)   Resp 18   SpO2 100%   Physical Exam Vitals and nursing note reviewed.  Constitutional:      General: She is not in acute distress.    Appearance: Normal appearance.  Cardiovascular:     Rate and Rhythm: Normal rate and regular rhythm.     Heart sounds: Normal heart sounds. No murmur.  Pulmonary:     Effort: Pulmonary effort is normal. No respiratory distress.     Breath sounds: Normal breath sounds. No wheezing.  Skin:    General: Skin is warm and dry.  Neurological:     Mental Status: She is alert and oriented to person, place, and time.      MD Evaluation Airway: WNL Heart: WNL Abdomen: WNL Chest/ Lungs: WNL ASA  Classification: 3 Mallampati/Airway Score: Two   Imaging: NM PET Image Initial (PI) Skull Base To Thigh  Result Date: 04/03/2020 CLINICAL DATA:  Initial treatment strategy for malignant neoplasm of pancreas. Evaluate retroperitoneal nodes.   Left lower lobe pulmonary nodule. Primary Cancer Type: Pancreas Interval therapy since last imaging? Yes Initial Cancer Diagnosis Date: 02/29/2020    Established by: Biopsy-proven Detailed Pathology: Adenocarcinoma,  T3N1. Primary Tumor location: Posterior pancreas head/uncinate process. Chemotherapy: Yes Ongoing? Yes Most recent administration: 03/28/2020 EXAM: NUCLEAR MEDICINE PET SKULL BASE TO THIGH TECHNIQUE: 7.1 mCi F-18 FDG was injected intravenously. Full-ring PET imaging was performed from the skull base to thigh after the radiotracer. CT data was obtained and used for attenuation correction and anatomic localization. Fasting blood glucose: 97 mg/dl COMPARISON:  Most recent CT chest, abdomen, and pelvis 03/19/2020. FINDINGS: Mediastinal blood pool activity: SUV max 2.5 Liver activity: SUV max NA NECK: Enlarged poorly marginated hypermetabolic 1.6 cm left supraclavicular lymph node with max SUV 6.0 (series 4/image 47). No additional enlarged or hypermetabolic lymph nodes in the neck. Incidental CT findings: Right internal jugular Port-A-Cath terminates at the cavoatrial junction. CHEST: No enlarged or hypermetabolic axillary, mediastinal or hilar lymph nodes. No hypermetabolic pulmonary findings. Incidental CT findings: Coronary atherosclerosis. Atherosclerotic nonaneurysmal thoracic aorta. No acute consolidative airspace disease or lung masses. Superior segment left lower lobe 4 mm solid pulmonary nodule (series 4/image 62), below PET resolution. ABDOMEN/PELVIS: Hypermetabolic poorly marginated pancreatic head mass measuring approximately 3.1 x 2.3 cm with max SUV 5.4 (series 4/image 115). Two hypermetabolic left para-aortic lymph nodes, largest 1.0 cm with max SUV 6.1 (series 4/image 123). No additional hypermetabolic abdominopelvic lymph nodes. No abnormal hypermetabolic activity within the liver, adrenal glands, or spleen. Incidental CT findings: CBD stent in place with pneumobilia in the intrahepatic bile ducts. Stable main pancreatic duct dilation (5 mm diameter). Atherosclerotic nonaneurysmal abdominal aorta. Mild sigmoid diverticulosis. Hysterectomy. SKELETON: No focal hypermetabolic activity to suggest skeletal  metastasis. Incidental CT findings: none IMPRESSION: 1. Hypermetabolic poorly marginated pancreatic head mass compatible with known pancreatic malignancy. CBD stent in place. 2. Hypermetabolic left para-aortic and left supraclavicular nodal metastases. 3. Superior segment left lower lobe 4 mm  solid pulmonary nodule, below PET resolution, warranting attention on future follow-up chest CT. 4. Chronic findings include: Aortic Atherosclerosis (ICD10-I70.0). Coronary atherosclerosis. Mild sigmoid diverticulosis. Electronically Signed   By: Ilona Sorrel M.D.   On: 04/03/2020 10:59   DG Abd 2 Views  Result Date: 04/01/2020 CLINICAL DATA:  Bowel obstruction EXAM: ABDOMEN - 2 VIEW COMPARISON:  Abdominal CT 03/19/2020 FINDINGS: Formed stool throughout the distal colon. No evidence of small bowel obstruction. No evidence of pneumoperitoneum. Metallic CBD stent. Clear lung bases. IMPRESSION: 1. Nonobstructive bowel gas pattern. 2. Stool throughout the distal colon. Electronically Signed   By: Monte Fantasia M.D.   On: 04/01/2020 05:35   IR IMAGING GUIDED PORT INSERTION  Result Date: 03/26/2020 CLINICAL DATA:  Pancreatic carcinoma and need for porta cath to begin chemotherapy. EXAM: IMPLANTED PORT A CATH PLACEMENT WITH ULTRASOUND AND FLUOROSCOPIC GUIDANCE ANESTHESIA/SEDATION: 1.5 mg IV Versed; 75 mcg IV Fentanyl Total Moderate Sedation Time:  30 minutes The patient's level of consciousness and physiologic status were continuously monitored during the procedure by Radiology nursing. Additional Medications: 2 g IV Ancef. FLUOROSCOPY TIME:  24 seconds.  1.4 mGy. PROCEDURE: The procedure, risks, benefits, and alternatives were explained to the patient. Questions regarding the procedure were encouraged and answered. The patient understands and consents to the procedure. A time-out was performed prior to initiating the procedure. Ultrasound was utilized to confirm patency of the right internal jugular vein. The right neck and  chest were prepped with chlorhexidine in a sterile fashion, and a sterile drape was applied covering the operative field. Maximum barrier sterile technique with sterile gowns and gloves were used for the procedure. Local anesthesia was provided with 1% lidocaine. After creating a small venotomy incision, a 21 gauge needle was advanced into the right internal jugular vein under direct, real-time ultrasound guidance. Ultrasound image documentation was performed. After securing guidewire access, an 8 Fr dilator was placed. A J-wire was kinked to measure appropriate catheter length. A subcutaneous port pocket was then created along the upper chest wall utilizing sharp and blunt dissection. Portable cautery was utilized. The pocket was irrigated with sterile saline. A single lumen power injectable port was chosen for placement. The 8 Fr catheter was tunneled from the port pocket site to the venotomy incision. The port was placed in the pocket. External catheter was trimmed to appropriate length based on guidewire measurement. At the venotomy, an 8 Fr peel-away sheath was placed over a guidewire. The catheter was then placed through the sheath and the sheath removed. Final catheter positioning was confirmed and documented with a fluoroscopic spot image. The port was accessed with a needle and aspirated and flushed with heparinized saline. The access needle was removed. The venotomy and port pocket incisions were closed with subcutaneous 3-0 Monocryl and subcuticular 4-0 Vicryl. Dermabond was applied to both incisions. COMPLICATIONS: COMPLICATIONS None FINDINGS: After catheter placement, the tip lies at the cavo-atrial junction. The catheter aspirates normally and is ready for immediate use. IMPRESSION: Placement of single lumen port a cath via right internal jugular vein. The catheter tip lies at the cavo-atrial junction. A power injectable port a cath was placed and is ready for immediate use. Electronically Signed   By:  Aletta Edouard M.D.   On: 03/26/2020 15:50    Labs:  CBC: Recent Labs    03/12/20 1525 03/31/20 2343 04/01/20 0619 04/11/20 0934  WBC 13.0* 6.1 6.2 5.3  HGB 11.5* 11.3* 10.7* 10.9*  HCT 35.6* 33.4* 32.2* 32.8*  PLT 451* 225  209 324    COAGS: No results for input(s): INR, APTT in the last 8760 hours.  BMP: Recent Labs    03/28/20 1045 03/31/20 2343 04/01/20 0619 04/11/20 0934  NA 139 137 137 138  K 4.5 3.4* 3.4* 3.8  CL 108 104 105 108  CO2 23 24 23 22   GLUCOSE 113* 99 97 103*  BUN 10 15 12 14   CALCIUM 8.7* 8.8* 8.2* 8.8*  CREATININE 0.68 0.69 0.65 0.67  GFRNONAA >60 >60 >60 >60  GFRAA >60 >60 >60 >60    LIVER FUNCTION TESTS: Recent Labs    03/28/20 1045 03/31/20 2343 04/01/20 0619 04/11/20 0934  BILITOT 0.7 1.4* 0.7 0.5  AST 18 29 26 18   ALT 15 23 22 19   ALKPHOS 152* 111 100 106  PROT 6.2* 6.3* 5.7* 6.4*  ALBUMIN 3.1* 3.4* 3.1* 3.3*     Assessment and Plan:  Left supraclavicular lymphadenopathy, hypermetabolic on NM PET [id number], in setting of pancreatic cancer. Plan for image-guided left supraclavicular lymph node biopsy today in IR. Patient is NPO. Afebrile.  Risks and benefits discussed with the patient including, but not limited to bleeding, infection, damage to adjacent structures or low yield requiring additional tests. All of the patient's questions were answered, patient is agreeable to proceed. Consent signed and in chart.   Thank you for this interesting consult.  I greatly enjoyed meeting GERARD LALONE and look forward to participating in their care.  A copy of this report was sent to the requesting provider on this date.  Electronically Signed: Earley Abide, PA-C 04/18/2020, 12:02 PM   I spent a total of 25 Minutes in face to face in clinical consultation, greater than 50% of which was counseling/coordinating care for left supraclavicular lymphadenopathy/biopsy.

## 2020-04-18 NOTE — Progress Notes (Signed)
Pharmacist Chemotherapy Monitoring - Follow Up Assessment    I verify that I have reviewed each item in the below checklist:  . Regimen for the patient is scheduled for the appropriate day and plan matches scheduled date. Marland Kitchen Appropriate non-routine labs are ordered dependent on drug ordered. . If applicable, additional medications reviewed and ordered per protocol based on lifetime cumulative doses and/or treatment regimen.   Plan for follow-up and/or issues identified: No . I-vent associated with next due treatment: No . MD and/or nursing notified: No  Lada Fulbright D 04/18/2020 9:27 AM

## 2020-04-18 NOTE — Discharge Instructions (Signed)
Please call Interventional Radiology clinic [phone] with any questions or concerns.  You may remove your dressing and shower tomorrow.   Moderate Conscious Sedation, Adult, Care After These instructions provide you with information about caring for yourself after your procedure. Your health care provider may also give you more specific instructions. Your treatment has been planned according to current medical practices, but problems sometimes occur. Call your health care provider if you have any problems or questions after your procedure. What can I expect after the procedure? After your procedure, it is common:  To feel sleepy for several hours.  To feel clumsy and have poor balance for several hours.  To have poor judgment for several hours.  To vomit if you eat too soon. Follow these instructions at home: For at least 24 hours after the procedure:   Do not: ? Participate in activities where you could fall or become injured. ? Drive. ? Use heavy machinery. ? Drink alcohol. ? Take sleeping pills or medicines that cause drowsiness. ? Make important decisions or sign legal documents. ? Take care of children on your own.  Rest. Eating and drinking  Follow the diet recommended by your health care provider.  If you vomit: ? Drink water, juice, or soup when you can drink without vomiting. ? Make sure you have little or no nausea before eating solid foods. General instructions  Have a responsible adult stay with you until you are awake and alert.  Take over-the-counter and prescription medicines only as told by your health care provider.  If you smoke, do not smoke without supervision.  Keep all follow-up visits as told by your health care provider. This is important. Contact a health care provider if:  You keep feeling nauseous or you keep vomiting.  You feel light-headed.  You develop a rash.  You have a fever. Get help right away if:  You have trouble  breathing. This information is not intended to replace advice given to you by your health care provider. Make sure you discuss any questions you have with your health care provider. Document Revised: 10/29/2017 Document Reviewed: 03/07/2016 Elsevier Patient Education  2020 Elsevier Inc.   Needle Biopsy, Care After These instructions tell you how to care for yourself after your procedure. Your doctor may also give you more specific instructions. Call your doctor if you have any problems or questions. What can I expect after the procedure? After the procedure, it is common to have:  Soreness.  Bruising.  Mild pain. Follow these instructions at home:   Return to your normal activities as told by your doctor. Ask your doctor what activities are safe for you.  Take over-the-counter and prescription medicines only as told by your doctor.  Wash your hands with soap and water before you change your bandage (dressing). If you cannot use soap and water, use hand sanitizer.  Follow instructions from your doctor about: ? How to take care of your puncture site. ? When and how to change your bandage. ? When to remove your bandage.  Check your puncture site every day for signs of infection. Watch for: ? Redness, swelling, or pain. ? Fluid or blood. ? Pus or a bad smell. ? Warmth.  Do not take baths, swim, or use a hot tub until your doctor approves. Ask your doctor if you may take showers. You may only be allowed to take sponge baths.  Keep all follow-up visits as told by your doctor. This is important. Contact a doctor if   you have:  A fever.  Redness, swelling, or pain at the puncture site, and it lasts longer than a few days.  Fluid, blood, or pus coming from the puncture site.  Warmth coming from the puncture site. Get help right away if:  You have a lot of bleeding from the puncture site. Summary  After the procedure, it is common to have soreness, bruising, or mild pain  at the puncture site.  Check your puncture site every day for signs of infection, such as redness, swelling, or pain.  Get help right away if you have severe bleeding from your puncture site. This information is not intended to replace advice given to you by your health care provider. Make sure you discuss any questions you have with your health care provider. Document Revised: 11/29/2017 Document Reviewed: 11/29/2017 Elsevier Patient Education  2020 Elsevier Inc.  

## 2020-04-19 ENCOUNTER — Other Ambulatory Visit: Payer: Self-pay

## 2020-04-21 ENCOUNTER — Other Ambulatory Visit: Payer: Self-pay | Admitting: Oncology

## 2020-04-22 LAB — SURGICAL PATHOLOGY

## 2020-04-24 ENCOUNTER — Inpatient Hospital Stay (HOSPITAL_BASED_OUTPATIENT_CLINIC_OR_DEPARTMENT_OTHER): Payer: 59 | Admitting: Oncology

## 2020-04-24 ENCOUNTER — Telehealth: Payer: Self-pay | Admitting: Oncology

## 2020-04-24 ENCOUNTER — Inpatient Hospital Stay: Payer: 59

## 2020-04-24 ENCOUNTER — Other Ambulatory Visit: Payer: Self-pay

## 2020-04-24 VITALS — BP 109/51 | HR 65 | Temp 98.0°F | Resp 16 | Wt 141.6 lb

## 2020-04-24 DIAGNOSIS — C250 Malignant neoplasm of head of pancreas: Secondary | ICD-10-CM

## 2020-04-24 DIAGNOSIS — Z5111 Encounter for antineoplastic chemotherapy: Secondary | ICD-10-CM | POA: Diagnosis not present

## 2020-04-24 LAB — CMP (CANCER CENTER ONLY)
ALT: 17 U/L (ref 0–44)
AST: 16 U/L (ref 15–41)
Albumin: 3.3 g/dL — ABNORMAL LOW (ref 3.5–5.0)
Alkaline Phosphatase: 111 U/L (ref 38–126)
Anion gap: 9 (ref 5–15)
BUN: 6 mg/dL — ABNORMAL LOW (ref 8–23)
CO2: 24 mmol/L (ref 22–32)
Calcium: 8.7 mg/dL — ABNORMAL LOW (ref 8.9–10.3)
Chloride: 109 mmol/L (ref 98–111)
Creatinine: 0.78 mg/dL (ref 0.44–1.00)
GFR, Est AFR Am: >60 mL/min (ref >60)
GFR, Estimated: >60 mL/min (ref >60)
Glucose, Bld: 106 mg/dL — ABNORMAL HIGH (ref 70–99)
Potassium: 3.8 mmol/L (ref 3.5–5.1)
Sodium: 142 mmol/L (ref 135–145)
Total Bilirubin: 0.4 mg/dL (ref 0.3–1.2)
Total Protein: 6.2 g/dL — ABNORMAL LOW (ref 6.5–8.1)

## 2020-04-24 LAB — CBC WITH DIFFERENTIAL (CANCER CENTER ONLY)
Abs Immature Granulocytes: 0.22 10*3/uL — ABNORMAL HIGH (ref 0.00–0.07)
Basophils Absolute: 0.1 10*3/uL (ref 0.0–0.1)
Basophils Relative: 1 %
Eosinophils Absolute: 0.5 10*3/uL (ref 0.0–0.5)
Eosinophils Relative: 5 %
HCT: 31.4 % — ABNORMAL LOW (ref 36.0–46.0)
Hemoglobin: 10.5 g/dL — ABNORMAL LOW (ref 12.0–15.0)
Immature Granulocytes: 2 %
Lymphocytes Relative: 19 %
Lymphs Abs: 2 10*3/uL (ref 0.7–4.0)
MCH: 30.2 pg (ref 26.0–34.0)
MCHC: 33.4 g/dL (ref 30.0–36.0)
MCV: 90.2 fL (ref 80.0–100.0)
Monocytes Absolute: 0.6 10*3/uL (ref 0.1–1.0)
Monocytes Relative: 6 %
Neutro Abs: 6.9 10*3/uL (ref 1.7–7.7)
Neutrophils Relative %: 67 %
Platelet Count: 126 10*3/uL — ABNORMAL LOW (ref 150–400)
RBC: 3.48 MIL/uL — ABNORMAL LOW (ref 3.87–5.11)
RDW: 14.4 % (ref 11.5–15.5)
WBC Count: 10.3 10*3/uL (ref 4.0–10.5)
nRBC: 0 % (ref 0.0–0.2)

## 2020-04-24 MED ORDER — PALONOSETRON HCL INJECTION 0.25 MG/5ML
INTRAVENOUS | Status: AC
  Filled 2020-04-24: qty 5

## 2020-04-24 MED ORDER — SODIUM CHLORIDE 0.9 % IV SOLN
2000.0000 mg/m2 | INTRAVENOUS | Status: DC
  Administered 2020-04-24: 3600 mg via INTRAVENOUS
  Filled 2020-04-24: qty 72

## 2020-04-24 MED ORDER — SODIUM CHLORIDE 0.9 % IV SOLN
150.0000 mg/m2 | Freq: Once | INTRAVENOUS | Status: AC
  Administered 2020-04-24: 280 mg via INTRAVENOUS
  Filled 2020-04-24: qty 14

## 2020-04-24 MED ORDER — PROCHLORPERAZINE MALEATE 10 MG PO TABS
10.0000 mg | ORAL_TABLET | Freq: Four times a day (QID) | ORAL | Status: DC | PRN
  Administered 2020-04-24: 10 mg via ORAL

## 2020-04-24 MED ORDER — SODIUM CHLORIDE 0.9 % IV SOLN
10.0000 mg | Freq: Once | INTRAVENOUS | Status: AC
Start: 2020-04-24 — End: 2020-04-24
  Administered 2020-04-24: 10 mg via INTRAVENOUS
  Filled 2020-04-24: qty 10

## 2020-04-24 MED ORDER — OXALIPLATIN CHEMO INJECTION 100 MG/20ML
84.0000 mg/m2 | Freq: Once | INTRAVENOUS | Status: AC
  Administered 2020-04-24: 150 mg via INTRAVENOUS
  Filled 2020-04-24: qty 30

## 2020-04-24 MED ORDER — SODIUM CHLORIDE 0.9 % IV SOLN
150.0000 mg | Freq: Once | INTRAVENOUS | Status: AC
  Administered 2020-04-24: 150 mg via INTRAVENOUS
  Filled 2020-04-24: qty 150

## 2020-04-24 MED ORDER — PALONOSETRON HCL INJECTION 0.25 MG/5ML
0.2500 mg | Freq: Once | INTRAVENOUS | Status: AC
  Administered 2020-04-24: 0.25 mg via INTRAVENOUS

## 2020-04-24 MED ORDER — DEXTROSE 5 % IV SOLN
Freq: Once | INTRAVENOUS | Status: AC
  Filled 2020-04-24: qty 250

## 2020-04-24 MED ORDER — SODIUM CHLORIDE 0.9 % IV SOLN
400.0000 mg/m2 | Freq: Once | INTRAVENOUS | Status: AC
  Administered 2020-04-24: 720 mg via INTRAVENOUS
  Filled 2020-04-24: qty 36

## 2020-04-24 MED ORDER — PROCHLORPERAZINE MALEATE 10 MG PO TABS
ORAL_TABLET | ORAL | Status: AC
  Filled 2020-04-24: qty 1

## 2020-04-24 NOTE — Progress Notes (Signed)
[facility] OFFICE PROGRESS NOTE   Diagnosis: Pancreas cancer  INTERVAL HISTORY:   Ms. Diane returns as scheduled.  She complete another cycle of FOLFIRINOX on 04/11/2020.  She did not have significant nausea following chemotherapy.  She reports mild diarrhea for the past several days.  No mouth sores.  No neuropathy symptoms at present.  She had a dry mouth, after receiving atropine.  She developed altered sensation at the tongue and difficulty speaking.  The tongue and speech symptoms were felt to be related to oxaliplatin and spontaneously resolved.  Ms. Boivin underwent an ultrasound-guided biopsy of a left supraclavicular lymph node on 04/18/2020.  The pathology revealed poorly differentiated adenocarcinoma, positive for cytokeratin 7.  Distinct lymph node tissue was not identified in the biopsy.  Her pain is under good control with MS Contin and oxycodone. Objective:  Vital signs in last 24 hours:  Blood pressure (!) 109/51, pulse 65, temperature 98 F (36.7 C), temperature source Oral, resp. rate 16, weight 141 lb 9.6 oz (64.2 kg), SpO2 99 %.    HEENT: No thrush or ulcers Resp: Scattered end inspiratory rhonchi, no respiratory distress Cardio: Regular rate and rhythm GI: Nontender, no hepatosplenomegaly, no mass Vascular: No leg edema    Portacath/PICC-without erythema  Lab Results:  Lab Results  Component Value Date   WBC 10.3 04/24/2020   HGB 10.5 (L) 04/24/2020   HCT 31.4 (L) 04/24/2020   MCV 90.2 04/24/2020   PLT 126 (L) 04/24/2020   NEUTROABS 6.9 04/24/2020    CMP  Lab Results  Component Value Date   NA 142 04/24/2020   K 3.8 04/24/2020   CL 109 04/24/2020   CO2 24 04/24/2020   GLUCOSE 106 (H) 04/24/2020   BUN 6 (L) 04/24/2020   CREATININE 0.78 04/24/2020   CALCIUM 8.7 (L) 04/24/2020   PROT 6.2 (L) 04/24/2020   ALBUMIN 3.3 (L) 04/24/2020   AST 16 04/24/2020   ALT 17 04/24/2020   ALKPHOS 111 04/24/2020   BILITOT 0.4 04/24/2020   GFRNONAA >60 04/24/2020   GFRAA >60 04/24/2020    Medications: I have reviewed the patient's current medications.   Assessment/Plan:  1. 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 MRI abdomen 02/01/2020-2.2 x 3.3 cm mass in the posterior pancreas head/uncinate process, mild intrahepatic/extrahepatic ductal dilatation, no evidence of vascular invasion, possible small lymph nodes in the porta hepatis-poorly visualized ? EUS 02/29/2020-20 x 23 mm mass in the pancreas head, upstream pancreatic duct dilatation, 1 abnormal peripancreatic node, uT3?uN1 ? 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 CTs 03/19/2020-2.9 x 2.1 x 3.1 cm pancreas head/uncinate mass, lesion associated with infrarenal abdominal aorta and superior mesenteric artery, prominent and borderline enlarged retroperitoneal nodes including a left periaortic node, indeterminate 4 mm lung nodule ? Cycle 1 mFOLFIRINOX [id number], complicated by n/v and hospital admission 5/2/ - 5/3 ? Second opinion at [facility] with Dr. Earnestine Mealing and Dr. Mariah Milling 04/02/20  ? PET scan 04/03/2020-hypermetabolic poorly marginated pancreatic head mass; 2 hypermetabolic left periaortic lymph nodes, hypermetabolic left supraclavicular lymph node  ? Cycle 2 FOLFIRINOX 04/11/2020 ? Ultrasound-guided biopsy of a left supraclavicular mass on 04/18/2020-poorly differentiated adenocarcinoma, cytokeratin 7+, cytokeratin 20 and CDX2 positive in rare cells. ? Cycle 3 FOLFIRINOX 04/24/2020-oxaliplatin infusion lengthened to 3 hours  2. Abdomen/back pain secondary to #1 3. Anorexia/weight loss secondary to #1 4. History of coronary artery disease,STEMI with the fib arrest 2011, status post RCA stent 5. Constipation, secondary to #1  and morphine  6.   Hospital admission for n/v after cycle 1 FOLFIRINOX 5/2 - 5/3 --prophylactic dexamethasone beginning day 2   Disposition: Ms. Grigg has completed 2 cycles of  FOLFIRINOX.  I suspect the acute symptoms she experienced during cycle 2 were related to oxaliplatin neurotoxicity.  We will lengthen the oxaliplatin infusion to 3 hours with this cycle. The left supraclavicular biopsy confirmed metastatic adenocarcinoma.  I discussed the biopsy findings with Ms. Bickel.  She will not be a surgical candidate.  The plan is to continue FOLFIRINOX and schedule a restaging evaluation after cycle 5.  She will continue MS Contin and oxycodone for pain.  Ms. Susman will return for an office visit and chemotherapy in 2 weeks.  She has mild thrombocytopenia.  She will contact us for bleeding or bruising.  Betsy Coder, MD  04/24/2020  10:47 AM

## 2020-04-24 NOTE — Patient Instructions (Signed)
Please provide copy of Medical Advanced Directive to have scanned into your record.

## 2020-04-24 NOTE — Progress Notes (Signed)
Per Dr. Benay Spice: Do not give Atropine and infuse oxaliplatin over 3 hours

## 2020-04-24 NOTE — Telephone Encounter (Signed)
Scheduled per 5/26 los. Printed calendar for pt. No avs needed.

## 2020-04-24 NOTE — Patient Instructions (Signed)
[facility] Discharge Instructions for Patients Receiving Chemotherapy  Today you received the following chemotherapy agents oxaliplatin, irinotecan, leucovorin, and fluorouracil.  To help prevent nausea and vomiting after your treatment, we encourage you to take your nausea medication as directed.   If you develop nausea and vomiting that is not controlled by your nausea medication, call the clinic.   BELOW ARE SYMPTOMS THAT SHOULD BE REPORTED IMMEDIATELY:  *FEVER GREATER THAN 100.5 F  *CHILLS WITH OR WITHOUT FEVER  NAUSEA AND VOMITING THAT IS NOT CONTROLLED WITH YOUR NAUSEA MEDICATION  *UNUSUAL SHORTNESS OF BREATH  *UNUSUAL BRUISING OR BLEEDING  TENDERNESS IN MOUTH AND THROAT WITH OR WITHOUT PRESENCE OF ULCERS  *URINARY PROBLEMS  *BOWEL PROBLEMS  UNUSUAL RASH Items with * indicate a potential emergency and should be followed up as soon as possible.  Feel free to call the clinic should you have any questions or concerns. The clinic phone number is [phone].  Please show the CHEMO ALERT CARD at check-in to the Emergency Department and triage nurse.   

## 2020-04-25 ENCOUNTER — Telehealth: Payer: Self-pay | Admitting: Oncology

## 2020-04-25 NOTE — Telephone Encounter (Signed)
Scheduled per 5/26 sch message. Pt aware of appt.

## 2020-04-26 ENCOUNTER — Other Ambulatory Visit: Payer: Self-pay

## 2020-04-26 ENCOUNTER — Inpatient Hospital Stay: Payer: 59

## 2020-04-26 DIAGNOSIS — Z5111 Encounter for antineoplastic chemotherapy: Secondary | ICD-10-CM | POA: Diagnosis not present

## 2020-04-26 DIAGNOSIS — Z95828 Presence of other vascular implants and grafts: Secondary | ICD-10-CM

## 2020-04-26 MED ORDER — SODIUM CHLORIDE 0.9% FLUSH
10.0000 mL | INTRAVENOUS | Status: DC | PRN
  Administered 2020-04-26: 10 mL via INTRAVENOUS
  Filled 2020-04-26: qty 10

## 2020-04-26 MED ORDER — HEPARIN SOD (PORK) LOCK FLUSH 100 UNIT/ML IV SOLN
500.0000 [IU] | Freq: Once | INTRAVENOUS | Status: DC
  Filled 2020-04-26: qty 5

## 2020-05-02 NOTE — Progress Notes (Signed)
Pharmacist Chemotherapy Monitoring - Follow Up Assessment    I verify that I have reviewed each item in the below checklist:  . Regimen for the patient is scheduled for the appropriate day and plan matches scheduled date. Marland Kitchen Appropriate non-routine labs are ordered dependent on drug ordered. . If applicable, additional medications reviewed and ordered per protocol based on lifetime cumulative doses and/or treatment regimen.   Plan for follow-up and/or issues identified: No . I-vent associated with next due treatment: No . MD and/or nursing notified: No  Najia Hurlbutt K 05/02/2020 11:55 AM

## 2020-05-05 ENCOUNTER — Other Ambulatory Visit: Payer: Self-pay | Admitting: Oncology

## 2020-05-08 ENCOUNTER — Inpatient Hospital Stay: Payer: 59

## 2020-05-08 ENCOUNTER — Ambulatory Visit: Payer: 59 | Admitting: Medical

## 2020-05-08 ENCOUNTER — Other Ambulatory Visit: Payer: Self-pay

## 2020-05-08 ENCOUNTER — Inpatient Hospital Stay: Payer: 59 | Attending: Oncology | Admitting: Oncology

## 2020-05-08 ENCOUNTER — Other Ambulatory Visit: Payer: Self-pay | Admitting: *Deleted

## 2020-05-08 ENCOUNTER — Inpatient Hospital Stay: Payer: 59 | Admitting: Nutrition

## 2020-05-08 VITALS — BP 128/76 | HR 66 | Temp 97.6°F | Resp 18 | Ht 66.0 in | Wt 135.3 lb

## 2020-05-08 DIAGNOSIS — Z5189 Encounter for other specified aftercare: Secondary | ICD-10-CM | POA: Insufficient documentation

## 2020-05-08 DIAGNOSIS — Z5111 Encounter for antineoplastic chemotherapy: Secondary | ICD-10-CM | POA: Insufficient documentation

## 2020-05-08 DIAGNOSIS — C250 Malignant neoplasm of head of pancreas: Secondary | ICD-10-CM

## 2020-05-08 LAB — CBC WITH DIFFERENTIAL (CANCER CENTER ONLY)
Abs Immature Granulocytes: 0.01 10*3/uL (ref 0.00–0.07)
Basophils Absolute: 0 10*3/uL (ref 0.0–0.1)
Basophils Relative: 1 %
Eosinophils Absolute: 0.2 10*3/uL (ref 0.0–0.5)
Eosinophils Relative: 6 %
HCT: 30.3 % — ABNORMAL LOW (ref 36.0–46.0)
Hemoglobin: 10.3 g/dL — ABNORMAL LOW (ref 12.0–15.0)
Immature Granulocytes: 0 %
Lymphocytes Relative: 37 %
Lymphs Abs: 1.2 10*3/uL (ref 0.7–4.0)
MCH: 30.8 pg (ref 26.0–34.0)
MCHC: 34 g/dL (ref 30.0–36.0)
MCV: 90.7 fL (ref 80.0–100.0)
Monocytes Absolute: 0.5 10*3/uL (ref 0.1–1.0)
Monocytes Relative: 14 %
Neutro Abs: 1.4 10*3/uL — ABNORMAL LOW (ref 1.7–7.7)
Neutrophils Relative %: 42 %
Platelet Count: 274 10*3/uL (ref 150–400)
RBC: 3.34 MIL/uL — ABNORMAL LOW (ref 3.87–5.11)
RDW: 14.5 % (ref 11.5–15.5)
WBC Count: 3.4 10*3/uL — ABNORMAL LOW (ref 4.0–10.5)
nRBC: 0 % (ref 0.0–0.2)

## 2020-05-08 LAB — CMP (CANCER CENTER ONLY)
ALT: 45 U/L — ABNORMAL HIGH (ref 0–44)
AST: 23 U/L (ref 15–41)
Albumin: 3.4 g/dL — ABNORMAL LOW (ref 3.5–5.0)
Alkaline Phosphatase: 157 U/L — ABNORMAL HIGH (ref 38–126)
Anion gap: 13 (ref 5–15)
BUN: 6 mg/dL — ABNORMAL LOW (ref 8–23)
CO2: 20 mmol/L — ABNORMAL LOW (ref 22–32)
Calcium: 9 mg/dL (ref 8.9–10.3)
Chloride: 109 mmol/L (ref 98–111)
Creatinine: 0.67 mg/dL (ref 0.44–1.00)
GFR, Est AFR Am: >60 mL/min (ref >60)
GFR, Estimated: >60 mL/min (ref >60)
Glucose, Bld: 96 mg/dL (ref 70–99)
Potassium: 3.6 mmol/L (ref 3.5–5.1)
Sodium: 142 mmol/L (ref 135–145)
Total Bilirubin: 0.3 mg/dL (ref 0.3–1.2)
Total Protein: 6.3 g/dL — ABNORMAL LOW (ref 6.5–8.1)

## 2020-05-08 LAB — MAGNESIUM: Magnesium: 1.6 mg/dL — ABNORMAL LOW (ref 1.7–2.4)

## 2020-05-08 MED ORDER — HEPARIN SOD (PORK) LOCK FLUSH 100 UNIT/ML IV SOLN
500.0000 [IU] | Freq: Once | INTRAVENOUS | Status: DC | PRN
  Filled 2020-05-08: qty 5

## 2020-05-08 MED ORDER — PALONOSETRON HCL INJECTION 0.25 MG/5ML
0.2500 mg | Freq: Once | INTRAVENOUS | Status: AC
  Administered 2020-05-08: 0.25 mg via INTRAVENOUS

## 2020-05-08 MED ORDER — SODIUM CHLORIDE 0.9% FLUSH
10.0000 mL | INTRAVENOUS | Status: DC | PRN
  Filled 2020-05-08: qty 10

## 2020-05-08 MED ORDER — DEXAMETHASONE 4 MG PO TABS
8.0000 mg | ORAL_TABLET | Freq: Two times a day (BID) | ORAL | 1 refills | Status: DC
Start: 2020-05-08 — End: 2020-06-26

## 2020-05-08 MED ORDER — MORPHINE SULFATE ER 15 MG PO TBCR: 15.0000 mg | EXTENDED_RELEASE_TABLET | Freq: Two times a day (BID) | ORAL | 0 refills | Status: DC

## 2020-05-08 MED ORDER — SODIUM CHLORIDE 0.9 % IV SOLN
10.0000 mg | Freq: Once | INTRAVENOUS | Status: AC
  Administered 2020-05-08: 10 mg via INTRAVENOUS
  Filled 2020-05-08: qty 10

## 2020-05-08 MED ORDER — SODIUM CHLORIDE 0.9 % IV SOLN
150.0000 mg/m2 | Freq: Once | INTRAVENOUS | Status: AC
  Administered 2020-05-08: 260 mg via INTRAVENOUS
  Filled 2020-05-08: qty 13

## 2020-05-08 MED ORDER — SODIUM CHLORIDE 0.9 % IV SOLN
400.0000 mg/m2 | Freq: Once | INTRAVENOUS | Status: AC
  Administered 2020-05-08: 676 mg via INTRAVENOUS
  Filled 2020-05-08: qty 33.8

## 2020-05-08 MED ORDER — FAMOTIDINE IN NACL 20-0.9 MG/50ML-% IV SOLN
20.0000 mg | Freq: Once | INTRAVENOUS | Status: AC | PRN
  Administered 2020-05-08: 20 mg via INTRAVENOUS

## 2020-05-08 MED ORDER — SODIUM CHLORIDE 0.9 % IV SOLN
2000.0000 mg/m2 | INTRAVENOUS | Status: DC
  Administered 2020-05-08: 3400 mg via INTRAVENOUS
  Filled 2020-05-08: qty 68

## 2020-05-08 MED ORDER — FAMOTIDINE IN NACL 20-0.9 MG/50ML-% IV SOLN
INTRAVENOUS | Status: AC
  Filled 2020-05-08: qty 50

## 2020-05-08 MED ORDER — DEXTROSE 5 % IV SOLN
Freq: Once | INTRAVENOUS | Status: AC
  Filled 2020-05-08: qty 250

## 2020-05-08 MED ORDER — SODIUM CHLORIDE 0.9 % IV SOLN
150.0000 mg | Freq: Once | INTRAVENOUS | Status: AC
  Administered 2020-05-08: 150 mg via INTRAVENOUS
  Filled 2020-05-08: qty 150

## 2020-05-08 MED ORDER — PALONOSETRON HCL INJECTION 0.25 MG/5ML
INTRAVENOUS | Status: AC
  Filled 2020-05-08: qty 5

## 2020-05-08 MED ORDER — OXYCODONE-ACETAMINOPHEN 10-325 MG PO TABS: 1.0000 | ORAL_TABLET | ORAL | 0 refills | Status: DC | PRN

## 2020-05-08 MED ORDER — OXALIPLATIN CHEMO INJECTION 100 MG/20ML
85.0000 mg/m2 | Freq: Once | INTRAVENOUS | Status: AC
  Administered 2020-05-08: 145 mg via INTRAVENOUS
  Filled 2020-05-08: qty 10

## 2020-05-08 MED ORDER — MAGIC MOUTHWASH: 5.0000 mL | Freq: Four times a day (QID) | ORAL | 1 refills | Status: DC | PRN

## 2020-05-08 NOTE — Progress Notes (Signed)
Per Dr. Benay Spice: OK to treat today w/ANC 1.4 (did not receive her ziextenzo on day 3) and OK to treat w/Mg+ 1.6--no new orders at this time

## 2020-05-08 NOTE — Progress Notes (Signed)
Nutrition follow-up completed with patient during infusion for pancreas cancer. Weight continues to decline and was documented as 135.3 pounds June 9, down from 167.8 pounds December 2020. Patient continues to have nausea with vomiting despite antiemetic therapy. She reports severe nausea and vomiting on day 7 after treatment.  Patient states MD has increased Decadron. Reports she has 4 days of constipation after treatment and then it changes to diarrhea. She has not tolerated any oral nutrition supplements including boost Soothe. Reports she is going to begin making smoothies with fruit, protein powder and milk. Noted labs: albumin 3.4, magnesium 1.6.  Nutrition diagnosis: Unintended weight loss continues.  Intervention: Reviewed strategies for nausea and recommended patient continue nausea medications as prescribed by MD.  I have encouraged her to contact provider if her nausea is not controlled. Educated patient on strategies for increasing calories and protein. Reviewed strategies for improving diarrhea. Questions were answered.  Teach back method used.  Monitoring, evaluation, goals: Patient will tolerate increased calories and protein to minimize further weight loss.  Next visit: Wednesday, June 23 during infusion.  **Disclaimer: This note was dictated with voice recognition software. Similar sounding words can inadvertently be transcribed and this note may contain transcription errors which may not have been corrected upon publication of note.**

## 2020-05-08 NOTE — Patient Instructions (Signed)

## 2020-05-08 NOTE — Progress Notes (Signed)
Around 1555 the patient began to c/o of nausea, shaking and tongue feeling thick. The infusion was paused and emergency protocol was put in place. [name] called, see MAR. The nurse was instructed to re-start the infusion after pepcid was given.  VSS.

## 2020-05-08 NOTE — Progress Notes (Signed)
[facility] OFFICE PROGRESS NOTE   Diagnosis: Pancreas cancer  INTERVAL HISTORY:   Vicki [name] complete another cycle of FOLFIRINOX on 04/24/2020.  She developed nausea beginning a few days following chemotherapy.  She had a few episodes of emesis during the week following chemotherapy.  She took 2 days of Decadron prophylaxis.  Compazine helps the nausea.  She has persistent cold sensitivity.  No peripheral numbness.  No diarrhea.  The mouth was sore following chemotherapy without discrete ulcers. Abdominal pain is well controlled with MS Contin and oxycodone.  She has a poor appetite.  Objective:  Vital signs in last 24 hours:  Blood pressure 128/76, pulse 66, temperature 97.6 F (36.4 C), temperature source Temporal, resp. rate 18, height 5\' 6"  (1.676 m), weight 135 lb 4.8 oz (61.4 kg), SpO2 100 %.    HEENT: No thrush or ulcers Resp: Scattered end inspiratory rhonchi, no respiratory distress Cardio: Regular rate and rhythm GI: Nontender, no hepatosplenomegaly, no mass Vascular: No leg edema   Portacath/PICC-without erythema  Lab Results:  Lab Results  Component Value Date   WBC 3.4 (L) 05/08/2020   HGB 10.3 (L) 05/08/2020   HCT 30.3 (L) 05/08/2020   MCV 90.7 05/08/2020   PLT 274 05/08/2020   NEUTROABS 1.4 (L) 05/08/2020    CMP  Lab Results  Component Value Date   NA 142 05/08/2020   K 3.6 05/08/2020   CL 109 05/08/2020   CO2 20 (L) 05/08/2020   GLUCOSE 96 05/08/2020   BUN 6 (L) 05/08/2020   CREATININE 0.67 05/08/2020   CALCIUM 9.0 05/08/2020   PROT 6.3 (L) 05/08/2020   ALBUMIN 3.4 (L) 05/08/2020   AST 23 05/08/2020   ALT 45 (H) 05/08/2020   ALKPHOS 157 (H) 05/08/2020   BILITOT 0.3 05/08/2020   GFRNONAA >60 05/08/2020   GFRAA >60 05/08/2020     Medications: I have reviewed the patient's current medications.   Assessment/Plan:  1. 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 MRI abdomen 02/01/2020-2.2 x  3.3 cm mass in the posterior pancreas head/uncinate process, mild intrahepatic/extrahepatic ductal dilatation, no evidence of vascular invasion, possible small lymph nodes in the porta hepatis-poorly visualized ? EUS 02/29/2020-20 x 23 mm mass in the pancreas head, upstream pancreatic duct dilatation, 1 abnormal peripancreatic node, uT3?uN1 ? 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 CTs 03/19/2020-2.9 x 2.1 x 3.1 cm pancreas head/uncinate mass, lesion associated with infrarenal abdominal aorta and superior mesenteric artery, prominent and borderline enlarged retroperitoneal nodes including a left periaortic node, indeterminate 4 mm lung nodule ? Cycle 1 mFOLFIRINOX 02/11/96, complicated by n/v and hospital admission 5/2/ - 5/3 ? Second opinion at [facility] with Dr. Earnestine Mealing and Dr. Mariah Milling 04/02/20  ? PET scan 04/03/2020-hypermetabolic poorly marginated pancreatic head mass; 2 hypermetabolic left periaortic lymph nodes, hypermetabolic left supraclavicular lymph node  ? Cycle 2 FOLFIRINOX 04/11/2020 ? Ultrasound-guided biopsy of a left supraclavicular mass on 04/18/2020-poorly differentiated adenocarcinoma, cytokeratin 7+, cytokeratin 20 and CDX2 positive in rare cells. ? Cycle 3 FOLFIRINOX 04/24/2020-oxaliplatin infusion lengthened to 3 hours, Udenyca was not given (ordered) ? Cycle 4 FOLFIRINOX 05/08/2020  2. Abdomen/back pain secondary to #1 3. Anorexia/weight loss secondary to #1 4. History of coronary artery disease,STEMI with the fib arrest 2011, status post RCA stent 5. Constipation, secondary to #1 and morphine  6.   Hospital admission for n/v after cycle 1 FOLFIRINOX 5/2 - 5/3 --prophylactic dexamethasone beginning day 2, Decadron dose increased beginning  with cycle 4 FOLFIRINOX    Disposition: Ms. Chiasson appears stable.  She has completed 3 cycles of FOLFIRINOX.  She has mild symptoms of oxaliplatin neuropathy.  She has delayed nausea following chemotherapy.  We  increased the Decadron to 8 mg twice daily for 3 days following chemotherapy.  She will use Magic mouthwash as needed.  She will continue the current narcotic pain regimen.  We refilled the MS Contin and oxycodone today. Ms. Disbrow will meet with the Cancer center nutritionist today.  She will return for an office visit in the next cycle of chemotherapy in 2 weeks.  She will undergo a restaging CT evaluation after cycle 5.  She has mild neutropenia today.  She knows to call for a fever or symptoms of infection.  She will receive growth factor support with this cycle.  Betsy Coder, MD  05/08/2020  10:12 AM

## 2020-05-08 NOTE — Patient Instructions (Signed)
[facility] Discharge Instructions for Patients Receiving Chemotherapy  Today you received the following chemotherapy agents oxaliplatin, irinotecan, leucovorin, and fluorouracil.  To help prevent nausea and vomiting after your treatment, we encourage you to take your nausea medication as directed.   If you develop nausea and vomiting that is not controlled by your nausea medication, call the clinic.   BELOW ARE SYMPTOMS THAT SHOULD BE REPORTED IMMEDIATELY:  *FEVER GREATER THAN 100.5 F  *CHILLS WITH OR WITHOUT FEVER  NAUSEA AND VOMITING THAT IS NOT CONTROLLED WITH YOUR NAUSEA MEDICATION  *UNUSUAL SHORTNESS OF BREATH  *UNUSUAL BRUISING OR BLEEDING  TENDERNESS IN MOUTH AND THROAT WITH OR WITHOUT PRESENCE OF ULCERS  *URINARY PROBLEMS  *BOWEL PROBLEMS  UNUSUAL RASH Items with * indicate a potential emergency and should be followed up as soon as possible.  Feel free to call the clinic should you have any questions or concerns. The clinic phone number is [phone].  Please show the CHEMO ALERT CARD at check-in to the Emergency Department and triage nurse.   

## 2020-05-09 LAB — CANCER ANTIGEN 19-9: CA 19-9: 54 U/mL — ABNORMAL HIGH (ref 0–35)

## 2020-05-09 NOTE — Progress Notes (Signed)
    DATE:  05/08/2020                                          X  CHEMO/IMMUNOTHERAPY REACTION            MD:  Dr. Dominica Severin B. Sherrill  AGENT/BLOOD PRODUCT RECEIVING TODAY:               Oxaliplatin, irinotecan, and leucovorin   AGENT/BLOOD PRODUCT RECEIVING IMMEDIATELY PRIOR TO REACTION:           Oxaliplatin    REACTION(S):            Thick tongue, slurred speech, dizziness, and sweating   PREMEDS:      Aloxi, Emend, and dexamethasone   INTERVENTION: Patient was given Pepcid 20 mg IV x1 and reassured that her reactions was secondary to oxaliplatin.   Review of Systems  Review of Systems  Constitutional: Positive for diaphoresis. Negative for chills and fever.  HENT: Positive for voice change. Negative for trouble swallowing.   Respiratory: Negative for cough, chest tightness, shortness of breath and wheezing.   Cardiovascular: Negative for chest pain and palpitations.  Gastrointestinal: Negative for abdominal pain, constipation, diarrhea, nausea and vomiting.  Musculoskeletal: Negative for back pain and myalgias.  Neurological: Positive for dizziness. Negative for light-headedness and headaches.     Physical Exam  Physical Exam Constitutional:      General: She is not in acute distress.    Appearance: She is not diaphoretic.  HENT:     Head: Normocephalic and atraumatic.  Cardiovascular:     Rate and Rhythm: Normal rate and regular rhythm.     Heart sounds: Normal heart sounds. No murmur heard.  No friction rub. No gallop.   Pulmonary:     Effort: Pulmonary effort is normal. No respiratory distress.     Breath sounds: Normal breath sounds. No wheezing or rales.  Skin:    General: Skin is warm and dry.     Findings: No erythema or rash.  Neurological:     Mental Status: She is alert.     OUTCOME:                 The patient's symptoms abated after dosing with Pepcid.  She was able to complete the remainder of her infusion today.   [name], MHS, PA-C

## 2020-05-10 ENCOUNTER — Inpatient Hospital Stay: Payer: 59

## 2020-05-10 ENCOUNTER — Telehealth: Payer: Self-pay | Admitting: *Deleted

## 2020-05-10 ENCOUNTER — Other Ambulatory Visit: Payer: Self-pay

## 2020-05-10 VITALS — BP 123/73 | HR 62 | Resp 18

## 2020-05-10 DIAGNOSIS — Z5111 Encounter for antineoplastic chemotherapy: Secondary | ICD-10-CM | POA: Diagnosis not present

## 2020-05-10 DIAGNOSIS — Z95828 Presence of other vascular implants and grafts: Secondary | ICD-10-CM

## 2020-05-10 DIAGNOSIS — C250 Malignant neoplasm of head of pancreas: Secondary | ICD-10-CM

## 2020-05-10 MED ORDER — PEGFILGRASTIM-BMEZ 6 MG/0.6ML ~~LOC~~ SOSY
6.0000 mg | PREFILLED_SYRINGE | Freq: Once | SUBCUTANEOUS | Status: AC
  Administered 2020-05-10: 6 mg via SUBCUTANEOUS
  Filled 2020-05-10: qty 0.6

## 2020-05-10 MED ORDER — HEPARIN SOD (PORK) LOCK FLUSH 100 UNIT/ML IV SOLN
500.0000 [IU] | Freq: Once | INTRAVENOUS | Status: AC
  Administered 2020-05-10: 500 [IU] via INTRAVENOUS
  Filled 2020-05-10: qty 5

## 2020-05-10 MED ORDER — SODIUM CHLORIDE 0.9% FLUSH
10.0000 mL | INTRAVENOUS | Status: DC | PRN
  Administered 2020-05-10: 10 mL via INTRAVENOUS
  Filled 2020-05-10: qty 10

## 2020-05-10 NOTE — Patient Instructions (Signed)

## 2020-05-10 NOTE — Telephone Encounter (Signed)
Left VM requesting information on her appointments due on 06/05/20 and status of her CT scan due after next cycle (05/22/20).  Next concern was that that her disability forms from [employer] that she brought on 05/08/20 have not been returned. This RN compiled the information requested by [employer] and records were faxed w/approximate return to work date delay until at least 08/12/20. Faxed to [phone]. Per Dr. Benay Spice: CT not due till after tx on 6/23, but OK to order it now. Called patient w/update on her above inquiries.

## 2020-05-16 ENCOUNTER — Ambulatory Visit: Payer: 59 | Admitting: Family Medicine

## 2020-05-18 ENCOUNTER — Other Ambulatory Visit: Payer: Self-pay | Admitting: Oncology

## 2020-05-22 ENCOUNTER — Inpatient Hospital Stay (HOSPITAL_BASED_OUTPATIENT_CLINIC_OR_DEPARTMENT_OTHER): Payer: 59 | Admitting: Oncology

## 2020-05-22 ENCOUNTER — Inpatient Hospital Stay: Payer: 59

## 2020-05-22 ENCOUNTER — Other Ambulatory Visit: Payer: Self-pay

## 2020-05-22 ENCOUNTER — Inpatient Hospital Stay: Payer: 59 | Admitting: Nutrition

## 2020-05-22 ENCOUNTER — Other Ambulatory Visit: Payer: 59

## 2020-05-22 VITALS — BP 134/73 | HR 62 | Temp 97.9°F | Resp 20 | Ht 66.0 in | Wt 138.7 lb

## 2020-05-22 DIAGNOSIS — C250 Malignant neoplasm of head of pancreas: Secondary | ICD-10-CM | POA: Diagnosis not present

## 2020-05-22 DIAGNOSIS — Z5111 Encounter for antineoplastic chemotherapy: Secondary | ICD-10-CM | POA: Diagnosis not present

## 2020-05-22 DIAGNOSIS — Z95828 Presence of other vascular implants and grafts: Secondary | ICD-10-CM

## 2020-05-22 LAB — CBC WITH DIFFERENTIAL (CANCER CENTER ONLY)
Abs Immature Granulocytes: 0.2 10*3/uL — ABNORMAL HIGH (ref 0.00–0.07)
Basophils Absolute: 0 10*3/uL (ref 0.0–0.1)
Basophils Relative: 0 %
Eosinophils Absolute: 0.1 10*3/uL (ref 0.0–0.5)
Eosinophils Relative: 1 %
HCT: 30.2 % — ABNORMAL LOW (ref 36.0–46.0)
Hemoglobin: 9.9 g/dL — ABNORMAL LOW (ref 12.0–15.0)
Immature Granulocytes: 2 %
Lymphocytes Relative: 15 %
Lymphs Abs: 1.9 10*3/uL (ref 0.7–4.0)
MCH: 30.9 pg (ref 26.0–34.0)
MCHC: 32.8 g/dL (ref 30.0–36.0)
MCV: 94.4 fL (ref 80.0–100.0)
Monocytes Absolute: 0.8 10*3/uL (ref 0.1–1.0)
Monocytes Relative: 6 %
Neutro Abs: 10.2 10*3/uL — ABNORMAL HIGH (ref 1.7–7.7)
Neutrophils Relative %: 76 %
Platelet Count: 134 10*3/uL — ABNORMAL LOW (ref 150–400)
RBC: 3.2 MIL/uL — ABNORMAL LOW (ref 3.87–5.11)
RDW: 15.4 % (ref 11.5–15.5)
WBC Count: 13.1 10*3/uL — ABNORMAL HIGH (ref 4.0–10.5)
nRBC: 0 % (ref 0.0–0.2)

## 2020-05-22 LAB — CMP (CANCER CENTER ONLY)
ALT: 20 U/L (ref 0–44)
AST: 15 U/L (ref 15–41)
Albumin: 3.1 g/dL — ABNORMAL LOW (ref 3.5–5.0)
Alkaline Phosphatase: 148 U/L — ABNORMAL HIGH (ref 38–126)
Anion gap: 9 (ref 5–15)
BUN: 6 mg/dL — ABNORMAL LOW (ref 8–23)
CO2: 24 mmol/L (ref 22–32)
Calcium: 8.8 mg/dL — ABNORMAL LOW (ref 8.9–10.3)
Chloride: 107 mmol/L (ref 98–111)
Creatinine: 0.74 mg/dL (ref 0.44–1.00)
GFR, Est AFR Am: >60 mL/min (ref >60)
GFR, Estimated: >60 mL/min (ref >60)
Glucose, Bld: 102 mg/dL — ABNORMAL HIGH (ref 70–99)
Potassium: 4.3 mmol/L (ref 3.5–5.1)
Sodium: 140 mmol/L (ref 135–145)
Total Bilirubin: 0.3 mg/dL (ref 0.3–1.2)
Total Protein: 6 g/dL — ABNORMAL LOW (ref 6.5–8.1)

## 2020-05-22 MED ORDER — LORAZEPAM 2 MG/ML IJ SOLN
INTRAMUSCULAR | Status: AC
  Filled 2020-05-22: qty 1

## 2020-05-22 MED ORDER — PALONOSETRON HCL INJECTION 0.25 MG/5ML
INTRAVENOUS | Status: AC
  Filled 2020-05-22: qty 5

## 2020-05-22 MED ORDER — SODIUM CHLORIDE 0.9 % IV SOLN
150.0000 mg | Freq: Once | INTRAVENOUS | Status: AC
  Administered 2020-05-22: 150 mg via INTRAVENOUS
  Filled 2020-05-22: qty 150

## 2020-05-22 MED ORDER — ATROPINE SULFATE 0.4 MG/ML IJ SOLN
INTRAMUSCULAR | Status: AC
  Filled 2020-05-22: qty 1

## 2020-05-22 MED ORDER — DEXTROSE 5 % IV SOLN
Freq: Once | INTRAVENOUS | Status: AC
  Filled 2020-05-22: qty 250

## 2020-05-22 MED ORDER — ATROPINE SULFATE 1 MG/ML IJ SOLN
0.4000 mg | Freq: Once | INTRAMUSCULAR | Status: AC
  Administered 2020-05-22: 0.4 mg via INTRAVENOUS

## 2020-05-22 MED ORDER — SODIUM CHLORIDE 0.9 % IV SOLN
400.0000 mg/m2 | Freq: Once | INTRAVENOUS | Status: AC
  Administered 2020-05-22: 676 mg via INTRAVENOUS
  Filled 2020-05-22: qty 33.8

## 2020-05-22 MED ORDER — SODIUM CHLORIDE 0.9 % IV SOLN
2000.0000 mg/m2 | INTRAVENOUS | Status: DC
  Administered 2020-05-22: 3400 mg via INTRAVENOUS
  Filled 2020-05-22: qty 68

## 2020-05-22 MED ORDER — OXALIPLATIN CHEMO INJECTION 100 MG/20ML
88.0000 mg/m2 | Freq: Once | INTRAVENOUS | Status: AC
  Administered 2020-05-22: 150 mg via INTRAVENOUS
  Filled 2020-05-22: qty 20

## 2020-05-22 MED ORDER — SODIUM CHLORIDE 0.9% FLUSH
10.0000 mL | Freq: Once | INTRAVENOUS | Status: AC
  Administered 2020-05-22: 10 mL via INTRAVENOUS
  Filled 2020-05-22: qty 10

## 2020-05-22 MED ORDER — SODIUM CHLORIDE 0.9 % IV SOLN
150.0000 mg/m2 | Freq: Once | INTRAVENOUS | Status: AC
  Administered 2020-05-22: 260 mg via INTRAVENOUS
  Filled 2020-05-22: qty 13

## 2020-05-22 MED ORDER — PALONOSETRON HCL INJECTION 0.25 MG/5ML
0.2500 mg | Freq: Once | INTRAVENOUS | Status: AC
  Administered 2020-05-22: 0.25 mg via INTRAVENOUS

## 2020-05-22 MED ORDER — SODIUM CHLORIDE 0.9 % IV SOLN
10.0000 mg | Freq: Once | INTRAVENOUS | Status: AC
  Administered 2020-05-22: 10 mg via INTRAVENOUS
  Filled 2020-05-22: qty 10

## 2020-05-22 MED ORDER — HEPARIN SOD (PORK) LOCK FLUSH 100 UNIT/ML IV SOLN
500.0000 [IU] | Freq: Once | INTRAVENOUS | Status: DC | PRN
  Filled 2020-05-22: qty 5

## 2020-05-22 MED ORDER — LORAZEPAM 2 MG/ML IJ SOLN
0.5000 mg | Freq: Once | INTRAMUSCULAR | Status: AC
  Administered 2020-05-22: 0.5 mg via INTRAVENOUS

## 2020-05-22 MED ORDER — SODIUM CHLORIDE 0.9% FLUSH
10.0000 mL | INTRAVENOUS | Status: DC | PRN
  Filled 2020-05-22: qty 10

## 2020-05-22 MED ORDER — ATROPINE SULFATE 1 MG/ML IJ SOLN
INTRAMUSCULAR | Status: AC
  Filled 2020-05-22: qty 1

## 2020-05-22 NOTE — Progress Notes (Signed)
Nutrition follow-up completed with patient during infusion for pancreas cancer. Weight improved slightly and was documented as 138.7 pounds on June 23, increased from 135.3 pounds June 9. Patient reports she has been able to eat a little bit more since last treatment. She reports nausea has improved and she denies vomiting. Bowels continue to alternate between constipation and diarrhea. Labs noted: Glucose 102 and albumin 3.1.  Nutrition diagnosis: Unintended weight loss improved.  Intervention: Provided support and encouragement for patient to continue strategies for minimizing nausea and vomiting. Encourage small frequent meals and snacks for weight maintenance. Provided support and encouragement.  Monitoring, evaluation, goals: Patient will tolerate adequate calories and protein to minimize weight loss.  Next visit: To be scheduled with upcoming treatment as needed.  **Disclaimer: This note was dictated with voice recognition software. Similar sounding words can inadvertently be transcribed and this note may contain transcription errors which may not have been corrected upon publication of note.**

## 2020-05-22 NOTE — Progress Notes (Signed)
[place] OFFICE PROGRESS NOTE   Diagnosis: Pancreas cancer  INTERVAL HISTORY:   Vicki [name] returns as scheduled.  She completed cycle 4 FOLFIRINOX on 05/08/2020.  She reports acute nausea, "dizziness ", and diaphoresis during the irinotecan infusion.  She says this is occurred with previous cycles.  She also had dysarthria.  She was seen by the symptom management provider and prescribed Pepcid.  She was able to complete the infusion.  She reports nausea had resolved by the evening of chemotherapy.  No recurrent nausea.  She had persistent cold sensitivity following this cycle of chemotherapy.  No neuropathy symptoms at present.  Abdominal pain has improved.  She is no longer using breakthrough pain medication.  She is having bowel movements.  Objective:  Vital signs in last 24 hours:  Blood pressure 134/73, pulse 62, temperature 97.9 F (36.6 C), temperature source Temporal, resp. rate 20, height 5\' 6"  (1.676 m), weight 138 lb 11.2 oz (62.9 kg), SpO2 100 %.    HEENT: No thrush, mild erythema at the posterior palate, no ulcers Resp: Scattered end inspiratory rhonchi, no respiratory distress Cardio: Regular rate and rhythm GI: No hepatosplenomegaly, nontender, no mass Vascular: No leg edema Neuro: Mild loss of vibratory sense at the fingertips bilaterally Skin: Palms without erythema  Portacath/PICC-without erythema  Lab Results:  Lab Results  Component Value Date   WBC 13.1 (H) 05/22/2020   HGB 9.9 (L) 05/22/2020   HCT 30.2 (L) 05/22/2020   MCV 94.4 05/22/2020   PLT 134 (L) 05/22/2020   NEUTROABS 10.2 (H) 05/22/2020    CMP  Lab Results  Component Value Date   NA 140 05/22/2020   K 4.3 05/22/2020   CL 107 05/22/2020   CO2 24 05/22/2020   GLUCOSE 102 (H) 05/22/2020   BUN 6 (L) 05/22/2020   CREATININE 0.74 05/22/2020   CALCIUM 8.8 (L) 05/22/2020   PROT 6.0 (L) 05/22/2020   ALBUMIN 3.1 (L) 05/22/2020   AST 15 05/22/2020   ALT 20 05/22/2020    ALKPHOS 148 (H) 05/22/2020   BILITOT 0.3 05/22/2020   GFRNONAA >60 05/22/2020   GFRAA >60 05/22/2020     Medications: I have reviewed the patient's current medications.   Assessment/Plan: 1. 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 MRI abdomen 02/01/2020-2.2 x 3.3 cm mass in the posterior pancreas head/uncinate process, mild intrahepatic/extrahepatic ductal dilatation, no evidence of vascular invasion, possible small lymph nodes in the porta hepatis-poorly visualized ? EUS 02/29/2020-20 x 23 mm mass in the pancreas head, upstream pancreatic duct dilatation, 1 abnormal peripancreatic node, uT3?uN1 ? 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 CTs 03/19/2020-2.9 x 2.1 x 3.1 cm pancreas head/uncinate mass, lesion associated with infrarenal abdominal aorta and superior mesenteric artery, prominent and borderline enlarged retroperitoneal nodes including a left periaortic node, indeterminate 4 mm lung nodule ? Cycle 1 mFOLFIRINOX 3/53/61, complicated by n/v and hospital admission 5/2/ - 5/3 ? Second opinion at [facility] with Dr. Earnestine Mealing and Dr. Mariah Milling 04/02/20  ? PET scan 04/03/2020-hypermetabolic poorly marginated pancreatic head mass; 2 hypermetabolic left periaortic lymph nodes, hypermetabolic left supraclavicular lymph node  ? Cycle 2 FOLFIRINOX 04/11/2020 ? Ultrasound-guided biopsy of a left supraclavicular mass on 04/18/2020-poorly differentiated adenocarcinoma, cytokeratin 7+, cytokeratin 20 and CDX2 positive in rare cells. ? Cycle 3 FOLFIRINOX 04/24/2020-oxaliplatin infusion lengthened to 3 hours, Udenyca was not given (ordered) ? Cycle 4 FOLFIRINOX 05/08/2020 ? Cycle 5 FOLFIRINOX 05/22/2020-atropine and Ativan premedication added secondary to cholinergic  symptoms and nausea from irinotecan  2. Abdomen/back pain secondary to #1 3. Anorexia/weight loss secondary to #1 4. History of coronary artery disease,STEMI with the fib arrest  2011, status post RCA stent 5. Constipation, secondary to #1 and morphine  6.   Hospital admission for n/v after cycle 1 FOLFIRINOX 5/2 - 5/3 --prophylactic dexamethasone beginning day 2, Decadron dose increased beginning with cycle 4 FOLFIRINOX 7.  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  Her pain is significantly improved.  She will complete cycle 5 today.  I suspect the symptoms she experienced during the chemotherapy infusion 2 weeks ago was largely related to irinotecan.  She will be premedicated with atropine and Ativan today.  Vicki [name] will return for an office visit in the next cycle of chemotherapy in 2 weeks.  She will undergo a restaging CT evaluation next week.  Betsy Coder, MD  05/22/2020  10:33 AM

## 2020-05-22 NOTE — Progress Notes (Signed)
Per Dr. Benay Spice, adding back atropine today due to cholinergic symptoms with last irinotecan infusion. Oxaliplatin remains over 3 hours for the thick tongue and slurred speech from oxaliplatin. She is also utilizing warm blankets. Lorazepam added for prevention of acute nausea that she experienced last cycle.   I spoke with patient regarding notes in her chart about stopping atropine previously due to side effects. She had some dry mouth. She is okay with trialing the medication again to avoid cholinergic symptoms from the irinotecan, which were more bothersome to her.   Demetrius Charity, PharmD, BCPS, [place] [facility] Pharmacist Pharmacy Phone: [phone] 05/22/2020

## 2020-05-23 ENCOUNTER — Telehealth: Payer: Self-pay | Admitting: Oncology

## 2020-05-23 NOTE — Telephone Encounter (Signed)
Scheduled per 6/23 los. Noted to give pt updated calendar.

## 2020-05-24 ENCOUNTER — Other Ambulatory Visit: Payer: Self-pay

## 2020-05-24 ENCOUNTER — Inpatient Hospital Stay: Payer: 59

## 2020-05-24 VITALS — BP 130/68 | HR 68 | Temp 98.1°F | Resp 18

## 2020-05-24 DIAGNOSIS — C250 Malignant neoplasm of head of pancreas: Secondary | ICD-10-CM

## 2020-05-24 DIAGNOSIS — Z5111 Encounter for antineoplastic chemotherapy: Secondary | ICD-10-CM | POA: Diagnosis not present

## 2020-05-24 LAB — CANCER ANTIGEN 19-9: CA 19-9: 40 U/mL — ABNORMAL HIGH (ref 0–35)

## 2020-05-24 MED ORDER — HEPARIN SOD (PORK) LOCK FLUSH 100 UNIT/ML IV SOLN
500.0000 [IU] | Freq: Once | INTRAVENOUS | Status: AC | PRN
  Administered 2020-05-24: 500 [IU]
  Filled 2020-05-24: qty 5

## 2020-05-24 MED ORDER — SODIUM CHLORIDE 0.9% FLUSH
10.0000 mL | INTRAVENOUS | Status: DC | PRN
  Administered 2020-05-24: 10 mL
  Filled 2020-05-24: qty 10

## 2020-05-24 MED ORDER — PEGFILGRASTIM-BMEZ 6 MG/0.6ML ~~LOC~~ SOSY
6.0000 mg | PREFILLED_SYRINGE | Freq: Once | SUBCUTANEOUS | Status: AC
  Administered 2020-05-24: 6 mg via SUBCUTANEOUS
  Filled 2020-05-24: qty 0.6

## 2020-05-24 NOTE — Patient Instructions (Signed)

## 2020-05-29 ENCOUNTER — Ambulatory Visit (HOSPITAL_COMMUNITY)
Admission: RE | Admit: 2020-05-29 | Discharge: 2020-05-29 | Disposition: A | Discharge status: Home / Self Care | Payer: 59 | Source: Ambulatory Visit | Attending: Oncology | Admitting: Oncology

## 2020-05-29 ENCOUNTER — Encounter: Payer: Self-pay | Admitting: *Deleted

## 2020-05-29 ENCOUNTER — Other Ambulatory Visit: Payer: Self-pay

## 2020-05-29 ENCOUNTER — Encounter (HOSPITAL_COMMUNITY): Payer: Self-pay

## 2020-05-29 DIAGNOSIS — C250 Malignant neoplasm of head of pancreas: Secondary | ICD-10-CM

## 2020-05-29 MED ORDER — IOHEXOL 300 MG/ML  SOLN
100.0000 mL | Freq: Once | INTRAMUSCULAR | Status: AC | PRN
  Administered 2020-05-29: 100 mL via INTRAVENOUS

## 2020-05-29 MED ORDER — IOHEXOL 9 MG/ML PO SOLN
500.0000 mL | ORAL | Status: AC
  Administered 2020-05-29: 500 mL via ORAL

## 2020-05-29 MED ORDER — IOHEXOL 9 MG/ML PO SOLN
ORAL | Status: AC
  Administered 2020-05-29: 500 mL via ORAL
  Filled 2020-05-29: qty 1000

## 2020-05-29 MED ORDER — SODIUM CHLORIDE (PF) 0.9 % IJ SOLN
INTRAMUSCULAR | Status: AC
  Filled 2020-05-29: qty 50

## 2020-05-29 NOTE — Progress Notes (Unsigned)
Completed forms w/disability dates 02/29/20 to ~ 08/14/20 (patient's request) w/scan, path, tx plan and office notes to [phone] att: Francine Graven.

## 2020-05-30 ENCOUNTER — Telehealth: Payer: Self-pay | Admitting: *Deleted

## 2020-05-30 NOTE — Telephone Encounter (Signed)
Called pt & informed per Dr Benay Spice that CT's look good/ pancreatic mass & lymph nodes are smaller.  No new sites of cancer.  Informed to try Magic mouthwash for mouth soreness.  She has some at home.  Pt pleased with results.

## 2020-05-30 NOTE — Telephone Encounter (Signed)
Received vm call from pt asking about test results from yesterday.  She reports having a hard time this time & she thinks it is coming off the steroids that seem to cause her problems.  She reports that every time she feels weak, tired, Mentally & emotionally off.  She states that her mouth is burning, feels raw, uncomfortable but no sores.  Taste is poor & she is not eating well for those reasons.  She ask if you have any recommendations.  Message to Dr Benay Spice.

## 2020-06-03 ENCOUNTER — Other Ambulatory Visit: Payer: Self-pay | Admitting: Oncology

## 2020-06-05 ENCOUNTER — Other Ambulatory Visit: Payer: Self-pay

## 2020-06-05 ENCOUNTER — Telehealth: Payer: Self-pay | Admitting: Oncology

## 2020-06-05 ENCOUNTER — Inpatient Hospital Stay: Payer: 59

## 2020-06-05 ENCOUNTER — Inpatient Hospital Stay: Payer: 59 | Admitting: Nutrition

## 2020-06-05 ENCOUNTER — Inpatient Hospital Stay: Payer: 59 | Attending: Oncology | Admitting: Oncology

## 2020-06-05 VITALS — BP 125/73 | HR 59

## 2020-06-05 VITALS — BP 111/74 | HR 66 | Temp 97.6°F | Resp 17 | Ht 66.0 in | Wt 132.9 lb

## 2020-06-05 DIAGNOSIS — C250 Malignant neoplasm of head of pancreas: Secondary | ICD-10-CM

## 2020-06-05 DIAGNOSIS — Z5189 Encounter for other specified aftercare: Secondary | ICD-10-CM | POA: Diagnosis not present

## 2020-06-05 DIAGNOSIS — Z5111 Encounter for antineoplastic chemotherapy: Secondary | ICD-10-CM | POA: Diagnosis not present

## 2020-06-05 LAB — CBC WITH DIFFERENTIAL (CANCER CENTER ONLY)
Abs Immature Granulocytes: 0.22 10*3/uL — ABNORMAL HIGH (ref 0.00–0.07)
Basophils Absolute: 0 10*3/uL (ref 0.0–0.1)
Basophils Relative: 0 %
Eosinophils Absolute: 0.2 10*3/uL (ref 0.0–0.5)
Eosinophils Relative: 2 %
HCT: 31.5 % — ABNORMAL LOW (ref 36.0–46.0)
Hemoglobin: 10.3 g/dL — ABNORMAL LOW (ref 12.0–15.0)
Immature Granulocytes: 2 %
Lymphocytes Relative: 17 %
Lymphs Abs: 2 10*3/uL (ref 0.7–4.0)
MCH: 30.4 pg (ref 26.0–34.0)
MCHC: 32.7 g/dL (ref 30.0–36.0)
MCV: 92.9 fL (ref 80.0–100.0)
Monocytes Absolute: 0.6 10*3/uL (ref 0.1–1.0)
Monocytes Relative: 5 %
Neutro Abs: 8.9 10*3/uL — ABNORMAL HIGH (ref 1.7–7.7)
Neutrophils Relative %: 74 %
Platelet Count: 241 10*3/uL (ref 150–400)
RBC: 3.39 MIL/uL — ABNORMAL LOW (ref 3.87–5.11)
RDW: 16.5 % — ABNORMAL HIGH (ref 11.5–15.5)
WBC Count: 12 10*3/uL — ABNORMAL HIGH (ref 4.0–10.5)
nRBC: 0.2 % (ref 0.0–0.2)

## 2020-06-05 LAB — CMP (CANCER CENTER ONLY)
ALT: 20 U/L (ref 0–44)
AST: 22 U/L (ref 15–41)
Albumin: 3.4 g/dL — ABNORMAL LOW (ref 3.5–5.0)
Alkaline Phosphatase: 133 U/L — ABNORMAL HIGH (ref 38–126)
Anion gap: 11 (ref 5–15)
BUN: 5 mg/dL — ABNORMAL LOW (ref 8–23)
CO2: 21 mmol/L — ABNORMAL LOW (ref 22–32)
Calcium: 8.9 mg/dL (ref 8.9–10.3)
Chloride: 109 mmol/L (ref 98–111)
Creatinine: 0.74 mg/dL (ref 0.44–1.00)
GFR, Est AFR Am: >60 mL/min (ref >60)
GFR, Estimated: >60 mL/min (ref >60)
Glucose, Bld: 111 mg/dL — ABNORMAL HIGH (ref 70–99)
Potassium: 3.5 mmol/L (ref 3.5–5.1)
Sodium: 141 mmol/L (ref 135–145)
Total Bilirubin: 0.3 mg/dL (ref 0.3–1.2)
Total Protein: 6 g/dL — ABNORMAL LOW (ref 6.5–8.1)

## 2020-06-05 LAB — MAGNESIUM: Magnesium: 1.5 mg/dL — ABNORMAL LOW (ref 1.7–2.4)

## 2020-06-05 MED ORDER — HEPARIN SOD (PORK) LOCK FLUSH 100 UNIT/ML IV SOLN
500.0000 [IU] | Freq: Once | INTRAVENOUS | Status: DC | PRN
  Filled 2020-06-05: qty 5

## 2020-06-05 MED ORDER — SODIUM CHLORIDE 0.9 % IV SOLN
150.0000 mg | Freq: Once | INTRAVENOUS | Status: AC
  Administered 2020-06-05: 150 mg via INTRAVENOUS
  Filled 2020-06-05: qty 150

## 2020-06-05 MED ORDER — MAGNESIUM SULFATE 2 GM/50ML IV SOLN
INTRAVENOUS | Status: AC
  Filled 2020-06-05: qty 50

## 2020-06-05 MED ORDER — ATROPINE SULFATE 0.4 MG/ML IJ SOLN
0.4000 mg | Freq: Once | INTRAMUSCULAR | Status: AC
  Administered 2020-06-05: 0.4 mg via INTRAVENOUS

## 2020-06-05 MED ORDER — PALONOSETRON HCL INJECTION 0.25 MG/5ML
INTRAVENOUS | Status: AC
  Filled 2020-06-05: qty 5

## 2020-06-05 MED ORDER — SODIUM CHLORIDE 0.9 % IV SOLN
10.0000 mg | Freq: Once | INTRAVENOUS | Status: AC
  Administered 2020-06-05: 10 mg via INTRAVENOUS
  Filled 2020-06-05: qty 1

## 2020-06-05 MED ORDER — ATROPINE SULFATE 1 MG/ML IJ SOLN: 0.4000 mg | Freq: Once | INTRAMUSCULAR | Status: DC

## 2020-06-05 MED ORDER — LORAZEPAM 2 MG/ML IJ SOLN
0.5000 mg | Freq: Once | INTRAMUSCULAR | Status: AC
  Administered 2020-06-05: 0.5 mg via INTRAVENOUS

## 2020-06-05 MED ORDER — ATROPINE SULFATE 0.4 MG/ML IJ SOLN
INTRAMUSCULAR | Status: AC
  Filled 2020-06-05: qty 1

## 2020-06-05 MED ORDER — DEXTROSE 5 % IV SOLN
Freq: Once | INTRAVENOUS | Status: AC
  Filled 2020-06-05: qty 250

## 2020-06-05 MED ORDER — SODIUM CHLORIDE 0.9 % IV SOLN
2000.0000 mg/m2 | INTRAVENOUS | Status: DC
  Administered 2020-06-05: 3400 mg via INTRAVENOUS
  Filled 2020-06-05: qty 68

## 2020-06-05 MED ORDER — MAGNESIUM SULFATE 2 GM/50ML IV SOLN
2.0000 g | Freq: Once | INTRAVENOUS | Status: AC
  Administered 2020-06-05: 2 g via INTRAVENOUS

## 2020-06-05 MED ORDER — PALONOSETRON HCL INJECTION 0.25 MG/5ML
0.2500 mg | Freq: Once | INTRAVENOUS | Status: AC
  Administered 2020-06-05: 0.25 mg via INTRAVENOUS

## 2020-06-05 MED ORDER — LORAZEPAM 2 MG/ML IJ SOLN
INTRAMUSCULAR | Status: AC
  Filled 2020-06-05: qty 1

## 2020-06-05 MED ORDER — SODIUM CHLORIDE 0.9% FLUSH
10.0000 mL | INTRAVENOUS | Status: DC | PRN
  Filled 2020-06-05: qty 10

## 2020-06-05 MED ORDER — OXALIPLATIN CHEMO INJECTION 100 MG/20ML
88.0000 mg/m2 | Freq: Once | INTRAVENOUS | Status: AC
  Administered 2020-06-05: 150 mg via INTRAVENOUS
  Filled 2020-06-05: qty 20

## 2020-06-05 MED ORDER — SODIUM CHLORIDE 0.9 % IV SOLN
400.0000 mg/m2 | Freq: Once | INTRAVENOUS | Status: AC
  Administered 2020-06-05: 676 mg via INTRAVENOUS
  Filled 2020-06-05: qty 33.8

## 2020-06-05 MED ORDER — SODIUM CHLORIDE 0.9 % IV SOLN
150.0000 mg/m2 | Freq: Once | INTRAVENOUS | Status: AC
  Administered 2020-06-05: 260 mg via INTRAVENOUS
  Filled 2020-06-05: qty 13

## 2020-06-05 NOTE — Progress Notes (Signed)
Per Dr. Benay Spice: OK to treat w/Mg+ 1.5. Pharmacy to order Mg+ replacement in fluids today.

## 2020-06-05 NOTE — Telephone Encounter (Signed)
Made no changes to pt's schedule per 7/6 los.

## 2020-06-05 NOTE — Progress Notes (Signed)
Per Susan/Dr. Benay Spice, patient requires magnesium replacement today. Give magnesium sulfate 2 grams IV x1 today for level of 1.5.  Demetrius Charity, PharmD, BCPS, [place] [facility] Pharmacist Pharmacy Phone: [phone] 06/05/2020

## 2020-06-05 NOTE — Patient Instructions (Signed)

## 2020-06-05 NOTE — Progress Notes (Signed)
[place] OFFICE PROGRESS NOTE   Diagnosis: Pancreas cancer  INTERVAL HISTORY:   [name] complete another cycle FOLFIRINOX 05/22/2020. No nausea or vomiting. She reports developing severe malaise after discontinuing Decadron. Her appetite is poor. She is drinking fluids. No neuropathy symptoms. No diarrhea. She did not have an acute reaction during the irinotecan infusion. Objective:  Vital signs in last 24 hours:  Blood pressure 111/74, pulse 66, temperature 97.6 F (36.4 C), temperature source Temporal, resp. rate 17, height 5\' 6"  (1.676 m), weight 132 lb 14.4 oz (60.3 kg), SpO2 100 %.    HEENT: No thrush or ulcers Resp: Scattered end inspiratory rhonchi Cardio: Regular rate and rhythm GI: No hepatomegaly, no mass, nontender Vascular: No leg edema Neuro: Very mild loss of vibratory sense at the fingertips bilaterally   Portacath/PICC-without erythema  Lab Results:  Lab Results  Component Value Date   WBC 12.0 (H) 06/05/2020   HGB 10.3 (L) 06/05/2020   HCT 31.5 (L) 06/05/2020   MCV 92.9 06/05/2020   PLT 241 06/05/2020   NEUTROABS 8.9 (H) 06/05/2020    CMP  Lab Results  Component Value Date   NA 141 06/05/2020   K 3.5 06/05/2020   CL 109 06/05/2020   CO2 21 (L) 06/05/2020   GLUCOSE 111 (H) 06/05/2020   BUN 5 (L) 06/05/2020   CREATININE 0.74 06/05/2020   CALCIUM 8.9 06/05/2020   PROT 6.0 (L) 06/05/2020   ALBUMIN 3.4 (L) 06/05/2020   AST 22 06/05/2020   ALT 20 06/05/2020   ALKPHOS 133 (H) 06/05/2020   BILITOT 0.3 06/05/2020   GFRNONAA >60 06/05/2020   GFRAA >60 06/05/2020     Medications: I have reviewed the patient's current medications.   Assessment/Plan: 1. 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 MRI abdomen 02/01/2020-2.2 x 3.3 cm mass in the posterior pancreas head/uncinate process, mild intrahepatic/extrahepatic ductal dilatation, no evidence of vascular invasion, possible small lymph nodes  in the porta hepatis-poorly visualized ? EUS 02/29/2020-20 x 23 mm mass in the pancreas head, upstream pancreatic duct dilatation, 1 abnormal peripancreatic node, uT3?uN1 ? 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 CTs 03/19/2020-2.9 x 2.1 x 3.1 cm pancreas head/uncinate mass, lesion associated with infrarenal abdominal aorta and superior mesenteric artery, prominent and borderline enlarged retroperitoneal nodes including a left periaortic node, indeterminate 4 mm lung nodule ? Cycle 1 mFOLFIRINOX 05/06/36, complicated by n/v and hospital admission 5/2/ - 5/3 ? [facility] opinion at [facility] with Dr. Earnestine Mealing and Dr. Mariah Milling 04/02/20  ? PET scan 04/03/2020-hypermetabolic poorly marginated pancreatic head mass; 2 hypermetabolic left periaortic lymph nodes, hypermetabolic left supraclavicular lymph node  ? Cycle 2 FOLFIRINOX 04/11/2020 ? Ultrasound-guided biopsy of a left supraclavicular mass on 04/18/2020-poorly differentiated adenocarcinoma, cytokeratin 7+, cytokeratin 20 and CDX2 positive in rare cells. ? Cycle 3 FOLFIRINOX 04/24/2020-oxaliplatin infusion lengthened to 3 hours, Udenyca was not given (ordered) ? Cycle 4 FOLFIRINOX 05/08/2020 ? Cycle 5 FOLFIRINOX 05/22/2020-atropine and Ativan premedication added secondary to cholinergic symptoms and nausea from irinotecan ? CTs 05/29/2020-decreased left supraclavicular lymph node, slight decrease in size of the pancreas head mass, decrease in retroperitoneal lymphadenopathy ? Cycle 6 FOLFIRINOX 06/05/2020  2. Abdomen/back pain secondary to #1 3. Anorexia/weight loss secondary to #1 4. History of coronary artery disease,STEMI with the fib arrest 2011, status post RCA stent 5. Constipation, secondary to #1 and morphine  6.   Hospital admission for n/v after cycle 1 FOLFIRINOX 5/2 - 5/3 --prophylactic dexamethasone  beginning day 2, Decadron dose increased beginning with cycle 4 FOLFIRINOX 7.  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 She has completed 5 cycles of FOLFIRINOX. The restaging CT reveals a decrease in the supraclavicular/retroperitoneal lymph nodes and pancreas mass. I reviewed the CT images with Ms. Top. The plan is to continue FOLFIRINOX. She will complete another cycle today.  Ms. Schroeck will return for an office visit and chemotherapy in 3 weeks.  She will hold lisinopril with the low blood pressure. She will decrease the Decadron dose as tolerated following this cycle.  Betsy Coder, MD  06/05/2020  11:20 AM

## 2020-06-05 NOTE — Progress Notes (Signed)
Nutrition follow-up completed with patient during infusion for pancreas cancer. Weight has decreased and was documented as 132.9 pounds on July 7 down from 138.7 pounds June 23. Patient is having increased difficulty eating. She found that she tolerated soups but most other foods did not sit well with her. She is drinking water with lemon with good success. She has not been able to tolerate cold liquids or cold foods until the very beginning of the next treatment.  Nutrition diagnosis: Unintended weight loss continues.  Intervention: Provided support and encouragement for patient to continue strategies for increased calories and protein. Reviewed foods that may be palatable and tolerated. Encouraged frequent small meals and snacks.  Monitoring, evaluation, goals: Patient will tolerate adequate calories and protein for minimal weight loss.  Next visit: Wednesday, July 28 during infusion.  **Disclaimer: This note was dictated with voice recognition software. Similar sounding words can inadvertently be transcribed and this note may contain transcription errors which may not have been corrected upon publication of note.**

## 2020-06-05 NOTE — Patient Instructions (Signed)
[facility] Discharge Instructions for Patients Receiving Chemotherapy  Today you received the following chemotherapy agents oxaliplatin, irinotecan, leucovorin, and fluorouracil.  To help prevent nausea and vomiting after your treatment, we encourage you to take your nausea medication as directed.   If you develop nausea and vomiting that is not controlled by your nausea medication, call the clinic.   BELOW ARE SYMPTOMS THAT SHOULD BE REPORTED IMMEDIATELY:  *FEVER GREATER THAN 100.5 F  *CHILLS WITH OR WITHOUT FEVER  NAUSEA AND VOMITING THAT IS NOT CONTROLLED WITH YOUR NAUSEA MEDICATION  *UNUSUAL SHORTNESS OF BREATH  *UNUSUAL BRUISING OR BLEEDING  TENDERNESS IN MOUTH AND THROAT WITH OR WITHOUT PRESENCE OF ULCERS  *URINARY PROBLEMS  *BOWEL PROBLEMS  UNUSUAL RASH Items with * indicate a potential emergency and should be followed up as soon as possible.  Feel free to call the clinic should you have any questions or concerns. The clinic phone number is [phone].  Please show the CHEMO ALERT CARD at check-in to the Emergency Department and triage nurse.   

## 2020-06-06 ENCOUNTER — Other Ambulatory Visit: Payer: Self-pay | Admitting: Oncology

## 2020-06-06 DIAGNOSIS — C250 Malignant neoplasm of head of pancreas: Secondary | ICD-10-CM

## 2020-06-06 LAB — CANCER ANTIGEN 19-9: CA 19-9: 25 U/mL (ref 0–35)

## 2020-06-06 MED ORDER — MORPHINE SULFATE ER 15 MG PO TBCR: 15.0000 mg | EXTENDED_RELEASE_TABLET | Freq: Two times a day (BID) | ORAL | 0 refills | Status: DC

## 2020-06-07 ENCOUNTER — Other Ambulatory Visit: Payer: Self-pay

## 2020-06-07 ENCOUNTER — Inpatient Hospital Stay: Payer: 59

## 2020-06-07 VITALS — BP 113/66 | HR 68 | Temp 98.4°F | Resp 18

## 2020-06-07 DIAGNOSIS — Z5111 Encounter for antineoplastic chemotherapy: Secondary | ICD-10-CM | POA: Diagnosis not present

## 2020-06-07 DIAGNOSIS — C250 Malignant neoplasm of head of pancreas: Secondary | ICD-10-CM

## 2020-06-07 MED ORDER — HEPARIN SOD (PORK) LOCK FLUSH 100 UNIT/ML IV SOLN
500.0000 [IU] | Freq: Once | INTRAVENOUS | Status: AC | PRN
  Administered 2020-06-07: 500 [IU]
  Filled 2020-06-07: qty 5

## 2020-06-07 MED ORDER — SODIUM CHLORIDE 0.9% FLUSH
10.0000 mL | INTRAVENOUS | Status: DC | PRN
  Administered 2020-06-07: 10 mL
  Filled 2020-06-07: qty 10

## 2020-06-07 MED ORDER — PEGFILGRASTIM-BMEZ 6 MG/0.6ML ~~LOC~~ SOSY
6.0000 mg | PREFILLED_SYRINGE | Freq: Once | SUBCUTANEOUS | Status: AC
  Administered 2020-06-07: 6 mg via SUBCUTANEOUS
  Filled 2020-06-07: qty 0.6

## 2020-06-10 ENCOUNTER — Telehealth: Payer: Self-pay | Admitting: *Deleted

## 2020-06-10 NOTE — Telephone Encounter (Signed)
Per [place] w/[employer] Disability. Asking if return to work date of 08/14/20 is "set". She will not qualify for disability unless she is out until 08/27/20. Per Dr. Benay Spice: The return date 08/14/20 was requested by patient, but he is doubtful she will be ready by then. Caryl Pina will fax form back for MD to change date to "undetermined".

## 2020-06-20 ENCOUNTER — Encounter: Payer: Self-pay | Admitting: *Deleted

## 2020-06-20 NOTE — Progress Notes (Signed)
Faxed completed attending physician statement and last office note to [employer], att: Francine Graven at [phone]. Original returned to [place], [place] to have scanned.

## 2020-06-23 ENCOUNTER — Other Ambulatory Visit: Payer: Self-pay | Admitting: Oncology

## 2020-06-24 ENCOUNTER — Other Ambulatory Visit: Payer: Self-pay | Admitting: Family Medicine

## 2020-06-25 ENCOUNTER — Telehealth: Payer: Self-pay | Admitting: Emergency Medicine

## 2020-06-25 NOTE — Telephone Encounter (Signed)
Returning pt's VM on triage line at [facility].  Pt reports headache for two days, generalized achiness, chills last night (none currently with no fever), and bilat feet swelling that started Sunday but has improved today.  Pt denies taking any additional pain medicine besides usual medications.  Pt advised to take Tylenol and triage RN will call back in an hour to see if pain has improved. Called pt back, pt reports headache has improved but aches are still present.  Pt states she was taken off BP meds by MD Benay Spice recently, does not have a way to check BP at home.  Per MD Benay Spice pt advised to come tomorrow for lab/MD/infusion appts as planned where she can be assessed further.  Pt agreed to plan and advisement to call back beforehand if anything worsens/changes and to hydrate and monitor temp/symptoms until then.

## 2020-06-26 ENCOUNTER — Inpatient Hospital Stay: Payer: 59

## 2020-06-26 ENCOUNTER — Inpatient Hospital Stay (HOSPITAL_BASED_OUTPATIENT_CLINIC_OR_DEPARTMENT_OTHER): Payer: 59 | Admitting: Oncology

## 2020-06-26 ENCOUNTER — Other Ambulatory Visit: Payer: Self-pay

## 2020-06-26 ENCOUNTER — Inpatient Hospital Stay: Payer: 59 | Admitting: Nutrition

## 2020-06-26 VITALS — BP 121/78 | HR 62 | Temp 97.3°F | Resp 16 | Ht 66.0 in | Wt 134.0 lb

## 2020-06-26 DIAGNOSIS — Z5111 Encounter for antineoplastic chemotherapy: Secondary | ICD-10-CM | POA: Diagnosis not present

## 2020-06-26 DIAGNOSIS — C250 Malignant neoplasm of head of pancreas: Secondary | ICD-10-CM | POA: Diagnosis not present

## 2020-06-26 LAB — CMP (CANCER CENTER ONLY)
ALT: 37 U/L (ref 0–44)
AST: 37 U/L (ref 15–41)
Albumin: 3.1 g/dL — ABNORMAL LOW (ref 3.5–5.0)
Alkaline Phosphatase: 232 U/L — ABNORMAL HIGH (ref 38–126)
Anion gap: 8 (ref 5–15)
BUN: 8 mg/dL (ref 8–23)
CO2: 24 mmol/L (ref 22–32)
Calcium: 9.1 mg/dL (ref 8.9–10.3)
Chloride: 108 mmol/L (ref 98–111)
Creatinine: 0.62 mg/dL (ref 0.44–1.00)
GFR, Est AFR Am: >60 mL/min (ref >60)
GFR, Estimated: >60 mL/min (ref >60)
Glucose, Bld: 130 mg/dL — ABNORMAL HIGH (ref 70–99)
Potassium: 3.8 mmol/L (ref 3.5–5.1)
Sodium: 140 mmol/L (ref 135–145)
Total Bilirubin: 0.4 mg/dL (ref 0.3–1.2)
Total Protein: 5.9 g/dL — ABNORMAL LOW (ref 6.5–8.1)

## 2020-06-26 LAB — CBC WITH DIFFERENTIAL (CANCER CENTER ONLY)
Abs Immature Granulocytes: 0.09 10*3/uL — ABNORMAL HIGH (ref 0.00–0.07)
Basophils Absolute: 0 10*3/uL (ref 0.0–0.1)
Basophils Relative: 0 %
Eosinophils Absolute: 0.1 10*3/uL (ref 0.0–0.5)
Eosinophils Relative: 1 %
HCT: 32.2 % — ABNORMAL LOW (ref 36.0–46.0)
Hemoglobin: 10.3 g/dL — ABNORMAL LOW (ref 12.0–15.0)
Immature Granulocytes: 1 %
Lymphocytes Relative: 11 %
Lymphs Abs: 1 10*3/uL (ref 0.7–4.0)
MCH: 30.7 pg (ref 26.0–34.0)
MCHC: 32 g/dL (ref 30.0–36.0)
MCV: 95.8 fL (ref 80.0–100.0)
Monocytes Absolute: 0.8 10*3/uL (ref 0.1–1.0)
Monocytes Relative: 8 %
Neutro Abs: 7 10*3/uL (ref 1.7–7.7)
Neutrophils Relative %: 79 %
Platelet Count: 269 10*3/uL (ref 150–400)
RBC: 3.36 MIL/uL — ABNORMAL LOW (ref 3.87–5.11)
RDW: 18 % — ABNORMAL HIGH (ref 11.5–15.5)
WBC Count: 9 10*3/uL (ref 4.0–10.5)
nRBC: 0 % (ref 0.0–0.2)

## 2020-06-26 LAB — MAGNESIUM: Magnesium: 1.7 mg/dL (ref 1.7–2.4)

## 2020-06-26 MED ORDER — PALONOSETRON HCL INJECTION 0.25 MG/5ML
0.2500 mg | Freq: Once | INTRAVENOUS | Status: AC
  Administered 2020-06-26: 0.25 mg via INTRAVENOUS

## 2020-06-26 MED ORDER — ATROPINE SULFATE 1 MG/ML IJ SOLN
INTRAMUSCULAR | Status: AC
  Filled 2020-06-26: qty 1

## 2020-06-26 MED ORDER — OXALIPLATIN CHEMO INJECTION 100 MG/20ML
88.0000 mg/m2 | Freq: Once | INTRAVENOUS | Status: AC
  Administered 2020-06-26: 150 mg via INTRAVENOUS
  Filled 2020-06-26: qty 30

## 2020-06-26 MED ORDER — LORAZEPAM 2 MG/ML IJ SOLN
INTRAMUSCULAR | Status: AC
  Filled 2020-06-26: qty 1

## 2020-06-26 MED ORDER — LORAZEPAM 2 MG/ML IJ SOLN
0.5000 mg | Freq: Once | INTRAMUSCULAR | Status: AC
  Administered 2020-06-26: 0.5 mg via INTRAVENOUS

## 2020-06-26 MED ORDER — SODIUM CHLORIDE 0.9 % IV SOLN
150.0000 mg | Freq: Once | INTRAVENOUS | Status: AC
  Administered 2020-06-26: 150 mg via INTRAVENOUS
  Filled 2020-06-26: qty 150

## 2020-06-26 MED ORDER — SODIUM CHLORIDE 0.9 % IV SOLN
10.0000 mg | Freq: Once | INTRAVENOUS | Status: AC
  Administered 2020-06-26: 10 mg via INTRAVENOUS
  Filled 2020-06-26: qty 10

## 2020-06-26 MED ORDER — SODIUM CHLORIDE 0.9 % IV SOLN
150.0000 mg/m2 | Freq: Once | INTRAVENOUS | Status: AC
  Administered 2020-06-26: 260 mg via INTRAVENOUS
  Filled 2020-06-26: qty 13

## 2020-06-26 MED ORDER — SODIUM CHLORIDE 0.9 % IV SOLN
2000.0000 mg/m2 | INTRAVENOUS | Status: DC
  Administered 2020-06-26: 3400 mg via INTRAVENOUS
  Filled 2020-06-26: qty 68

## 2020-06-26 MED ORDER — SODIUM CHLORIDE 0.9 % IV SOLN
400.0000 mg/m2 | Freq: Once | INTRAVENOUS | Status: AC
  Administered 2020-06-26: 676 mg via INTRAVENOUS
  Filled 2020-06-26: qty 33.8

## 2020-06-26 MED ORDER — DEXAMETHASONE 4 MG PO TABS: 8.0000 mg | ORAL_TABLET | Freq: Two times a day (BID) | ORAL | 1 refills | Status: DC

## 2020-06-26 MED ORDER — DEXTROSE 5 % IV SOLN
Freq: Once | INTRAVENOUS | Status: AC
  Filled 2020-06-26: qty 250

## 2020-06-26 MED ORDER — ATROPINE SULFATE 1 MG/ML IJ SOLN
0.4000 mg | Freq: Once | INTRAMUSCULAR | Status: AC
  Administered 2020-06-26: 0.4 mg via INTRAVENOUS

## 2020-06-26 MED ORDER — PALONOSETRON HCL INJECTION 0.25 MG/5ML
INTRAVENOUS | Status: AC
  Filled 2020-06-26: qty 5

## 2020-06-26 NOTE — Patient Instructions (Signed)
[facility] Discharge Instructions for Patients Receiving Chemotherapy  Today you received the following chemotherapy agents Oxaliplatin, Leucovorin, Irinotecan and Adrucil  To help prevent nausea and vomiting after your treatment, we encourage you to take your nausea medication as directed.   If you develop nausea and vomiting that is not controlled by your nausea medication, call the clinic.   BELOW ARE SYMPTOMS THAT SHOULD BE REPORTED IMMEDIATELY:  *FEVER GREATER THAN 100.5 F  *CHILLS WITH OR WITHOUT FEVER  NAUSEA AND VOMITING THAT IS NOT CONTROLLED WITH YOUR NAUSEA MEDICATION  *UNUSUAL SHORTNESS OF BREATH  *UNUSUAL BRUISING OR BLEEDING  TENDERNESS IN MOUTH AND THROAT WITH OR WITHOUT PRESENCE OF ULCERS  *URINARY PROBLEMS  *BOWEL PROBLEMS  UNUSUAL RASH Items with * indicate a potential emergency and should be followed up as soon as possible.  Feel free to call the clinic should you have any questions or concerns. The clinic phone number is [phone].  Please show the CHEMO ALERT CARD at check-in to the Emergency Department and triage nurse.   

## 2020-06-26 NOTE — Progress Notes (Signed)
Brief nutrition follow-up completed with patient during infusion for pancreas cancer. Patient was just given Ativan and is very sleepy. Weight improved and documented as 134 pounds increased from 132.9 pounds.   Noted bilateral foot swelling with trace pitting edema. Patient reports she feels better than she has. Continues to try to increase oral intake.  Nutrition diagnosis: Unintended weight loss improved.  Intervention:  Encourage patient to continue strategies for increasing calories and protein. Recommended small frequent meals and snacks. Provided support and encouragement.  Monitoring, evaluation, goals: Patient will continue to try to increase oral intake to minimize weight loss.  Next visit: To be scheduled as needed.  **Disclaimer: This note was dictated with voice recognition software. Similar sounding words can inadvertently be transcribed and this note may contain transcription errors which may not have been corrected upon publication of note.**

## 2020-06-26 NOTE — Progress Notes (Signed)
[place] OFFICE PROGRESS NOTE   Diagnosis: Pancreas cancer  INTERVAL HISTORY:   Vicki [name] returns as scheduled.  She complete another cycle of FOLFIRINOX on 06/05/2020.  She reports feeling the best after this cycle of chemotherapy compared to all of the previous cycles.  No nausea/vomiting.  No mouth sores.  Mild diarrhea and constipation.  She has noted a knot at the left thigh.  No erythema or pain.  She has bilateral foot swelling.  [name] reports good pain control with MS Contin.  She takes oxycodone occasionally.  She took a trip to the beach.  For the past 2 days she had chills and a mild headache.  No fever.  No cough.  She has persistent cold sensitivity.  She has mild numbness of the fingers.  The numbness does not interfere with activity.  Objective:  Vital signs in last 24 hours:  Blood pressure 121/78, pulse 62, temperature (!) 97.3 F (36.3 C), temperature source Temporal, resp. rate 16, height 5\' 6"  (1.676 m), weight 134 lb (60.8 kg), SpO2 100 %.    HEENT: No thrush or ulcers Resp: Scattered end inspiratory rhonchi, no respiratory distress Cardio: Regular rate and rhythm GI: No mass, nontender, no hepatomegaly Vascular: Trace pitting edema at the foot bilaterally.  There is collection of superficial varicosities at the low medial left thigh.  No erythema.  No palpable cord. Neuro: Mild to moderate loss of vibratory sense at the fingertips bilaterally Skin: No rash  Portacath/PICC-without erythema  Lab Results:  Lab Results  Component Value Date   WBC 9.0 06/26/2020   HGB 10.3 (L) 06/26/2020   HCT 32.2 (L) 06/26/2020   MCV 95.8 06/26/2020   PLT 269 06/26/2020   NEUTROABS 7.0 06/26/2020    CMP  Lab Results  Component Value Date   NA 140 06/26/2020   K 3.8 06/26/2020   CL 108 06/26/2020   CO2 24 06/26/2020   GLUCOSE 130 (H) 06/26/2020   BUN 8 06/26/2020   CREATININE 0.62 06/26/2020   CALCIUM 9.1 06/26/2020   PROT 5.9 (L)  06/26/2020   ALBUMIN 3.1 (L) 06/26/2020   AST 37 06/26/2020   ALT 37 06/26/2020   ALKPHOS 232 (H) 06/26/2020   BILITOT 0.4 06/26/2020   GFRNONAA >60 06/26/2020   GFRAA >60 06/26/2020     Medications: I have reviewed the patient's current medications.   Assessment/Plan: 1. 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 MRI abdomen 02/01/2020-2.2 x 3.3 cm mass in the posterior pancreas head/uncinate process, mild intrahepatic/extrahepatic ductal dilatation, no evidence of vascular invasion, possible small lymph nodes in the porta hepatis-poorly visualized ? EUS 02/29/2020-20 x 23 mm mass in the pancreas head, upstream pancreatic duct dilatation, 1 abnormal peripancreatic node, uT3?uN1 ? 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 CTs 03/19/2020-2.9 x 2.1 x 3.1 cm pancreas head/uncinate mass, lesion associated with infrarenal abdominal aorta and superior mesenteric artery, prominent and borderline enlarged retroperitoneal nodes including a left periaortic node, indeterminate 4 mm lung nodule ? Cycle 1 mFOLFIRINOX 8/41/32, complicated by n/v and hospital admission 5/2/ - 5/3 ? [facility] opinion at [facility] with Dr. Earnestine Mealing and Dr. Mariah Milling 04/02/20  ? PET scan 04/03/2020-hypermetabolic poorly marginated pancreatic head mass; 2 hypermetabolic left periaortic lymph nodes, hypermetabolic left supraclavicular lymph node  ? Cycle 2 FOLFIRINOX 04/11/2020 ? Ultrasound-guided biopsy of a left supraclavicular mass on 04/18/2020-poorly differentiated adenocarcinoma, cytokeratin 7+, cytokeratin 20 and CDX2 positive in rare cells. ? Cycle  3 FOLFIRINOX 04/24/2020-oxaliplatin infusion lengthened to 3 hours, Udenyca was not given (ordered) ? Cycle 4 FOLFIRINOX 05/08/2020 ? Cycle 5 FOLFIRINOX 05/22/2020-atropine and Ativan premedication added secondary to cholinergic symptoms and nausea from irinotecan ? CTs 05/29/2020-decreased left supraclavicular lymph node, slight  decrease in size of the pancreas head mass, decrease in retroperitoneal lymphadenopathy ? Cycle 6 FOLFIRINOX 06/05/2020 ? Cycle 7 FOLFIRINOX 06/26/2020  2. Abdomen/back pain secondary to #1 3. Anorexia/weight loss secondary to #1 4. History of coronary artery disease,STEMI with the fib arrest 2011, status post RCA stent 5. Constipation, secondary to #1 and morphine  6.   Hospital admission for n/v after cycle 1 FOLFIRINOX 5/2 - 5/3 --prophylactic dexamethasone beginning day 2, Decadron dose increased beginning with cycle 4 FOLFIRINOX 7.  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 has completed 6 cycles of FOLFIRINOX.  She is tolerating the chemotherapy well.  She will complete cycle 7 today.  The plan is to schedule restaging CTs after cycle 10.  She is developing oxaliplatin neuropathy.  We decided to continue oxaliplatin with cycle 7.  She understands the neuropathy symptoms could worsen.  We will plan to discontinue oxaliplatin after this cycle if the neuropathy symptoms persist.  She appears to have superficial varicosities at the left thigh.  She has a high risk of superficial and deep vein thrombosis.  She will call for pain, erythema, or leg swelling.  Ms. Noguchi will return for an office visit in the next cycle of chemotherapy in 2 weeks.  She is tapering the postchemotherapy Decadron prophylaxis as tolerated.  The etiology of the chills and headaches the past 2 days is unclear.  She has no other symptoms of a systemic infection.  She will call for a fever or progressive symptoms.  Betsy Coder, MD  06/26/2020  10:37 AM

## 2020-06-27 ENCOUNTER — Encounter: Payer: Self-pay | Admitting: *Deleted

## 2020-06-27 LAB — CANCER ANTIGEN 19-9: CA 19-9: 25 U/mL (ref 0–35)

## 2020-06-27 NOTE — Progress Notes (Signed)
Received request for records from 08/31/19 to 12/01/19 to be sent to [employer] within 14 days. Forwarded to HIM, who forwarded to [place]. Sent email to [employer] in HIM and requested assistance to expedite the request.

## 2020-06-28 ENCOUNTER — Telehealth: Payer: Self-pay | Admitting: Oncology

## 2020-06-28 ENCOUNTER — Other Ambulatory Visit: Payer: Self-pay

## 2020-06-28 ENCOUNTER — Inpatient Hospital Stay: Payer: 59

## 2020-06-28 VITALS — BP 120/71 | HR 66 | Temp 98.0°F | Resp 18

## 2020-06-28 DIAGNOSIS — Z5111 Encounter for antineoplastic chemotherapy: Secondary | ICD-10-CM | POA: Diagnosis not present

## 2020-06-28 DIAGNOSIS — C250 Malignant neoplasm of head of pancreas: Secondary | ICD-10-CM

## 2020-06-28 MED ORDER — PEGFILGRASTIM-BMEZ 6 MG/0.6ML ~~LOC~~ SOSY
6.0000 mg | PREFILLED_SYRINGE | Freq: Once | SUBCUTANEOUS | Status: AC
  Administered 2020-06-28: 6 mg via SUBCUTANEOUS
  Filled 2020-06-28: qty 0.6

## 2020-06-28 MED ORDER — SODIUM CHLORIDE 0.9% FLUSH
10.0000 mL | INTRAVENOUS | Status: DC | PRN
  Administered 2020-06-28: 10 mL
  Filled 2020-06-28: qty 10

## 2020-06-28 MED ORDER — HEPARIN SOD (PORK) LOCK FLUSH 100 UNIT/ML IV SOLN
500.0000 [IU] | Freq: Once | INTRAVENOUS | Status: AC | PRN
  Administered 2020-06-28: 500 [IU]
  Filled 2020-06-28: qty 5

## 2020-06-28 NOTE — Patient Instructions (Signed)
Pegfilgrastim injection What is this medicine? PEGFILGRASTIM (PEG fil gra stim) is a long-acting granulocyte colony-stimulating factor that stimulates the growth of neutrophils, a type of white blood cell important in the body's fight against infection. It is used to reduce the incidence of fever and infection in patients with certain types of cancer who are receiving chemotherapy that affects the bone marrow, and to increase survival after being exposed to high doses of radiation. This medicine may be used for other purposes; ask your health care provider or pharmacist if you have questions. COMMON BRAND NAME(S): Fulphila, Neulasta, UDENYCA, Ziextenzo What should I tell my health care provider before I take this medicine? They need to know if you have any of these conditions:  kidney disease  latex allergy  ongoing radiation therapy  sickle cell disease  skin reactions to acrylic adhesives (On-Body Injector only)  an unusual or allergic reaction to pegfilgrastim, filgrastim, other medicines, foods, dyes, or preservatives  pregnant or trying to get pregnant  breast-feeding How should I use this medicine? This medicine is for injection under the skin. If you get this medicine at home, you will be taught how to prepare and give the pre-filled syringe or how to use the On-body Injector. Refer to the patient Instructions for Use for detailed instructions. Use exactly as directed. Tell your healthcare provider immediately if you suspect that the On-body Injector may not have performed as intended or if you suspect the use of the On-body Injector resulted in a missed or partial dose. It is important that you put your used needles and syringes in a special sharps container. Do not put them in a trash can. If you do not have a sharps container, call your pharmacist or healthcare provider to get one. Talk to your pediatrician regarding the use of this medicine in children. While this drug may be  prescribed for selected conditions, precautions do apply. Overdosage: If you think you have taken too much of this medicine contact a poison control center or emergency room at once. NOTE: This medicine is only for you. Do not share this medicine with others. What if I miss a dose? It is important not to miss your dose. Call your doctor or health care professional if you miss your dose. If you miss a dose due to an On-body Injector failure or leakage, a new dose should be administered as soon as possible using a single prefilled syringe for manual use. What may interact with this medicine? Interactions have not been studied. Give your health care provider a list of all the medicines, herbs, non-prescription drugs, or dietary supplements you use. Also tell them if you smoke, drink alcohol, or use illegal drugs. Some items may interact with your medicine. This list may not describe all possible interactions. Give your health care provider a list of all the medicines, herbs, non-prescription drugs, or dietary supplements you use. Also tell them if you smoke, drink alcohol, or use illegal drugs. Some items may interact with your medicine. What should I watch for while using this medicine? You may need blood work done while you are taking this medicine. If you are going to need a MRI, CT scan, or other procedure, tell your doctor that you are using this medicine (On-Body Injector only). What side effects may I notice from receiving this medicine? Side effects that you should report to your doctor or health care professional as soon as possible:  allergic reactions like skin rash, itching or hives, swelling of the   face, lips, or tongue  back pain  dizziness  fever  pain, redness, or irritation at site where injected  pinpoint red spots on the skin  red or dark-brown urine  shortness of breath or breathing problems  stomach or side pain, or pain at the  shoulder  swelling  tiredness  trouble passing urine or change in the amount of urine Side effects that usually do not require medical attention (report to your doctor or health care professional if they continue or are bothersome):  bone pain  muscle pain This list may not describe all possible side effects. Call your doctor for medical advice about side effects. You may report side effects to FDA at 1-800-FDA-1088. Where should I keep my medicine? Keep out of the reach of children. If you are using this medicine at home, you will be instructed on how to store it. Throw away any unused medicine after the expiration date on the label. NOTE: This sheet is a summary. It may not cover all possible information. If you have questions about this medicine, talk to your doctor, pharmacist, or health care provider.  2020 Elsevier/Gold Standard (2018-02-21 16:57:08) Implanted Port Home Guide An implanted port is a device that is placed under the skin. It is usually placed in the chest. The device can be used to give IV medicine, to take blood, or for dialysis. You may have an implanted port if:  You need IV medicine that would be irritating to the small veins in your hands or arms.  You need IV medicines, such as antibiotics, for a long period of time.  You need IV nutrition for a long period of time.  You need dialysis. Having a port means that your health care provider will not need to use the veins in your arms for these procedures. You may have fewer limitations when using a port than you would if you used other types of long-term IVs, and you will likely be able to return to normal activities after your incision heals. An implanted port has two main parts:  Reservoir. The reservoir is the part where a needle is inserted to give medicines or draw blood. The reservoir is round. After it is placed, it appears as a small, raised area under your skin.  Catheter. The catheter is a thin,  flexible tube that connects the reservoir to a vein. Medicine that is inserted into the reservoir goes into the catheter and then into the vein. How is my port accessed? To access your port:  A numbing cream may be placed on the skin over the port site.  Your health care provider will put on a mask and sterile gloves.  The skin over your port will be cleaned carefully with a germ-killing soap and allowed to dry.  Your health care provider will gently pinch the port and insert a needle into it.  Your health care provider will check for a blood return to make sure the port is in the vein and is not clogged.  If your port needs to remain accessed to get medicine continuously (constant infusion), your health care provider will place a clear bandage (dressing) over the needle site. The dressing and needle will need to be changed every week, or as told by your health care provider. What is flushing? Flushing helps keep the port from getting clogged. Follow instructions from your health care provider about how and when to flush the port. Ports are usually flushed with saline solution or a medicine   called heparin. The need for flushing will depend on how the port is used:  If the port is only used from time to time to give medicines or draw blood, the port may need to be flushed: ? Before and after medicines have been given. ? Before and after blood has been drawn. ? As part of routine maintenance. Flushing may be recommended every 4-6 weeks.  If a constant infusion is running, the port may not need to be flushed.  Throw away any syringes in a disposal container that is meant for sharp items (sharps container). You can buy a sharps container from a pharmacy, or you can make one by using an empty hard plastic bottle with a cover. How long will my port stay implanted? The port can stay in for as long as your health care provider thinks it is needed. When it is time for the port to come out, a  surgery will be done to remove it. The surgery will be similar to the procedure that was done to put the port in. Follow these instructions at home:   Flush your port as told by your health care provider.  If you need an infusion over several days, follow instructions from your health care provider about how to take care of your port site. Make sure you: ? Wash your hands with soap and water before you change your dressing. If soap and water are not available, use alcohol-based hand sanitizer. ? Change your dressing as told by your health care provider. ? Place any used dressings or infusion bags into a plastic bag. Throw that bag in the trash. ? Keep the dressing that covers the needle clean and dry. Do not get it wet. ? Do not use scissors or sharp objects near the tube. ? Keep the tube clamped, unless it is being used.  Check your port site every day for signs of infection. Check for: ? Redness, swelling, or pain. ? Fluid or blood. ? Pus or a bad smell.  Protect the skin around the port site. ? Avoid wearing bra straps that rub or irritate the site. ? Protect the skin around your port from seat belts. Place a soft pad over your chest if needed.  Bathe or shower as told by your health care provider. The site may get wet as long as you are not actively receiving an infusion.  Return to your normal activities as told by your health care provider. Ask your health care provider what activities are safe for you.  Carry a medical alert card or wear a medical alert bracelet at all times. This will let health care providers know that you have an implanted port in case of an emergency. Get help right away if:  You have redness, swelling, or pain at the port site.  You have fluid or blood coming from your port site.  You have pus or a bad smell coming from the port site.  You have a fever. Summary  Implanted ports are usually placed in the chest for long-term IV access.  Follow  instructions from your health care provider about flushing the port and changing bandages (dressings).  Take care of the area around your port by avoiding clothing that puts pressure on the area, and by watching for signs of infection.  Protect the skin around your port from seat belts. Place a soft pad over your chest if needed.  Get help right away if you have a fever or you have redness,   swelling, pain, drainage, or a bad smell at the port site. This information is not intended to replace advice given to you by your health care provider. Make sure you discuss any questions you have with your health care provider. Document Revised: 03/10/2019 Document Reviewed: 12/19/2016 Elsevier Patient Education  2020 Elsevier Inc.  

## 2020-06-28 NOTE — Telephone Encounter (Signed)
Scheduled appts per 7/28 los. Pt confirmed appt date and time.

## 2020-07-03 ENCOUNTER — Other Ambulatory Visit: Payer: Self-pay | Admitting: Oncology

## 2020-07-06 ENCOUNTER — Other Ambulatory Visit: Payer: Self-pay | Admitting: Family Medicine

## 2020-07-11 ENCOUNTER — Other Ambulatory Visit: Payer: Self-pay

## 2020-07-11 ENCOUNTER — Inpatient Hospital Stay: Payer: 59 | Attending: Oncology

## 2020-07-11 ENCOUNTER — Inpatient Hospital Stay: Payer: 59

## 2020-07-11 ENCOUNTER — Telehealth: Payer: Self-pay | Admitting: Oncology

## 2020-07-11 ENCOUNTER — Inpatient Hospital Stay (HOSPITAL_BASED_OUTPATIENT_CLINIC_OR_DEPARTMENT_OTHER): Payer: 59 | Admitting: Oncology

## 2020-07-11 VITALS — BP 115/80 | HR 68 | Temp 96.3°F | Resp 17 | Ht 66.0 in | Wt 129.8 lb

## 2020-07-11 DIAGNOSIS — C250 Malignant neoplasm of head of pancreas: Secondary | ICD-10-CM | POA: Insufficient documentation

## 2020-07-11 DIAGNOSIS — Z5111 Encounter for antineoplastic chemotherapy: Secondary | ICD-10-CM | POA: Insufficient documentation

## 2020-07-11 DIAGNOSIS — Z5189 Encounter for other specified aftercare: Secondary | ICD-10-CM | POA: Insufficient documentation

## 2020-07-11 DIAGNOSIS — Z95828 Presence of other vascular implants and grafts: Secondary | ICD-10-CM

## 2020-07-11 LAB — CBC WITH DIFFERENTIAL (CANCER CENTER ONLY)
Abs Immature Granulocytes: 0.19 10*3/uL — ABNORMAL HIGH (ref 0.00–0.07)
Basophils Absolute: 0.1 10*3/uL (ref 0.0–0.1)
Basophils Relative: 1 %
Eosinophils Absolute: 0.2 10*3/uL (ref 0.0–0.5)
Eosinophils Relative: 2 %
HCT: 32.9 % — ABNORMAL LOW (ref 36.0–46.0)
Hemoglobin: 10.5 g/dL — ABNORMAL LOW (ref 12.0–15.0)
Immature Granulocytes: 2 %
Lymphocytes Relative: 17 %
Lymphs Abs: 2.2 10*3/uL (ref 0.7–4.0)
MCH: 29.9 pg (ref 26.0–34.0)
MCHC: 31.9 g/dL (ref 30.0–36.0)
MCV: 93.7 fL (ref 80.0–100.0)
Monocytes Absolute: 0.6 10*3/uL (ref 0.1–1.0)
Monocytes Relative: 5 %
Neutro Abs: 9.4 10*3/uL — ABNORMAL HIGH (ref 1.7–7.7)
Neutrophils Relative %: 73 %
Platelet Count: 239 10*3/uL (ref 150–400)
RBC: 3.51 MIL/uL — ABNORMAL LOW (ref 3.87–5.11)
RDW: 17.1 % — ABNORMAL HIGH (ref 11.5–15.5)
WBC Count: 12.7 10*3/uL — ABNORMAL HIGH (ref 4.0–10.5)
nRBC: 0 % (ref 0.0–0.2)

## 2020-07-11 LAB — CMP (CANCER CENTER ONLY)
ALT: 12 U/L (ref 0–44)
AST: 17 U/L (ref 15–41)
Albumin: 3.4 g/dL — ABNORMAL LOW (ref 3.5–5.0)
Alkaline Phosphatase: 173 U/L — ABNORMAL HIGH (ref 38–126)
Anion gap: 11 (ref 5–15)
BUN: 8 mg/dL (ref 8–23)
CO2: 23 mmol/L (ref 22–32)
Calcium: 9.6 mg/dL (ref 8.9–10.3)
Chloride: 108 mmol/L (ref 98–111)
Creatinine: 0.66 mg/dL (ref 0.44–1.00)
GFR, Est AFR Am: >60 mL/min (ref >60)
GFR, Estimated: >60 mL/min (ref >60)
Glucose, Bld: 90 mg/dL (ref 70–99)
Potassium: 4.1 mmol/L (ref 3.5–5.1)
Sodium: 142 mmol/L (ref 135–145)
Total Bilirubin: 0.2 mg/dL — ABNORMAL LOW (ref 0.3–1.2)
Total Protein: 6.4 g/dL — ABNORMAL LOW (ref 6.5–8.1)

## 2020-07-11 LAB — MAGNESIUM: Magnesium: 1.6 mg/dL — ABNORMAL LOW (ref 1.7–2.4)

## 2020-07-11 MED ORDER — SODIUM CHLORIDE 0.9 % IV SOLN
10.0000 mg | Freq: Once | INTRAVENOUS | Status: AC
  Administered 2020-07-11: 10 mg via INTRAVENOUS
  Filled 2020-07-11: qty 10

## 2020-07-11 MED ORDER — LORAZEPAM 2 MG/ML IJ SOLN
0.5000 mg | Freq: Once | INTRAMUSCULAR | Status: AC
  Administered 2020-07-11: 0.5 mg via INTRAVENOUS

## 2020-07-11 MED ORDER — SODIUM CHLORIDE 0.9 % IV SOLN
150.0000 mg | Freq: Once | INTRAVENOUS | Status: AC
  Administered 2020-07-11: 150 mg via INTRAVENOUS
  Filled 2020-07-11: qty 150

## 2020-07-11 MED ORDER — ATROPINE SULFATE 0.4 MG/ML IJ SOLN
0.4000 mg | Freq: Once | INTRAMUSCULAR | Status: AC
  Administered 2020-07-11: 0.4 mg via INTRAVENOUS

## 2020-07-11 MED ORDER — ATROPINE SULFATE 1 MG/ML IJ SOLN: 0.4000 mg | Freq: Once | INTRAMUSCULAR | Status: DC

## 2020-07-11 MED ORDER — HEPARIN SOD (PORK) LOCK FLUSH 100 UNIT/ML IV SOLN
500.0000 [IU] | Freq: Once | INTRAVENOUS | Status: DC | PRN
  Filled 2020-07-11: qty 5

## 2020-07-11 MED ORDER — SODIUM CHLORIDE 0.9% FLUSH
10.0000 mL | INTRAVENOUS | Status: DC | PRN
  Filled 2020-07-11: qty 10

## 2020-07-11 MED ORDER — SODIUM CHLORIDE 0.9 % IV SOLN
2000.0000 mg/m2 | INTRAVENOUS | Status: DC
  Administered 2020-07-11: 3400 mg via INTRAVENOUS
  Filled 2020-07-11: qty 68

## 2020-07-11 MED ORDER — SODIUM CHLORIDE 0.9% FLUSH
10.0000 mL | INTRAVENOUS | Status: DC | PRN
  Administered 2020-07-11: 10 mL via INTRAVENOUS
  Filled 2020-07-11: qty 10

## 2020-07-11 MED ORDER — SODIUM CHLORIDE 0.9 % IV SOLN
400.0000 mg/m2 | Freq: Once | INTRAVENOUS | Status: AC
  Administered 2020-07-11: 676 mg via INTRAVENOUS
  Filled 2020-07-11: qty 33.8

## 2020-07-11 MED ORDER — ATROPINE SULFATE 0.4 MG/ML IJ SOLN
INTRAMUSCULAR | Status: AC
  Filled 2020-07-11: qty 1

## 2020-07-11 MED ORDER — LORAZEPAM 2 MG/ML IJ SOLN
INTRAMUSCULAR | Status: AC
  Filled 2020-07-11: qty 1

## 2020-07-11 MED ORDER — SODIUM CHLORIDE 0.9 % IV SOLN
150.0000 mg/m2 | Freq: Once | INTRAVENOUS | Status: AC
  Administered 2020-07-11: 260 mg via INTRAVENOUS
  Filled 2020-07-11: qty 13

## 2020-07-11 MED ORDER — PALONOSETRON HCL INJECTION 0.25 MG/5ML
INTRAVENOUS | Status: AC
  Filled 2020-07-11: qty 5

## 2020-07-11 MED ORDER — SODIUM CHLORIDE 0.9 % IV SOLN
INTRAVENOUS | Status: DC
  Filled 2020-07-11: qty 250

## 2020-07-11 MED ORDER — PALONOSETRON HCL INJECTION 0.25 MG/5ML
0.2500 mg | Freq: Once | INTRAVENOUS | Status: AC
  Administered 2020-07-11: 0.25 mg via INTRAVENOUS

## 2020-07-11 NOTE — Progress Notes (Signed)
[name] OFF[facility] PROGRESS NOTE   Diagnosis: Pancreas cancer  INTERVAL HISTORY:   Vicki [name] complete another cycle of FOLFIRINOX on 06/26/2020.  No nausea/vomiting or mouth sores.  She has alternating diarrhea and constipation.  She has persistent cold sensitivity and numbness/tingling in the extremities.  She continues to have back pain.  She takes oxycodone approximately once daily. She decreased the Decadron prophylaxis following chemotherapy. Objective:  Vital signs in last 24 hours:  Blood pressure 115/80, pulse 68, temperature (!) 96.3 F (35.7 C), temperature source Tympanic, resp. rate 17, height 5\' 6"  (1.676 m), weight 129 lb 12.8 oz (58.9 kg), SpO2 99 %.    HEENT: No thrush or ulcers Resp: Distant breath sounds, no respiratory distress Cardio: Regular rate and rhythm GI: No hepatosplenomegaly, no mass, nontender Vascular: No leg edema Neuro: Moderate loss of vibratory sense at the fingertips bilaterally   Portacath/PICC-without erythema  Lab Results:  Lab Results  Component Value Date   WBC 12.7 (H) 07/11/2020   HGB 10.5 (L) 07/11/2020   HCT 32.9 (L) 07/11/2020   MCV 93.7 07/11/2020   PLT 239 07/11/2020   NEUTROABS 9.4 (H) 07/11/2020    CMP  Lab Results  Component Value Date   NA 142 07/11/2020   K 4.1 07/11/2020   CL 108 07/11/2020   CO2 23 07/11/2020   GLUCOSE 90 07/11/2020   BUN 8 07/11/2020   CREATININE 0.66 07/11/2020   CALCIUM 9.6 07/11/2020   PROT 6.4 (L) 07/11/2020   ALBUMIN 3.4 (L) 07/11/2020   AST 17 07/11/2020   ALT 12 07/11/2020   ALKPHOS 173 (H) 07/11/2020   BILITOT 0.2 (L) 07/11/2020   GFRNONAA >60 07/11/2020   GFRAA >60 07/11/2020     Medications: I have reviewed the patient's current medications.   Assessment/Plan: 1. 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 MRI abdomen 02/01/2020-2.2 x 3.3 cm mass in the posterior pancreas head/uncinate process, mild  intrahepatic/extrahepatic ductal dilatation, no evidence of vascular invasion, possible small lymph nodes in the porta hepatis-poorly visualized ? EUS 02/29/2020-20 x 23 mm mass in the pancreas head, upstream pancreatic duct dilatation, 1 abnormal peripancreatic node, uT3?uN1 ? 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 CTs 03/19/2020-2.9 x 2.1 x 3.1 cm pancreas head/uncinate mass, lesion associated with infrarenal abdominal aorta and superior mesenteric artery, prominent and borderline enlarged retroperitoneal nodes including a left periaortic node, indeterminate 4 mm lung nodule ? Cycle 1 mFOLFIRINOX 1/95/09, complicated by n/v and hospital admission 5/2/ - 5/3 ? [facility] opinion at [facility] with Dr. Earnestine Mealing and Dr. Mariah Milling 04/02/20  ? PET scan 04/03/2020-hypermetabolic poorly marginated pancreatic head mass; 2 hypermetabolic left periaortic lymph nodes, hypermetabolic left supraclavicular lymph node  ? Cycle 2 FOLFIRINOX 04/11/2020 ? Ultrasound-guided biopsy of a left supraclavicular mass on 04/18/2020-poorly differentiated adenocarcinoma, cytokeratin 7+, cytokeratin 20 and CDX2 positive in rare cells. ? Cycle 3 FOLFIRINOX 04/24/2020-oxaliplatin infusion lengthened to 3 hours, Udenyca was not given (ordered) ? Cycle 4 FOLFIRINOX 05/08/2020 ? Cycle 5 FOLFIRINOX 05/22/2020-atropine and Ativan premedication added secondary to cholinergic symptoms and nausea from irinotecan ? CTs 05/29/2020-decreased left supraclavicular lymph node, slight decrease in size of the pancreas head mass, decrease in retroperitoneal lymphadenopathy ? Cycle 6 FOLFIRINOX 06/05/2020 ? Cycle 7 FOLFIRINOX 06/26/2020 ? Cycle 8 FOLFIRINOX 07/11/2020 (oxaliplatin held secondary to neuropathy)  2. Abdomen/back pain secondary to #1 3. Anorexia/weight loss secondary to #1 4. History of coronary artery disease,STEMI with the fib arrest 2011, status post  RCA stent 5. Constipation, secondary to #1 and morphine  6.    Hospital admission for n/v after cycle 1 FOLFIRINOX 5/2 - 5/3 --prophylactic dexamethasone beginning day 2, Decadron dose increased beginning with cycle 4 FOLFIRINOX 7.  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  She has completed 7 cycles of FOLFIRINOX.  She has increased neuropathy symptoms.  Oxaliplatin will be discontinued from the chemotherapy regimen today.  She will complete cycle 8 chemotherapy today.  Vicki [name] will return for an office visit and chemotherapy in 2 weeks.  The plan is to schedule a restaging CT evaluation after cycle 10.  Betsy Coder, MD  07/11/2020  9:00 AM

## 2020-07-11 NOTE — Telephone Encounter (Signed)
Scheduled appointments per 8/12 los. Left message for patient with appointments dates and times.

## 2020-07-11 NOTE — Patient Instructions (Signed)
[facility] Discharge Instructions for Patients Receiving Chemotherapy  Today you received the following chemotherapy agents: Leucovorin, Irinotecan and Adrucil   To help prevent nausea and vomiting after your treatment, we encourage you to take your nausea medication as directed.    If you develop nausea and vomiting that is not controlled by your nausea medication, call the clinic.   BELOW ARE SYMPTOMS THAT SHOULD BE REPORTED IMMEDIATELY:  *FEVER GREATER THAN 100.5 F  *CHILLS WITH OR WITHOUT FEVER  NAUSEA AND VOMITING THAT IS NOT CONTROLLED WITH YOUR NAUSEA MEDICATION  *UNUSUAL SHORTNESS OF BREATH  *UNUSUAL BRUISING OR BLEEDING  TENDERNESS IN MOUTH AND THROAT WITH OR WITHOUT PRESENCE OF ULCERS  *URINARY PROBLEMS  *BOWEL PROBLEMS  UNUSUAL RASH Items with * indicate a potential emergency and should be followed up as soon as possible.  Feel free to call the clinic should you have any questions or concerns. The clinic phone number is [phone].  Please show the [place] at check-in to the Emergency Department and triage nurse.

## 2020-07-12 LAB — CANCER ANTIGEN 19-9: CA 19-9: 20 U/mL (ref 0–35)

## 2020-07-13 ENCOUNTER — Other Ambulatory Visit: Payer: Self-pay

## 2020-07-13 ENCOUNTER — Inpatient Hospital Stay: Payer: 59

## 2020-07-13 VITALS — BP 105/67 | HR 73 | Temp 97.7°F | Resp 17

## 2020-07-13 DIAGNOSIS — Z5111 Encounter for antineoplastic chemotherapy: Secondary | ICD-10-CM | POA: Diagnosis not present

## 2020-07-13 DIAGNOSIS — C250 Malignant neoplasm of head of pancreas: Secondary | ICD-10-CM

## 2020-07-13 MED ORDER — SODIUM CHLORIDE 0.9% FLUSH
10.0000 mL | INTRAVENOUS | Status: DC | PRN
  Administered 2020-07-13: 10 mL
  Filled 2020-07-13: qty 10

## 2020-07-13 MED ORDER — PEGFILGRASTIM-BMEZ 6 MG/0.6ML ~~LOC~~ SOSY
6.0000 mg | PREFILLED_SYRINGE | Freq: Once | SUBCUTANEOUS | Status: AC
  Administered 2020-07-13: 6 mg via SUBCUTANEOUS

## 2020-07-13 MED ORDER — HEPARIN SOD (PORK) LOCK FLUSH 100 UNIT/ML IV SOLN
500.0000 [IU] | Freq: Once | INTRAVENOUS | Status: AC | PRN
  Administered 2020-07-13: 500 [IU]
  Filled 2020-07-13: qty 5

## 2020-07-13 NOTE — Patient Instructions (Signed)

## 2020-07-21 ENCOUNTER — Other Ambulatory Visit: Payer: Self-pay | Admitting: Oncology

## 2020-07-22 NOTE — Progress Notes (Signed)
[employer] requiring switch from Aloxi (non-pref) to Zofran IV (pref).  Orders updated  Kennith Center, Pharm.D., CPP 07/22/2020@1 :08 PM

## 2020-07-24 ENCOUNTER — Inpatient Hospital Stay (HOSPITAL_BASED_OUTPATIENT_CLINIC_OR_DEPARTMENT_OTHER): Payer: 59 | Admitting: Oncology

## 2020-07-24 ENCOUNTER — Other Ambulatory Visit: Payer: Self-pay

## 2020-07-24 ENCOUNTER — Inpatient Hospital Stay: Payer: 59

## 2020-07-24 VITALS — BP 123/72 | HR 66 | Temp 97.5°F | Resp 17 | Ht 66.0 in | Wt 130.6 lb

## 2020-07-24 DIAGNOSIS — Z5111 Encounter for antineoplastic chemotherapy: Secondary | ICD-10-CM | POA: Diagnosis not present

## 2020-07-24 DIAGNOSIS — C250 Malignant neoplasm of head of pancreas: Secondary | ICD-10-CM | POA: Diagnosis not present

## 2020-07-24 DIAGNOSIS — Z95828 Presence of other vascular implants and grafts: Secondary | ICD-10-CM

## 2020-07-24 LAB — CMP (CANCER CENTER ONLY)
ALT: 10 U/L (ref 0–44)
AST: 16 U/L (ref 15–41)
Albumin: 3.4 g/dL — ABNORMAL LOW (ref 3.5–5.0)
Alkaline Phosphatase: 150 U/L — ABNORMAL HIGH (ref 38–126)
Anion gap: 9 (ref 5–15)
BUN: 7 mg/dL — ABNORMAL LOW (ref 8–23)
CO2: 24 mmol/L (ref 22–32)
Calcium: 9.4 mg/dL (ref 8.9–10.3)
Chloride: 108 mmol/L (ref 98–111)
Creatinine: 0.61 mg/dL (ref 0.44–1.00)
GFR, Est AFR Am: >60 mL/min (ref >60)
GFR, Estimated: >60 mL/min (ref >60)
Glucose, Bld: 93 mg/dL (ref 70–99)
Potassium: 4 mmol/L (ref 3.5–5.1)
Sodium: 141 mmol/L (ref 135–145)
Total Bilirubin: <0.2 mg/dL — ABNORMAL LOW (ref 0.3–1.2)
Total Protein: 6 g/dL — ABNORMAL LOW (ref 6.5–8.1)

## 2020-07-24 LAB — CBC WITH DIFFERENTIAL (CANCER CENTER ONLY)
Abs Immature Granulocytes: 0.25 10*3/uL — ABNORMAL HIGH (ref 0.00–0.07)
Basophils Absolute: 0.1 10*3/uL (ref 0.0–0.1)
Basophils Relative: 0 %
Eosinophils Absolute: 0.2 10*3/uL (ref 0.0–0.5)
Eosinophils Relative: 2 %
HCT: 31.1 % — ABNORMAL LOW (ref 36.0–46.0)
Hemoglobin: 10 g/dL — ABNORMAL LOW (ref 12.0–15.0)
Immature Granulocytes: 2 %
Lymphocytes Relative: 13 %
Lymphs Abs: 1.8 10*3/uL (ref 0.7–4.0)
MCH: 30.5 pg (ref 26.0–34.0)
MCHC: 32.2 g/dL (ref 30.0–36.0)
MCV: 94.8 fL (ref 80.0–100.0)
Monocytes Absolute: 0.9 10*3/uL (ref 0.1–1.0)
Monocytes Relative: 7 %
Neutro Abs: 10.3 10*3/uL — ABNORMAL HIGH (ref 1.7–7.7)
Neutrophils Relative %: 76 %
Platelet Count: 242 10*3/uL (ref 150–400)
RBC: 3.28 MIL/uL — ABNORMAL LOW (ref 3.87–5.11)
RDW: 17.1 % — ABNORMAL HIGH (ref 11.5–15.5)
WBC Count: 13.6 10*3/uL — ABNORMAL HIGH (ref 4.0–10.5)
nRBC: 0 % (ref 0.0–0.2)

## 2020-07-24 LAB — MAGNESIUM: Magnesium: 1.5 mg/dL — ABNORMAL LOW (ref 1.7–2.4)

## 2020-07-24 MED ORDER — SODIUM CHLORIDE 0.9 % IV SOLN
2000.0000 mg/m2 | INTRAVENOUS | Status: DC
  Administered 2020-07-24: 3400 mg via INTRAVENOUS
  Filled 2020-07-24: qty 68

## 2020-07-24 MED ORDER — LORAZEPAM 2 MG/ML IJ SOLN
INTRAMUSCULAR | Status: AC
  Filled 2020-07-24: qty 1

## 2020-07-24 MED ORDER — ATROPINE SULFATE 0.4 MG/ML IJ SOLN
INTRAMUSCULAR | Status: AC
  Filled 2020-07-24: qty 1

## 2020-07-24 MED ORDER — SODIUM CHLORIDE 0.9 % IV SOLN
10.0000 mg | Freq: Once | INTRAVENOUS | Status: AC
  Administered 2020-07-24: 10 mg via INTRAVENOUS
  Filled 2020-07-24: qty 10

## 2020-07-24 MED ORDER — SODIUM CHLORIDE 0.9% FLUSH
10.0000 mL | INTRAVENOUS | Status: DC | PRN
  Administered 2020-07-24: 10 mL via INTRAVENOUS
  Filled 2020-07-24: qty 10

## 2020-07-24 MED ORDER — SODIUM CHLORIDE 0.9 % IV SOLN
150.0000 mg | Freq: Once | INTRAVENOUS | Status: AC
  Administered 2020-07-24: 150 mg via INTRAVENOUS
  Filled 2020-07-24: qty 150

## 2020-07-24 MED ORDER — MAGNESIUM SULFATE 2 GM/50ML IV SOLN
INTRAVENOUS | Status: AC
  Filled 2020-07-24: qty 50

## 2020-07-24 MED ORDER — ATROPINE SULFATE 1 MG/ML IJ SOLN
0.4000 mg | Freq: Once | INTRAMUSCULAR | Status: AC
  Administered 2020-07-24: 0.4 mg via INTRAVENOUS

## 2020-07-24 MED ORDER — SODIUM CHLORIDE 0.9 % IV SOLN
16.0000 mg | Freq: Once | INTRAVENOUS | Status: AC
  Administered 2020-07-24: 16 mg via INTRAVENOUS
  Filled 2020-07-24: qty 8

## 2020-07-24 MED ORDER — LORAZEPAM 2 MG/ML IJ SOLN
0.5000 mg | Freq: Once | INTRAMUSCULAR | Status: AC
  Administered 2020-07-24: 0.5 mg via INTRAVENOUS

## 2020-07-24 MED ORDER — SODIUM CHLORIDE 0.9 % IV SOLN
400.0000 mg/m2 | Freq: Once | INTRAVENOUS | Status: AC
  Administered 2020-07-24: 676 mg via INTRAVENOUS
  Filled 2020-07-24: qty 33.8

## 2020-07-24 MED ORDER — MAGNESIUM SULFATE 2 GM/50ML IV SOLN
2.0000 g | Freq: Once | INTRAVENOUS | Status: AC
  Administered 2020-07-24: 2 g via INTRAVENOUS

## 2020-07-24 MED ORDER — SODIUM CHLORIDE 0.9 % IV SOLN
Freq: Once | INTRAVENOUS | Status: AC
  Filled 2020-07-24: qty 250

## 2020-07-24 MED ORDER — ATROPINE SULFATE 1 MG/ML IJ SOLN
INTRAMUSCULAR | Status: AC
  Filled 2020-07-24: qty 1

## 2020-07-24 MED ORDER — MORPHINE SULFATE ER 15 MG PO TBCR: 15.0000 mg | EXTENDED_RELEASE_TABLET | Freq: Two times a day (BID) | ORAL | 0 refills | Status: DC

## 2020-07-24 MED ORDER — DEXTROSE 5 % IV SOLN
Freq: Once | INTRAVENOUS | Status: AC
  Filled 2020-07-24: qty 250

## 2020-07-24 MED ORDER — SODIUM CHLORIDE 0.9 % IV SOLN
150.0000 mg/m2 | Freq: Once | INTRAVENOUS | Status: AC
  Administered 2020-07-24: 260 mg via INTRAVENOUS
  Filled 2020-07-24: qty 13

## 2020-07-24 MED ORDER — SODIUM CHLORIDE 0.9% FLUSH
10.0000 mL | INTRAVENOUS | Status: DC | PRN
  Filled 2020-07-24: qty 10

## 2020-07-24 NOTE — Patient Instructions (Signed)

## 2020-07-24 NOTE — Progress Notes (Signed)
[facility] OFFICE PROGRESS NOTE   Diagnosis: Pancreas cancer  INTERVAL HISTORY:   [name] completed another cycle of FOLFIRINOX on 07/11/2020.  Oxaliplatin was held secondary to neuropathy.  She reports progressive peripheral numbness and tingling.  No pain.  She continues to have abdomen/back pain and takes approximately 1 oxycodone per day for breakthrough pain.  She had increased malaise following this cycle of chemotherapy.  She had an episode of nausea and vomiting on 07/20/2020.  She has alternating constipation and diarrhea.  She is not taking laxatives or antidiarrhea medication.  She has pruritus.  Objective:  Vital signs in last 24 hours:  Blood pressure 123/72, pulse 66, temperature (!) 97.5 F (36.4 C), temperature source Axillary, resp. rate 17, height 5\' 6"  (1.676 m), weight 130 lb 9.6 oz (59.2 kg), SpO2 100 %.    HEENT: No thrush or ulcers, hyperpigmentation of the buccal mucosa Resp: Lungs with distant breath sounds, no respiratory distress Cardio: Regular rate and rhythm GI: No hepatosplenomegaly, nontender, no mass Vascular: No leg edema  Skin: Palms without erythema  Portacath/PICC-without erythema  Lab Results:  Lab Results  Component Value Date   WBC 13.6 (H) 07/24/2020   HGB 10.0 (L) 07/24/2020   HCT 31.1 (L) 07/24/2020   MCV 94.8 07/24/2020   PLT 242 07/24/2020   NEUTROABS 10.3 (H) 07/24/2020    CMP  Lab Results  Component Value Date   NA 142 07/11/2020   K 4.1 07/11/2020   CL 108 07/11/2020   CO2 23 07/11/2020   GLUCOSE 90 07/11/2020   BUN 8 07/11/2020   CREATININE 0.66 07/11/2020   CALCIUM 9.6 07/11/2020   PROT 6.4 (L) 07/11/2020   ALBUMIN 3.4 (L) 07/11/2020   AST 17 07/11/2020   ALT 12 07/11/2020   ALKPHOS 173 (H) 07/11/2020   BILITOT 0.2 (L) 07/11/2020   GFRNONAA >60 07/11/2020   GFRAA >60 07/11/2020   Medications: I have reviewed the patient's current medications.   Assessment/Plan: 1. Pancreas cancer, FNA  biopsy of a pancreas head mass on 02/29/2020-adenocarcinoma ? MRI abdomen 02/01/2020-2.2 x 3.3 cm mass in the posterior pancreas head/uncinate process, mild intrahepatic/extrahepatic ductal dilatation, no evidence of vascular invasion, possible small lymph nodes in the porta hepatis-poorly visualized ? EUS 02/29/2020-20 x 23 mm mass in the pancreas head, upstream pancreatic duct dilatation, 1 abnormal peripancreatic node, uT3?uN1 ? 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 CTs 03/19/2020-2.9 x 2.1 x 3.1 cm pancreas head/uncinate mass, lesion associated with infrarenal abdominal aorta and superior mesenteric artery, prominent and borderline enlarged retroperitoneal nodes including a left periaortic node, indeterminate 4 mm lung nodule ? Cycle 1 mFOLFIRINOX 2/62/03, complicated by n/v and hospital admission 5/2/ - 5/3 ? [facility] opinion at [facility] with Dr. Earnestine Mealing and Dr. Mariah Milling 04/02/20  ? PET scan 04/03/2020-hypermetabolic poorly marginated pancreatic head mass; 2 hypermetabolic left periaortic lymph nodes, hypermetabolic left supraclavicular lymph node  ? Cycle 2 FOLFIRINOX 04/11/2020 ? Ultrasound-guided biopsy of a left supraclavicular mass on 04/18/2020-poorly differentiated adenocarcinoma, cytokeratin 7+, cytokeratin 20 and CDX2 positive in rare cells. ? Cycle 3 FOLFIRINOX 04/24/2020-oxaliplatin infusion lengthened to 3 hours, Udenyca was not given (ordered) ? Cycle 4 FOLFIRINOX 05/08/2020 ? Cycle 5 FOLFIRINOX 05/22/2020-atropine and Ativan premedication added secondary to cholinergic symptoms and nausea from irinotecan ? CTs 05/29/2020-decreased left supraclavicular lymph node, slight decrease in size of the pancreas head mass, decrease in retroperitoneal lymphadenopathy ? Cycle 6 FOLFIRINOX 06/05/2020 ? Cycle 7 FOLFIRINOX 06/26/2020 ? Cycle 8 FOLFIRINOX 07/11/2020 (  oxaliplatin held secondary to neuropathy) ? Cycle 9 FOLFIRINOX 07/24/2020 (oxaliplatin held secondary to neuropathy)  2.  Abdomen/back pain secondary to #1 3. Anorexia/weight loss secondary to #1 4. History of coronary artery disease,STEMI with the fib arrest 2011, status post RCA stent 5. Constipation, secondary to #1 and morphine  6.   Hospital admission for n/v after cycle 1 FOLFIRINOX 5/2 - 5/3 --prophylactic dexamethasone beginning day 2, Decadron dose increased beginning with cycle 4 FOLFIRINOX 7.  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  Her clinical status has improved.  The CA 19-9 is normal.  The plan is to continue chemotherapy for 10 cycles prior to a restaging CT evaluation.  She will complete cycle 9 FOLFIRINOX today.  Oxaliplatin will remain on hold secondary to progressive neuropathy symptoms.  She declined medical therapy for the peripheral tingling.  [name] will return for an office visit and chemotherapy in 2 weeks.  She will receive a COVID-19 booster vaccine when she returns in 2 weeks.  I refilled her prescription for MS Contin.  Betsy Coder, MD  07/24/2020  8:53 AM

## 2020-07-24 NOTE — Patient Instructions (Signed)
[facility] Discharge Instructions for Patients Receiving Chemotherapy  Today you received the following chemotherapy agents: Leucovorin, Irinotecan and Adrucil   To help prevent nausea and vomiting after your treatment, we encourage you to take your nausea medication as directed.    If you develop nausea and vomiting that is not controlled by your nausea medication, call the clinic.   BELOW ARE SYMPTOMS THAT SHOULD BE REPORTED IMMEDIATELY:  *FEVER GREATER THAN 100.5 F  *CHILLS WITH OR WITHOUT FEVER  NAUSEA AND VOMITING THAT IS NOT CONTROLLED WITH YOUR NAUSEA MEDICATION  *UNUSUAL SHORTNESS OF BREATH  *UNUSUAL BRUISING OR BLEEDING  TENDERNESS IN MOUTH AND THROAT WITH OR WITHOUT PRESENCE OF ULCERS  *URINARY PROBLEMS  *BOWEL PROBLEMS  UNUSUAL RASH Items with * indicate a potential emergency and should be followed up as soon as possible.  Feel free to call the clinic should you have any questions or concerns. The clinic phone number is [phone].  Please show the [place] at check-in to the Emergency Department and triage nurse.

## 2020-07-24 NOTE — Progress Notes (Signed)
Mg = 1.5 Mg Sulf 2 g IV during infusion over 1 hour.  This is compatible w/ Irinotecan & Leucovorin.  Kennith Center, Pharm.D., CPP 07/24/2020@9 :59 AM

## 2020-07-25 ENCOUNTER — Telehealth: Payer: Self-pay | Admitting: Oncology

## 2020-07-25 ENCOUNTER — Encounter: Payer: Self-pay | Admitting: Nutrition

## 2020-07-25 LAB — CANCER ANTIGEN 19-9: CA 19-9: 20 U/mL (ref 0–35)

## 2020-07-25 NOTE — Telephone Encounter (Signed)
Scheduled appointments per 8/25 los. Patient is aware of appointment date and times.

## 2020-07-25 NOTE — Progress Notes (Signed)
Late entry  Brief follow up with patient receiving treatment for pancreas cancer.  Weight decreased and was documented as 130.6 pounds on August 25.  Patient continues to try to eat the best she can.  She tries to eat smaller more frequent meals and snacks.  She had a little nausea and vomiting.  She has alternating constipation and diarrhea.  She reports she is receiving cycle 9 FOLFIRINOX and has 1 more treatment scheduled.  She will have a CT evaluation.  I provided support and encouragement.  I will continue to follow patient as needed.

## 2020-07-26 ENCOUNTER — Inpatient Hospital Stay: Payer: 59

## 2020-07-26 ENCOUNTER — Other Ambulatory Visit: Payer: Self-pay

## 2020-07-26 DIAGNOSIS — Z5111 Encounter for antineoplastic chemotherapy: Secondary | ICD-10-CM | POA: Diagnosis not present

## 2020-07-26 DIAGNOSIS — C250 Malignant neoplasm of head of pancreas: Secondary | ICD-10-CM

## 2020-07-26 MED ORDER — PEGFILGRASTIM-BMEZ 6 MG/0.6ML ~~LOC~~ SOSY
PREFILLED_SYRINGE | SUBCUTANEOUS | Status: AC
  Filled 2020-07-26: qty 0.6

## 2020-07-26 MED ORDER — PEGFILGRASTIM-BMEZ 6 MG/0.6ML ~~LOC~~ SOSY
6.0000 mg | PREFILLED_SYRINGE | Freq: Once | SUBCUTANEOUS | Status: AC
  Administered 2020-07-26: 6 mg via SUBCUTANEOUS

## 2020-07-26 MED ORDER — SODIUM CHLORIDE 0.9% FLUSH
10.0000 mL | INTRAVENOUS | Status: DC | PRN
  Administered 2020-07-26: 10 mL
  Filled 2020-07-26: qty 10

## 2020-07-26 MED ORDER — HEPARIN SOD (PORK) LOCK FLUSH 100 UNIT/ML IV SOLN
500.0000 [IU] | Freq: Once | INTRAVENOUS | Status: AC | PRN
  Administered 2020-07-26: 500 [IU]
  Filled 2020-07-26: qty 5

## 2020-07-26 NOTE — Patient Instructions (Signed)

## 2020-08-05 ENCOUNTER — Other Ambulatory Visit: Payer: Self-pay | Admitting: Oncology

## 2020-08-07 ENCOUNTER — Inpatient Hospital Stay: Payer: 59 | Attending: Oncology

## 2020-08-07 ENCOUNTER — Inpatient Hospital Stay: Payer: 59

## 2020-08-07 ENCOUNTER — Inpatient Hospital Stay (HOSPITAL_BASED_OUTPATIENT_CLINIC_OR_DEPARTMENT_OTHER): Payer: 59 | Admitting: Oncology

## 2020-08-07 ENCOUNTER — Other Ambulatory Visit: Payer: Self-pay

## 2020-08-07 ENCOUNTER — Other Ambulatory Visit: Payer: Self-pay | Admitting: *Deleted

## 2020-08-07 VITALS — BP 129/55 | HR 63 | Temp 97.6°F | Resp 18 | Ht 66.0 in | Wt 131.6 lb

## 2020-08-07 DIAGNOSIS — Z23 Encounter for immunization: Secondary | ICD-10-CM | POA: Insufficient documentation

## 2020-08-07 DIAGNOSIS — Z5189 Encounter for other specified aftercare: Secondary | ICD-10-CM | POA: Diagnosis not present

## 2020-08-07 DIAGNOSIS — C250 Malignant neoplasm of head of pancreas: Secondary | ICD-10-CM

## 2020-08-07 DIAGNOSIS — K769 Liver disease, unspecified: Secondary | ICD-10-CM | POA: Insufficient documentation

## 2020-08-07 DIAGNOSIS — Z5111 Encounter for antineoplastic chemotherapy: Secondary | ICD-10-CM | POA: Insufficient documentation

## 2020-08-07 LAB — CMP (CANCER CENTER ONLY)
ALT: 77 U/L — ABNORMAL HIGH (ref 0–44)
AST: 92 U/L — ABNORMAL HIGH (ref 15–41)
Albumin: 3.5 g/dL (ref 3.5–5.0)
Alkaline Phosphatase: 282 U/L — ABNORMAL HIGH (ref 38–126)
Anion gap: 8 (ref 5–15)
BUN: 10 mg/dL (ref 8–23)
CO2: 25 mmol/L (ref 22–32)
Calcium: 8.9 mg/dL (ref 8.9–10.3)
Chloride: 110 mmol/L (ref 98–111)
Creatinine: 0.64 mg/dL (ref 0.44–1.00)
GFR, Est AFR Am: >60 mL/min (ref >60)
GFR, Estimated: >60 mL/min (ref >60)
Glucose, Bld: 109 mg/dL — ABNORMAL HIGH (ref 70–99)
Potassium: 3.8 mmol/L (ref 3.5–5.1)
Sodium: 143 mmol/L (ref 135–145)
Total Bilirubin: 0.2 mg/dL — ABNORMAL LOW (ref 0.3–1.2)
Total Protein: 6.2 g/dL — ABNORMAL LOW (ref 6.5–8.1)

## 2020-08-07 LAB — CBC WITH DIFFERENTIAL (CANCER CENTER ONLY)
Abs Immature Granulocytes: 0.19 10*3/uL — ABNORMAL HIGH (ref 0.00–0.07)
Basophils Absolute: 0.1 10*3/uL (ref 0.0–0.1)
Basophils Relative: 0 %
Eosinophils Absolute: 0.2 10*3/uL (ref 0.0–0.5)
Eosinophils Relative: 1 %
HCT: 31.9 % — ABNORMAL LOW (ref 36.0–46.0)
Hemoglobin: 10.2 g/dL — ABNORMAL LOW (ref 12.0–15.0)
Immature Granulocytes: 1 %
Lymphocytes Relative: 13 %
Lymphs Abs: 1.9 10*3/uL (ref 0.7–4.0)
MCH: 30.7 pg (ref 26.0–34.0)
MCHC: 32 g/dL (ref 30.0–36.0)
MCV: 96.1 fL (ref 80.0–100.0)
Monocytes Absolute: 0.8 10*3/uL (ref 0.1–1.0)
Monocytes Relative: 6 %
Neutro Abs: 11.3 10*3/uL — ABNORMAL HIGH (ref 1.7–7.7)
Neutrophils Relative %: 79 %
Platelet Count: 188 10*3/uL (ref 150–400)
RBC: 3.32 MIL/uL — ABNORMAL LOW (ref 3.87–5.11)
RDW: 17.1 % — ABNORMAL HIGH (ref 11.5–15.5)
WBC Count: 14.5 10*3/uL — ABNORMAL HIGH (ref 4.0–10.5)
nRBC: 0 % (ref 0.0–0.2)

## 2020-08-07 MED ORDER — SODIUM CHLORIDE 0.9% FLUSH
10.0000 mL | INTRAVENOUS | Status: DC | PRN
  Filled 2020-08-07: qty 10

## 2020-08-07 MED ORDER — SODIUM CHLORIDE 0.9 % IV SOLN
150.0000 mg/m2 | Freq: Once | INTRAVENOUS | Status: AC
  Administered 2020-08-07: 260 mg via INTRAVENOUS
  Filled 2020-08-07: qty 13

## 2020-08-07 MED ORDER — ATROPINE SULFATE 0.4 MG/ML IJ SOLN
INTRAMUSCULAR | Status: AC
  Filled 2020-08-07: qty 1

## 2020-08-07 MED ORDER — SODIUM CHLORIDE 0.9 % IV SOLN
400.0000 mg/m2 | Freq: Once | INTRAVENOUS | Status: AC
  Administered 2020-08-07: 676 mg via INTRAVENOUS
  Filled 2020-08-07: qty 33.8

## 2020-08-07 MED ORDER — SODIUM CHLORIDE 0.9 % IV SOLN
2000.0000 mg/m2 | INTRAVENOUS | Status: DC
  Administered 2020-08-07: 3400 mg via INTRAVENOUS
  Filled 2020-08-07: qty 68

## 2020-08-07 MED ORDER — LORAZEPAM 2 MG/ML IJ SOLN
INTRAMUSCULAR | Status: AC
  Filled 2020-08-07: qty 1

## 2020-08-07 MED ORDER — ATROPINE SULFATE 1 MG/ML IJ SOLN
0.4000 mg | Freq: Once | INTRAMUSCULAR | Status: AC
  Administered 2020-08-07: 0.4 mg via INTRAVENOUS

## 2020-08-07 MED ORDER — ATROPINE SULFATE 1 MG/ML IJ SOLN
INTRAMUSCULAR | Status: AC
  Filled 2020-08-07: qty 1

## 2020-08-07 MED ORDER — PREDNISONE 50 MG PO TABS: ORAL_TABLET | ORAL | 1 refills | Status: DC

## 2020-08-07 MED ORDER — SODIUM CHLORIDE 0.9 % IV SOLN
16.0000 mg | Freq: Once | INTRAVENOUS | Status: AC
  Administered 2020-08-07: 16 mg via INTRAVENOUS
  Filled 2020-08-07: qty 8

## 2020-08-07 MED ORDER — SODIUM CHLORIDE 0.9 % IV SOLN
10.0000 mg | Freq: Once | INTRAVENOUS | Status: AC
  Administered 2020-08-07: 10 mg via INTRAVENOUS
  Filled 2020-08-07: qty 10

## 2020-08-07 MED ORDER — DEXTROSE 5 % IV SOLN
Freq: Once | INTRAVENOUS | Status: AC
  Filled 2020-08-07: qty 250

## 2020-08-07 MED ORDER — LORAZEPAM 2 MG/ML IJ SOLN
0.5000 mg | Freq: Once | INTRAMUSCULAR | Status: AC
  Administered 2020-08-07: 0.5 mg via INTRAVENOUS

## 2020-08-07 MED ORDER — SODIUM CHLORIDE 0.9 % IV SOLN
150.0000 mg | Freq: Once | INTRAVENOUS | Status: AC
  Administered 2020-08-07: 150 mg via INTRAVENOUS
  Filled 2020-08-07: qty 150

## 2020-08-07 NOTE — Progress Notes (Signed)
Ok to treat per V.O. Dr Benay Spice w/ AST 92 ans ALT 77

## 2020-08-07 NOTE — Progress Notes (Signed)
[facility] OFFICE PROGRESS NOTE   Diagnosis: Pancreas cancer  INTERVAL HISTORY:   Ms. Paulding complete another cycle of FOLFIRI on 07/24/2020.  No diarrhea.  She continues to have abdomen and back pain.  She had several episodes of vomiting during the week following chemotherapy.  No preceding nausea.  She has persistent peripheral numbness.  Objective:  Vital signs in last 24 hours:  Blood pressure (!) 129/55, pulse 63, temperature 97.6 F (36.4 C), temperature source Oral, resp. rate 18, height 5\' 6"  (1.676 m), weight 131 lb 9.6 oz (59.7 kg), SpO2 100 %.    HEENT: No thrush Resp: Lungs clear bilaterally Cardio: Regular rate and rhythm GI: No hepatosplenomegaly, nontender, no mass Vascular: No leg edema  Portacath/PICC-without erythema  Lab Results:  Lab Results  Component Value Date   WBC 14.5 (H) 08/07/2020   HGB 10.2 (L) 08/07/2020   HCT 31.9 (L) 08/07/2020   MCV 96.1 08/07/2020   PLT 188 08/07/2020   NEUTROABS 11.3 (H) 08/07/2020    CMP  Lab Results  Component Value Date   NA 141 07/24/2020   K 4.0 07/24/2020   CL 108 07/24/2020   CO2 24 07/24/2020   GLUCOSE 93 07/24/2020   BUN 7 (L) 07/24/2020   CREATININE 0.61 07/24/2020   CALCIUM 9.4 07/24/2020   PROT 6.0 (L) 07/24/2020   ALBUMIN 3.4 (L) 07/24/2020   AST 16 07/24/2020   ALT 10 07/24/2020   ALKPHOS 150 (H) 07/24/2020   BILITOT <0.2 (L) 07/24/2020   GFRNONAA >60 07/24/2020   GFRAA >60 07/24/2020    No results found for: CEA1  Lab Results  Component Value Date   INR 1.1 04/18/2020    Imaging:  No results found.  Medications: I have reviewed the patient's current medications.   Assessment/Plan: 1. 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 MRI abdomen 02/01/2020-2.2 x 3.3 cm mass in the posterior pancreas head/uncinate process, mild intrahepatic/extrahepatic ductal dilatation, no evidence of vascular invasion, possible small lymph nodes in  the porta hepatis-poorly visualized ? EUS 02/29/2020-20 x 23 mm mass in the pancreas head, upstream pancreatic duct dilatation, 1 abnormal peripancreatic node, uT3?uN1 ? 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 CTs 03/19/2020-2.9 x 2.1 x 3.1 cm pancreas head/uncinate mass, lesion associated with infrarenal abdominal aorta and superior mesenteric artery, prominent and borderline enlarged retroperitoneal nodes including a left periaortic node, indeterminate 4 mm lung nodule ? Cycle 1 mFOLFIRINOX 1/61/09, complicated by n/v and hospital admission 5/2/ - 5/3 ? Second opinion at [facility] with Dr. Earnestine Mealing and Dr. Mariah Milling 04/02/20  ? PET scan 04/03/2020-hypermetabolic poorly marginated pancreatic head mass; 2 hypermetabolic left periaortic lymph nodes, hypermetabolic left supraclavicular lymph node  ? Cycle 2 FOLFIRINOX 04/11/2020 ? Ultrasound-guided biopsy of a left supraclavicular mass on 04/18/2020-poorly differentiated adenocarcinoma, cytokeratin 7+, cytokeratin 20 and CDX2 positive in rare cells. ? Cycle 3 FOLFIRINOX 04/24/2020-oxaliplatin infusion lengthened to 3 hours, Udenyca was not given (ordered) ? Cycle 4 FOLFIRINOX 05/08/2020 ? Cycle 5 FOLFIRINOX 05/22/2020-atropine and Ativan premedication added secondary to cholinergic symptoms and nausea from irinotecan ? CTs 05/29/2020-decreased left supraclavicular lymph node, slight decrease in size of the pancreas head mass, decrease in retroperitoneal lymphadenopathy ? Cycle 6 FOLFIRINOX 06/05/2020 ? Cycle 7 FOLFIRINOX 06/26/2020 ? Cycle 8 FOLFIRINOX 07/11/2020 (oxaliplatin held secondary to neuropathy) ? Cycle 9 FOLFIRINOX 07/24/2020 (oxaliplatin held secondary to neuropathy) ? Cycle 10 FOLFIRINOX 08/07/2020 (oxaliplatin held secondary to neuropathy)  2. Abdomen/back pain secondary to #  1 3. Anorexia/weight loss secondary to #1 4. History of coronary artery disease,STEMI with the fib arrest 2011, status post RCA stent 5.  Constipation, secondary to #1 and morphine  6.   Hospital admission for n/v after cycle 1 FOLFIRINOX 5/2 - 5/3 --prophylactic dexamethasone beginning day 2, Decadron dose increased beginning with cycle 4 FOLFIRINOX 7.  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  She will complete another cycle of chemotherapy today.  Oxaliplatin will remain on hold.  She will be scheduled for restaging CTs after this cycle. [name] will return for an office visit in COVID-19 booster vaccine in 2 weeks. Betsy Coder, MD  08/07/2020  8:59 AM

## 2020-08-07 NOTE — Patient Instructions (Signed)
[facility] Discharge Instructions for Patients Receiving Chemotherapy  Today you received the following chemotherapy agents: Leucovorin, Irinotecan and Adrucil   To help prevent nausea and vomiting after your treatment, we encourage you to take your nausea medication as directed.    If you develop nausea and vomiting that is not controlled by your nausea medication, call the clinic.   BELOW ARE SYMPTOMS THAT SHOULD BE REPORTED IMMEDIATELY:  *FEVER GREATER THAN 100.5 F  *CHILLS WITH OR WITHOUT FEVER  NAUSEA AND VOMITING THAT IS NOT CONTROLLED WITH YOUR NAUSEA MEDICATION  *UNUSUAL SHORTNESS OF BREATH  *UNUSUAL BRUISING OR BLEEDING  TENDERNESS IN MOUTH AND THROAT WITH OR WITHOUT PRESENCE OF ULCERS  *URINARY PROBLEMS  *BOWEL PROBLEMS  UNUSUAL RASH Items with * indicate a potential emergency and should be followed up as soon as possible.  Feel free to call the clinic should you have any questions or concerns. The clinic phone number is [phone].  Please show the [facility] at check-in to the Emergency Department and triage nurse.

## 2020-08-07 NOTE — Progress Notes (Signed)
Script for CT scan premeds sent to pharmacy due to rash/hives from IV contrast at last scan. Patient drinks the water based contrast in radiology.

## 2020-08-08 ENCOUNTER — Telehealth: Payer: Self-pay | Admitting: Oncology

## 2020-08-08 LAB — CANCER ANTIGEN 19-9: CA 19-9: 21 U/mL (ref 0–35)

## 2020-08-08 NOTE — Telephone Encounter (Signed)
Scheduled appointments per 9/8 los. Patient is aware of all appointments.

## 2020-08-09 ENCOUNTER — Inpatient Hospital Stay: Payer: 59

## 2020-08-09 ENCOUNTER — Other Ambulatory Visit: Payer: Self-pay

## 2020-08-09 VITALS — BP 127/72 | HR 53 | Temp 98.5°F | Resp 17

## 2020-08-09 DIAGNOSIS — C250 Malignant neoplasm of head of pancreas: Secondary | ICD-10-CM

## 2020-08-09 DIAGNOSIS — Z5111 Encounter for antineoplastic chemotherapy: Secondary | ICD-10-CM | POA: Diagnosis not present

## 2020-08-09 MED ORDER — HEPARIN SOD (PORK) LOCK FLUSH 100 UNIT/ML IV SOLN
500.0000 [IU] | Freq: Once | INTRAVENOUS | Status: AC | PRN
  Administered 2020-08-09: 500 [IU]
  Filled 2020-08-09: qty 5

## 2020-08-09 MED ORDER — PEGFILGRASTIM-BMEZ 6 MG/0.6ML ~~LOC~~ SOSY
6.0000 mg | PREFILLED_SYRINGE | Freq: Once | SUBCUTANEOUS | Status: AC
  Administered 2020-08-09: 6 mg via SUBCUTANEOUS

## 2020-08-09 MED ORDER — SODIUM CHLORIDE 0.9% FLUSH
10.0000 mL | INTRAVENOUS | Status: DC | PRN
  Administered 2020-08-09: 10 mL
  Filled 2020-08-09: qty 10

## 2020-08-09 NOTE — Patient Instructions (Signed)
Implanted Port Home Guide An implanted port is a device that is placed under the skin. It is usually placed in the chest. The device can be used to give IV medicine, to take blood, or for dialysis. You may have an implanted port if:  You need IV medicine that would be irritating to the small veins in your hands or arms.  You need IV medicines, such as antibiotics, for a long period of time.  You need IV nutrition for a long period of time.  You need dialysis. Having a port means that your health care provider will not need to use the veins in your arms for these procedures. You may have fewer limitations when using a port than you would if you used other types of long-term IVs, and you will likely be able to return to normal activities after your incision heals. An implanted port has two main parts:  Reservoir. The reservoir is the part where a needle is inserted to give medicines or draw blood. The reservoir is round. After it is placed, it appears as a small, raised area under your skin.  Catheter. The catheter is a thin, flexible tube that connects the reservoir to a vein. Medicine that is inserted into the reservoir goes into the catheter and then into the vein. How is my port accessed? To access your port:  A numbing cream may be placed on the skin over the port site.  Your health care provider will put on a mask and sterile gloves.  The skin over your port will be cleaned carefully with a germ-killing soap and allowed to dry.  Your health care provider will gently pinch the port and insert a needle into it.  Your health care provider will check for a blood return to make sure the port is in the vein and is not clogged.  If your port needs to remain accessed to get medicine continuously (constant infusion), your health care provider will place a clear bandage (dressing) over the needle site. The dressing and needle will need to be changed every week, or as told by your health care  provider. What is flushing? Flushing helps keep the port from getting clogged. Follow instructions from your health care provider about how and when to flush the port. Ports are usually flushed with saline solution or a medicine called heparin. The need for flushing will depend on how the port is used:  If the port is only used from time to time to give medicines or draw blood, the port may need to be flushed: ? Before and after medicines have been given. ? Before and after blood has been drawn. ? As part of routine maintenance. Flushing may be recommended every 4-6 weeks.  If a constant infusion is running, the port may not need to be flushed.  Throw away any syringes in a disposal container that is meant for sharp items (sharps container). You can buy a sharps container from a pharmacy, or you can make one by using an empty hard plastic bottle with a cover. How long will my port stay implanted? The port can stay in for as long as your health care provider thinks it is needed. When it is time for the port to come out, a surgery will be done to remove it. The surgery will be similar to the procedure that was done to put the port in. Follow these instructions at home:   Flush your port as told by your health care provider.    If you need an infusion over several days, follow instructions from your health care provider about how to take care of your port site. Make sure you: ? Wash your hands with soap and water before you change your dressing. If soap and water are not available, use alcohol-based hand sanitizer. ? Change your dressing as told by your health care provider. ? Place any used dressings or infusion bags into a plastic bag. Throw that bag in the trash. ? Keep the dressing that covers the needle clean and dry. Do not get it wet. ? Do not use scissors or sharp objects near the tube. ? Keep the tube clamped, unless it is being used.  Check your port site every day for signs of  infection. Check for: ? Redness, swelling, or pain. ? Fluid or blood. ? Pus or a bad smell.  Protect the skin around the port site. ? Avoid wearing bra straps that rub or irritate the site. ? Protect the skin around your port from seat belts. Place a soft pad over your chest if needed.  Bathe or shower as told by your health care provider. The site may get wet as long as you are not actively receiving an infusion.  Return to your normal activities as told by your health care provider. Ask your health care provider what activities are safe for you.  Carry a medical alert card or wear a medical alert bracelet at all times. This will let health care providers know that you have an implanted port in case of an emergency. Get help right away if:  You have redness, swelling, or pain at the port site.  You have fluid or blood coming from your port site.  You have pus or a bad smell coming from the port site.  You have a fever. Summary  Implanted ports are usually placed in the chest for long-term IV access.  Follow instructions from your health care provider about flushing the port and changing bandages (dressings).  Take care of the area around your port by avoiding clothing that puts pressure on the area, and by watching for signs of infection.  Protect the skin around your port from seat belts. Place a soft pad over your chest if needed.  Get help right away if you have a fever or you have redness, swelling, pain, drainage, or a bad smell at the port site. This information is not intended to replace advice given to you by your health care provider. Make sure you discuss any questions you have with your health care provider. Document Revised: 03/10/2019 Document Reviewed: 12/19/2016 Elsevier Patient Education  2020 Else[employer] Pegfilgrastim injection What is this medicine? PEGFILGRASTIM (PEG fil gra stim) is a long-acting granulocyte colony-stimulating factor that stimulates the  growth of neutrophils, a type of white blood cell important in the body's fight against infection. It is used to reduce the incidence of fever and infection in patients with certain types of cancer who are receiving chemotherapy that affects the bone marrow, and to increase survival after being exposed to high doses of radiation. This medicine may be used for other purposes; ask your health care provider or pharmacist if you have questions. COMMON BRAND NAME(S): Fulphila, Neulasta, UDENYCA, Ziextenzo What should I tell my health care provider before I take this medicine? They need to know if you have any of these conditions:  kidney disease  latex allergy  ongoing radiation therapy  sickle cell disease  skin reactions to acrylic adhesives (On-Body   Injector only)  an unusual or allergic reaction to pegfilgrastim, filgrastim, other medicines, foods, dyes, or preservatives  pregnant or trying to get pregnant  breast-feeding How should I use this medicine? This medicine is for injection under the skin. If you get this medicine at home, you will be taught how to prepare and give the pre-filled syringe or how to use the On-body Injector. Refer to the patient Instructions for Use for detailed instructions. Use exactly as directed. Tell your healthcare provider immediately if you suspect that the On-body Injector may not have performed as intended or if you suspect the use of the On-body Injector resulted in a missed or partial dose. It is important that you put your used needles and syringes in a special sharps container. Do not put them in a trash can. If you do not have a sharps container, call your pharmacist or healthcare provider to get one. Talk to your pediatrician regarding the use of this medicine in children. While this drug may be prescribed for selected conditions, precautions do apply. Overdosage: If you think you have taken too much of this medicine contact a poison control center or  emergency room at once. NOTE: This medicine is only for you. Do not share this medicine with others. What if I miss a dose? It is important not to miss your dose. Call your doctor or health care professional if you miss your dose. If you miss a dose due to an On-body Injector failure or leakage, a new dose should be administered as soon as possible using a single prefilled syringe for manual use. What may interact with this medicine? Interactions have not been studied. Give your health care provider a list of all the medicines, herbs, non-prescription drugs, or dietary supplements you use. Also tell them if you smoke, drink alcohol, or use illegal drugs. Some items may interact with your medicine. This list may not describe all possible interactions. Give your health care provider a list of all the medicines, herbs, non-prescription drugs, or dietary supplements you use. Also tell them if you smoke, drink alcohol, or use illegal drugs. Some items may interact with your medicine. What should I watch for while using this medicine? You may need blood work done while you are taking this medicine. If you are going to need a MRI, CT scan, or other procedure, tell your doctor that you are using this medicine (On-Body Injector only). What side effects may I notice from receiving this medicine? Side effects that you should report to your doctor or health care professional as soon as possible:  allergic reactions like skin rash, itching or hives, swelling of the face, lips, or tongue  back pain  dizziness  fever  pain, redness, or irritation at site where injected  pinpoint red spots on the skin  red or dark-brown urine  shortness of breath or breathing problems  stomach or side pain, or pain at the shoulder  swelling  tiredness  trouble passing urine or change in the amount of urine Side effects that usually do not require medical attention (report to your doctor or health care  professional if they continue or are bothersome):  bone pain  muscle pain This list may not describe all possible side effects. Call your doctor for medical advice about side effects. You may report side effects to FDA at 1-800-FDA-1088. Where should I keep my medicine? Keep out of the reach of children. If you are using this medicine at home, you will be instructed   on how to store it. Throw away any unused medicine after the expiration date on the label. NOTE: This sheet is a summary. It may not cover all possible information. If you have questions about this medicine, talk to your doctor, pharmacist, or health care provider.  2020 Elsevier/Gold Standard (2018-02-21 16:57:08)  

## 2020-08-18 ENCOUNTER — Other Ambulatory Visit: Payer: Self-pay | Admitting: Oncology

## 2020-08-19 ENCOUNTER — Ambulatory Visit (HOSPITAL_COMMUNITY)
Admission: RE | Admit: 2020-08-19 | Discharge: 2020-08-19 | Disposition: A | Discharge status: Home / Self Care | Payer: 59 | Source: Ambulatory Visit | Attending: Oncology | Admitting: Oncology

## 2020-08-19 ENCOUNTER — Other Ambulatory Visit: Payer: Self-pay

## 2020-08-19 ENCOUNTER — Other Ambulatory Visit: Payer: 59

## 2020-08-19 ENCOUNTER — Inpatient Hospital Stay: Payer: 59

## 2020-08-19 DIAGNOSIS — C250 Malignant neoplasm of head of pancreas: Secondary | ICD-10-CM | POA: Diagnosis not present

## 2020-08-19 DIAGNOSIS — Z95828 Presence of other vascular implants and grafts: Secondary | ICD-10-CM

## 2020-08-19 DIAGNOSIS — Z5111 Encounter for antineoplastic chemotherapy: Secondary | ICD-10-CM | POA: Diagnosis not present

## 2020-08-19 LAB — CMP (CANCER CENTER ONLY)
ALT: 73 U/L — ABNORMAL HIGH (ref 0–44)
AST: 33 U/L (ref 15–41)
Albumin: 3.7 g/dL (ref 3.5–5.0)
Alkaline Phosphatase: 266 U/L — ABNORMAL HIGH (ref 38–126)
Anion gap: 10 (ref 5–15)
BUN: 8 mg/dL (ref 8–23)
CO2: 22 mmol/L (ref 22–32)
Calcium: 9.2 mg/dL (ref 8.9–10.3)
Chloride: 104 mmol/L (ref 98–111)
Creatinine: 0.68 mg/dL (ref 0.44–1.00)
GFR, Est AFR Am: >60 mL/min (ref >60)
GFR, Estimated: >60 mL/min (ref >60)
Glucose, Bld: 193 mg/dL — ABNORMAL HIGH (ref 70–99)
Potassium: 4 mmol/L (ref 3.5–5.1)
Sodium: 136 mmol/L (ref 135–145)
Total Bilirubin: 0.3 mg/dL (ref 0.3–1.2)
Total Protein: 6.7 g/dL (ref 6.5–8.1)

## 2020-08-19 LAB — CBC WITH DIFFERENTIAL (CANCER CENTER ONLY)
Abs Immature Granulocytes: 0.43 10*3/uL — ABNORMAL HIGH (ref 0.00–0.07)
Basophils Absolute: 0 10*3/uL (ref 0.0–0.1)
Basophils Relative: 0 %
Eosinophils Absolute: 0 10*3/uL (ref 0.0–0.5)
Eosinophils Relative: 0 %
HCT: 31.6 % — ABNORMAL LOW (ref 36.0–46.0)
Hemoglobin: 10.2 g/dL — ABNORMAL LOW (ref 12.0–15.0)
Immature Granulocytes: 3 %
Lymphocytes Relative: 3 %
Lymphs Abs: 0.5 10*3/uL — ABNORMAL LOW (ref 0.7–4.0)
MCH: 31.2 pg (ref 26.0–34.0)
MCHC: 32.3 g/dL (ref 30.0–36.0)
MCV: 96.6 fL (ref 80.0–100.0)
Monocytes Absolute: 0.1 10*3/uL (ref 0.1–1.0)
Monocytes Relative: 1 %
Neutro Abs: 16 10*3/uL — ABNORMAL HIGH (ref 1.7–7.7)
Neutrophils Relative %: 93 %
Platelet Count: 252 10*3/uL (ref 150–400)
RBC: 3.27 MIL/uL — ABNORMAL LOW (ref 3.87–5.11)
RDW: 16.9 % — ABNORMAL HIGH (ref 11.5–15.5)
WBC Count: 17 10*3/uL — ABNORMAL HIGH (ref 4.0–10.5)
nRBC: 0 % (ref 0.0–0.2)

## 2020-08-19 MED ORDER — HEPARIN SOD (PORK) LOCK FLUSH 100 UNIT/ML IV SOLN
INTRAVENOUS | Status: AC
  Administered 2020-08-19: 500 [IU] via INTRAVENOUS
  Filled 2020-08-19: qty 5

## 2020-08-19 MED ORDER — IOHEXOL 300 MG/ML  SOLN
100.0000 mL | Freq: Once | INTRAMUSCULAR | Status: AC | PRN
  Administered 2020-08-19: 100 mL via INTRAVENOUS

## 2020-08-19 MED ORDER — HEPARIN SOD (PORK) LOCK FLUSH 100 UNIT/ML IV SOLN: 500.0000 [IU] | Freq: Once | INTRAVENOUS | Status: AC

## 2020-08-19 MED ORDER — SODIUM CHLORIDE 0.9% FLUSH
10.0000 mL | INTRAVENOUS | Status: DC | PRN
  Administered 2020-08-19: 10 mL via INTRAVENOUS
  Filled 2020-08-19: qty 10

## 2020-08-19 MED ORDER — IOHEXOL 9 MG/ML PO SOLN
ORAL | Status: AC
  Filled 2020-08-19: qty 1000

## 2020-08-19 NOTE — Patient Instructions (Signed)

## 2020-08-20 MED ORDER — CYANOCOBALAMIN 1000 MCG/ML IJ SOLN
INTRAMUSCULAR | Status: AC
  Filled 2020-08-20: qty 1

## 2020-08-20 MED ORDER — PROCHLORPERAZINE MALEATE 10 MG PO TABS
ORAL_TABLET | ORAL | Status: AC
  Filled 2020-08-20: qty 1

## 2020-08-21 ENCOUNTER — Telehealth: Payer: Self-pay | Admitting: Oncology

## 2020-08-21 ENCOUNTER — Inpatient Hospital Stay: Payer: 59

## 2020-08-21 ENCOUNTER — Inpatient Hospital Stay (HOSPITAL_BASED_OUTPATIENT_CLINIC_OR_DEPARTMENT_OTHER): Payer: 59 | Admitting: Oncology

## 2020-08-21 ENCOUNTER — Other Ambulatory Visit: Payer: Self-pay

## 2020-08-21 VITALS — BP 129/79 | HR 75 | Temp 97.6°F | Resp 18 | Ht 66.0 in | Wt 133.5 lb

## 2020-08-21 DIAGNOSIS — C250 Malignant neoplasm of head of pancreas: Secondary | ICD-10-CM

## 2020-08-21 DIAGNOSIS — Z23 Encounter for immunization: Secondary | ICD-10-CM

## 2020-08-21 DIAGNOSIS — Z5111 Encounter for antineoplastic chemotherapy: Secondary | ICD-10-CM | POA: Diagnosis not present

## 2020-08-21 MED ORDER — OXYCODONE-ACETAMINOPHEN 10-325 MG PO TABS
1.0000 | ORAL_TABLET | ORAL | 0 refills | Status: DC | PRN
Start: 2020-08-21 — End: 2020-09-24

## 2020-08-21 MED ORDER — MORPHINE SULFATE ER 15 MG PO TBCR: 15.0000 mg | EXTENDED_RELEASE_TABLET | Freq: Two times a day (BID) | ORAL | 0 refills | Status: DC

## 2020-08-21 NOTE — Progress Notes (Signed)
   Covid-19 Vaccination Clinic  Name:  DANNIEL GRENZ    MRN: [id number] DOB: [birth year]  08/21/2020  Ms. Sawyer was observed post Covid-19 immunization for 15 minutes without incident. She was provided with Vaccine Information Sheet and instruction to access the V-Safe system.   Ms. Cajas was instructed to call 911 with any severe reactions post vaccine: Marland Kitchen Difficulty breathing  . Swelling of face and throat  . A fast heartbeat  . A bad rash all over body  . Dizziness and weakness

## 2020-08-21 NOTE — Progress Notes (Signed)
Vicki [name] OFFICE PROGRESS NOTE   Diagnosis: Pancreas cancer  INTERVAL HISTORY:   Vicki [name] completed another cycle of FOLFIRI on 08/07/2020.  She had 1 episode of nausea on day 4.  No diarrhea.  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  Pain at the left lower back has increased.  She takes oxycodone approximately twice daily.  She continues MS Contin.  Objective:  Vital signs in last 24 hours:  Blood pressure 129/79, pulse 75, temperature 97.6 F (36.4 C), temperature source Tympanic, resp. rate 18, height 5\' 6"  (1.676 m), weight 133 lb 8 oz (60.6 kg), SpO2 100 %.    HEENT: No thrush or ulcers Resp: Lungs with scattered mild inspiratory wheeze and rhonchi, no respiratory distress Cardio: Regular rate and rhythm GI: Nontender, no mass, no hepatosplenomegaly Vascular: No leg edema    Portacath/PICC-without erythema  Lab Results:  Lab Results  Component Value Date   WBC 17.0 (H) 08/19/2020   HGB 10.2 (L) 08/19/2020   HCT 31.6 (L) 08/19/2020   MCV 96.6 08/19/2020   PLT 252 08/19/2020   NEUTROABS 16.0 (H) 08/19/2020    CMP  Lab Results  Component Value Date   NA 136 08/19/2020   K 4.0 08/19/2020   CL 104 08/19/2020   CO2 22 08/19/2020   GLUCOSE 193 (H) 08/19/2020   BUN 8 08/19/2020   CREATININE 0.68 08/19/2020   CALCIUM 9.2 08/19/2020   PROT 6.7 08/19/2020   ALBUMIN 3.7 08/19/2020   AST 33 08/19/2020   ALT 73 (H) 08/19/2020   ALKPHOS 266 (H) 08/19/2020   BILITOT 0.3 08/19/2020   GFRNONAA >60 08/19/2020   GFRAA >60 08/19/2020     Imaging:  CT CHEST W CONTRAST  Result Date: 08/19/2020 CLINICAL DATA:  Pancreatic cancer restaging EXAM: CT CHEST, ABDOMEN, AND PELVIS WITH CONTRAST TECHNIQUE: Multidetector CT imaging of the chest, abdomen and pelvis was performed following the standard protocol during bolus administration of intravenous contrast. CONTRAST:  125mL OMNIPAQUE IOHEXOL 300 MG/ML SOLN, additional oral enteric contrast  COMPARISON:  CT chest abdomen pelvis, 05/29/2020, PET-CT, 04/03/2020 FINDINGS: CT CHEST FINDINGS Cardiovascular: Right chest port catheter. Aortic atherosclerosis. Normal heart size. Three-vessel coronary artery calcifications. No pericardial effusion. Mediastinum/Nodes: No enlarged mediastinal, hilar, or axillary lymph nodes. Thyroid gland, trachea, and esophagus demonstrate no significant findings. Lungs/Pleura: Unchanged mild biapical pleuroparenchymal scarring. Mild bandlike scarring of the bilateral lung bases. Unchanged 4 mm nodule of the superior segment left lower lobe (series 12, image 50). No pleural effusion or pneumothorax. Musculoskeletal: No chest wall mass or suspicious bone lesions identified. CT ABDOMEN PELVIS FINDINGS Hepatobiliary: There is a new, very subtle hypodense lesion of the peripheral right lobe of the liver measuring approximately 5 mm (series 7, image 99). No gallstones or gallbladder wall thickening. Redemonstrated common bile duct stent with post stenting biliary ductal dilatation and pneumobilia. Pancreas: Pancreatic duct is abruptly obstructed in the pancreatic head with ductal dilatation up to 7 mm. There is minimal hypodensity and stranding of the posterior pancreatic head, mass difficult to discretely appreciate (series 7, image 108). Spleen: Normal in size without significant abnormality. Adrenals/Urinary Tract: Adrenal glands are unremarkable. Kidneys are normal, without renal calculi, solid lesion, or hydronephrosis. Bladder is unremarkable. Stomach/Bowel: Stomach is within normal limits. Appendix is not clearly visualized. No evidence of bowel wall thickening, distention, or inflammatory changes. Vascular/Lymphatic: Aortic atherosclerosis. Unchanged enlarged, stranded left retroperitoneal lymph node measuring approximately 1.2 x 1.2 cm (series 7, image 107). Reproductive: Status post hysterectomy. Other: No  abdominal wall hernia or abnormality. No abdominopelvic ascites.  Musculoskeletal: No acute or significant osseous findings. IMPRESSION: 1. There is a new, very subtle hypodense lesion of the peripheral right lobe of the liver measuring approximately 5 mm, concerning for new metastasis. Attention on follow-up. 2. Pancreatic duct is again of roughly obstructed in the pancreatic head. There is minimal hypodensity and stranding of the posterior pancreatic head, known mass difficult to discretely appreciate and not significantly changed compared to prior examination. 3. Redemonstrated common bile duct stent with post stenting biliary ductal dilatation and pneumobilia. 4. Unchanged enlarged, stranded left retroperitoneal lymph node measuring approximately 1.2 x 1.2 cm. Attention on follow-up. 5. No definite or new evidence of metastatic disease in the chest. Unchanged nonspecific 4 mm nodule of the superior segment left lower lobe, likely incidental sequelae of prior infection or inflammation. Attention on follow-up. 6. Coronary artery disease.  Aortic Atherosclerosis (ICD10-I70.0). Electronically Signed   By: Eddie Candle M.D.   On: 08/19/2020 17:01   CT ABDOMEN PELVIS W CONTRAST  Result Date: 08/19/2020 CLINICAL DATA:  Pancreatic cancer restaging EXAM: CT CHEST, ABDOMEN, AND PELVIS WITH CONTRAST TECHNIQUE: Multidetector CT imaging of the chest, abdomen and pelvis was performed following the standard protocol during bolus administration of intravenous contrast. CONTRAST:  157mL OMNIPAQUE IOHEXOL 300 MG/ML SOLN, additional oral enteric contrast COMPARISON:  CT chest abdomen pelvis, 05/29/2020, PET-CT, 04/03/2020 FINDINGS: CT CHEST FINDINGS Cardiovascular: Right chest port catheter. Aortic atherosclerosis. Normal heart size. Three-vessel coronary artery calcifications. No pericardial effusion. Mediastinum/Nodes: No enlarged mediastinal, hilar, or axillary lymph nodes. Thyroid gland, trachea, and esophagus demonstrate no significant findings. Lungs/Pleura: Unchanged mild biapical  pleuroparenchymal scarring. Mild bandlike scarring of the bilateral lung bases. Unchanged 4 mm nodule of the superior segment left lower lobe (series 12, image 50). No pleural effusion or pneumothorax. Musculoskeletal: No chest wall mass or suspicious bone lesions identified. CT ABDOMEN PELVIS FINDINGS Hepatobiliary: There is a new, very subtle hypodense lesion of the peripheral right lobe of the liver measuring approximately 5 mm (series 7, image 99). No gallstones or gallbladder wall thickening. Redemonstrated common bile duct stent with post stenting biliary ductal dilatation and pneumobilia. Pancreas: Pancreatic duct is abruptly obstructed in the pancreatic head with ductal dilatation up to 7 mm. There is minimal hypodensity and stranding of the posterior pancreatic head, mass difficult to discretely appreciate (series 7, image 108). Spleen: Normal in size without significant abnormality. Adrenals/Urinary Tract: Adrenal glands are unremarkable. Kidneys are normal, without renal calculi, solid lesion, or hydronephrosis. Bladder is unremarkable. Stomach/Bowel: Stomach is within normal limits. Appendix is not clearly visualized. No evidence of bowel wall thickening, distention, or inflammatory changes. Vascular/Lymphatic: Aortic atherosclerosis. Unchanged enlarged, stranded left retroperitoneal lymph node measuring approximately 1.2 x 1.2 cm (series 7, image 107). Reproductive: Status post hysterectomy. Other: No abdominal wall hernia or abnormality. No abdominopelvic ascites. Musculoskeletal: No acute or significant osseous findings. IMPRESSION: 1. There is a new, very subtle hypodense lesion of the peripheral right lobe of the liver measuring approximately 5 mm, concerning for new metastasis. Attention on follow-up. 2. Pancreatic duct is again of roughly obstructed in the pancreatic head. There is minimal hypodensity and stranding of the posterior pancreatic head, known mass difficult to discretely appreciate and  not significantly changed compared to prior examination. 3. Redemonstrated common bile duct stent with post stenting biliary ductal dilatation and pneumobilia. 4. Unchanged enlarged, stranded left retroperitoneal lymph node measuring approximately 1.2 x 1.2 cm. Attention on follow-up. 5. No definite or new evidence of  metastatic disease in the chest. Unchanged nonspecific 4 mm nodule of the superior segment left lower lobe, likely incidental sequelae of prior infection or inflammation. Attention on follow-up. 6. Coronary artery disease.  Aortic Atherosclerosis (ICD10-I70.0). Electronically Signed   By: Eddie Candle M.D.   On: 08/19/2020 17:01    Medications: I have reviewed the patient's current medications.   Assessment/Plan: 1. 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 MRI abdomen 02/01/2020-2.2 x 3.3 cm mass in the posterior pancreas head/uncinate process, mild intrahepatic/extrahepatic ductal dilatation, no evidence of vascular invasion, possible small lymph nodes in the porta hepatis-poorly visualized ? EUS 02/29/2020-20 x 23 mm mass in the pancreas head, upstream pancreatic duct dilatation, 1 abnormal peripancreatic node, uT3?uN1 ? 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 CTs 03/19/2020-2.9 x 2.1 x 3.1 cm pancreas head/uncinate mass, lesion associated with infrarenal abdominal aorta and superior mesenteric artery, prominent and borderline enlarged retroperitoneal nodes including a left periaortic node, indeterminate 4 mm lung nodule ? Cycle 1 mFOLFIRINOX 1/88/41, complicated by n/v and hospital admission 5/2/ - 5/3 ? Second opinion at [facility] with Dr. Earnestine Mealing and Dr. Mariah Milling 04/02/20  ? PET scan 04/03/2020-hypermetabolic poorly marginated pancreatic head mass; 2 hypermetabolic left periaortic lymph nodes, hypermetabolic left supraclavicular lymph node  ? Cycle 2 FOLFIRINOX 04/11/2020 ? Ultrasound-guided biopsy of a left supraclavicular  mass on 04/18/2020-poorly differentiated adenocarcinoma, cytokeratin 7+, cytokeratin 20 and CDX2 positive in rare cells. ? Cycle 3 FOLFIRINOX 04/24/2020-oxaliplatin infusion lengthened to 3 hours, Udenyca was not given (ordered) ? Cycle 4 FOLFIRINOX 05/08/2020 ? Cycle 5 FOLFIRINOX 05/22/2020-atropine and Ativan premedication added secondary to cholinergic symptoms and nausea from irinotecan ? CTs 05/29/2020-decreased left supraclavicular lymph node, slight decrease in size of the pancreas head mass, decrease in retroperitoneal lymphadenopathy ? Cycle 6 FOLFIRINOX 06/05/2020 ? Cycle 7 FOLFIRINOX 06/26/2020 ? Cycle 8 FOLFIRINOX 07/11/2020 (oxaliplatin held secondary to neuropathy) ? Cycle 9 FOLFIRINOX 07/24/2020 (oxaliplatin held secondary to neuropathy) ? Cycle 10 FOLFIRINOX 08/07/2020 (oxaliplatin held secondary to neuropathy) ? CTs 08/19/2020-subtle 5 mm peripheral right liver lesion, pancreas mass unchanged and difficult to discretely delineate, unchanged left retroperitoneal node, no evidence of disease progression, on my review of the CTs there is no change in the previously enlarged left supraclavicular node  2. Abdomen/back pain secondary to #1 3. Anorexia/weight loss secondary to #1 4. History of coronary artery disease,STEMI with the fib arrest 2011, status post RCA stent 5. Constipation, secondary to #1 and morphine  6.   Hospital admission for n/v after cycle 1 FOLFIRINOX 5/2 - 5/3 --prophylactic dexamethasone beginning day 2, Decadron dose increased beginning with cycle 4 FOLFIRINOX 7.  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 Ivie appears stable.  She has completed 10 cycles of chemotherapy.  Oxaliplatin has been held for the last 3 cycles of treatment.  She has persistent neuropathy symptoms.  The restaging CT shows no clear evidence of disease progression.  There is an  indeterminate right liver lesion.  The CA 19-9 is normal.  I reviewed the CT images and discussed treatment options with Ms. Dinse.  We discussed a treatment break, "maintenance "therapy, and switching to a different systemic therapy regimen.  I recommend continuing FOLFIRI.  The plan is to continue FOLFIRI on a 3-week schedule.  She would like to delay the next cycle of FOLFIRI until 09/11/2020.  She will return for an office visit that  day.  I refilled prescriptions for MS Contin and oxycodone.  Vicki Coder, MD  08/21/2020  9:22 AM

## 2020-08-21 NOTE — Telephone Encounter (Signed)
Scheduled per 9/22 los. Pt requested early mornings. Scheduled pt on 10/14 per pt request. Messaged GBS so he can be aware of appts scheduled.

## 2020-08-22 ENCOUNTER — Telehealth: Payer: Self-pay | Admitting: *Deleted

## 2020-08-22 NOTE — Telephone Encounter (Signed)
Faxed physician statement form and medical records from 1st visit to 08/20/20 to [place] [phone] as requested by patient. She reports that [employer] reports they did not receive all the records sent by HIM on 05/2820. Called [employer] and confirmed all records were received. Patient notified and wants her disability to be extended. Informed her that MD put 1 year to indefinite disability.

## 2020-08-23 ENCOUNTER — Other Ambulatory Visit: Payer: Self-pay | Admitting: Cardiovascular Disease

## 2020-09-05 ENCOUNTER — Emergency Department (HOSPITAL_COMMUNITY): Payer: Medicare Other

## 2020-09-05 ENCOUNTER — Encounter (HOSPITAL_COMMUNITY): Payer: Self-pay

## 2020-09-05 ENCOUNTER — Other Ambulatory Visit: Payer: Self-pay

## 2020-09-05 ENCOUNTER — Inpatient Hospital Stay (HOSPITAL_COMMUNITY)
Admission: EM | Admit: 2020-09-05 | Discharge: 2020-09-08 | DRG: 919 | Disposition: A | Discharge status: Home / Self Care | Payer: Medicare Other | Attending: Internal Medicine | Admitting: Internal Medicine

## 2020-09-05 DIAGNOSIS — Z7982 Long term (current) use of aspirin: Secondary | ICD-10-CM

## 2020-09-05 DIAGNOSIS — Z79891 Long term (current) use of opiate analgesic: Secondary | ICD-10-CM

## 2020-09-05 DIAGNOSIS — Z9221 Personal history of antineoplastic chemotherapy: Secondary | ICD-10-CM

## 2020-09-05 DIAGNOSIS — Z20822 Contact with and (suspected) exposure to covid-19: Secondary | ICD-10-CM | POA: Diagnosis present

## 2020-09-05 DIAGNOSIS — Z91041 Radiographic dye allergy status: Secondary | ICD-10-CM

## 2020-09-05 DIAGNOSIS — Z87891 Personal history of nicotine dependence: Secondary | ICD-10-CM

## 2020-09-05 DIAGNOSIS — Z9689 Presence of other specified functional implants: Secondary | ICD-10-CM | POA: Diagnosis present

## 2020-09-05 DIAGNOSIS — R109 Unspecified abdominal pain: Secondary | ICD-10-CM | POA: Diagnosis not present

## 2020-09-05 DIAGNOSIS — E876 Hypokalemia: Secondary | ICD-10-CM | POA: Diagnosis present

## 2020-09-05 DIAGNOSIS — E785 Hyperlipidemia, unspecified: Secondary | ICD-10-CM | POA: Diagnosis present

## 2020-09-05 DIAGNOSIS — K8309 Other cholangitis: Secondary | ICD-10-CM | POA: Diagnosis present

## 2020-09-05 DIAGNOSIS — T40605A Adverse effect of unspecified narcotics, initial encounter: Secondary | ICD-10-CM | POA: Diagnosis present

## 2020-09-05 DIAGNOSIS — Z8507 Personal history of malignant neoplasm of pancreas: Secondary | ICD-10-CM

## 2020-09-05 DIAGNOSIS — Z90710 Acquired absence of both cervix and uterus: Secondary | ICD-10-CM

## 2020-09-05 DIAGNOSIS — Z91048 Other nonmedicinal substance allergy status: Secondary | ICD-10-CM

## 2020-09-05 DIAGNOSIS — D638 Anemia in other chronic diseases classified elsewhere: Secondary | ICD-10-CM | POA: Diagnosis present

## 2020-09-05 DIAGNOSIS — Z8249 Family history of ischemic heart disease and other diseases of the circulatory system: Secondary | ICD-10-CM

## 2020-09-05 DIAGNOSIS — Z803 Family history of malignant neoplasm of breast: Secondary | ICD-10-CM

## 2020-09-05 DIAGNOSIS — T85590A Other mechanical complication of bile duct prosthesis, initial encounter: Secondary | ICD-10-CM | POA: Diagnosis not present

## 2020-09-05 DIAGNOSIS — K831 Obstruction of bile duct: Secondary | ICD-10-CM

## 2020-09-05 DIAGNOSIS — Z7902 Long term (current) use of antithrombotics/antiplatelets: Secondary | ICD-10-CM

## 2020-09-05 DIAGNOSIS — Z8674 Personal history of sudden cardiac arrest: Secondary | ICD-10-CM

## 2020-09-05 DIAGNOSIS — K5903 Drug induced constipation: Secondary | ICD-10-CM | POA: Diagnosis present

## 2020-09-05 DIAGNOSIS — I10 Essential (primary) hypertension: Secondary | ICD-10-CM | POA: Diagnosis present

## 2020-09-05 DIAGNOSIS — J302 Other seasonal allergic rhinitis: Secondary | ICD-10-CM | POA: Diagnosis present

## 2020-09-05 DIAGNOSIS — C259 Malignant neoplasm of pancreas, unspecified: Secondary | ICD-10-CM | POA: Diagnosis present

## 2020-09-05 DIAGNOSIS — Y838 Other surgical procedures as the cause of abnormal reaction of the patient, or of later complication, without mention of misadventure at the time of the procedure: Secondary | ICD-10-CM | POA: Diagnosis present

## 2020-09-05 DIAGNOSIS — Z955 Presence of coronary angioplasty implant and graft: Secondary | ICD-10-CM

## 2020-09-05 DIAGNOSIS — Z90722 Acquired absence of ovaries, bilateral: Secondary | ICD-10-CM

## 2020-09-05 DIAGNOSIS — I252 Old myocardial infarction: Secondary | ICD-10-CM

## 2020-09-05 DIAGNOSIS — Z79899 Other long term (current) drug therapy: Secondary | ICD-10-CM

## 2020-09-05 DIAGNOSIS — Z9079 Acquired absence of other genital organ(s): Secondary | ICD-10-CM

## 2020-09-05 DIAGNOSIS — Z23 Encounter for immunization: Secondary | ICD-10-CM

## 2020-09-05 DIAGNOSIS — I2510 Atherosclerotic heart disease of native coronary artery without angina pectoris: Secondary | ICD-10-CM | POA: Diagnosis present

## 2020-09-05 LAB — COMPREHENSIVE METABOLIC PANEL
ALT: 113 U/L — ABNORMAL HIGH (ref 0–44)
AST: 131 U/L — ABNORMAL HIGH (ref 15–41)
Albumin: 3.6 g/dL (ref 3.5–5.0)
Alkaline Phosphatase: 335 U/L — ABNORMAL HIGH (ref 38–126)
Anion gap: 11 (ref 5–15)
BUN: 20 mg/dL (ref 8–23)
CO2: 24 mmol/L (ref 22–32)
Calcium: 8.9 mg/dL (ref 8.9–10.3)
Chloride: 100 mmol/L (ref 98–111)
Creatinine, Ser: 0.57 mg/dL (ref 0.44–1.00)
GFR calc non Af Amer: >60 mL/min (ref >60)
Glucose, Bld: 109 mg/dL — ABNORMAL HIGH (ref 70–99)
Potassium: 3.2 mmol/L — ABNORMAL LOW (ref 3.5–5.1)
Sodium: 135 mmol/L (ref 135–145)
Total Bilirubin: 5.6 mg/dL — ABNORMAL HIGH (ref 0.3–1.2)
Total Protein: 6.5 g/dL (ref 6.5–8.1)

## 2020-09-05 LAB — CBC
HCT: 31.3 % — ABNORMAL LOW (ref 36.0–46.0)
Hemoglobin: 10.1 g/dL — ABNORMAL LOW (ref 12.0–15.0)
MCH: 31.1 pg (ref 26.0–34.0)
MCHC: 32.3 g/dL (ref 30.0–36.0)
MCV: 96.3 fL (ref 80.0–100.0)
Platelets: 218 10*3/uL (ref 150–400)
RBC: 3.25 MIL/uL — ABNORMAL LOW (ref 3.87–5.11)
RDW: 16.4 % — ABNORMAL HIGH (ref 11.5–15.5)
WBC: 33.4 10*3/uL — ABNORMAL HIGH (ref 4.0–10.5)
nRBC: 0 % (ref 0.0–0.2)

## 2020-09-05 LAB — DIFFERENTIAL
Abs Immature Granulocytes: 0.51 10*3/uL — ABNORMAL HIGH (ref 0.00–0.07)
Basophils Absolute: 0 10*3/uL (ref 0.0–0.1)
Basophils Relative: 0 %
Eosinophils Absolute: 0 10*3/uL (ref 0.0–0.5)
Eosinophils Relative: 0 %
Immature Granulocytes: 2 %
Lymphocytes Relative: 1 %
Lymphs Abs: 0.3 10*3/uL — ABNORMAL LOW (ref 0.7–4.0)
Monocytes Absolute: 1 10*3/uL (ref 0.1–1.0)
Monocytes Relative: 3 %
Neutro Abs: 31.1 10*3/uL — ABNORMAL HIGH (ref 1.7–7.7)
Neutrophils Relative %: 94 %

## 2020-09-05 LAB — URINALYSIS, ROUTINE W REFLEX MICROSCOPIC
Bacteria, UA: NONE SEEN
Glucose, UA: NEGATIVE mg/dL
Hgb urine dipstick: NEGATIVE
Ketones, ur: 20 mg/dL — AB
Leukocytes,Ua: NEGATIVE
Nitrite: NEGATIVE
Protein, ur: 30 mg/dL — AB
Specific Gravity, Urine: 1.028 (ref 1.005–1.030)
pH: 5 (ref 5.0–8.0)

## 2020-09-05 LAB — LIPASE, BLOOD: Lipase: 20 U/L (ref 11–51)

## 2020-09-05 MED ORDER — HYDROCORTISONE NA SUCCINATE PF 250 MG IJ SOLR
200.0000 mg | Freq: Once | INTRAMUSCULAR | Status: AC
  Administered 2020-09-05: 200 mg via INTRAVENOUS
  Filled 2020-09-05: qty 200

## 2020-09-05 MED ORDER — HYDROMORPHONE HCL 1 MG/ML IJ SOLN
1.0000 mg | Freq: Once | INTRAMUSCULAR | Status: AC
  Administered 2020-09-05: 1 mg via INTRAVENOUS
  Filled 2020-09-05: qty 1

## 2020-09-05 MED ORDER — ONDANSETRON HCL 4 MG/2ML IJ SOLN
4.0000 mg | Freq: Once | INTRAMUSCULAR | Status: AC
  Administered 2020-09-05: 4 mg via INTRAVENOUS
  Filled 2020-09-05: qty 2

## 2020-09-05 MED ORDER — SODIUM CHLORIDE 0.9 % IV BOLUS
1000.0000 mL | Freq: Once | INTRAVENOUS | Status: AC
  Administered 2020-09-05: 1000 mL via INTRAVENOUS

## 2020-09-05 MED ORDER — DIPHENHYDRAMINE HCL 50 MG/ML IJ SOLN
50.0000 mg | Freq: Once | INTRAMUSCULAR | Status: AC
  Administered 2020-09-06: 50 mg via INTRAVENOUS
  Filled 2020-09-05: qty 1

## 2020-09-05 NOTE — ED Triage Notes (Signed)
Patient arrived with complaints of NV and generalized body aches since noon yesterday. Last dose of Zofran about an hour ago.

## 2020-09-05 NOTE — ED Provider Notes (Signed)
[facility] DEPT Provider Note   CSN: [id number] Arrival date & time: 09/05/20  2127     History Chief Complaint  Patient presents with  . Emesis  . Generalized Body Aches    Vicki [name] is a 64 y.o. female.  64 y/o female with hx of CAD s/p PCI to RCA, HTN, HLD, and pancreatic cancer on FOLFIRI q 3 weeks (last on 08/07/20 and held until 09/11/20) presents to the ED for abdominal pain, N/V. Patient reports onset of diffuse abdominal pain and nausea after eating lunch yesterday, followed by 3-4 episodes of emesis throughout the afternoon. Patient took Zofran and Percocet with improvement in symptoms overnight; however, abdominal pain and vomiting resumed after eating breakfast today. Patient states that abdominal pain has been constant since this morning, with multiple episodes of emesis. Patient reports intermittent constipation since beginning chemotherapy, managed with a stool softener and Miralax as needed. Most recent BM was 2-4 days ago; has difficulty recalling exact day. No fever/chills, chest pain, SOB, cough, melena, hematochezia. Patient has received COVID-19 vaccines and booster. Prior surgical hx significant for TAH, appendectomy, hernia repair x 2.  The history is provided by the patient. No language interpreter was used.  Emesis      Past Medical History:  Diagnosis Date  . CAD (coronary artery disease) 2011   with stent to RCA and 60 % LAD disease  . Clotting disorder ([place])   . Family history of breast cancer   . Family history of cystic fibrosis   . H/O cardiac arrest 02/15/10   with STEMI- Inf wall  . Hyperlipidemia   . Hypertension   . Myocardial infarct ([place]) 02/15/10  . pancreatic ca dx'd 02/2020  . Shock, cardiogenic ([place]) 01/2010   with MI, IABP    Patient Active Problem List   Diagnosis Date Noted  . Abdominal pain 09/06/2020  . Genetic testing 04/03/2020  . Chemotherapy induced nausea and vomiting 04/01/2020  .  Hypokalemia due to excessive gastrointestinal loss of potassium 04/01/2020  . Pancreatic cancer ([place]) 03/20/2020  . Goals of care, counseling/discussion 03/20/2020  . Family history of breast cancer   . Family history of cystic fibrosis   . Foot pain, right 11/25/2018  . Collagenous colitis 02/08/2018  . Family history of Alzheimer's disease 02/08/2018  . Psoriasis 02/12/2015  . 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 Bradycardia, symptomatic with fatigue 09/13/2013  . Low back pain 02/23/2013  . Coronary artery disease involving native coronary artery of native heart without angina pectoris 05/30/2012  . Physical exam 05/04/2012  . Allergic rhinitis, seasonal 03/25/2012  . Hyperlipidemia 06/19/2010  . Essential hypertension 06/19/2010  . MYOCARDIAL INFARCTION, HX OF 06/19/2010    Past Surgical History:  Procedure Laterality Date  . ABDOMINAL HYSTERECTOMY  1987   BSO  . APPENDECTOMY  1987  . BILIARY STENT PLACEMENT N/A 03/05/2020   Procedure: BILIARY STENT PLACEMENT;  Surgeon: Clarene Essex, MD;  Location: [facility];  Service: Endoscopy;  Laterality: N/A;  . CORONARY ANGIOPLASTY WITH STENT PLACEMENT  02/15/10   Stent to prox RCA -BMS  . DOPPLER ECHOCARDIOGRAPHY  05/06/2010   EF =50-55%  lvfx low normal ;no sigificant valvular disease seen  . ENDOSCOPIC RETROGRADE CHOLANGIOPANCREATOGRAPHY (ERCP) WITH PROPOFOL N/A 02/29/2020   Procedure: ENDOSCOPIC RETROGRADE CHOLANGIOPANCREATOGRAPHY (ERCP) WITH PROPOFOL;  Surgeon: Arta Silence, MD;  Location: [facility];  Service: Endoscopy;  Laterality: N/A;  . ERCP N/A 03/05/2020   Procedure: ENDOSCOPIC RETROGRADE CHOLANGIOPANCREATOGRAPHY (ERCP);  Surgeon:  Clarene Essex, MD;  Location: [facility];  Service: Endoscopy;  Laterality: N/A;  with stent placement  . ESOPHAGOGASTRODUODENOSCOPY (EGD) WITH PROPOFOL N/A 02/29/2020   Procedure: ESOPHAGOGASTRODUODENOSCOPY (EGD) WITH PROPOFOL;  Surgeon: Arta Silence, MD;  Location: [facility];  Service: Endoscopy;  Laterality: N/A;  . EXPLORATORY LAPAROTOMY    . FINE NEEDLE ASPIRATION  02/29/2020   Procedure: FINE NEEDLE ASPIRATION;  Surgeon: Arta Silence, MD;  Location: [facility];  Service: Endoscopy;;  . HERNIA REPAIR  1980   double bilateral  . IR IMAGING GUIDED PORT INSERTION  03/26/2020  . MASS EXCISION  09/02/2012   Procedure: EXCISION MASS;  Surgeon: Earnstine Regal, MD;  Location: [facility];  Service: General;  Laterality: Left;  Excise mass Left posterior neck  . NM MYOCAR PERF WALL MOTION  05/06/2010   exercise cap 8 mets. ,mild perfusion defect in basal infer.,mid infer., apical infer., region consistent with infarct/scar. no significant ischemia  . PANCREATIC STENT PLACEMENT  02/29/2020   Procedure: PANCREATIC STENT PLACEMENT;  Surgeon: Arta Silence, MD;  Location: [facility];  Service: Endoscopy;;  . right chest wall exploration    . SPHINCTEROTOMY  02/29/2020   Procedure: SPHINCTEROTOMY;  Surgeon: Arta Silence, MD;  Location: [facility];  Service: Endoscopy;;  . [name]  03/05/2020   Procedure: [name];  Surgeon: Clarene Essex, MD;  Location: [facility];  Service: Endoscopy;;  . UPPER ESOPHAGEAL ENDOSCOPIC ULTRASOUND (EUS) N/A 02/29/2020   Procedure: UPPER ESOPHAGEAL ENDOSCOPIC ULTRASOUND (EUS)  LINEAR SCOPE;  Surgeon: Arta Silence, MD;  Location[facility];  Service: Endoscopy;  Laterality: N/A;  DR. NEEDS TWO HOURS FOR PROCEDURE     OB History   No obstetric history on file.     Family History  Problem Relation Age of Onset  . Breast cancer Mother 49  . Dementia Mother   . Cystic fibrosis Father 28       late onset dx  . Healthy Brother   . Dementia Maternal Aunt   . Dementia Maternal Uncle   . Other Paternal Uncle        MVA while in the [employer]  . Arthritis Maternal Grandmother   . AAA (abdominal aortic aneurysm) Maternal Grandfather   . Other Paternal 67        old age  . Heart attack  Paternal Grandfather   . Liver disease Paternal Uncle   . Pancreatitis Son        several bouts of pancreatitis  . Breast cancer Cousin 40       mat first cousin  . Colon cancer Neg Hx     Social History   Tobacco Use  . Smoking status: Former Smoker    Quit date: 01/28/2010    Years since quitting: 10.6  . Smokeless tobacco: Never Used  Vaping Use  . Vaping Use: Never used  Substance Use Topics  . Alcohol use: Yes    Alcohol/week: 1.0 - 2.0 standard drink    Types: 1 - 2 Glasses of wine per week  . Drug use: No    Home Medications Prior to Admission medications   Medication Sig Start Date End Date Taking? Authorizing Provider  ALPRAZolam (XANAX) 0.25 MG tablet Take 1 tablet (0.25 mg total) by mouth at bedtime. 04/09/20  Yes Midge Minium, MD  aspirin 81 MG tablet Take 81 mg by mouth at bedtime.    Yes [provider]  cetirizine (ZYRTEC) 10 MG tablet Take 10 mg by mouth daily.  Yes [provider]  Cholecalciferol (VITAMIN D3) 5000 units CAPS Take 5,000 Units by mouth at bedtime.    Yes [provider]  clopidogrel (PLAVIX) 75 MG tablet Take 75 mg by mouth daily.   Yes [provider]  [facility] Liver Oil 5000-500 UNIT/5ML OIL Take 1 capsule by mouth daily.   Yes [provider]  dexamethasone (DECADRON) 4 MG tablet Take 2 tablets (8 mg total) by mouth 2 (two) times daily. X 3 days after each chemo. Start on day 2 06/26/20  Yes Ladell Pier, MD  escitalopram (LEXAPRO) 10 MG tablet TAKE 1 TABLET(10 MG) BY MOUTH DAILY Patient taking differently: Take 10 mg by mouth daily.  07/08/20  Yes Midge Minium, MD  metoprolol tartrate (LOPRESSOR) 50 MG tablet TAKE 1 TABLET(50 MG) BY MOUTH TWICE DAILY Patient taking differently: Take 25 mg by mouth 2 (two) times daily.  08/26/20  Yes Lorretta Harp, MD  montelukast (SINGULAIR) 10 MG tablet TAKE 1 TABLET(10 MG) BY MOUTH AT BEDTIME Patient taking differently: Take 10 mg by mouth at  bedtime.  06/24/20  Yes Midge Minium, MD  morphine (MS CONTIN) 15 MG 12 hr tablet Take 1 tablet (15 mg total) by mouth every 12 (twelve) hours. 08/21/20  Yes Ladell Pier, MD  nitroGLYCERIN (NITROSTAT) 0.4 MG SL tablet PLACE 1 TABLET UNDER THE TONGUE EVERY 5 MINUTES AS NEEDED FOR CHEST PAIN 11/29/19  Yes Lorretta Harp, MD  ondansetron (ZOFRAN) 8 MG tablet Take 1 tablet (8 mg total) by mouth every 8 (eight) hours as needed for nausea or vomiting. 03/12/20  Yes Ladell Pier, MD  oxyCODONE-acetaminophen (PERCOCET) 10-325 MG tablet Take 1 tablet by mouth every 4 (four) hours as needed for pain. 08/21/20  Yes Ladell Pier, MD  pantoprazole (PROTONIX) 40 MG tablet Take 1 tablet (40 mg total) by mouth 2 (two) times daily. 11/30/19  Yes Lorretta Harp, MD  polyethylene glycol (MIRALAX / GLYCOLAX) 17 g packet Take 17 g by mouth daily as needed for mild constipation or moderate constipation.    Yes [provider]  potassium chloride SA (KLOR-CON) 20 MEQ tablet TAKE 1 TABLET(20 MEQ) BY MOUTH DAILY Patient taking differently: Take 20 mEq by mouth daily.  03/15/20  Yes Lorretta Harp, MD  rosuvastatin (CRESTOR) 10 MG tablet TAKE 1 TABLET(10 MG) BY MOUTH DAILY Patient taking differently: Take 10 mg by mouth daily.  01/03/20  Yes Lorretta Harp, MD  lisinopril (ZESTRIL) 5 MG tablet Take 1 tablet (5 mg total) by mouth daily. Patient not taking: Reported on 07/24/2020 11/29/19   Abigail Butts., PA-C  predniSONE (DELTASONE) 50 MG tablet Take #1 tablet 13 hours, 7 hours and 1 hour prior to CT scan. Take Benadryl 50 mg with 1 hour dose Patient not taking: Reported on 08/21/2020 08/07/20   Ladell Pier, MD  prochlorperazine (COMPAZINE) 10 MG tablet Take 1 tablet (10 mg total) by mouth every 6 (six) hours as needed for nausea. Patient not taking: Reported on 07/24/2020 03/25/20   Ladell Pier, MD    Allergies    Chlorhexidine and Contrast media [iodinated diagnostic  agents]  Review of Systems   Review of Systems  Gastrointestinal: Positive for vomiting.  Ten systems reviewed and are negative for acute change, except as noted in the HPI.    Physical Exam Updated Vital Signs BP 129/71   Pulse 65   Temp 97.7 F (36.5 C) (Oral)   Resp 15  SpO2 93%   Physical Exam Vitals and nursing note reviewed.  Constitutional:      General: She is not in acute distress.    Appearance: She is well-developed. She is not diaphoretic.     Comments: Appears uncomfortable; hunched over  HENT:     Head: Normocephalic and atraumatic.  Eyes:     General: No scleral icterus.    Conjunctiva/sclera: Conjunctivae normal.  Cardiovascular:     Rate and Rhythm: Normal rate and regular rhythm.     Pulses: Normal pulses.  Pulmonary:     Effort: Pulmonary effort is normal. No respiratory distress.     Comments: Respirations even and unlabored. Abdominal:     Palpations: Abdomen is soft. There is no mass.     Tenderness: There is abdominal tenderness.     Hernia: No hernia is present.     Comments: TTP across the upper abdomen, worse in the epigastrium with guarding. No distension or peritoneal signs.  Musculoskeletal:        General: Normal range of motion.     Cervical back: Normal range of motion.  Skin:    General: Skin is warm and dry.     Coloration: Skin is not pale.     Findings: No erythema or rash.  Neurological:     Mental Status: She is alert and oriented to person, place, and time.  Psychiatric:        Behavior: Behavior normal.     ED Results / Procedures / Treatments   Labs (all labs ordered are listed, but only abnormal results are displayed) Labs Reviewed  COMPREHENSIVE METABOLIC PANEL - Abnormal; Notable for the following components:      Result Value   Potassium 3.2 (*)    Glucose, Bld 109 (*)    AST 131 (*)    ALT 113 (*)    Alkaline Phosphatase 335 (*)    Total Bilirubin 5.6 (*)    All other components within normal limits  CBC  - Abnormal; Notable for the following components:   WBC 33.4 (*)    RBC 3.25 (*)    Hemoglobin 10.1 (*)    HCT 31.3 (*)    RDW 16.4 (*)    All other components within normal limits  URINALYSIS, ROUTINE W REFLEX MICROSCOPIC - Abnormal; Notable for the following components:   Color, Urine AMBER (*)    APPearance HAZY (*)    Bilirubin Urine MODERATE (*)    Ketones, ur 20 (*)    Protein, ur 30 (*)    All other components within normal limits  DIFFERENTIAL - Abnormal; Notable for the following components:   Neutro Abs 31.1 (*)    Lymphs Abs 0.3 (*)    Abs Immature Granulocytes 0.51 (*)    All other components within normal limits  RESPIRATORY PANEL BY RT PCR (FLU A&B, COVID)  LIPASE, BLOOD    EKG None  Radiology CT ABDOMEN PELVIS W CONTRAST  Result Date: 09/06/2020 CLINICAL DATA:  Pancreas cancer.  Nausea with vomiting EXAM: CT ABDOMEN AND PELVIS WITH CONTRAST TECHNIQUE: Multidetector CT imaging of the abdomen and pelvis was performed using the standard protocol following bolus administration of intravenous contrast. CONTRAST:  125m OMNIPAQUE IOHEXOL 300 MG/ML  SOLN COMPARISON:  08/19/2020 FINDINGS: Lower chest: Lobular septal thickening at the bases. Coronary atherosclerosis. Hepatobiliary: No focal liver abnormality.Bile duct dilatation and decrease in pneumobilia. CBD stent is in stable position with distal gas, proximal fluid, in midportion more intermediate density, see coronal  reformats. Pancreas: Pancreas adenocarcinoma with mass at the head/uncinate and main duct dilatation pancreatic body atrophy. Wispy soft tissue density about the pancreas, similar to before. There is a left periaortic lymph node the level of the left renal hilum with interval vague low-density center, similar in size to before at 16 mm. Spleen: Unremarkable. Adrenals/Urinary Tract: Negative adrenals. No hydronephrosis or stone. Unremarkable bladder. Stomach/Bowel: No obstruction. Appendectomy. No visible bowel  inflammation Vascular/Lymphatic: No acute vascular abnormality. Multifocal atheromatous calcification. Reproductive:Hysterectomy. Other: No ascites or pneumoperitoneum. Musculoskeletal: No acute abnormalities. Remote T12 compression fracture. Lower lumbar disc and facet degeneration. Lower thoracic disc degeneration with advanced height loss. IMPRESSION: 1. Metallic biliary stent remains in good position but there is increased intrahepatic bile duct dilatation and decreased pneumobilia. Some intermediate density contents are present in the mid stent, possible stent obstruction. 2. Pancreas carcinoma which was recently re-staged. The enlarged left periaortic node show some internal cavitation since that prior, an adverse finding. Electronically Signed   By: Monte Fantasia M.D.   On: 09/06/2020 04:09    Procedures Procedures (including critical care time)  Medications Ordered in ED Medications  sodium chloride (PF) 0.9 % injection (  Not Given 09/06/20 0326)  sodium chloride 0.9 % bolus 1,000 mL (0 mLs Intravenous Stopped 09/06/20 0106)  ondansetron (ZOFRAN) injection 4 mg (4 mg Intravenous Given 09/05/20 2255)  HYDROmorphone (DILAUDID) injection 1 mg (1 mg Intravenous Given 09/05/20 2256)  hydrocortisone sodium succinate (SOLU-CORTEF) injection 200 mg (200 mg Intravenous Given 09/05/20 2312)  diphenhydrAMINE (BENADRYL) injection 50 mg (50 mg Intravenous Given 09/06/20 0205)  HYDROmorphone (DILAUDID) injection 1 mg (1 mg Intravenous Given 09/05/20 2357)  pantoprazole (PROTONIX) injection 40 mg (40 mg Intravenous Given 09/06/20 0104)  sodium chloride 0.9 % bolus 500 mL (0 mLs Intravenous Stopped 09/06/20 0342)  HYDROmorphone (DILAUDID) injection 1 mg (1 mg Intravenous Given 09/06/20 0346)  iohexol (OMNIPAQUE) 300 MG/ML solution 100 mL (100 mLs Intravenous Contrast Given 09/06/20 0326)  diphenhydrAMINE (BENADRYL) injection 25 mg (25 mg Intravenous Given 09/06/20 0344)    ED Course  I have reviewed the triage  vital signs and the nursing notes.  Pertinent labs & imaging results that were available during my care of the patient were reviewed by me and considered in my medical decision making (see chart for details).  Clinical Course as of Sep 06 450  Thu Sep 05, 2020  2353 Patient appears more comfortable. Reports little improvement in pain with 24m IV Dilaudid. Will give additional dose. Is on chronic Morphine tablets and PRN oxycodone which is likely contributing to opiate tolerance.   [KH]  Fri Sep 06, 2020  0120 CT ABDOMEN PELVIS W CONTRAST [KL]  0121 Differential(!) [KL]    Clinical Course User Index [KH] HAntonietta Breach PA-C [KL] LMaretta Los  MDM Rules/Calculators/A&P                          64year old female with a history of pancreatic cancer presents to the emergency department for 2 days of upper abdominal pain with nausea and vomiting.  Has leukocytosis at baseline which is further elevated to 33.4 today.  May be from stress of persistent pain, N/V as no criteria for SIRS/Sepsis.  Denies fever.  Also with changes to LFTs compared to 2 weeks ago.  AST 33>131, ALT 73>113, Alk phos 266>335 and Tbili 0.3>5.6.  LFT changes suspected 2/2 findings suspicion for biliary stent obstruction of abdominal CT.  Plan for admission  for symptoms control, further evaluation. GI notified of need for AM consultation. Dr. Hal Hope to admit.   Final Clinical Impression(s) / ED Diagnoses Final diagnoses:  Obstruction of biliary stent, initial encounter    Rx / DC Orders ED Discharge Orders    None       Antonietta Breach, PA-C 09/06/20 0451    Maudie Flakes, MD 09/06/20 [phone]

## 2020-09-06 ENCOUNTER — Encounter (HOSPITAL_COMMUNITY)
Admission: EM | Disposition: A | Discharge status: Home / Self Care | Payer: Self-pay | Source: Home / Self Care | Attending: Internal Medicine

## 2020-09-06 ENCOUNTER — Inpatient Hospital Stay (HOSPITAL_COMMUNITY): Payer: Medicare Other | Admitting: Certified Registered Nurse Anesthetist

## 2020-09-06 ENCOUNTER — Inpatient Hospital Stay (HOSPITAL_COMMUNITY): Payer: Medicare Other

## 2020-09-06 ENCOUNTER — Emergency Department (HOSPITAL_COMMUNITY): Payer: Medicare Other

## 2020-09-06 ENCOUNTER — Encounter (HOSPITAL_COMMUNITY): Payer: Self-pay

## 2020-09-06 DIAGNOSIS — D638 Anemia in other chronic diseases classified elsewhere: Secondary | ICD-10-CM | POA: Diagnosis present

## 2020-09-06 DIAGNOSIS — Z8249 Family history of ischemic heart disease and other diseases of the circulatory system: Secondary | ICD-10-CM | POA: Diagnosis not present

## 2020-09-06 DIAGNOSIS — Z803 Family history of malignant neoplasm of breast: Secondary | ICD-10-CM | POA: Diagnosis not present

## 2020-09-06 DIAGNOSIS — I10 Essential (primary) hypertension: Secondary | ICD-10-CM

## 2020-09-06 DIAGNOSIS — Z87891 Personal history of nicotine dependence: Secondary | ICD-10-CM | POA: Diagnosis not present

## 2020-09-06 DIAGNOSIS — Z23 Encounter for immunization: Secondary | ICD-10-CM | POA: Diagnosis present

## 2020-09-06 DIAGNOSIS — Z9221 Personal history of antineoplastic chemotherapy: Secondary | ICD-10-CM | POA: Diagnosis not present

## 2020-09-06 DIAGNOSIS — R1013 Epigastric pain: Secondary | ICD-10-CM

## 2020-09-06 DIAGNOSIS — T85590A Other mechanical complication of bile duct prosthesis, initial encounter: Secondary | ICD-10-CM | POA: Diagnosis present

## 2020-09-06 DIAGNOSIS — E876 Hypokalemia: Secondary | ICD-10-CM | POA: Diagnosis present

## 2020-09-06 DIAGNOSIS — K8309 Other cholangitis: Secondary | ICD-10-CM | POA: Diagnosis present

## 2020-09-06 DIAGNOSIS — Z8507 Personal history of malignant neoplasm of pancreas: Secondary | ICD-10-CM | POA: Diagnosis not present

## 2020-09-06 DIAGNOSIS — K5903 Drug induced constipation: Secondary | ICD-10-CM | POA: Diagnosis present

## 2020-09-06 DIAGNOSIS — Y838 Other surgical procedures as the cause of abnormal reaction of the patient, or of later complication, without mention of misadventure at the time of the procedure: Secondary | ICD-10-CM | POA: Diagnosis present

## 2020-09-06 DIAGNOSIS — R109 Unspecified abdominal pain: Secondary | ICD-10-CM | POA: Diagnosis present

## 2020-09-06 DIAGNOSIS — J302 Other seasonal allergic rhinitis: Secondary | ICD-10-CM | POA: Diagnosis present

## 2020-09-06 DIAGNOSIS — Z20822 Contact with and (suspected) exposure to covid-19: Secondary | ICD-10-CM | POA: Diagnosis present

## 2020-09-06 DIAGNOSIS — E785 Hyperlipidemia, unspecified: Secondary | ICD-10-CM | POA: Diagnosis present

## 2020-09-06 DIAGNOSIS — Z90722 Acquired absence of ovaries, bilateral: Secondary | ICD-10-CM | POA: Diagnosis not present

## 2020-09-06 DIAGNOSIS — Z8674 Personal history of sudden cardiac arrest: Secondary | ICD-10-CM | POA: Diagnosis not present

## 2020-09-06 DIAGNOSIS — C259 Malignant neoplasm of pancreas, unspecified: Secondary | ICD-10-CM | POA: Diagnosis present

## 2020-09-06 DIAGNOSIS — I2510 Atherosclerotic heart disease of native coronary artery without angina pectoris: Secondary | ICD-10-CM | POA: Diagnosis present

## 2020-09-06 DIAGNOSIS — K831 Obstruction of bile duct: Secondary | ICD-10-CM | POA: Diagnosis present

## 2020-09-06 DIAGNOSIS — Z90710 Acquired absence of both cervix and uterus: Secondary | ICD-10-CM | POA: Diagnosis not present

## 2020-09-06 DIAGNOSIS — C257 Malignant neoplasm of other parts of pancreas: Secondary | ICD-10-CM

## 2020-09-06 DIAGNOSIS — Z9079 Acquired absence of other genital organ(s): Secondary | ICD-10-CM | POA: Diagnosis not present

## 2020-09-06 DIAGNOSIS — Z955 Presence of coronary angioplasty implant and graft: Secondary | ICD-10-CM | POA: Diagnosis not present

## 2020-09-06 DIAGNOSIS — Z9689 Presence of other specified functional implants: Secondary | ICD-10-CM | POA: Diagnosis present

## 2020-09-06 DIAGNOSIS — I252 Old myocardial infarction: Secondary | ICD-10-CM | POA: Diagnosis not present

## 2020-09-06 HISTORY — PX: BILIARY STENT PLACEMENT: SHX5538

## 2020-09-06 HISTORY — PX: ERCP: SHX5425

## 2020-09-06 LAB — RESPIRATORY PANEL BY RT PCR (FLU A&B, COVID)
Influenza A by PCR: NEGATIVE
Influenza B by PCR: NEGATIVE
SARS Coronavirus 2 by RT PCR: NEGATIVE

## 2020-09-06 LAB — CBC WITH DIFFERENTIAL/PLATELET
Abs Immature Granulocytes: 0.22 10*3/uL — ABNORMAL HIGH (ref 0.00–0.07)
Basophils Absolute: 0 10*3/uL (ref 0.0–0.1)
Basophils Relative: 0 %
Eosinophils Absolute: 0 10*3/uL (ref 0.0–0.5)
Eosinophils Relative: 0 %
HCT: 29.9 % — ABNORMAL LOW (ref 36.0–46.0)
Hemoglobin: 9.6 g/dL — ABNORMAL LOW (ref 12.0–15.0)
Immature Granulocytes: 1 %
Lymphocytes Relative: 2 %
Lymphs Abs: 0.4 10*3/uL — ABNORMAL LOW (ref 0.7–4.0)
MCH: 31.4 pg (ref 26.0–34.0)
MCHC: 32.1 g/dL (ref 30.0–36.0)
MCV: 97.7 fL (ref 80.0–100.0)
Monocytes Absolute: 0.2 10*3/uL (ref 0.1–1.0)
Monocytes Relative: 1 %
Neutro Abs: 23.2 10*3/uL — ABNORMAL HIGH (ref 1.7–7.7)
Neutrophils Relative %: 96 %
Platelets: 183 10*3/uL (ref 150–400)
RBC: 3.06 MIL/uL — ABNORMAL LOW (ref 3.87–5.11)
RDW: 16.5 % — ABNORMAL HIGH (ref 11.5–15.5)
WBC: 24 10*3/uL — ABNORMAL HIGH (ref 4.0–10.5)
nRBC: 0 % (ref 0.0–0.2)

## 2020-09-06 LAB — GLUCOSE, CAPILLARY
Glucose-Capillary: 106 mg/dL — ABNORMAL HIGH (ref 70–99)
Glucose-Capillary: 208 mg/dL — ABNORMAL HIGH (ref 70–99)
Glucose-Capillary: 93 mg/dL (ref 70–99)

## 2020-09-06 LAB — MAGNESIUM: Magnesium: 2.1 mg/dL (ref 1.7–2.4)

## 2020-09-06 LAB — HEPATIC FUNCTION PANEL
ALT: 99 U/L — ABNORMAL HIGH (ref 0–44)
AST: 99 U/L — ABNORMAL HIGH (ref 15–41)
Albumin: 3.2 g/dL — ABNORMAL LOW (ref 3.5–5.0)
Alkaline Phosphatase: 308 U/L — ABNORMAL HIGH (ref 38–126)
Bilirubin, Direct: 3 mg/dL — ABNORMAL HIGH (ref 0.0–0.2)
Indirect Bilirubin: 1.6 mg/dL — ABNORMAL HIGH (ref 0.3–0.9)
Total Bilirubin: 4.6 mg/dL — ABNORMAL HIGH (ref 0.3–1.2)
Total Protein: 6.2 g/dL — ABNORMAL LOW (ref 6.5–8.1)

## 2020-09-06 LAB — BASIC METABOLIC PANEL
Anion gap: 11 (ref 5–15)
BUN: 20 mg/dL (ref 8–23)
CO2: 23 mmol/L (ref 22–32)
Calcium: 8.9 mg/dL (ref 8.9–10.3)
Chloride: 102 mmol/L (ref 98–111)
Creatinine, Ser: 0.54 mg/dL (ref 0.44–1.00)
GFR calc non Af Amer: >60 mL/min (ref >60)
Glucose, Bld: 102 mg/dL — ABNORMAL HIGH (ref 70–99)
Potassium: 3.6 mmol/L (ref 3.5–5.1)
Sodium: 136 mmol/L (ref 135–145)

## 2020-09-06 SURGERY — ERCP, WITH INTERVENTION IF INDICATED
Anesthesia: General

## 2020-09-06 MED ORDER — LIDOCAINE 2% (20 MG/ML) 5 ML SYRINGE
INTRAMUSCULAR | Status: DC | PRN
  Administered 2020-09-06: 60 mg via INTRAVENOUS

## 2020-09-06 MED ORDER — PANTOPRAZOLE SODIUM 40 MG PO TBEC
40.0000 mg | DELAYED_RELEASE_TABLET | Freq: Two times a day (BID) | ORAL | Status: DC
  Administered 2020-09-06 – 2020-09-08 (×5): 40 mg via ORAL
  Filled 2020-09-06 (×5): qty 1

## 2020-09-06 MED ORDER — METOPROLOL TARTRATE 25 MG PO TABS
25.0000 mg | ORAL_TABLET | Freq: Two times a day (BID) | ORAL | Status: DC
  Administered 2020-09-06 – 2020-09-08 (×5): 25 mg via ORAL
  Filled 2020-09-06 (×5): qty 1

## 2020-09-06 MED ORDER — ALPRAZOLAM 0.25 MG PO TABS
0.2500 mg | ORAL_TABLET | Freq: Every day | ORAL | Status: DC
  Administered 2020-09-06 – 2020-09-07 (×2): 0.25 mg via ORAL
  Filled 2020-09-06 (×2): qty 1

## 2020-09-06 MED ORDER — HYDROMORPHONE HCL 1 MG/ML IJ SOLN
0.5000 mg | INTRAMUSCULAR | Status: DC | PRN
  Administered 2020-09-06 – 2020-09-07 (×4): 1 mg via INTRAVENOUS
  Administered 2020-09-07: 0.5 mg via INTRAVENOUS
  Administered 2020-09-07 – 2020-09-08 (×2): 1 mg via INTRAVENOUS
  Filled 2020-09-06 (×8): qty 1

## 2020-09-06 MED ORDER — CLOPIDOGREL BISULFATE 75 MG PO TABS: 75.0000 mg | ORAL_TABLET | Freq: Every day | ORAL | Status: DC

## 2020-09-06 MED ORDER — DIPHENHYDRAMINE HCL 50 MG/ML IJ SOLN
25.0000 mg | Freq: Once | INTRAMUSCULAR | Status: AC
  Administered 2020-09-06: 25 mg via INTRAVENOUS
  Filled 2020-09-06: qty 1

## 2020-09-06 MED ORDER — PNEUMOCOCCAL VAC POLYVALENT 25 MCG/0.5ML IJ INJ
0.5000 mL | INJECTION | INTRAMUSCULAR | Status: AC
  Administered 2020-09-07: 0.5 mL via INTRAMUSCULAR
  Filled 2020-09-06: qty 0.5

## 2020-09-06 MED ORDER — PANTOPRAZOLE SODIUM 40 MG IV SOLR
40.0000 mg | Freq: Once | INTRAVENOUS | Status: AC
  Administered 2020-09-06: 40 mg via INTRAVENOUS
  Filled 2020-09-06: qty 40

## 2020-09-06 MED ORDER — ASPIRIN EC 81 MG PO TBEC
81.0000 mg | DELAYED_RELEASE_TABLET | Freq: Every day | ORAL | Status: DC
  Administered 2020-09-06 – 2020-09-07 (×2): 81 mg via ORAL
  Filled 2020-09-06 (×2): qty 1

## 2020-09-06 MED ORDER — INFLUENZA VAC A&B SA ADJ QUAD 0.5 ML IM PRSY
0.5000 mL | PREFILLED_SYRINGE | INTRAMUSCULAR | Status: AC
  Administered 2020-09-07: 0.5 mL via INTRAMUSCULAR
  Filled 2020-09-06: qty 0.5

## 2020-09-06 MED ORDER — FENTANYL CITRATE (PF) 100 MCG/2ML IJ SOLN
INTRAMUSCULAR | Status: AC
  Filled 2020-09-06: qty 2

## 2020-09-06 MED ORDER — INDOMETHACIN 50 MG RE SUPP
100.0000 mg | Freq: Once | RECTAL | Status: DC
  Filled 2020-09-06: qty 2

## 2020-09-06 MED ORDER — IOHEXOL 300 MG/ML  SOLN
100.0000 mL | Freq: Once | INTRAMUSCULAR | Status: AC | PRN
  Administered 2020-09-06: 100 mL via INTRAVENOUS

## 2020-09-06 MED ORDER — SODIUM CHLORIDE 0.9 % IV SOLN: INTRAVENOUS | Status: DC

## 2020-09-06 MED ORDER — ESCITALOPRAM OXALATE 10 MG PO TABS
10.0000 mg | ORAL_TABLET | Freq: Every day | ORAL | Status: DC
  Administered 2020-09-06 – 2020-09-08 (×3): 10 mg via ORAL
  Filled 2020-09-06 (×3): qty 1

## 2020-09-06 MED ORDER — GLUCAGON HCL RDNA (DIAGNOSTIC) 1 MG IJ SOLR
INTRAMUSCULAR | Status: AC
  Filled 2020-09-06: qty 1

## 2020-09-06 MED ORDER — LACTATED RINGERS IV SOLN: INTRAVENOUS | Status: DC | PRN

## 2020-09-06 MED ORDER — PROPOFOL 10 MG/ML IV BOLUS
INTRAVENOUS | Status: DC | PRN
  Administered 2020-09-06: 150 mg via INTRAVENOUS

## 2020-09-06 MED ORDER — FENTANYL CITRATE (PF) 100 MCG/2ML IJ SOLN
INTRAMUSCULAR | Status: DC | PRN
Start: 2020-09-06 — End: 2020-09-06
  Administered 2020-09-06 (×2): 50 ug via INTRAVENOUS

## 2020-09-06 MED ORDER — POTASSIUM CHLORIDE 10 MEQ/100ML IV SOLN
10.0000 meq | Freq: Once | INTRAVENOUS | Status: AC
  Administered 2020-09-06: 10 meq via INTRAVENOUS
  Filled 2020-09-06: qty 100

## 2020-09-06 MED ORDER — ONDANSETRON HCL 4 MG/2ML IJ SOLN
INTRAMUSCULAR | Status: DC | PRN
  Administered 2020-09-06: 4 mg via INTRAVENOUS

## 2020-09-06 MED ORDER — HYDROMORPHONE HCL 1 MG/ML IJ SOLN
1.0000 mg | Freq: Once | INTRAMUSCULAR | Status: AC | PRN
  Administered 2020-09-06: 1 mg via INTRAVENOUS
  Filled 2020-09-06: qty 1

## 2020-09-06 MED ORDER — SODIUM CHLORIDE 0.9 % IV SOLN
INTRAVENOUS | Status: DC | PRN
  Administered 2020-09-06: 15 mL

## 2020-09-06 MED ORDER — MONTELUKAST SODIUM 10 MG PO TABS
10.0000 mg | ORAL_TABLET | Freq: Every day | ORAL | Status: DC
  Administered 2020-09-06 – 2020-09-07 (×2): 10 mg via ORAL
  Filled 2020-09-06 (×2): qty 1

## 2020-09-06 MED ORDER — PIPERACILLIN-TAZOBACTAM 3.375 G IVPB
3.3750 g | Freq: Three times a day (TID) | INTRAVENOUS | Status: DC
  Administered 2020-09-06 – 2020-09-08 (×6): 3.375 g via INTRAVENOUS
  Filled 2020-09-06 (×9): qty 50

## 2020-09-06 MED ORDER — ROSUVASTATIN CALCIUM 10 MG PO TABS
10.0000 mg | ORAL_TABLET | Freq: Every day | ORAL | Status: DC
  Administered 2020-09-06 – 2020-09-08 (×3): 10 mg via ORAL
  Filled 2020-09-06 (×3): qty 1

## 2020-09-06 MED ORDER — DEXAMETHASONE SODIUM PHOSPHATE 10 MG/ML IJ SOLN
INTRAMUSCULAR | Status: DC | PRN
  Administered 2020-09-06: 8 mg via INTRAVENOUS

## 2020-09-06 MED ORDER — SUGAMMADEX SODIUM 200 MG/2ML IV SOLN
INTRAVENOUS | Status: DC | PRN
  Administered 2020-09-06: 200 mg via INTRAVENOUS

## 2020-09-06 MED ORDER — KCL IN DEXTROSE-NACL 20-5-0.9 MEQ/L-%-% IV SOLN
INTRAVENOUS | Status: AC
  Filled 2020-09-06 (×3): qty 1000

## 2020-09-06 MED ORDER — MORPHINE SULFATE ER 15 MG PO TBCR
15.0000 mg | EXTENDED_RELEASE_TABLET | Freq: Two times a day (BID) | ORAL | Status: DC
  Administered 2020-09-06 – 2020-09-08 (×5): 15 mg via ORAL
  Filled 2020-09-06 (×5): qty 1

## 2020-09-06 MED ORDER — SODIUM CHLORIDE 0.9 % IV BOLUS
500.0000 mL | Freq: Once | INTRAVENOUS | Status: AC
  Administered 2020-09-06: 500 mL via INTRAVENOUS

## 2020-09-06 MED ORDER — SODIUM CHLORIDE (PF) 0.9 % IJ SOLN
INTRAMUSCULAR | Status: AC
  Filled 2020-09-06: qty 50

## 2020-09-06 MED ORDER — PROPOFOL 10 MG/ML IV BOLUS
INTRAVENOUS | Status: AC
  Filled 2020-09-06: qty 20

## 2020-09-06 MED ORDER — ROCURONIUM BROMIDE 10 MG/ML (PF) SYRINGE
PREFILLED_SYRINGE | INTRAVENOUS | Status: DC | PRN
  Administered 2020-09-06: 40 mg via INTRAVENOUS

## 2020-09-06 NOTE — Transfer of Care (Signed)
Immediate Anesthesia Transfer of Care Note  Patient: Vicki [name]  Procedure(s) Performed: ENDOSCOPIC RETROGRADE CHOLANGIOPANCREATOGRAPHY (ERCP) (N/A )  Patient Location: Endoscopy Unit  Anesthesia Type:MAC  Level of Consciousness: drowsy and patient cooperative  Airway & Oxygen Therapy: Patient Spontanous Breathing and Patient connected to face mask oxygen  Post-op Assessment: Report given to RN and Post -op Vital signs reviewed and stable  Post vital signs: Reviewed and stable  Last Vitals:  Vitals Value Taken Time  BP 124/64 09/06/20 1441  Temp 36.6 C 09/06/20 1441  Pulse 73 09/06/20 1443  Resp 15 09/06/20 1443  SpO2 95 % 09/06/20 1443  Vitals shown include unvalidated device data.  Last Pain:  Vitals:   09/06/20 1441  TempSrc: Oral  PainSc:          Complications: No complications documented.

## 2020-09-06 NOTE — Progress Notes (Signed)
Same day note  Patient seen and examined at bedside.  Patient was admitted to the hospital for abdominal pain  At the time of my evaluation, patient complains of mild epigastric pain.  Denies any nausea vomiting.  Physical examination reveals this over the right upper quadrant.  Mild icterus noted.  Laboratory data and imaging was reviewed  Assessment and Plan.  Abdominal pain, history of pancreatic cancer.  CT scan showing obstructed biliary stent.  GI has been consulted.  Off Plavix.  Focus on analgesia IV hydration.  GI to see the patient.    History of pancreatic cancer with elevated LFT.  Oncology following.  CAD status post stent.  No active chest pain.  Continue aspirin.  Plavix on hold at the request of GI.  Continue beta-blockers and statins.    Mild hypokalemia replenished.  Monitor in a.m.  Anemia.  Likely anemia of chronic disease.  History of collagenous colitis   No Charge  Signed,  Delila Pereyra, MD [facility]

## 2020-09-06 NOTE — Anesthesia Preprocedure Evaluation (Signed)
Anesthesia Evaluation  Patient identified by MRN, date of birth, ID band Patient awake    Reviewed: Allergy & Precautions, H&P , NPO status , Patient's Chart, lab work & pertinent test results, reviewed documented beta blocker date and time   History of Anesthesia Complications Negative for: history of anesthetic complications  Airway Mallampati: II  TM Distance: >3 FB Neck ROM: full    Dental  (+) Teeth Intact   Pulmonary neg pulmonary ROS, former smoker,    Pulmonary exam normal        Cardiovascular hypertension, Pt. on medications and Pt. on home beta blockers + CAD, + Past MI (2011) and + Cardiac Stents  Normal cardiovascular exam     Neuro/Psych negative neurological ROS  negative psych ROS   GI/Hepatic Neg liver ROS, Pancreatic mass, jaundice   Endo/Other  negative endocrine ROS  Renal/GU negative Renal ROS  negative genitourinary   Musculoskeletal negative musculoskeletal ROS (+)   Abdominal   Peds  Hematology negative hematology ROS (+)   Anesthesia Other Findings   Reproductive/Obstetrics                             Anesthesia Physical  Anesthesia Plan  ASA: III  Anesthesia Plan: General   Post-op Pain Management:    Induction: Intravenous  PONV Risk Score and Plan: 3 and Ondansetron, Dexamethasone, Midazolam and Treatment may vary due to age or medical condition  Airway Management Planned: Oral ETT  Additional Equipment: None  Intra-op Plan:   Post-operative Plan: Extubation in OR  Informed Consent: I have reviewed the patients History and Physical, chart, labs and discussed the procedure including the risks, benefits and alternatives for the proposed anesthesia with the patient or authorized representative who has indicated his/her understanding and acceptance.     Dental advisory given  Plan Discussed with:   Anesthesia Plan Comments:          Anesthesia Quick Evaluation

## 2020-09-06 NOTE — Anesthesia Procedure Notes (Signed)
Procedure Name: Intubation Date/Time: 09/06/2020 1:53 PM Performed by: Montel Clock, CRNA Pre-anesthesia Checklist: Patient identified, Emergency Drugs available, Suction available, Patient being monitored and Timeout performed Patient Re-evaluated:Patient Re-evaluated prior to induction Oxygen Delivery Method: Circle system utilized Preoxygenation: Pre-oxygenation with 100% oxygen Induction Type: IV induction Ventilation: Mask ventilation without difficulty Laryngoscope Size: Mac and 3 Grade View: Grade I Tube type: Oral Tube size: 7.0 mm Number of attempts: 1 Airway Equipment and Method: Stylet Placement Confirmation: ETT inserted through vocal cords under direct vision,  positive ETCO2 and breath sounds checked- equal and bilateral Secured at: 21 cm Tube secured with: Tape Dental Injury: Teeth and Oropharynx as per pre-operative assessment

## 2020-09-06 NOTE — Progress Notes (Signed)
Initial Nutrition Assessment  DOCUMENTATION CODES:   Not applicable  INTERVENTION:  - diet advancement as medically feasible. - will monitor for need for nutrition support.  - will complete NFPE at follow-up.   NUTRITION DIAGNOSIS:   Increased nutrient needs related to catabolic illness, cancer and cancer related treatments as evidenced by estimated needs.  GOAL:   Patient will meet greater than or equal to 90% of their needs  MONITOR:   Diet advancement, Labs, Weight trends  REASON FOR ASSESSMENT:   Malnutrition Screening Tool  ASSESSMENT:   64 y.o. female with medical history of metastatic pancreatic cancer with last chemo ~2 weeks ago, MI, HLD, HTN, clotting disorder, and CAD. She presented to the ED with worsening abdominal pain and persistent vomiting.  She has been NPO since admission. Patient sleeping soundly at the time of attempted visit; did not want to wake due to plan for ERCP today. A female was in her room when RD arrived to the floor but at the time of attempted visit, he was no longer present.   Weight today is 136 lb, weight on 9/22 was 133 lb, and weight has overall been stable since 7/7.   Per notes: - CT concerning for obstructed biliary stent - pancreatic cancer - hx of anemia - plan for ERCp 10/8    Labs reviewed; Alk Phos elevated, LFTs elevated. Medications reviewed; 200 mg solu-cortef x1 dose 10/7, 40 mg oral protonix BID, 10 mEq IV KCl x1 run 10/8. IVF; D5-NS-20 mEq KCl @ 75 ml/hr (306 kcal).    Diet Order:   Diet Order            Diet NPO time specified  Diet effective now                 EDUCATION NEEDS:   No education needs have been identified at this time  Skin:  Skin Assessment: Reviewed RN Assessment  Last BM:  10/5 (PTA)  Height:   Ht Readings from Last 1 Encounters:  09/06/20 5' 6" (1.676 m)    Weight:   Wt Readings from Last 1 Encounters:  09/06/20 61.7 kg     Estimated Nutritional Needs:  Kcal:   2040-2225 kcal Protein:  105-120 grams Fluid:  >/= 2.2 L/day      , MS, RD, LDN, CNSC Inpatient Clinical Dietitian RD pager # available in AMION  After hours/weekend pager # available in AMION  

## 2020-09-06 NOTE — Consult Note (Signed)
Referring Provider: Dr. Gean Birchwood Primary Care Physician:  Midge Minium, MD Primary Gastroenterologist:  Dr. Penelope Coop [facility])  Reason for Consultation: Suspected biliary stent obstruction  HPI: Vicki [name] is a 64 y.o. female with history of metastatic pancreatic cancer and CBD obstruction (s/p stent placement 03/05/20) and CAD/remote MI presenting with suspected biliary stent obstruction.  Patient reports feeling well until earlier this week when she started having abdominal pain on Monday 10/4.  Then, on Wednesday 10/6, she had worsening abdominal pain, as well as nausea and vomiting.  Abdominal pain is diffuse and severe at the present time.  Denies any hematemesis.  She has also noted intermittent back pain.  Additionally, notes some dark urine that started a couple days ago, and has noted yellowing of her skin denies over the last few days as well.  Recently, she has had some constipation for which she is taking MiraLAX and stool softeners, though she still feels constipated.  Denies any melena or hematochezia.  She takes Plavix and aspirin for CAD and past MI.  Last dose of Plavix was Wednesday 10/6.  Patient is followed by oncology and sees Dr. Benay Spice.  Patient reports 10 rounds of chemotherapy, the last of which occurring 08/07/2020.  She states she is reinitiating chemotherapy next week.  CT 70/2/63: metallic biliary stent remains in good position but there is increased intrahepatic bile duct dilatation and decreased pneumobilia. Some intermediate density contents are present in the mid stent, possible stent obstruction. Pancreas carcinoma which was recently re-staged. The enlarged left periaortic node show some internal cavitation since that prior, an adverse finding.  ERCP 03/05/20: The major papilla appeared normal. The previously placed stent was no longer visible.  A biliary sphincterotomy was performed.  Short distal CBD stricture confirmed with cholangiogram. One  uncovered metal stent was placed into the common bile duct.  Past Medical History:  Diagnosis Date  . CAD (coronary artery disease) 2011   with stent to RCA and 60 % LAD disease  . Clotting disorder ([place])   . Family history of breast cancer   . Family history of cystic fibrosis   . H/O cardiac arrest 02/15/10   with STEMI- Inf wall  . Hyperlipidemia   . Hypertension   . Myocardial infarct ([place]) 02/15/10  . pancreatic ca dx'd 02/2020  . Shock, cardiogenic ([place]) 01/2010   with MI, IABP    Past Surgical History:  Procedure Laterality Date  . ABDOMINAL HYSTERECTOMY  1987   BSO  . APPENDECTOMY  1987  . BILIARY STENT PLACEMENT N/A 03/05/2020   Procedure: BILIARY STENT PLACEMENT;  Surgeon: Clarene Essex, MD;  Location: [facility];  Service: Endoscopy;  Laterality: N/A;  . CORONARY ANGIOPLASTY WITH STENT PLACEMENT  02/15/10   Stent to prox RCA -BMS  . DOPPLER ECHOCARDIOGRAPHY  05/06/2010   EF =50-55%  lvfx low normal ;no sigificant valvular disease seen  . ENDOSCOPIC RETROGRADE CHOLANGIOPANCREATOGRAPHY (ERCP) WITH PROPOFOL N/A 02/29/2020   Procedure: ENDOSCOPIC RETROGRADE CHOLANGIOPANCREATOGRAPHY (ERCP) WITH PROPOFOL;  Surgeon: Arta Silence, MD;  Location: [facility];  Service: Endoscopy;  Laterality: N/A;  . ERCP N/A 03/05/2020   Procedure: ENDOSCOPIC RETROGRADE CHOLANGIOPANCREATOGRAPHY (ERCP);  Surgeon: Clarene Essex, MD;  Location: [facility];  Service: Endoscopy;  Laterality: N/A;  with stent placement  . ESOPHAGOGASTRODUODENOSCOPY (EGD) WITH PROPOFOL N/A 02/29/2020   Procedure: ESOPHAGOGASTRODUODENOSCOPY (EGD) WITH PROPOFOL;  Surgeon: Arta Silence, MD;  Location: [facility];  Service: Endoscopy;  Laterality: N/A;  . EXPLORATORY LAPAROTOMY    . FINE  NEEDLE ASPIRATION  02/29/2020   Procedure: FINE NEEDLE ASPIRATION;  Surgeon: Arta Silence, MD;  Location: [facility];  Service: Endoscopy;;  . HERNIA REPAIR  1980   double bilateral  . IR IMAGING GUIDED PORT INSERTION  03/26/2020  .  MASS EXCISION  09/02/2012   Procedure: EXCISION MASS;  Surgeon: Earnstine Regal, MD;  Location: [facility];  Service: General;  Laterality: Left;  Excise mass Left posterior neck  . NM MYOCAR PERF WALL MOTION  05/06/2010   exercise cap 8 mets. ,mild perfusion defect in basal infer.,mid infer., apical infer., region consistent with infarct/scar. no significant ischemia  . PANCREATIC STENT PLACEMENT  02/29/2020   Procedure: PANCREATIC STENT PLACEMENT;  Surgeon: Arta Silence, MD;  Location: [facility];  Service: Endoscopy;;  . right chest wall exploration    . SPHINCTEROTOMY  02/29/2020   Procedure: SPHINCTEROTOMY;  Surgeon: Arta Silence, MD;  Location: [facility];  Service: Endoscopy;;  . Vicki [name]  03/05/2020   Procedure: Vicki [name];  Surgeon: Clarene Essex, MD;  Location: [facility];  Service: Endoscopy;;  . UPPER ESOPHAGEAL ENDOSCOPIC ULTRASOUND (EUS) N/A 02/29/2020   Procedure: UPPER ESOPHAGEAL ENDOSCOPIC ULTRASOUND (EUS)  LINEAR SCOPE;  Surgeon: Arta Silence, MD;  Location: [facility];  Service: Endoscopy;  Laterality: N/A;  DR. NEEDS TWO HOURS FOR PROCEDURE    Prior to Admission medications   Medication Sig Start Date End Date Taking? Authorizing Provider  ALPRAZolam (XANAX) 0.25 MG tablet Take 1 tablet (0.25 mg total) by mouth at bedtime. 04/09/20  Yes Midge Minium, MD  aspirin 81 MG tablet Take 81 mg by mouth at bedtime.    Yes [provider]  cetirizine (ZYRTEC) 10 MG tablet Take 10 mg by mouth daily.    Yes [provider]  Cholecalciferol (VITAMIN D3) 5000 units CAPS Take 5,000 Units by mouth at bedtime.    Yes [provider]  clopidogrel (PLAVIX) 75 MG tablet Take 75 mg by mouth daily.   Yes [provider]  [facility] Liver Oil 5000-500 UNIT/5ML OIL Take 1 capsule by mouth daily.   Yes [provider]  dexamethasone (DECADRON) 4 MG tablet Take 2 tablets (8 mg total) by mouth 2 (two) times daily. X 3 days after  each chemo. Start on day 2 06/26/20  Yes Ladell Pier, MD  escitalopram (LEXAPRO) 10 MG tablet TAKE 1 TABLET(10 MG) BY MOUTH DAILY Patient taking differently: Take 10 mg by mouth daily.  07/08/20  Yes Midge Minium, MD  metoprolol tartrate (LOPRESSOR) 50 MG tablet TAKE 1 TABLET(50 MG) BY MOUTH TWICE DAILY Patient taking differently: Take 25 mg by mouth 2 (two) times daily.  08/26/20  Yes Lorretta Harp, MD  montelukast (SINGULAIR) 10 MG tablet TAKE 1 TABLET(10 MG) BY MOUTH AT BEDTIME Patient taking differently: Take 10 mg by mouth at bedtime.  06/24/20  Yes Midge Minium, MD  morphine (MS CONTIN) 15 MG 12 hr tablet Take 1 tablet (15 mg total) by mouth every 12 (twelve) hours. 08/21/20  Yes Ladell Pier, MD  nitroGLYCERIN (NITROSTAT) 0.4 MG SL tablet PLACE 1 TABLET UNDER THE TONGUE EVERY 5 MINUTES AS NEEDED FOR CHEST PAIN 11/29/19  Yes Lorretta Harp, MD  ondansetron (ZOFRAN) 8 MG tablet Take 1 tablet (8 mg total) by mouth every 8 (eight) hours as needed for nausea or vomiting. 03/12/20  Yes Ladell Pier, MD  oxyCODONE-acetaminophen (PERCOCET) 10-325 MG tablet Take 1 tablet by mouth every 4 (four) hours as needed for  pain. 08/21/20  Yes Ladell Pier, MD  pantoprazole (PROTONIX) 40 MG tablet Take 1 tablet (40 mg total) by mouth 2 (two) times daily. 11/30/19  Yes Lorretta Harp, MD  polyethylene glycol (MIRALAX / GLYCOLAX) 17 g packet Take 17 g by mouth daily as needed for mild constipation or moderate constipation.    Yes [provider]  potassium chloride SA (KLOR-CON) 20 MEQ tablet TAKE 1 TABLET(20 MEQ) BY MOUTH DAILY Patient taking differently: Take 20 mEq by mouth daily.  03/15/20  Yes Lorretta Harp, MD  rosuvastatin (CRESTOR) 10 MG tablet TAKE 1 TABLET(10 MG) BY MOUTH DAILY Patient taking differently: Take 10 mg by mouth daily.  01/03/20  Yes Lorretta Harp, MD  lisinopril (ZESTRIL) 5 MG tablet Take 1 tablet (5 mg total) by mouth daily. Patient not  taking: Reported on 07/24/2020 11/29/19   Abigail Butts., PA-C  predniSONE (DELTASONE) 50 MG tablet Take #1 tablet 13 hours, 7 hours and 1 hour prior to CT scan. Take Benadryl 50 mg with 1 hour dose Patient not taking: Reported on 08/21/2020 08/07/20   Ladell Pier, MD  prochlorperazine (COMPAZINE) 10 MG tablet Take 1 tablet (10 mg total) by mouth every 6 (six) hours as needed for nausea. Patient not taking: Reported on 07/24/2020 03/25/20   Ladell Pier, MD    Scheduled Meds: . ALPRAZolam  0.25 mg Oral QHS  . aspirin EC  81 mg Oral QHS  . escitalopram  10 mg Oral Daily  . [START ON 09/07/2020] influenza vaccine adjuvanted  0.5 mL Intramuscular Tomorrow-1000  . metoprolol tartrate  25 mg Oral BID  . montelukast  10 mg Oral QHS  . morphine  15 mg Oral Q12H  . pantoprazole  40 mg Oral BID  . [START ON 09/07/2020] pneumococcal 23 valent vaccine  0.5 mL Intramuscular Tomorrow-1000  . rosuvastatin  10 mg Oral Daily  . sodium chloride (PF)       Continuous Infusions: . dextrose 5 % and 0.9 % NaCl with KCl 20 mEq/L 75 mL/hr at 09/06/20 0744  . piperacillin-tazobactam (ZOSYN)  IV    . potassium chloride 10 mEq (09/06/20 0748)   PRN Meds:.HYDROmorphone (DILAUDID) injection  Allergies as of 09/05/2020 - Review Complete 09/05/2020  Allergen Reaction Noted  . Chlorhexidine  04/01/2020  . Contrast media [iodinated diagnostic agents] Hives 06/26/2020    Family History  Problem Relation Age of Onset  . Breast cancer Mother 78  . Dementia Mother   . Cystic fibrosis Father 78       late onset dx  . Healthy Brother   . Dementia Maternal Aunt   . Dementia Maternal Uncle   . Other Paternal Uncle        MVA while in the [employer]  . Arthritis Maternal Grandmother   . AAA (abdominal aortic aneurysm) Maternal Grandfather   . Other Paternal 51        old age  . Heart attack Paternal Grandfather   . Liver disease Paternal Uncle   . Pancreatitis Son        several bouts of  pancreatitis  . Breast cancer Cousin 58       mat first cousin  . Colon cancer Neg Hx     Social History   Socioeconomic History  . Marital status: Married    Spouse name: Not on file  . Number of children: 2  . Years of education: Not on file  . Highest education level: Not  on file  Occupational History  . 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 Smoking status: Former Smoker    Quit date: 01/28/2010    Years since quitting: 10.6  . Smokeless tobacco: Never Used  Vaping Use  . Vaping Use: Never used  Substance and Sexual Activity  . Alcohol use: Yes    Alcohol/week: 1.0 - 2.0 standard drink    Types: 1 - 2 Glasses of wine per week  . Drug use: No  . Sexual activity: Not on file  Other Topics Concern  . Not on file  Social History Narrative  . Not on file   Social Determinants of Health   Financial Resource Strain:   . Difficulty of Paying Living Expenses: Not on file  Food Insecurity:   . Worried About Charity fundraiser in the Last Year: Not on file  . Ran Out of Food in the Last Year: Not on file  Transportation Needs:   . Lack of Transportation (Medical): Not on file  . Lack of Transportation (Non-Medical): Not on file  Physical Activity:   . Days of Exercise per Week: Not on file  . Minutes of Exercise per Session: Not on file  Stress:   . Feeling of Stress : Not on file  Social Connections:   . Frequency of Communication with Friends and Family: Not on file  . Frequency of Social Gatherings with Friends and Family: Not on file  . Attends Religious Services: Not on file  . Active Member of Clubs or Organizations: Not on file  . Attends Archivist Meetings: Not on file  . Marital Status: Not on file  Intimate Partner Violence:   . Fear of Current or Ex-Partner: Not on file  . Emotionally Abused: Not on file  . Physically Abused: Not on file  . Sexually Abused: Not on file    Review of Systems: Review of Systems   Constitutional: Negative for chills and fever.  HENT: Negative for hearing loss and tinnitus.   Eyes: Negative for pain and redness.  Respiratory: Negative for cough and shortness of breath.   Cardiovascular: Negative for chest pain and palpitations.  Gastrointestinal: Positive for abdominal pain, constipation, nausea and vomiting. Negative for blood in stool, diarrhea, heartburn and melena.  Genitourinary: Negative for flank pain and hematuria.  Musculoskeletal: Positive for back pain. Negative for falls.  Skin: Negative for itching and rash.  Neurological: Negative for seizures and loss of consciousness.  Endo/Heme/Allergies: Negative for polydipsia. Does not bruise/bleed easily.  Psychiatric/Behavioral: Negative for substance abuse. The patient is not nervous/anxious.      Physical Exam: Vital signs: Vitals:   09/06/20 0500 09/06/20 0557  BP: 138/64 129/74  Pulse: 63 61  Resp: 15 18  Temp:  98 F (36.7 C)  SpO2: 91% 95%   Last BM Date: 09/03/20 Physical Exam Vitals reviewed.  Constitutional:      General: She is not in acute distress. HENT:     Head: Normocephalic and atraumatic.     Nose: Nose normal.     Mouth/Throat:     Mouth: Mucous membranes are moist.     Pharynx: Oropharynx is clear.  Eyes:     General: Scleral icterus present.     Extraocular Movements: Extraocular movements intact.  Cardiovascular:     Rate and Rhythm: Normal rate and regular rhythm.     Pulses: Normal pulses.     Heart sounds: Normal heart sounds.  Pulmonary:  Effort: Pulmonary effort is normal.     Breath sounds: Normal breath sounds.  Abdominal:     General: Bowel sounds are normal. There is no distension.     Palpations: Abdomen is soft. There is no mass.     Tenderness: There is abdominal tenderness (moderate, diffuse). There is guarding. There is no rebound.     Hernia: No hernia is present.  Musculoskeletal:        General: No swelling or tenderness.     Cervical back:  Normal range of motion and neck supple.  Skin:    General: Skin is warm and dry.  Neurological:     General: No focal deficit present.     Mental Status: She is oriented to person, place, and time. She is lethargic.  Psychiatric:        Mood and Affect: Mood normal.        Behavior: Behavior normal. Behavior is cooperative.      GI:  Lab Results: Recent Labs    09/05/20 2146 09/06/20 0721  WBC 33.4* 24.0*  HGB 10.1* 9.6*  HCT 31.3* 29.9*  PLT 218 183   BMET Recent Labs    09/05/20 2146 09/06/20 0721  NA 135 136  K 3.2* 3.6  CL 100 102  CO2 24 23  GLUCOSE 109* 102*  BUN 20 20  CREATININE 0.57 0.54  CALCIUM 8.9 8.9   LFT Recent Labs    09/06/20 0721  PROT 6.2*  ALBUMIN 3.2*  AST 99*  ALT 99*  ALKPHOS 308*  BILITOT 4.6*  BILIDIR 3.0*  IBILI 1.6*   PT/INR No results for input(s): LABPROT, INR in the last 72 hours.   Studies/Results: CT ABDOMEN PELVIS W CONTRAST  Result Date: 09/06/2020 CLINICAL DATA:  Pancreas cancer.  Nausea with vomiting EXAM: CT ABDOMEN AND PELVIS WITH CONTRAST TECHNIQUE: Multidetector CT imaging of the abdomen and pelvis was performed using the standard protocol following bolus administration of intravenous contrast. CONTRAST:  117mL OMNIPAQUE IOHEXOL 300 MG/ML  SOLN COMPARISON:  08/19/2020 FINDINGS: Lower chest: Lobular septal thickening at the bases. Coronary atherosclerosis. Hepatobiliary: No focal liver abnormality.Bile duct dilatation and decrease in pneumobilia. CBD stent is in stable position with distal gas, proximal fluid, in midportion more intermediate density, see coronal reformats. Pancreas: Pancreas adenocarcinoma with mass at the head/uncinate and main duct dilatation pancreatic body atrophy. Wispy soft tissue density about the pancreas, similar to before. There is a left periaortic lymph node the level of the left renal hilum with interval vague low-density center, similar in size to before at 16 mm. Spleen: Unremarkable.  Adrenals/Urinary Tract: Negative adrenals. No hydronephrosis or stone. Unremarkable bladder. Stomach/Bowel: No obstruction. Appendectomy. No visible bowel inflammation Vascular/Lymphatic: No acute vascular abnormality. Multifocal atheromatous calcification. Reproductive:Hysterectomy. Other: No ascites or pneumoperitoneum. Musculoskeletal: No acute abnormalities. Remote T12 compression fracture. Lower lumbar disc and facet degeneration. Lower thoracic disc degeneration with advanced height loss. IMPRESSION: 1. Metallic biliary stent remains in good position but there is increased intrahepatic bile duct dilatation and decreased pneumobilia. Some intermediate density contents are present in the mid stent, possible stent obstruction. 2. Pancreas carcinoma which was recently re-staged. The enlarged left periaortic node show some internal cavitation since that prior, an adverse finding. Electronically Signed   By: Monte Fantasia M.D.   On: 09/06/2020 04:09    Impression: Suspected biliary stent obstruction: per CT imaging, metallic biliary stent remains in good position but there is increased intrahepatic bile duct dilatation and decreased pneumobilia -T bili 4.6/AST  99/ALT 99/ALP 308.  Patient has had gradually increasing ALP since August, though T bili and AST/ALT were previously within normal limits as of 07/24/2020.  Most recent CMP prior to hospitalization revealed T bili 0.3/AST 33/ALT 73/ALP 266 -WBCs elevated to 24.0, improved from 33.4 yesterday  Metastatic pancreatic cancer, followed by oncology (Dr. Benay Spice) -She underwent 10 rounds of chemotherapy, the last of which occurring 08/07/2020.  She states she is reinitiating chemotherapy next week.  CAD/history of MI: On Plavix and aspirin.  Last dose of Plavix Wednesday, 09/04/2020  Plan: ERCP today to replace or clear existing stent.  I thoroughly discussed the procedure with the patient to include nature, alternatives, benefits, and risks (including  but not limited to pancreatitis, bleeding, infection, perforation, anesthesia/cardiac and pulmonary complications).  Patient verbalized understanding and gave verbal consent to proceed with ERCP.  Continue empiric antibiotic coverage.  Continue to hold Plavix.  Continue to monitor LFTs.  Continue pain control and supportive care.  [facility] will follow.   LOS: 0 days   Salley Slaughter  PA-C 09/06/2020, 8:40 AM  Contact #  [phone]

## 2020-09-06 NOTE — Anesthesia Postprocedure Evaluation (Signed)
Anesthesia Post Note  Patient: Vicki [name]  Procedure(s) Performed: ENDOSCOPIC RETROGRADE CHOLANGIOPANCREATOGRAPHY (ERCP) (N/A ) BILIARY STENT PLACEMENT (N/A )     Patient location during evaluation: PACU Anesthesia Type: General Level of consciousness: awake and alert Pain management: pain level controlled Vital Signs Assessment: post-procedure vital signs reviewed and stable Respiratory status: spontaneous breathing, nonlabored ventilation, respiratory function stable and patient connected to nasal cannula oxygen Cardiovascular status: blood pressure returned to baseline and stable Postop Assessment: no apparent nausea or vomiting Anesthetic complications: no   No complications documented.  Last Vitals:  Vitals:   09/06/20 1451 09/06/20 1512  BP: 126/68 125/82  Pulse: 68 (!) 58  Resp: 16 16  Temp:  36.7 C  SpO2: 94% 92%    Last Pain:  Vitals:   09/06/20 1512  TempSrc: Oral  PainSc:                  [name]

## 2020-09-06 NOTE — Progress Notes (Signed)
I am aware of hospital admission with an obstructed bile duct stent.  She will follow-up as scheduled at the Cancer center 09/12/2020.  Please call oncology as needed.

## 2020-09-06 NOTE — Op Note (Signed)
[facility] Patient Name: Vicki [name] Procedure Date: 09/06/2020 MRN: [id number] Attending MD: Clarene Essex , MD Date of Birth: 1956/03/07 CSN: [id number] Age: 64 Admit Type: Inpatient Procedure:                ERCP Indications:              Biliary dilation on Computed Tomogram Scan,                            Suspected ascending cholangitis, For therapy of                            ascending cholangitis in patient with previous                            metal uncovered stenting and pancreatic cancer Providers:                Clarene Essex, MD, Cleda Daub, RN, Tyrone Apple, Technician, Adair Laundry, CRNA Referring MD:              Medicines:                General Anesthesia Complications:            No immediate complications. Estimated Blood Loss:     Estimated blood loss: none. Procedure:                Pre-Anesthesia Assessment:                           - Prior to the procedure, a History and Physical                            was performed, and patient medications and                            allergies were reviewed. The patient's tolerance of                            previous anesthesia was also reviewed. The risks                            and benefits of the procedure and the sedation                            options and risks were discussed with the patient.                            All questions were answered, and informed consent                            was obtained. Prior Anticoagulants: The patient has                            taken  Plavix (clopidogrel), last dose was 2 days                            prior to procedure. ASA Grade Assessment: III - A                            patient with severe systemic disease. After                            reviewing the risks and benefits, the patient was                            deemed in satisfactory condition to undergo the                            procedure.                            After obtaining informed consent, the scope was                            passed under direct vision. Throughout the                            procedure, the patient's blood pressure, pulse, and                            oxygen saturations were monitored continuously. The                            [id number] ([id number]) Olympus Duodensocope was                            introduced through the mouth, and used to inject                            contrast into and used to cannulate the bile duct.                            The ERCP was accomplished without difficulty. The                            patient tolerated the procedure well. Scope In: Scope Out: Findings:      One metal stent originating in the biliary tree was emerging from the       major papilla. The stent was visibly occluded. The stent was readily       cannulated with the balloon catheter and the wire was advanced into the       intrahepatics but we could only advance the balloon catheter partially       into the stent and the biliary tree was swept with a 9 mm balloon       starting at the lower third of the main duct. Sludge was swept from the       duct. Despite multiple attempts we  could not advance the balloon       catheter any further into the duct and a few more pull-through's were       done and a little dye was injected which showed probable tumor ingrowth       so we placed one 10 Fr by 6 cm metal stent with no external flaps and no       internal flaps was placed 5.5 cm into the common bile duct through the       previously placed stent. Bile flowed through the stent. The stent was in       good position. The introducer and wire were removed there was adequate       biliary drainage and the patient tolerated the procedure well and there       was no pancreatic duct injection or wire advancement throughout the       procedure Impression:               - One visibly occluded stent from  the biliary tree                            was seen in the major papilla.                           - The distal biliary tree was swept and sludge was                            found.                           - One metal stent was placed into the common bile                            duct. Moderate Sedation:      Not Applicable - Patient had care per Anesthesia. Recommendation:           - Clear liquid diet for 4 hours. If doing well at                            6:30 PM may advance to soft solids                           - Continue present medications.                           - Resume Plavix (clopidogrel) at prior dose                            tomorrow if no delayed complications.                           - Return to GI clinic PRN.                           - Telephone GI clinic if symptomatic PRN.                           -  Check liver enzymes (AST, ALT, alkaline                            phosphatase, bilirubin) and hemogram with white                            blood cell count and platelets tomorrow. Procedure Code(s):        --- Professional ---                           [id number], Endoscopic retrograde                            cholangiopancreatography (ERCP); with removal and                            exchange of stent(s), biliary or pancreatic duct,                            including pre- and post-dilation and guide wire                            passage, when performed, including sphincterotomy,                            when performed, each stent exchanged                           [id number], Endoscopic retrograde                            cholangiopancreatography (ERCP); with removal of                            calculi/debris from biliary/pancreatic duct(s) Diagnosis Code(s):        --- Professional ---                           A45.364W, Other mechanical complication of bile                            duct prosthesis, initial encounter                            K83.09, Other cholangitis                           K83.8, Other specified diseases of biliary tract CPT copyright 2019 American Medical Association. All rights reserved. The codes documented in this report are preliminary and upon coder review may  be revised to meet current compliance requirements. Clarene Essex, MD 09/06/2020 2:35:03 PM This report has been signed electronically. Number of Addenda: 0

## 2020-09-06 NOTE — H&P (Signed)
History and Physical    Vicki [name] XAJ:[id number] DOB: [birth year] DOA: 09/05/2020  PCP: Midge Minium, MD   Patient coming from: Home.  Chief Complaint: Abdominal pain.  HPI: Vicki [name] is a 64 y.o. female with history of metastatic pancreatic cancer being followed by Dr. Betsy Coder oncologist last chemo was around 2 weeks ago has history of stent placed by Dr. Paulita Fujita on March 01, 2020 and by Dr. Watt Climes on March 05, 2020 presents to the ER with complaint of worsening abdominal pain.  Patient was in the beach when about 5 days ago patient started having abdominal discomfort.  About 3 days ago the pain worsened with vomiting.  Later in the evening pain subsided.  Yesterday morning patient was able to go to the shopping but later in the evening the pain worsened with pain radiating the back and with persistent vomiting patient decided to come to the ER.  Denies any fever chills chest pain or shortness of breath.  ED Course: In the ER patient's labs are significant for leukocytosis of 33,000 with total bilirubin of 5.6 AST of 131 ALT of 113 hemoglobin 10.1 CT scan showing features concerning for biliary stent occlusion.  Patient admitted for further management.  Covid test is negative.  Hemoglobin is around 10.1 and potassium is around 3.2.  Review of Systems: As per HPI, rest all negative.   Past Medical History:  Diagnosis Date  . CAD (coronary artery disease) 2011   with stent to RCA and 60 % LAD disease  . Clotting disorder [place])   . Family history of breast cancer   . Family history of cystic fibrosis   . H/O cardiac arrest 02/15/10   with STEMI- Inf wall  . Hyperlipidemia   . Hypertension   . Myocardial infarct ([place]) 02/15/10  . pancreatic ca dx'd 02/2020  . Shock, cardiogenic ([place]) 01/2010   with MI, IABP    Past Surgical History:  Procedure Laterality Date  . ABDOMINAL HYSTERECTOMY  1987   BSO  . APPENDECTOMY  1987  . BILIARY STENT PLACEMENT N/A 03/05/2020    Procedure: BILIARY STENT PLACEMENT;  Surgeon: Clarene Essex, MD;  Location: [facility];  Service: Endoscopy;  Laterality: N/A;  . CORONARY ANGIOPLASTY WITH STENT PLACEMENT  02/15/10   Stent to prox RCA -BMS  . DOPPLER ECHOCARDIOGRAPHY  05/06/2010   EF =50-55%  lvfx low normal ;no sigificant valvular disease seen  . ENDOSCOPIC RETROGRADE CHOLANGIOPANCREATOGRAPHY (ERCP) WITH PROPOFOL N/A 02/29/2020   Procedure: ENDOSCOPIC RETROGRADE CHOLANGIOPANCREATOGRAPHY (ERCP) WITH PROPOFOL;  Surgeon: Arta Silence, MD;  Location: [facility];  Service: Endoscopy;  Laterality: N/A;  . ERCP N/A 03/05/2020   Procedure: ENDOSCOPIC RETROGRADE CHOLANGIOPANCREATOGRAPHY (ERCP);  Surgeon: Clarene Essex, MD;  Location: [facility];  Service: Endoscopy;  Laterality: N/A;  with stent placement  . ESOPHAGOGASTRODUODENOSCOPY (EGD) WITH PROPOFOL N/A 02/29/2020   Procedure: ESOPHAGOGASTRODUODENOSCOPY (EGD) WITH PROPOFOL;  Surgeon: Arta Silence, MD;  Location: [facility];  Service: Endoscopy;  Laterality: N/A;  . EXPLORATORY LAPAROTOMY    . FINE NEEDLE ASPIRATION  02/29/2020   Procedure: FINE NEEDLE ASPIRATION;  Surgeon: Arta Silence, MD;  Location: [facility];  Service: Endoscopy;;  . HERNIA REPAIR  1980   double bilateral  . IR IMAGING GUIDED PORT INSERTION  03/26/2020  . MASS EXCISION  09/02/2012   Procedure: EXCISION MASS;  Surgeon: Earnstine Regal, MD;  Location: [facility];  Service: General;  Laterality: Left;  Excise mass Left posterior neck  . NM  MYOCAR PERF WALL MOTION  05/06/2010   exercise cap 8 mets. ,mild perfusion defect in basal infer.,mid infer., apical infer., region consistent with infarct/scar. no significant ischemia  . PANCREATIC STENT PLACEMENT  02/29/2020   Procedure: PANCREATIC STENT PLACEMENT;  Surgeon: Arta Silence, MD;  Location: [facility];  Service: Endoscopy;;  . right chest wall exploration    . SPHINCTEROTOMY  02/29/2020   Procedure: SPHINCTEROTOMY;  Surgeon: Arta Silence, MD;  Location: [facility];  Service: Endoscopy;;  . [name]  03/05/2020   Procedure: [name];  Surgeon: Clarene Essex, MD;  Location: [facility];  Service: Endoscopy;;  . UPPER ESOPHAGEAL ENDOSCOPIC ULTRASOUND (EUS) N/A 02/29/2020   Procedure: UPPER ESOPHAGEAL ENDOSCOPIC ULTRASOUND (EUS)  LINEAR SCOPE;  Surgeon: Arta Silence, MD;  Location: [facility];  Service: Endoscopy;  Laterality: N/A;  DR. NEEDS TWO HOURS FOR PROCEDURE     reports that she quit smoking about 10 years ago. She has never used smokeless tobacco. She reports current alcohol use of about 1.0 - 2.0 standard drink of alcohol per week. She reports that she does not use drugs.  Allergies  Allergen Reactions  . Chlorhexidine     Do not add, port not accessed  . Contrast Media [Iodinated Diagnostic Agents] Hives    CT IV contrast- pt states she still has itching even with 4 hour and 13 hour preps 09/06/20    Family History  Problem Relation Age of Onset  . Breast cancer Mother 104  . Dementia Mother   . Cystic fibrosis Father 17       late onset dx  . Healthy Brother   . Dementia Maternal Aunt   . Dementia Maternal Uncle   . Other Paternal Uncle        MVA while in the [employer]  . Arthritis Maternal Grandmother   . AAA (abdominal aortic aneurysm) Maternal Grandfather   . Other Paternal 68        old age  . Heart attack Paternal Grandfather   . Liver disease Paternal Uncle   . Pancreatitis Son        several bouts of pancreatitis  . Breast cancer Cousin 75       mat first cousin  . Colon cancer Neg Hx     Prior to Admission medications   Medication Sig Start Date End Date Taking? Authorizing Provider  ALPRAZolam (XANAX) 0.25 MG tablet Take 1 tablet (0.25 mg total) by mouth at bedtime. 04/09/20  Yes Midge Minium, MD  aspirin 81 MG tablet Take 81 mg by mouth at bedtime.    Yes [provider]  cetirizine (ZYRTEC) 10 MG tablet Take 10 mg by mouth daily.    Yes  [provider]  Cholecalciferol (VITAMIN D3) 5000 units CAPS Take 5,000 Units by mouth at bedtime.    Yes [provider]  clopidogrel (PLAVIX) 75 MG tablet Take 75 mg by mouth daily.   Yes [provider]  [facility] Liver Oil 5000-500 UNIT/5ML OIL Take 1 capsule by mouth daily.   Yes [provider]  dexamethasone (DECADRON) 4 MG tablet Take 2 tablets (8 mg total) by mouth 2 (two) times daily. X 3 days after each chemo. Start on day 2 06/26/20  Yes Ladell Pier, MD  escitalopram (LEXAPRO) 10 MG tablet TAKE 1 TABLET(10 MG) BY MOUTH DAILY Patient taking differently: Take 10 mg by mouth daily.  07/08/20  Yes Midge Minium, MD  metoprolol tartrate (LOPRESSOR) 50 MG tablet TAKE 1 TABLET(50  MG) BY MOUTH TWICE DAILY Patient taking differently: Take 25 mg by mouth 2 (two) times daily.  08/26/20  Yes Lorretta Harp, MD  montelukast (SINGULAIR) 10 MG tablet TAKE 1 TABLET(10 MG) BY MOUTH AT BEDTIME Patient taking differently: Take 10 mg by mouth at bedtime.  06/24/20  Yes Midge Minium, MD  morphine (MS CONTIN) 15 MG 12 hr tablet Take 1 tablet (15 mg total) by mouth every 12 (twelve) hours. 08/21/20  Yes Ladell Pier, MD  nitroGLYCERIN (NITROSTAT) 0.4 MG SL tablet PLACE 1 TABLET UNDER THE TONGUE EVERY 5 MINUTES AS NEEDED FOR CHEST PAIN 11/29/19  Yes Lorretta Harp, MD  ondansetron (ZOFRAN) 8 MG tablet Take 1 tablet (8 mg total) by mouth every 8 (eight) hours as needed for nausea or vomiting. 03/12/20  Yes Ladell Pier, MD  oxyCODONE-acetaminophen (PERCOCET) 10-325 MG tablet Take 1 tablet by mouth every 4 (four) hours as needed for pain. 08/21/20  Yes Ladell Pier, MD  pantoprazole (PROTONIX) 40 MG tablet Take 1 tablet (40 mg total) by mouth 2 (two) times daily. 11/30/19  Yes Lorretta Harp, MD  polyethylene glycol (MIRALAX / GLYCOLAX) 17 g packet Take 17 g by mouth daily as needed for mild constipation or moderate constipation.    Yes [provider]  potassium chloride SA (KLOR-CON) 20 MEQ tablet TAKE 1 TABLET(20 MEQ) BY MOUTH DAILY Patient taking differently: Take 20 mEq by mouth daily.  03/15/20  Yes Lorretta Harp, MD  rosuvastatin (CRESTOR) 10 MG tablet TAKE 1 TABLET(10 MG) BY MOUTH DAILY Patient taking differently: Take 10 mg by mouth daily.  01/03/20  Yes Lorretta Harp, MD  lisinopril (ZESTRIL) 5 MG tablet Take 1 tablet (5 mg total) by mouth daily. Patient not taking: Reported on 07/24/2020 11/29/19   Abigail Butts., PA-C  predniSONE (DELTASONE) 50 MG tablet Take #1 tablet 13 hours, 7 hours and 1 hour prior to CT scan. Take Benadryl 50 mg with 1 hour dose Patient not taking: Reported on 08/21/2020 08/07/20   Ladell Pier, MD  prochlorperazine (COMPAZINE) 10 MG tablet Take 1 tablet (10 mg total) by mouth every 6 (six) hours as needed for nausea. Patient not taking: Reported on 07/24/2020 03/25/20   Ladell Pier, MD    Physical Exam: Constitutional: Moderately built and nourished. Vitals:   09/06/20 0300 09/06/20 0400 09/06/20 0500 09/06/20 0557  BP: 126/72 129/71 138/64 129/74  Pulse: 62 65 63 61  Resp: 14 15 15 18   Temp:    98 F (36.7 C)  TempSrc:    Oral  SpO2: 93% 93% 91% 95%   Eyes: Anicteric no pallor. ENMT: No discharge from the ears eyes nose or mouth. Neck: No mass felt.  No neck rigidity. Respiratory: No rhonchi or crepitations. Cardiovascular: S1-S2 heard. Abdomen: Soft nontender bowel sounds present. Musculoskeletal: No edema. Skin: No rash. Neurologic: Alert awake oriented to time place and person.  Moves all extremities. Psychiatric: Appears normal.  Normal affect.   Labs on Admission: I have personally reviewed following labs and imaging studies  CBC: Recent Labs  Lab 09/05/20 2146 09/05/20 2239  WBC 33.4*  --   NEUTROABS  --  31.1*  HGB 10.1*  --   HCT 31.3*  --   MCV 96.3  --   PLT 218  --    Basic Metabolic Panel: Recent Labs  Lab 09/05/20 2146  NA 135  K  3.2*  CL 100  CO2 24  GLUCOSE 109*  BUN 20  CREATININE 0.57  CALCIUM 8.9   GFR: CrCl cannot be calculated (Unknown ideal weight.). Liver Function Tests: Recent Labs  Lab 09/05/20 2146  AST 131*  ALT 113*  ALKPHOS 335*  BILITOT 5.6*  PROT 6.5  ALBUMIN 3.6   Recent Labs  Lab 09/05/20 2146  LIPASE 20   No results for input(s): AMMONIA in the last 168 hours. Coagulation Profile: No results for input(s): INR, PROTIME in the last 168 hours. Cardiac Enzymes: No results for input(s): CKTOTAL, CKMB, CKMBINDEX, TROPONINI in the last 168 hours. BNP (last 3 results) No results for input(s): PROBNP in the last 8760 hours. HbA1C: No results for input(s): HGBA1C in the last 72 hours. CBG: No results for input(s): GLUCAP in the last 168 hours. Lipid Profile: No results for input(s): CHOL, HDL, LDLCALC, TRIG, CHOLHDL, LDLDIRECT in the last 72 hours. Thyroid Function Tests: No results for input(s): TSH, T4TOTAL, FREET4, T3FREE, THYROIDAB in the last 72 hours. Anemia Panel: No results for input(s): VITAMINB12, FOLATE, FERRITIN, TIBC, IRON, RETICCTPCT in the last 72 hours. Urine analysis:    Component Value Date/Time   COLORURINE AMBER (A) 09/05/2020 2210   APPEARANCEUR HAZY (A) 09/05/2020 2210   LABSPEC 1.028 09/05/2020 2210   PHURINE 5.0 09/05/2020 2210   GLUCOSEU NEGATIVE 09/05/2020 2210   HGBUR NEGATIVE 09/05/2020 2210   BILIRUBINUR MODERATE (A) 09/05/2020 2210   BILIRUBINUR negative 02/12/2015 1145   KETONESUR 20 (A) 09/05/2020 2210   PROTEINUR 30 (A) 09/05/2020 2210   UROBILINOGEN 0.2 02/12/2015 1145   UROBILINOGEN 4.0 (H) 02/20/2010 1212   NITRITE NEGATIVE 09/05/2020 2210   LEUKOCYTESUR NEGATIVE 09/05/2020 2210   Sepsis Labs: @LABRCNTIP (procalcitonin:4,lacticidven:4) ) Recent Results (from the past 240 hour(s))  Respiratory Panel by RT PCR (Flu A&B, Covid) - Nasopharyngeal Swab     Status: None   Collection Time: 09/06/20  4:31 AM   Specimen: Nasopharyngeal Swab    Result Value Ref Range Status   SARS Coronavirus 2 by RT PCR NEGATIVE NEGATIVE Final    Comment: (NOTE) SARS-CoV-2 target nucleic acids are NOT DETECTED.  The SARS-CoV-2 RNA is generally detectable in upper respiratoy specimens during the acute phase of infection. The lowest concentration of SARS-CoV-2 viral copies this assay can detect is 131 copies/mL. A negative result does not preclude SARS-Cov-2 infection and should not be used as the sole basis for treatment or other patient management decisions. A negative result may occur with  improper specimen collection/handling, submission of specimen other than nasopharyngeal swab, presence of viral mutation(s) within the areas targeted by this assay, and inadequate number of viral copies (<131 copies/mL). A negative result must be combined with clinical observations, patient history, and epidemiological information. The expected result is Negative.  Fact Sheet for Patients:  PinkCheek.be  Fact Sheet for Healthcare Providers:  GravelBags.it  This test is no t yet approved or cleared by the [place] FDA and  has been authorized for detection and/or diagnosis of SARS-CoV-2 by FDA under an Emergency Use Authorization (EUA). This EUA will remain  in effect (meaning this test can be used) for the duration of the COVID-19 declaration under Section 564(b)(1) of the Act, 21 U.S.C. section 360bbb-3(b)(1), unless the authorization is terminated or revoked sooner.     Influenza A by PCR NEGATIVE NEGATIVE Final   Influenza B by PCR NEGATIVE NEGATIVE Final    Comment: (NOTE) The Xpert Xpress SARS-CoV-2/FLU/RSV assay is intended as an aid in  the diagnosis of influenza from Nasopharyngeal swab  specimens and  should not be used as a sole basis for treatment. Nasal washings and  aspirates are unacceptable for Xpert Xpress SARS-CoV-2/FLU/RSV  testing.  Fact Sheet for  Patients: PinkCheek.be  Fact Sheet for Healthcare Providers: GravelBags.it  This test is not yet approved or cleared by the [place] FDA and  has been authorized for detection and/or diagnosis of SARS-CoV-2 by  FDA under an Emergency Use Authorization (EUA). This EUA will remain  in effect (meaning this test can be used) for the duration of the  Covid-19 declaration under Section 564(b)(1) of the Act, 21  U.S.C. section 360bbb-3(b)(1), unless the authorization is  terminated or revoked. Performed at [facility], [place] [address] [place], [place] [address]      Radiological Exams on Admission: CT ABDOMEN PELVIS W CONTRAST  Result Date: 09/06/2020 CLINICAL DATA:  Pancreas cancer.  Nausea with vomiting EXAM: CT ABDOMEN AND PELVIS WITH CONTRAST TECHNIQUE: Multidetector CT imaging of the abdomen and pelvis was performed using the standard protocol following bolus administration of intravenous contrast. CONTRAST:  171mL OMNIPAQUE IOHEXOL 300 MG/ML  SOLN COMPARISON:  08/19/2020 FINDINGS: Lower chest: Lobular septal thickening at the bases. Coronary atherosclerosis. Hepatobiliary: No focal liver abnormality.Bile duct dilatation and decrease in pneumobilia. CBD stent is in stable position with distal gas, proximal fluid, in midportion more intermediate density, see coronal reformats. Pancreas: Pancreas adenocarcinoma with mass at the head/uncinate and main duct dilatation pancreatic body atrophy. Wispy soft tissue density about the pancreas, similar to before. There is a left periaortic lymph node the level of the left renal hilum with interval vague low-density center, similar in size to before at 16 mm. Spleen: Unremarkable. Adrenals/Urinary Tract: Negative adrenals. No hydronephrosis or stone. Unremarkable bladder. Stomach/Bowel: No obstruction. Appendectomy. No visible bowel inflammation Vascular/Lymphatic: No acute  vascular abnormality. Multifocal atheromatous calcification. Reproductive:Hysterectomy. Other: No ascites or pneumoperitoneum. Musculoskeletal: No acute abnormalities. Remote T12 compression fracture. Lower lumbar disc and facet degeneration. Lower thoracic disc degeneration with advanced height loss. IMPRESSION: 1. Metallic biliary stent remains in good position but there is increased intrahepatic bile duct dilatation and decreased pneumobilia. Some intermediate density contents are present in the mid stent, possible stent obstruction. 2. Pancreas carcinoma which was recently re-staged. The enlarged left periaortic node show some internal cavitation since that prior, an adverse finding. Electronically Signed   By: Monte Fantasia M.D.   On: 09/06/2020 04:09     Assessment/Plan Principal Problem:   Obstruction of biliary stent Active Problems:   Essential hypertension   Coronary artery disease involving native coronary artery of native heart without angina pectoris   Pancreatic cancer (HCC)   Abdominal pain    1. Abdominal pain with CT scan showing features concerning for obstructed biliary stent for which I discussed with Dr. Cristina Gong on-call gastroenterologist for [facility] will be seeing patient in consult and requested to hold off Plavix and keep patient on antibiotics.  Patient will be on IV fluids and pain really medication. 2. CAD status post stenting presently holding Plavix at the request of gastroenterologist in anticipation of procedure.  Okay to continue aspirin as per the gastroenterologist.  Patient is also on beta-blockers and statins.  Denies any chest pain. 3. Mild hypokalemia replace and recheck.  Check magnesium levels. 4. Elevated LFTs likely from #1.  Follow LFTs. 5. Pancreatic cancer being followed by Dr. Betsy Coder. 6. Anemia hemoglobin appears to be at baseline follow CBC. 7. History of collagenous colitis on Lexapro.  Given that patient  has possible obstruction of the  biliary stent will need close monitoring for any further worsening in inpatient status.   DVT prophylaxis: SCDs.  Avoiding anticoagulation in anticipation of procedure. Code Status: Full code. Family Communication: Discussed with patient. Disposition Plan: Home. Consults called: Gastroenterology. Admission status: Inpatient.   Rise Patience MD [facility] Pager [phone].  If 7PM-7AM, please contact night-coverage [email or web address] Password [facility]  09/06/2020, 6:40 AM

## 2020-09-07 DIAGNOSIS — R110 Nausea: Secondary | ICD-10-CM

## 2020-09-07 LAB — CBC
HCT: 28 % — ABNORMAL LOW (ref 36.0–46.0)
Hemoglobin: 8.9 g/dL — ABNORMAL LOW (ref 12.0–15.0)
MCH: 31 pg (ref 26.0–34.0)
MCHC: 31.8 g/dL (ref 30.0–36.0)
MCV: 97.6 fL (ref 80.0–100.0)
Platelets: 151 10*3/uL (ref 150–400)
RBC: 2.87 MIL/uL — ABNORMAL LOW (ref 3.87–5.11)
RDW: 16.4 % — ABNORMAL HIGH (ref 11.5–15.5)
WBC: 15.1 10*3/uL — ABNORMAL HIGH (ref 4.0–10.5)
nRBC: 0 % (ref 0.0–0.2)

## 2020-09-07 LAB — COMPREHENSIVE METABOLIC PANEL
ALT: 68 U/L — ABNORMAL HIGH (ref 0–44)
AST: 55 U/L — ABNORMAL HIGH (ref 15–41)
Albumin: 2.8 g/dL — ABNORMAL LOW (ref 3.5–5.0)
Alkaline Phosphatase: 261 U/L — ABNORMAL HIGH (ref 38–126)
Anion gap: 8 (ref 5–15)
BUN: 19 mg/dL (ref 8–23)
CO2: 23 mmol/L (ref 22–32)
Calcium: 8.6 mg/dL — ABNORMAL LOW (ref 8.9–10.3)
Chloride: 108 mmol/L (ref 98–111)
Creatinine, Ser: 0.56 mg/dL (ref 0.44–1.00)
GFR, Estimated: >60 mL/min (ref >60)
Glucose, Bld: 171 mg/dL — ABNORMAL HIGH (ref 70–99)
Potassium: 4.2 mmol/L (ref 3.5–5.1)
Sodium: 139 mmol/L (ref 135–145)
Total Bilirubin: 1.7 mg/dL — ABNORMAL HIGH (ref 0.3–1.2)
Total Protein: 5.3 g/dL — ABNORMAL LOW (ref 6.5–8.1)

## 2020-09-07 LAB — GLUCOSE, CAPILLARY
Glucose-Capillary: 122 mg/dL — ABNORMAL HIGH (ref 70–99)
Glucose-Capillary: 111 mg/dL — ABNORMAL HIGH (ref 70–99)

## 2020-09-07 LAB — PHOSPHORUS: Phosphorus: 2.5 mg/dL (ref 2.5–4.6)

## 2020-09-07 LAB — MAGNESIUM: Magnesium: 2.1 mg/dL (ref 1.7–2.4)

## 2020-09-07 MED ORDER — ONDANSETRON HCL 4 MG/2ML IJ SOLN
4.0000 mg | Freq: Once | INTRAMUSCULAR | Status: AC
  Administered 2020-09-07: 4 mg via INTRAVENOUS
  Filled 2020-09-07: qty 2

## 2020-09-07 MED ORDER — POLYETHYLENE GLYCOL 3350 17 G PO PACK
17.0000 g | PACK | Freq: Three times a day (TID) | ORAL | Status: DC
  Administered 2020-09-07 – 2020-09-08 (×2): 17 g via ORAL
  Filled 2020-09-07 (×3): qty 1

## 2020-09-07 MED ORDER — ONDANSETRON HCL 4 MG/2ML IJ SOLN
4.0000 mg | Freq: Four times a day (QID) | INTRAMUSCULAR | Status: DC | PRN
  Administered 2020-09-07 – 2020-09-08 (×2): 4 mg via INTRAVENOUS
  Filled 2020-09-07 (×2): qty 2

## 2020-09-07 NOTE — Progress Notes (Signed)
PROGRESS NOTE  Vicki [name] RSW:[id number] DOB: [birth year] DOA: 09/05/2020 PCP: Midge Minium, MD   LOS: 1 day   Brief narrative: As per HPI,  Vicki [name] a 64 y.o.femalewithhistory of metastatic pancreatic cancer being followed by Dr. Betsy Coder oncologist last chemo was around 2 weeks ago with a history of biliary stent placed by Dr. Paulita Fujita on March 01, 2020 and by Dr. Watt Climes on March 05, 2020 presented to the ER with complaint of worsening abdominal pain for 5 days.In the ER, patient's labs are significant for leukocytosis of 33,000 with total bilirubin of 5.6 AST of 131 ALT of 113 hemoglobin 10.1 CT scan showed features concerning for biliary stent occlusion. Covid test was negative. Hemoglobin is around 10.1 and potassium is around 3.2. Patient was then admitted for further management.   Assessment/Plan:  Principal Problem:   Obstruction of biliary stent Active Problems:   Essential hypertension   Coronary artery disease involving native coronary artery of native heart without angina pectoris   Pancreatic cancer (HCC)   Abdominal pain   Abdominal pain,history of pancreatic cancer. CT scan showed obstructed biliary stent. GI was consulted and underwent biliary stent exchange and ERCP on 09/06/20. Improved LFTs after the procedure. Patient patient however feels nauseated and sick today in the afternoon..  She does have chronic back pain and is on Dilaudid.  Has been given IV Zofran and will continue Zofran overnight.  GI has seen the patient today but the patient is not feeling well and family is not comfortable about her discharge, will observe the patient overnight.  Watch overnight for nausea or worsening pain.  Will check LFTs in a.m. as well.  History of pancreatic cancer with elevated LFT. Oncology following as outpatient.. Continue analgesic regimen.  CAD status post stent.Resume aspirin, plavix, beta-blockers and statins on discharge..   Mild  hypokalemiareplenished.  Check BMP in a.m.  Anemia.Likely anemiaofchronic disease.  History of collagenous colitis. Stable.   Actually constipated due to narcotics.  Will need good bowel regimen.  Continue MiraLAX.  DVT prophylaxis: SCDs Start: 09/06/20 0938    Code Status: Full code  Family Communication: None  Status is: Inpatient  Remains inpatient appropriate because:IV treatments appropriate due to intensity of illness or inability to take PO, Inpatient level of care appropriate due to severity of illness and Persistent nausea abdominal pain.   Dispo: The patient is from: Home              Anticipated d/c is to: Home              Anticipated d/c date is: 1 day              Patient currently is not medically stable to d/c.   Consultants:  GI  Oncology  Procedures: ERCP and biliary stent exchange on 09/06/20  Antibiotics:  . None  Anti-infectives (From admission, onward)   Start     Dose/Rate Route Frequency Ordered Stop   09/06/20 0800  piperacillin-tazobactam (ZOSYN) IVPB 3.375 g        3.375 g 12.5 mL/hr over 240 Minutes Intravenous Every 8 hours 09/06/20 0648         Subjective: Today, patient was seen and examined at bedside.  Initially was planned for discharge but patient had episodes of nausea and abdominal pain.   Objective: Vitals:   09/07/20 0955 09/07/20 1242  BP: (!) 161/85 (!) 159/88  Pulse: (!) 57 (!) 58  Resp: 17 15  Temp: 97.7  F (36.5 C) 98.4 F (36.9 C)  SpO2: 96% 93%    Intake/Output Summary (Last 24 hours) at 09/07/2020 1540 Last data filed at 09/07/2020 1443 Gross per 24 hour  Intake 434.97 ml  Output 600 ml  Net -165.03 ml   Filed Weights   09/06/20 1231  Weight: 61.7 kg   Body mass index is 21.95 kg/m.   Physical Exam: GENERAL: Patient is alert awake and oriented. Not in obvious distress. HENT: No scleral pallor or icterus. Pupils equally reactive to light. Oral mucosa is moist NECK: is supple, no gross  swelling noted. CHEST: Clear to auscultation. No crackles or wheezes.  Diminished breath sounds bilaterally. CVS: S1 and S2 heard, no murmur. Regular rate and rhythm.  ABDOMEN: Soft, non-tender, bowel sounds are present. EXTREMITIES: No edema. CNS: Cranial nerves are intact. No focal motor deficits. SKIN: warm and dry without rashes.  Data Review: I have personally reviewed the following laboratory data and studies,  CBC: Recent Labs  Lab 09/05/20 2146 09/05/20 2239 09/06/20 0721 09/07/20 0341  WBC 33.4*  --  24.0* 15.1*  NEUTROABS  --  31.1* 23.2*  --   HGB 10.1*  --  9.6* 8.9*  HCT 31.3*  --  29.9* 28.0*  MCV 96.3  --  97.7 97.6  PLT 218  --  183 161   Basic Metabolic Panel: Recent Labs  Lab 09/05/20 2146 09/06/20 0721 09/07/20 0341  NA 135 136 139  K 3.2* 3.6 4.2  CL 100 102 108  CO2 24 23 23   GLUCOSE 109* 102* 171*  BUN 20 20 19   CREATININE 0.57 0.54 0.56  CALCIUM 8.9 8.9 8.6*  MG  --  2.1 2.1  PHOS  --   --  2.5   Liver Function Tests: Recent Labs  Lab 09/05/20 2146 09/06/20 0721 09/07/20 0341  AST 131* 99* 55*  ALT 113* 99* 68*  ALKPHOS 335* 308* 261*  BILITOT 5.6* 4.6* 1.7*  PROT 6.5 6.2* 5.3*  ALBUMIN 3.6 3.2* 2.8*   Recent Labs  Lab 09/05/20 2146  LIPASE 20   No results for input(s): AMMONIA in the last 168 hours. Cardiac Enzymes: No results for input(s): CKTOTAL, CKMB, CKMBINDEX, TROPONINI in the last 168 hours. BNP (last 3 results) No results for input(s): BNP in the last 8760 hours.  ProBNP (last 3 results) No results for input(s): PROBNP in the last 8760 hours.  CBG: Recent Labs  Lab 09/06/20 0753 09/06/20 1554 09/06/20 2350 09/07/20 0739  GLUCAP 93 106* 208* 122*   Recent Results (from the past 240 hour(s))  Respiratory Panel by RT PCR (Flu A&B, Covid) - Nasopharyngeal Swab     Status: None   Collection Time: 09/06/20  4:31 AM   Specimen: Nasopharyngeal Swab  Result Value Ref Range Status   SARS Coronavirus 2 by RT PCR  NEGATIVE NEGATIVE Final    Comment: (NOTE) SARS-CoV-2 target nucleic acids are NOT DETECTED.  The SARS-CoV-2 RNA is generally detectable in upper respiratoy specimens during the acute phase of infection. The lowest concentration of SARS-CoV-2 viral copies this assay can detect is 131 copies/mL. A negative result does not preclude SARS-Cov-2 infection and should not be used as the sole basis for treatment or other patient management decisions. A negative result may occur with  improper specimen collection/handling, submission of specimen other than nasopharyngeal swab, presence of viral mutation(s) within the areas targeted by this assay, and inadequate number of viral copies (<131 copies/mL). A negative result must be combined with  clinical observations, patient history, and epidemiological information. The expected result is Negative.  Fact Sheet for Patients:  PinkCheek.be  Fact Sheet for Healthcare Providers:  GravelBags.it  This test is no t yet approved or cleared by the [place] FDA and  has been authorized for detection and/or diagnosis of SARS-CoV-2 by FDA under an Emergency Use Authorization (EUA). This EUA will remain  in effect (meaning this test can be used) for the duration of the COVID-19 declaration under Section 564(b)(1) of the Act, 21 U.S.C. section 360bbb-3(b)(1), unless the authorization is terminated or revoked sooner.     Influenza A by PCR NEGATIVE NEGATIVE Final   Influenza B by PCR NEGATIVE NEGATIVE Final    Comment: (NOTE) The Xpert Xpress SARS-CoV-2/FLU/RSV assay is intended as an aid in  the diagnosis of influenza from Nasopharyngeal swab specimens and  should not be used as a sole basis for treatment. Nasal washings and  aspirates are unacceptable for Xpert Xpress SARS-CoV-2/FLU/RSV  testing.  Fact Sheet for Patients: PinkCheek.be  Fact Sheet for  Healthcare Providers: GravelBags.it  This test is not yet approved or cleared by the [place] FDA and  has been authorized for detection and/or diagnosis of SARS-CoV-2 by  FDA under an Emergency Use Authorization (EUA). This EUA will remain  in effect (meaning this test can be used) for the duration of the  Covid-19 declaration under Section 564(b)(1) of the Act, 21  U.S.C. section 360bbb-3(b)(1), unless the authorization is  terminated or revoked. Performed at [facility], [place] [address] [place], [place] [address]      Studies: CT ABDOMEN PELVIS W CONTRAST  Result Date: 09/06/2020 CLINICAL DATA:  Pancreas cancer.  Nausea with vomiting EXAM: CT ABDOMEN AND PELVIS WITH CONTRAST TECHNIQUE: Multidetector CT imaging of the abdomen and pelvis was performed using the standard protocol following bolus administration of intravenous contrast. CONTRAST:  120mL OMNIPAQUE IOHEXOL 300 MG/ML  SOLN COMPARISON:  08/19/2020 FINDINGS: Lower chest: Lobular septal thickening at the bases. Coronary atherosclerosis. Hepatobiliary: No focal liver abnormality.Bile duct dilatation and decrease in pneumobilia. CBD stent is in stable position with distal gas, proximal fluid, in midportion more intermediate density, see coronal reformats. Pancreas: Pancreas adenocarcinoma with mass at the head/uncinate and main duct dilatation pancreatic body atrophy. Wispy soft tissue density about the pancreas, similar to before. There is a left periaortic lymph node the level of the left renal hilum with interval vague low-density center, similar in size to before at 16 mm. Spleen: Unremarkable. Adrenals/Urinary Tract: Negative adrenals. No hydronephrosis or stone. Unremarkable bladder. Stomach/Bowel: No obstruction. Appendectomy. No visible bowel inflammation Vascular/Lymphatic: No acute vascular abnormality. Multifocal atheromatous calcification. Reproductive:Hysterectomy. Other: No  ascites or pneumoperitoneum. Musculoskeletal: No acute abnormalities. Remote T12 compression fracture. Lower lumbar disc and facet degeneration. Lower thoracic disc degeneration with advanced height loss. IMPRESSION: 1. Metallic biliary stent remains in good position but there is increased intrahepatic bile duct dilatation and decreased pneumobilia. Some intermediate density contents are present in the mid stent, possible stent obstruction. 2. Pancreas carcinoma which was recently re-staged. The enlarged left periaortic node show some internal cavitation since that prior, an adverse finding. Electronically Signed   By: Monte Fantasia M.D.   On: 09/06/2020 04:09   DG ERCP  Result Date: 09/06/2020 CLINICAL DATA:  New biliary stent placement. EXAM: ERCP TECHNIQUE: Multiple spot images obtained with the fluoroscopic device and submitted for interpretation post-procedure. COMPARISON:  CT abdomen pelvis-09/06/2020; ERCP-03/05/2020 FLUOROSCOPY TIME:  1 minute, 1 second FINDINGS: A single  spot intraoperative fluoroscopic image of the right upper abdominal quadrant during ERCP is provided for review. Provided image demonstrates placement of an additional biliary stent within the pre-existing internal biliary stent. There is an hourglass configuration involving the mid aspect of the new stent. There is minimal opacification of the central aspect of the intrahepatic biliary tree as well as the cystic duct. There is no definitive opacification of the pancreatic duct. IMPRESSION: ERCP with new internal biliary stent placement as above. These images were submitted for radiologic interpretation only. Please see the procedural report for the amount of contrast and the fluoroscopy time utilized. Electronically Signed   By: Sandi Mariscal M.D.   On: 09/06/2020 15:03      Flora Lipps, MD  [facility] 09/07/2020

## 2020-09-07 NOTE — Discharge Summary (Signed)
Physician Discharge Summary  HAELEE [name] QQP:[id number] DOB: [birth year] DOA: 09/05/2020  PCP: Midge Minium, MD  Admit date: 09/05/2020 Discharge date: 09/08/2020  Admitted From: Home  Discharge disposition: Home    Recommendations for Outpatient Follow-Up:   . Follow up with your primary care provider in one week.  . Check CBC, BMP, LFT, magnesium in the next visit . Follow up with oncology as has been scheduled.   Discharge Diagnosis:   Principal Problem:   Obstruction of biliary stent Active Problems:   Essential hypertension   Coronary artery disease involving native coronary artery of native heart without angina pectoris   Pancreatic cancer ([place])   Abdominal pain  Discharge Condition: Improved.  Diet recommendation: Low sodium, heart healthy.    Wound care: None.  Code status: Full.   History of Present Illness:   Vicki [name] is a 64 y.o. female with history of metastatic pancreatic cancer being followed by Dr. Betsy Coder oncologist last chemo was around 2 weeks ago with a history of biliary stent placed by Dr. Paulita Fujita on March 01, 2020 and by Dr. Watt Climes on March 05, 2020 presented to the ER with complaint of worsening abdominal pain for 5 days.  In the ER patient's labs are significant for leukocytosis of 33,000 with total bilirubin of 5.6 AST of 131 ALT of 113 hemoglobin 10.1 CT scan showing features concerning for biliary stent occlusion.  Covid test was negative.  Hemoglobin was around 10.1 and potassium is around 3.2.  Patient was then admitted for further management.   Hospital Course:   Following conditions were addressed during hospitalization as listed below,  Abdominal pain, history of pancreatic cancer.  CT scan showed obstructed biliary stent.  GI was consulted and underwent biliary stent exchange and ERCP on 09/06/20. Improved LFTs after the procedure. Patient is asymptomatic today.    History of pancreatic cancer with elevated LFT.  Oncology  following as outpatient.   CAD status post stent.  Resume aspirin, plavix, beta-blockers and statins.     Mild hypokalemia replenished   Anemia.  Likely anemia of chronic disease.  History of collagenous colitis . Stable.  Currently on stool softeners and bowel regimen   Disposition.  At this time, patient is stable for disposition home. Spoke with the patient's husband at bedside  Medical Consultants:    GI  Oncology  Procedures:    ERCP and biliary stent exchange on 09/06/20 Subjective:   Today, patient was seen and examined at bediside. Denies nausea, vomiting or pain.  Has felt better overnight  Vitals:   09/07/20 2057 09/08/20 0640  BP: (!) 146/93 130/75  Pulse: (!) 59 (!) 53  Resp: 17 17  Temp: 98 F (36.7 C) 97.6 F (36.4 C)  SpO2: 94% 96%   General: Alert awake, not in obvious distress HENT: pupils equally reacting to light,  No scleral pallor or icterus noted. Oral mucosa is moist.  Chest:  Clear breath sounds.  Diminished breath sounds bilaterally. No crackles or wheezes.  CVS: S1 &S2 heard. No murmur.  Regular rate and rhythm. Abdomen: Soft, nontender, nondistended.  Bowel sounds are heard.   Extremities: No cyanosis, clubbing or edema.  Peripheral pulses are palpable. Psych: Alert, awake and oriented, normal mood CNS:  No cranial nerve deficits.  Power equal in all extremities.   Skin: Warm and dry.  No rashes noted.  The results of significant diagnostics from this hospitalization (including imaging, microbiology, ancillary and laboratory) are listed below for  reference.     Diagnostic Studies:   CT ABDOMEN PELVIS W CONTRAST  Result Date: 09/06/2020 CLINICAL DATA:  Pancreas cancer.  Nausea with vomiting EXAM: CT ABDOMEN AND PELVIS WITH CONTRAST TECHNIQUE: Multidetector CT imaging of the abdomen and pelvis was performed using the standard protocol following bolus administration of intravenous contrast. CONTRAST:  173mL OMNIPAQUE IOHEXOL 300 MG/ML  SOLN  COMPARISON:  08/19/2020 FINDINGS: Lower chest: Lobular septal thickening at the bases. Coronary atherosclerosis. Hepatobiliary: No focal liver abnormality.Bile duct dilatation and decrease in pneumobilia. CBD stent is in stable position with distal gas, proximal fluid, in midportion more intermediate density, see coronal reformats. Pancreas: Pancreas adenocarcinoma with mass at the head/uncinate and main duct dilatation pancreatic body atrophy. Wispy soft tissue density about the pancreas, similar to before. There is a left periaortic lymph node the level of the left renal hilum with interval vague low-density center, similar in size to before at 16 mm. Spleen: Unremarkable. Adrenals/Urinary Tract: Negative adrenals. No hydronephrosis or stone. Unremarkable bladder. Stomach/Bowel: No obstruction. Appendectomy. No visible bowel inflammation Vascular/Lymphatic: No acute vascular abnormality. Multifocal atheromatous calcification. Reproductive:Hysterectomy. Other: No ascites or pneumoperitoneum. Musculoskeletal: No acute abnormalities. Remote T12 compression fracture. Lower lumbar disc and facet degeneration. Lower thoracic disc degeneration with advanced height loss. IMPRESSION: 1. Metallic biliary stent remains in good position but there is increased intrahepatic bile duct dilatation and decreased pneumobilia. Some intermediate density contents are present in the mid stent, possible stent obstruction. 2. Pancreas carcinoma which was recently re-staged. The enlarged left periaortic node show some internal cavitation since that prior, an adverse finding. Electronically Signed   By: Monte Fantasia M.D.   On: 09/06/2020 04:09   DG ERCP  Result Date: 09/06/2020 CLINICAL DATA:  New biliary stent placement. EXAM: ERCP TECHNIQUE: Multiple spot images obtained with the fluoroscopic device and submitted for interpretation post-procedure. COMPARISON:  CT abdomen pelvis-09/06/2020; ERCP-03/05/2020 FLUOROSCOPY TIME:  1  minute, 1 second FINDINGS: A single spot intraoperative fluoroscopic image of the right upper abdominal quadrant during ERCP is provided for review. Provided image demonstrates placement of an additional biliary stent within the pre-existing internal biliary stent. There is an hourglass configuration involving the mid aspect of the new stent. There is minimal opacification of the central aspect of the intrahepatic biliary tree as well as the cystic duct. There is no definitive opacification of the pancreatic duct. IMPRESSION: ERCP with new internal biliary stent placement as above. These images were submitted for radiologic interpretation only. Please see the procedural report for the amount of contrast and the fluoroscopy time utilized. Electronically Signed   By: Sandi Mariscal M.D.   On: 09/06/2020 15:03     Labs:   Basic Metabolic Panel: Recent Labs  Lab 09/05/20 2146 09/05/20 2146 09/06/20 0721 09/07/20 0341  NA 135  --  136 139  K 3.2*   < > 3.6 4.2  CL 100  --  102 108  CO2 24  --  23 23  GLUCOSE 109*  --  102* 171*  BUN 20  --  20 19  CREATININE 0.57  --  0.54 0.56  CALCIUM 8.9  --  8.9 8.6*  MG  --   --  2.1 2.1  PHOS  --   --   --  2.5   < > = values in this interval not displayed.   GFR Estimated Creatinine Clearance: 64.8 mL/min (by C-G formula based on SCr of 0.56 mg/dL). Liver Function Tests: Recent Labs  Lab 09/05/20 2146 09/06/20  3710 09/07/20 0341  AST 131* 99* 55*  ALT 113* 99* 68*  ALKPHOS 335* 308* 261*  BILITOT 5.6* 4.6* 1.7*  PROT 6.5 6.2* 5.3*  ALBUMIN 3.6 3.2* 2.8*   Recent Labs  Lab 09/05/20 2146  LIPASE 20   No results for input(s): AMMONIA in the last 168 hours. Coagulation profile No results for input(s): INR, PROTIME in the last 168 hours.  CBC: Recent Labs  Lab 09/05/20 2146 09/05/20 2239 09/06/20 0721 09/07/20 0341  WBC 33.4*  --  24.0* 15.1*  NEUTROABS  --  31.1* 23.2*  --   HGB 10.1*  --  9.6* 8.9*  HCT 31.3*  --  29.9* 28.0*    MCV 96.3  --  97.7 97.6  PLT 218  --  183 151   Cardiac Enzymes: No results for input(s): CKTOTAL, CKMB, CKMBINDEX, TROPONINI in the last 168 hours. BNP: Invalid input(s): POCBNP CBG: Recent Labs  Lab 09/06/20 0753 09/06/20 1554 09/06/20 2350 09/07/20 0739  GLUCAP 93 106* 208* 122*   D-Dimer No results for input(s): DDIMER in the last 72 hours. Hgb A1c No results for input(s): HGBA1C in the last 72 hours. Lipid Profile No results for input(s): CHOL, HDL, LDLCALC, TRIG, CHOLHDL, LDLDIRECT in the last 72 hours. Thyroid function studies No results for input(s): TSH, T4TOTAL, T3FREE, THYROIDAB in the last 72 hours.  Invalid input(s): FREET3 Anemia work up No results for input(s): VITAMINB12, FOLATE, FERRITIN, TIBC, IRON, RETICCTPCT in the last 72 hours. Microbiology Recent Results (from the past 240 hour(s))  Respiratory Panel by RT PCR (Flu A&B, Covid) - Nasopharyngeal Swab     Status: None   Collection Time: 09/06/20  4:31 AM   Specimen: Nasopharyngeal Swab  Result Value Ref Range Status   SARS Coronavirus 2 by RT PCR NEGATIVE NEGATIVE Final    Comment: (NOTE) SARS-CoV-2 target nucleic acids are NOT DETECTED.  The SARS-CoV-2 RNA is generally detectable in upper respiratoy specimens during the acute phase of infection. The lowest concentration of SARS-CoV-2 viral copies this assay can detect is 131 copies/mL. A negative result does not preclude SARS-Cov-2 infection and should not be used as the sole basis for treatment or other patient management decisions. A negative result may occur with  improper specimen collection/handling, submission of specimen other than nasopharyngeal swab, presence of viral mutation(s) within the areas targeted by this assay, and inadequate number of viral copies (<131 copies/mL). A negative result must be combined with clinical observations, patient history, and epidemiological information. The expected result is Negative.  Fact Sheet for  Patients:  PinkCheek.be  Fact Sheet for Healthcare Providers:  GravelBags.it  This test is no t yet approved or cleared by the [place] FDA and  has been authorized for detection and/or diagnosis of SARS-CoV-2 by FDA under an Emergency Use Authorization (EUA). This EUA will remain  in effect (meaning this test can be used) for the duration of the COVID-19 declaration under Section 564(b)(1) of the Act, 21 U.S.C. section 360bbb-3(b)(1), unless the authorization is terminated or revoked sooner.     Influenza A by PCR NEGATIVE NEGATIVE Final   Influenza B by PCR NEGATIVE NEGATIVE Final    Comment: (NOTE) The Xpert Xpress SARS-CoV-2/FLU/RSV assay is intended as an aid in  the diagnosis of influenza from Nasopharyngeal swab specimens and  should not be used as a sole basis for treatment. Nasal washings and  aspirates are unacceptable for Xpert Xpress SARS-CoV-2/FLU/RSV  testing.  Fact Sheet for Patients: PinkCheek.be  Fact Sheet  for Healthcare Providers: GravelBags.it  This test is not yet approved or cleared by the [place] and  has been authorized for detection and/or diagnosis of SARS-CoV-2 by  FDA under an Emergency Use Authorization (EUA). This EUA will remain  in effect (meaning this test can be used) for the duration of the  Covid-19 declaration under Section 564(b)(1) of the Act, 21  U.S.C. section 360bbb-3(b)(1), unless the authorization is  terminated or revoked. Performed at [facility], [place] [address], [place], [place] [address]      Discharge Instructions:   Discharge Instructions     Diet - low sodium heart healthy   Complete by: As directed    Discharge instructions   Complete by: As directed    Follow up with oncology as has been scheduled. Resume home medications.Follow up with your primary care provider for  routine follow up.   Increase activity slowly   Complete by: As directed       Allergies as of 09/07/2020       Reactions   Chlorhexidine    Do not add, port not accessed   Contrast Media [iodinated Diagnostic Agents] Hives   CT IV contrast- pt states she still has itching even with 4 hour and 13 hour preps 09/06/20        Medication List     TAKE these medications    ALPRAZolam 0.25 MG tablet Commonly known as: XANAX Take 1 tablet (0.25 mg total) by mouth at bedtime.   aspirin 81 MG tablet Take 81 mg by mouth at bedtime.   cetirizine 10 MG tablet Commonly known as: ZYRTEC Take 10 mg by mouth daily.   Cod Liver Oil 5000-500 UNIT/5ML Oil Take 1 capsule by mouth daily.   dexamethasone 4 MG tablet Commonly known as: DECADRON Take 2 tablets (8 mg total) by mouth 2 (two) times daily. X 3 days after each chemo. Start on day 2   escitalopram 10 MG tablet Commonly known as: LEXAPRO TAKE 1 TABLET(10 MG) BY MOUTH DAILY What changed: See the new instructions.   lisinopril 5 MG tablet Commonly known as: ZESTRIL Take 1 tablet (5 mg total) by mouth daily.   metoprolol tartrate 50 MG tablet Commonly known as: LOPRESSOR TAKE 1 TABLET(50 MG) BY MOUTH TWICE DAILY What changed: See the new instructions.   montelukast 10 MG tablet Commonly known as: SINGULAIR TAKE 1 TABLET(10 MG) BY MOUTH AT BEDTIME What changed: See the new instructions.   morphine 15 MG 12 hr tablet Commonly known as: MS CONTIN Take 1 tablet (15 mg total) by mouth every 12 (twelve) hours.   nitroGLYCERIN 0.4 MG SL tablet Commonly known as: NITROSTAT PLACE 1 TABLET UNDER THE TONGUE EVERY 5 MINUTES AS NEEDED FOR CHEST PAIN   ondansetron 8 MG tablet Commonly known as: ZOFRAN Take 1 tablet (8 mg total) by mouth every 8 (eight) hours as needed for nausea or vomiting.   oxyCODONE-acetaminophen 10-325 MG tablet Commonly known as: Percocet Take 1 tablet by mouth every 4 (four) hours as needed for pain.     pantoprazole 40 MG tablet Commonly known as: PROTONIX Take 1 tablet (40 mg total) by mouth 2 (two) times daily.   Plavix 75 MG tablet Generic drug: clopidogrel Take 75 mg by mouth daily.   polyethylene glycol 17 g packet Commonly known as: MIRALAX / GLYCOLAX Take 17 g by mouth daily as needed for mild constipation or moderate constipation.   potassium chloride SA 20 MEQ tablet  Commonly known as: KLOR-CON TAKE 1 TABLET(20 MEQ) BY MOUTH DAILY What changed: See the new instructions.   predniSONE 50 MG tablet Commonly known as: DELTASONE Take #1 tablet 13 hours, 7 hours and 1 hour prior to CT scan. Take Benadryl 50 mg with 1 hour dose   prochlorperazine 10 MG tablet Commonly known as: COMPAZINE Take 1 tablet (10 mg total) by mouth every 6 (six) hours as needed for nausea.   rosuvastatin 10 MG tablet Commonly known as: CRESTOR TAKE 1 TABLET(10 MG) BY MOUTH DAILY What changed: See the new instructions.   Vitamin D3 125 MCG (5000 UT) Caps Take 5,000 Units by mouth at bedtime.        Follow-up Information     Midge Minium, MD Follow up.   Specialty: Family Medicine Contact information: [address] [place] [address] [phone]         Lorretta Harp, MD .   Specialties: Cardiology, Radiology Contact information: [address] [place] [address] [phone]                  Time coordinating discharge: 39 minutes  Signed:  Taneya Conkel  [facility] 09/07/2020, 10:43 AM

## 2020-09-07 NOTE — Progress Notes (Signed)
Subjective: Patient states she has not had a bowel movement since Tuesday and complains of back pain but denies abdominal pain.  She was given eggs and bacon for breakfast and would want to eat a baked potato and wants her diet to be changed to regular.  She states she is ready to go home.  Objective: Vital signs in last 24 hours: Temp:  [97.7 F (36.5 C)-98.8 F (37.1 C)] 97.7 F (36.5 C) (10/09 0955) Pulse Rate:  [56-75] 57 (10/09 0955) Resp:  [16-19] 17 (10/09 0955) BP: (110-161)/(63-93) 161/85 (10/09 0955) SpO2:  [92 %-96 %] 96 % (10/09 0955) Weight:  [61.7 kg] 61.7 kg (10/08 1231) Weight change:  Last BM Date: 09/03/20  PE: No icterus, mild pallor GENERAL: Not in distress, able to speak in full sentences ABDOMEN: Soft, nondistended, nontender, normoactive bowel sounds EXTREMITIES: No deformity, no edema  Lab Results: Results for orders placed or performed during the hospital encounter of 09/05/20 (from the past 48 hour(s))  Lipase, blood     Status: None   Collection Time: 09/05/20  9:46 PM  Result Value Ref Range   Lipase 20 11 - 51 U/L    Comment: Performed at [facility], [place] [address] [place], [place] [address]  Comprehensive metabolic panel     Status: Abnormal   Collection Time: 09/05/20  9:46 PM  Result Value Ref Range   Sodium 135 135 - 145 mmol/L   Potassium 3.2 (L) 3.5 - 5.1 mmol/L   Chloride 100 98 - 111 mmol/L   CO2 24 22 - 32 mmol/L   Glucose, Bld 109 (H) 70 - 99 mg/dL    Comment: Glucose reference range applies only to samples taken after fasting for at least 8 hours.   BUN 20 8 - 23 mg/dL   Creatinine, Ser 0.57 0.44 - 1.00 mg/dL   Calcium 8.9 8.9 - 10.3 mg/dL   Total Protein 6.5 6.5 - 8.1 g/dL   Albumin 3.6 3.5 - 5.0 g/dL   AST 131 (H) 15 - 41 U/L   ALT 113 (H) 0 - 44 U/L   Alkaline Phosphatase 335 (H) 38 - 126 U/L   Total Bilirubin 5.6 (H) 0.3 - 1.2 mg/dL   GFR calc non Af Amer >60 >60 mL/min   Anion gap 11 5 - 15    Comment:  Performed at [facility], [place] [address], [place], [place] [address]  CBC     Status: Abnormal   Collection Time: 09/05/20  9:46 PM  Result Value Ref Range   WBC 33.4 (H) 4.0 - 10.5 K/uL   RBC 3.25 (L) 3.87 - 5.11 MIL/uL   Hemoglobin 10.1 (L) 12.0 - 15.0 g/dL   HCT 31.3 (L) 36 - 46 %   MCV 96.3 80.0 - 100.0 fL   MCH 31.1 26.0 - 34.0 pg   MCHC 32.3 30.0 - 36.0 g/dL   RDW 16.4 (H) 11.5 - 15.5 %   Platelets 218 150 - 400 K/uL   nRBC 0.0 0.0 - 0.2 %    Comment: Performed at [facility], [place] [address], [place], [place] [address]  Urinalysis, Routine w reflex microscopic     Status: Abnormal   Collection Time: 09/05/20 10:10 PM  Result Value Ref Range   Color, Urine AMBER (A) YELLOW    Comment: BIOCHEMICALS MAY BE AFFECTED BY COLOR   APPearance HAZY (A) CLEAR   Specific Gravity, Urine 1.028 1.005 - 1.030   pH 5.0  5.0 - 8.0   Glucose, UA NEGATIVE NEGATIVE mg/dL   Hgb urine dipstick NEGATIVE NEGATIVE   Bilirubin Urine MODERATE (A) NEGATIVE   Ketones, ur 20 (A) NEGATIVE mg/dL   Protein, ur 30 (A) NEGATIVE mg/dL   Nitrite NEGATIVE NEGATIVE   Leukocytes,Ua NEGATIVE NEGATIVE   RBC / HPF 0-5 0 - 5 RBC/hpf   WBC, UA 11-20 0 - 5 WBC/hpf   Bacteria, UA NONE SEEN NONE SEEN   Squamous Epithelial / LPF 0-5 0 - 5   Mucus PRESENT    Hyaline Casts, UA PRESENT     Comment: Performed at [facility], [place] [address], [place], [place] [address]  Differential     Status: Abnormal   Collection Time: 09/05/20 10:39 PM  Result Value Ref Range   Neutrophils Relative % 94 %   Neutro Abs 31.1 (H) 1.7 - 7.7 K/uL   Lymphocytes Relative 1 %   Lymphs Abs 0.3 (L) 0.7 - 4.0 K/uL   Monocytes Relative 3 %   Monocytes Absolute 1.0 0.1 - 1.0 K/uL   Eosinophils Relative 0 %   Eosinophils Absolute 0.0 0 - 0 K/uL   Basophils Relative 0 %   Basophils Absolute 0.0 0 - 0 K/uL   Immature Granulocytes 2 %   Abs Immature Granulocytes 0.51 (H) 0.00 - 0.07  K/uL    Comment: Performed at [facility], [place] [address], [place], [place] [address]  Respiratory Panel by RT PCR (Flu A&B, Covid) - Nasopharyngeal Swab     Status: None   Collection Time: 09/06/20  4:31 AM   Specimen: Nasopharyngeal Swab  Result Value Ref Range   SARS Coronavirus 2 by RT PCR NEGATIVE NEGATIVE    Comment: (NOTE) SARS-CoV-2 target nucleic acids are NOT DETECTED.  The SARS-CoV-2 RNA is generally detectable in upper respiratoy specimens during the acute phase of infection. The lowest concentration of SARS-CoV-2 viral copies this assay can detect is 131 copies/mL. A negative result does not preclude SARS-Cov-2 infection and should not be used as the sole basis for treatment or other patient management decisions. A negative result may occur with  improper specimen collection/handling, submission of specimen other than nasopharyngeal swab, presence of viral mutation(s) within the areas targeted by this assay, and inadequate number of viral copies (<131 copies/mL). A negative result must be combined with clinical observations, patient history, and epidemiological information. The expected result is Negative.  Fact Sheet for Patients:  PinkCheek.be  Fact Sheet for Healthcare Providers:  GravelBags.it  This test is no t yet approved or cleared by the [place] FDA and  has been authorized for detection and/or diagnosis of SARS-CoV-2 by FDA under an Emergency Use Authorization (EUA). This EUA will remain  in effect (meaning this test can be used) for the duration of the COVID-19 declaration under Section 564(b)(1) of the Act, 21 U.S.C. section 360bbb-3(b)(1), unless the authorization is terminated or revoked sooner.     Influenza A by PCR NEGATIVE NEGATIVE   Influenza B by PCR NEGATIVE NEGATIVE    Comment: (NOTE) The Xpert Xpress SARS-CoV-2/FLU/RSV assay is intended as an aid in  the  diagnosis of influenza from Nasopharyngeal swab specimens and  should not be used as a sole basis for treatment. Nasal washings and  aspirates are unacceptable for Xpert Xpress SARS-CoV-2/FLU/RSV  testing.  Fact Sheet for Patients: PinkCheek.be  Fact Sheet for Healthcare Providers: GravelBags.it  This test is not yet approved or cleared by the [place] FDA  and  has been authorized for detection and/or diagnosis of SARS-CoV-2 by  FDA under an Emergency Use Authorization (EUA). This EUA will remain  in effect (meaning this test can be used) for the duration of the  Covid-19 declaration under Section 564(b)(1) of the Act, 21  U.S.C. section 360bbb-3(b)(1), unless the authorization is  terminated or revoked. Performed at [facility], [place] [address], [place], [place] [address]   Hepatic function panel     Status: Abnormal   Collection Time: 09/06/20  7:21 AM  Result Value Ref Range   Total Protein 6.2 (L) 6.5 - 8.1 g/dL   Albumin 3.2 (L) 3.5 - 5.0 g/dL   AST 99 (H) 15 - 41 U/L   ALT 99 (H) 0 - 44 U/L   Alkaline Phosphatase 308 (H) 38 - 126 U/L   Total Bilirubin 4.6 (H) 0.3 - 1.2 mg/dL   Bilirubin, Direct 3.0 (H) 0.0 - 0.2 mg/dL   Indirect Bilirubin 1.6 (H) 0.3 - 0.9 mg/dL    Comment: Performed at [facility], [place] [address], [place], [place] [address]  Basic metabolic panel     Status: Abnormal   Collection Time: 09/06/20  7:21 AM  Result Value Ref Range   Sodium 136 135 - 145 mmol/L   Potassium 3.6 3.5 - 5.1 mmol/L   Chloride 102 98 - 111 mmol/L   CO2 23 22 - 32 mmol/L   Glucose, Bld 102 (H) 70 - 99 mg/dL    Comment: Glucose reference range applies only to samples taken after fasting for at least 8 hours.   BUN 20 8 - 23 mg/dL   Creatinine, Ser 0.54 0.44 - 1.00 mg/dL   Calcium 8.9 8.9 - 10.3 mg/dL   GFR calc non Af Amer >60 >60 mL/min   Anion gap 11 5 - 15    Comment:  Performed at [facility], [place] [address] [place], [place] [address]  Magnesium     Status: None   Collection Time: 09/06/20  7:21 AM  Result Value Ref Range   Magnesium 2.1 1.7 - 2.4 mg/dL    Comment: Performed at [facility], [place] [address], [place], [place] [address]  CBC WITH DIFFERENTIAL     Status: Abnormal   Collection Time: 09/06/20  7:21 AM  Result Value Ref Range   WBC 24.0 (H) 4.0 - 10.5 K/uL   RBC 3.06 (L) 3.87 - 5.11 MIL/uL   Hemoglobin 9.6 (L) 12.0 - 15.0 g/dL   HCT 29.9 (L) 36 - 46 %   MCV 97.7 80.0 - 100.0 fL   MCH 31.4 26.0 - 34.0 pg   MCHC 32.1 30.0 - 36.0 g/dL   RDW 16.5 (H) 11.5 - 15.5 %   Platelets 183 150 - 400 K/uL   nRBC 0.0 0.0 - 0.2 %   Neutrophils Relative % 96 %   Neutro Abs 23.2 (H) 1.7 - 7.7 K/uL   Lymphocytes Relative 2 %   Lymphs Abs 0.4 (L) 0.7 - 4.0 K/uL   Monocytes Relative 1 %   Monocytes Absolute 0.2 0.1 - 1.0 K/uL   Eosinophils Relative 0 %   Eosinophils Absolute 0.0 0 - 0 K/uL   Basophils Relative 0 %   Basophils Absolute 0.0 0 - 0 K/uL   Immature Granulocytes 1 %   Abs Immature Granulocytes 0.22 (H) 0.00 - 0.07 K/uL    Comment: Performed at [facility], [place] [address] [place],  [place] [address]  Glucose, capillary     Status: None   Collection Time: 09/06/20  7:53 AM  Result Value Ref Range   Glucose-Capillary 93 70 - 99 mg/dL    Comment: Glucose reference range applies only to samples taken after fasting for at least 8 hours.  Glucose, capillary     Status: Abnormal   Collection Time: 09/06/20  3:54 PM  Result Value Ref Range   Glucose-Capillary 106 (H) 70 - 99 mg/dL    Comment: Glucose reference range applies only to samples taken after fasting for at least 8 hours.  Glucose, capillary     Status: Abnormal   Collection Time: 09/06/20 11:50 PM  Result Value Ref Range   Glucose-Capillary 208 (H) 70 - 99 mg/dL    Comment: Glucose reference range applies only to samples  taken after fasting for at least 8 hours.  Comprehensive metabolic panel     Status: Abnormal   Collection Time: 09/07/20  3:41 AM  Result Value Ref Range   Sodium 139 135 - 145 mmol/L   Potassium 4.2 3.5 - 5.1 mmol/L   Chloride 108 98 - 111 mmol/L   CO2 23 22 - 32 mmol/L   Glucose, Bld 171 (H) 70 - 99 mg/dL    Comment: Glucose reference range applies only to samples taken after fasting for at least 8 hours.   BUN 19 8 - 23 mg/dL   Creatinine, Ser 0.56 0.44 - 1.00 mg/dL   Calcium 8.6 (L) 8.9 - 10.3 mg/dL   Total Protein 5.3 (L) 6.5 - 8.1 g/dL   Albumin 2.8 (L) 3.5 - 5.0 g/dL   AST 55 (H) 15 - 41 U/L   ALT 68 (H) 0 - 44 U/L   Alkaline Phosphatase 261 (H) 38 - 126 U/L   Total Bilirubin 1.7 (H) 0.3 - 1.2 mg/dL   GFR, Estimated >60 >60 mL/min   Anion gap 8 5 - 15    Comment: Performed at [facility], [place] [address], [place], [place] [address]  Magnesium     Status: None   Collection Time: 09/07/20  3:41 AM  Result Value Ref Range   Magnesium 2.1 1.7 - 2.4 mg/dL    Comment: Performed at [facility], [place] [address], [place], [place] [address]  Phosphorus     Status: None   Collection Time: 09/07/20  3:41 AM  Result Value Ref Range   Phosphorus 2.5 2.5 - 4.6 mg/dL    Comment: Performed at [facility], [place] [address], [place], [place] [address]  CBC     Status: Abnormal   Collection Time: 09/07/20  3:41 AM  Result Value Ref Range   WBC 15.1 (H) 4.0 - 10.5 K/uL   RBC 2.87 (L) 3.87 - 5.11 MIL/uL   Hemoglobin 8.9 (L) 12.0 - 15.0 g/dL   HCT 28.0 (L) 36 - 46 %   MCV 97.6 80.0 - 100.0 fL   MCH 31.0 26.0 - 34.0 pg   MCHC 31.8 30.0 - 36.0 g/dL   RDW 16.4 (H) 11.5 - 15.5 %   Platelets 151 150 - 400 K/uL   nRBC 0.0 0.0 - 0.2 %    Comment: Performed at [facility], [place] [address], [place],  [address]  Glucose, capillary     Status: Abnormal   Collection Time: 09/07/20  7:39 AM  Result Value Ref Range    Glucose-Capillary 122 (H) 70 - 99 mg/dL  Comment: Glucose reference range applies only to samples taken after fasting for at least 8 hours.    Studies/Results: CT ABDOMEN PELVIS W CONTRAST  Result Date: 09/06/2020 CLINICAL DATA:  Pancreas cancer.  Nausea with vomiting EXAM: CT ABDOMEN AND PELVIS WITH CONTRAST TECHNIQUE: Multidetector CT imaging of the abdomen and pelvis was performed using the standard protocol following bolus administration of intravenous contrast. CONTRAST:  136mL OMNIPAQUE IOHEXOL 300 MG/ML  SOLN COMPARISON:  08/19/2020 FINDINGS: Lower chest: Lobular septal thickening at the bases. Coronary atherosclerosis. Hepatobiliary: No focal liver abnormality.Bile duct dilatation and decrease in pneumobilia. CBD stent is in stable position with distal gas, proximal fluid, in midportion more intermediate density, see coronal reformats. Pancreas: Pancreas adenocarcinoma with mass at the head/uncinate and main duct dilatation pancreatic body atrophy. Wispy soft tissue density about the pancreas, similar to before. There is a left periaortic lymph node the level of the left renal hilum with interval vague low-density center, similar in size to before at 16 mm. Spleen: Unremarkable. Adrenals/Urinary Tract: Negative adrenals. No hydronephrosis or stone. Unremarkable bladder. Stomach/Bowel: No obstruction. Appendectomy. No visible bowel inflammation Vascular/Lymphatic: No acute vascular abnormality. Multifocal atheromatous calcification. Reproductive:Hysterectomy. Other: No ascites or pneumoperitoneum. Musculoskeletal: No acute abnormalities. Remote T12 compression fracture. Lower lumbar disc and facet degeneration. Lower thoracic disc degeneration with advanced height loss. IMPRESSION: 1. Metallic biliary stent remains in good position but there is increased intrahepatic bile duct dilatation and decreased pneumobilia. Some intermediate density contents are present in the mid stent, possible stent  obstruction. 2. Pancreas carcinoma which was recently re-staged. The enlarged left periaortic node show some internal cavitation since that prior, an adverse finding. Electronically Signed   By: Monte Fantasia M.D.   On: 09/06/2020 04:09   DG ERCP  Result Date: 09/06/2020 CLINICAL DATA:  New biliary stent placement. EXAM: ERCP TECHNIQUE: Multiple spot images obtained with the fluoroscopic device and submitted for interpretation post-procedure. COMPARISON:  CT abdomen pelvis-09/06/2020; ERCP-03/05/2020 FLUOROSCOPY TIME:  1 minute, 1 second FINDINGS: A single spot intraoperative fluoroscopic image of the right upper abdominal quadrant during ERCP is provided for review. Provided image demonstrates placement of an additional biliary stent within the pre-existing internal biliary stent. There is an hourglass configuration involving the mid aspect of the new stent. There is minimal opacification of the central aspect of the intrahepatic biliary tree as well as the cystic duct. There is no definitive opacification of the pancreatic duct. IMPRESSION: ERCP with new internal biliary stent placement as above. These images were submitted for radiologic interpretation only. Please see the procedural report for the amount of contrast and the fluoroscopy time utilized. Electronically Signed   By: Sandi Mariscal M.D.   On: 09/06/2020 15:03    Medications: I have reviewed the patient's current medications.  Assessment: New biliary stent placed via ERCP yesterday T bili has trended down from 4.6-1.7, AST is 55, ALT 68, ALP is 261  Ongoing constipation, likely narcotic induced-takes MiraLAX and Dulcolax on a regular basis at home  History of metastatic pancreatic cancer, CAD, remote MI  Plan: Okay to start regular diet We will start patient on MiraLAX, okay to take it 1-3 times a day as needed. Okay to resume Plavix. Okay to stop antibiotics. Okay to DC home from GI standpoint, patient has an appointment with  oncology on 09/12/2020.  Ronnette Juniper, MD 09/07/2020, 10:32 AM

## 2020-09-08 DIAGNOSIS — K831 Obstruction of bile duct: Secondary | ICD-10-CM

## 2020-09-08 LAB — COMPREHENSIVE METABOLIC PANEL
ALT: 63 U/L — ABNORMAL HIGH (ref 0–44)
AST: 45 U/L — ABNORMAL HIGH (ref 15–41)
Albumin: 3 g/dL — ABNORMAL LOW (ref 3.5–5.0)
Alkaline Phosphatase: 254 U/L — ABNORMAL HIGH (ref 38–126)
Anion gap: 9 (ref 5–15)
BUN: 17 mg/dL (ref 8–23)
CO2: 24 mmol/L (ref 22–32)
Calcium: 8.6 mg/dL — ABNORMAL LOW (ref 8.9–10.3)
Chloride: 106 mmol/L (ref 98–111)
Creatinine, Ser: 0.59 mg/dL (ref 0.44–1.00)
GFR, Estimated: >60 mL/min (ref >60)
Glucose, Bld: 99 mg/dL (ref 70–99)
Potassium: 3.6 mmol/L (ref 3.5–5.1)
Sodium: 139 mmol/L (ref 135–145)
Total Bilirubin: 1.5 mg/dL — ABNORMAL HIGH (ref 0.3–1.2)
Total Protein: 5.9 g/dL — ABNORMAL LOW (ref 6.5–8.1)

## 2020-09-08 LAB — GLUCOSE, CAPILLARY
Glucose-Capillary: 102 mg/dL — ABNORMAL HIGH (ref 70–99)
Glucose-Capillary: 71 mg/dL (ref 70–99)

## 2020-09-08 MED ORDER — ONDANSETRON HCL 8 MG PO TABS: 8.0000 mg | ORAL_TABLET | Freq: Three times a day (TID) | ORAL | 1 refills | Status: DC | PRN

## 2020-09-08 NOTE — Plan of Care (Signed)
Patient discharging, all care plans resolved. Deanna Artis RN

## 2020-09-08 NOTE — Plan of Care (Signed)
  Problem: Education: Goal: Knowledge of General Education information will improve Description Including pain rating scale, medication(s)/side effects and non-pharmacologic comfort measures Outcome: Progressing   Problem: Clinical Measurements: Goal: Ability to maintain clinical measurements within normal limits will improve Outcome: Progressing   Problem: Clinical Measurements: Goal: Will remain free from infection Outcome: Progressing   

## 2020-09-08 NOTE — Plan of Care (Signed)

## 2020-09-09 ENCOUNTER — Telehealth: Payer: Self-pay

## 2020-09-09 ENCOUNTER — Telehealth: Payer: Self-pay | Admitting: *Deleted

## 2020-09-09 ENCOUNTER — Encounter (HOSPITAL_COMMUNITY): Payer: Self-pay | Admitting: Gastroenterology

## 2020-09-09 NOTE — Telephone Encounter (Signed)
Vicki [name] left a message this morning stating she wanted to talk to Vicki [name] about chemo on Thursday. " I don't feel strong enough to take treatment".  RN left message encouraging her to come in for lab/flush and see [name], NP to discuss. Would be important to have labs checked. No appts cancelled at this time. To call us back if has further questions.

## 2020-09-09 NOTE — Telephone Encounter (Signed)
1st attempt TCM call. No answer. 

## 2020-09-10 ENCOUNTER — Telehealth: Payer: Self-pay

## 2020-09-10 ENCOUNTER — Encounter: Payer: Self-pay | Admitting: Family Medicine

## 2020-09-10 ENCOUNTER — Telehealth (INDEPENDENT_AMBULATORY_CARE_PROVIDER_SITE_OTHER): Payer: Medicare Other | Admitting: Family Medicine

## 2020-09-10 ENCOUNTER — Other Ambulatory Visit: Payer: Self-pay

## 2020-09-10 DIAGNOSIS — J411 Mucopurulent chronic bronchitis: Secondary | ICD-10-CM | POA: Diagnosis not present

## 2020-09-10 MED ORDER — DOXYCYCLINE HYCLATE 100 MG PO TABS: 100.0000 mg | ORAL_TABLET | Freq: Two times a day (BID) | ORAL | 0 refills | Status: DC

## 2020-09-10 NOTE — Progress Notes (Signed)
I have discussed the procedure for the virtual visit with the patient who has given consent to proceed with assessment and treatment.   Pt unable to obtain vitals.   Shironda Kain L Selyna Klahn, CMA     

## 2020-09-10 NOTE — Progress Notes (Signed)
Virtual Visit via Video   I connected with patient on 09/10/20 at  9:30 AM EDT by a video enabled telemedicine application and verified that I am speaking with the correct person using two identifiers.  Location patient: Home Location provider: Acupuncturist, Office Persons participating in the virtual visit: Patient, Provider, [place] (Jess B)  I discussed the limitations of evaluation and management by telemedicine and the availability of in person appointments. The patient expressed understanding and agreed to proceed.  Subjective:   HPI:   Chest congestion- pt was recently hospitalized due to blocked biliary stent.  S/p intubation she developed some chest congestion, 'it was crackling and moving'.  Pt has hx of rapidly progressing lung infxns.  Cough is wet and productive.  No fevers.  No HAs or change in body aches.  No SOB.  Started using ProAir yesterday w/ some improvement.  Recent COVID test was negative.  ROS:   See pertinent positives and negatives per HPI.  Patient Active Problem List   Diagnosis Date Noted  . Abdominal pain 09/06/2020  . Obstruction of biliary stent 09/06/2020  . Genetic testing 04/03/2020  . Chemotherapy induced nausea and vomiting 04/01/2020  . Hypokalemia due to excessive gastrointestinal loss of potassium 04/01/2020  . Pancreatic cancer ([place]) 03/20/2020  . Goals of care, counseling/discussion 03/20/2020  . Family history of breast cancer   . Family history of cystic fibrosis   . Foot pain, right 11/25/2018  . Collagenous colitis 02/08/2018  . Family history of Alzheimer's disease 02/08/2018  . Psoriasis 02/12/2015  . 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 Bradycardia, symptomatic with fatigue 09/13/2013  . Low back pain 02/23/2013  . Coronary artery disease involving native coronary artery of native heart without angina pectoris 05/30/2012  . Physical exam 05/04/2012  . Allergic rhinitis, seasonal 03/25/2012  .  Hyperlipidemia 06/19/2010  . Essential hypertension 06/19/2010  . MYOCARDIAL INFARCTION, HX OF 06/19/2010    Social History   Tobacco Use  . Smoking status: Former Smoker    Quit date: 01/28/2010    Years since quitting: 10.6  . Smokeless tobacco: Never Used  Substance Use Topics  . Alcohol use: Yes    Alcohol/week: 1.0 - 2.0 standard drink    Types: 1 - 2 Glasses of wine per week    Current Outpatient Medications:  .  ALPRAZolam (XANAX) 0.25 MG tablet, Take 1 tablet (0.25 mg total) by mouth at bedtime., Disp: 30 tablet, Rfl: 3 .  aspirin 81 MG tablet, Take 81 mg by mouth at bedtime. , Disp: , Rfl:  .  cetirizine (ZYRTEC) 10 MG tablet, Take 10 mg by mouth daily. , Disp: , Rfl:  .  Cholecalciferol (VITAMIN D3) 5000 units CAPS, Take 5,000 Units by mouth at bedtime. , Disp: , Rfl:  .  clopidogrel (PLAVIX) 75 MG tablet, Take 75 mg by mouth daily., Disp: , Rfl:  .  Cod Liver Oil 5000-500 UNIT/5ML OIL, Take 1 capsule by mouth daily., Disp: , Rfl:  .  escitalopram (LEXAPRO) 10 MG tablet, TAKE 1 TABLET(10 MG) BY MOUTH DAILY (Patient taking differently: Take 10 mg by mouth daily. ), Disp: 90 tablet, Rfl: 0 .  metoprolol tartrate (LOPRESSOR) 50 MG tablet, TAKE 1 TABLET(50 MG) BY MOUTH TWICE DAILY (Patient taking differently: Take 25 mg by mouth 2 (two) times daily. ), Disp: 180 tablet, Rfl: 1 .  montelukast (SINGULAIR) 10 MG tablet, TAKE 1 TABLET(10 MG) BY MOUTH AT BEDTIME (Patient taking differently: Take 10 mg  by mouth at bedtime. ), Disp: 30 tablet, Rfl: 6 .  morphine (MS CONTIN) 15 MG 12 hr tablet, Take 1 tablet (15 mg total) by mouth every 12 (twelve) hours., Disp: 60 tablet, Rfl: 0 .  ondansetron (ZOFRAN) 8 MG tablet, Take 1 tablet (8 mg total) by mouth every 8 (eight) hours as needed for nausea or vomiting., Disp: 60 tablet, Rfl: 1 .  oxyCODONE-acetaminophen (PERCOCET) 10-325 MG tablet, Take 1 tablet by mouth every 4 (four) hours as needed for pain., Disp: 80 tablet, Rfl: 0 .  pantoprazole  (PROTONIX) 40 MG tablet, Take 1 tablet (40 mg total) by mouth 2 (two) times daily., Disp: 180 tablet, Rfl: 3 .  polyethylene glycol (MIRALAX / GLYCOLAX) 17 g packet, Take 17 g by mouth daily as needed for mild constipation or moderate constipation. , Disp: , Rfl:  .  potassium chloride SA (KLOR-CON) 20 MEQ tablet, TAKE 1 TABLET(20 MEQ) BY MOUTH DAILY (Patient taking differently: Take 20 mEq by mouth daily. ), Disp: 90 tablet, Rfl: 3 .  rosuvastatin (CRESTOR) 10 MG tablet, TAKE 1 TABLET(10 MG) BY MOUTH DAILY (Patient taking differently: Take 10 mg by mouth daily. ), Disp: 90 tablet, Rfl: 3 .  dexamethasone (DECADRON) 4 MG tablet, Take 2 tablets (8 mg total) by mouth 2 (two) times daily. X 3 days after each chemo. Start on day 2 (Patient not taking: Reported on 09/10/2020), Disp: 24 tablet, Rfl: 1 .  nitroGLYCERIN (NITROSTAT) 0.4 MG SL tablet, PLACE 1 TABLET UNDER THE TONGUE EVERY 5 MINUTES AS NEEDED FOR CHEST PAIN (Patient not taking: Reported on 09/10/2020), Disp: 25 tablet, Rfl: 1 .  predniSONE (DELTASONE) 50 MG tablet, Take #1 tablet 13 hours, 7 hours and 1 hour prior to CT scan. Take Benadryl 50 mg with 1 hour dose (Patient not taking: Reported on 09/10/2020), Disp: 3 tablet, Rfl: 1  Allergies  Allergen Reactions  . Chlorhexidine     Do not add, port not accessed  . Contrast Media [Iodinated Diagnostic Agents] Hives    CT IV contrast- pt states she still has itching even with 4 hour and 13 hour preps 09/06/20    Objective:   There were no vitals taken for this visit.  AAOx3, NAD NCAT, EOMI No obvious CN deficits Coloring WNL Pt is able to speak clearly, coherently without shortness of breath or increased work of breathing. + wet cough  Thought process is linear.  Mood is appropriate.   Assessment and Plan:   Recurrent bronchitis- pt has hx of recurrent lung infxns and some have been quite severe.  Given her immunocompromised state due to chemo, she is fearful she will not be able to  fight infxn at this point.  Will start Doxy for fairly broad coverage as she was recently intubated/hospitalized.  She will continue her ProAir and I encouraged her to add Mucinex.  She is aware to let me know if things are changing or failing to improve.  Have low threshold to send her to ER if needed based on her current cancer treatment.  Pt expressed understanding and is in agreement w/ plan.   Annye Asa, MD 09/10/2020

## 2020-09-10 NOTE — Telephone Encounter (Signed)
Transition Care Management Follow-up Telephone Call  Date of discharge and from where: 09/08/20-[facility]  How have you been since you were released from the hospital? Coughing & tired  Any questions or concerns? No  Items Reviewed:  Did the pt receive and understand the discharge instructions provided? Yes   Medications obtained and verified? Yes   Any new allergies since your discharge? No   Dietary orders reviewed? Yes  Do you have support at home? Yes   Functional Questionnaire: (I = Independent and D = Dependent) ADLs: I  Bathing/Dressing- I  Meal Prep- I  Eating- I  Maintaining continence- I  Transferring/Ambulation- I  Managing Meds- I  Follow up appointments reviewed:   PCP Hospital f/u appt confirmed? Yes  Scheduled to see Dr. Birdie Riddle on 09/13/20 @ 11:00.  [facility] f/u appt confirmed? N/A  Are transportation arrangements needed? No   If their condition worsens, is the pt aware to call PCP or go to the Emergency Dept.? Yes  Was the patient provided with contact information for the PCP's office or ED? Yes  Was to pt encouraged to call back with questions or concerns? Yes

## 2020-09-12 ENCOUNTER — Inpatient Hospital Stay: Payer: Medicare Other

## 2020-09-12 ENCOUNTER — Ambulatory Visit (HOSPITAL_COMMUNITY)
Admission: RE | Admit: 2020-09-12 | Discharge: 2020-09-12 | Disposition: A | Discharge status: Home / Self Care | Payer: Medicare Other | Source: Ambulatory Visit | Attending: Nurse Practitioner | Admitting: Nurse Practitioner

## 2020-09-12 ENCOUNTER — Encounter: Payer: Self-pay | Admitting: Nurse Practitioner

## 2020-09-12 ENCOUNTER — Inpatient Hospital Stay: Payer: Medicare Other | Attending: Oncology | Admitting: Nurse Practitioner

## 2020-09-12 ENCOUNTER — Other Ambulatory Visit: Payer: Self-pay

## 2020-09-12 VITALS — BP 144/81 | HR 57 | Temp 97.8°F | Resp 16 | Ht 66.0 in | Wt 135.7 lb

## 2020-09-12 DIAGNOSIS — Z95828 Presence of other vascular implants and grafts: Secondary | ICD-10-CM

## 2020-09-12 DIAGNOSIS — C250 Malignant neoplasm of head of pancreas: Secondary | ICD-10-CM | POA: Insufficient documentation

## 2020-09-12 DIAGNOSIS — Z5189 Encounter for other specified aftercare: Secondary | ICD-10-CM | POA: Diagnosis not present

## 2020-09-12 DIAGNOSIS — Z5111 Encounter for antineoplastic chemotherapy: Secondary | ICD-10-CM | POA: Diagnosis present

## 2020-09-12 LAB — CMP (CANCER CENTER ONLY)
ALT: 24 U/L (ref 0–44)
AST: 18 U/L (ref 15–41)
Albumin: 2.9 g/dL — ABNORMAL LOW (ref 3.5–5.0)
Alkaline Phosphatase: 273 U/L — ABNORMAL HIGH (ref 38–126)
Anion gap: 5 (ref 5–15)
BUN: 8 mg/dL (ref 8–23)
CO2: 28 mmol/L (ref 22–32)
Calcium: 8.8 mg/dL — ABNORMAL LOW (ref 8.9–10.3)
Chloride: 106 mmol/L (ref 98–111)
Creatinine: 0.63 mg/dL (ref 0.44–1.00)
GFR, Estimated: >60 mL/min (ref >60)
Glucose, Bld: 95 mg/dL (ref 70–99)
Potassium: 4 mmol/L (ref 3.5–5.1)
Sodium: 139 mmol/L (ref 135–145)
Total Bilirubin: 0.6 mg/dL (ref 0.3–1.2)
Total Protein: 6 g/dL — ABNORMAL LOW (ref 6.5–8.1)

## 2020-09-12 LAB — CBC WITH DIFFERENTIAL (CANCER CENTER ONLY)
Abs Immature Granulocytes: 0.05 10*3/uL (ref 0.00–0.07)
Basophils Absolute: 0 10*3/uL (ref 0.0–0.1)
Basophils Relative: 0 %
Eosinophils Absolute: 0.4 10*3/uL (ref 0.0–0.5)
Eosinophils Relative: 6 %
HCT: 31.9 % — ABNORMAL LOW (ref 36.0–46.0)
Hemoglobin: 10.5 g/dL — ABNORMAL LOW (ref 12.0–15.0)
Immature Granulocytes: 1 %
Lymphocytes Relative: 31 %
Lymphs Abs: 2.1 10*3/uL (ref 0.7–4.0)
MCH: 30.8 pg (ref 26.0–34.0)
MCHC: 32.9 g/dL (ref 30.0–36.0)
MCV: 93.5 fL (ref 80.0–100.0)
Monocytes Absolute: 0.8 10*3/uL (ref 0.1–1.0)
Monocytes Relative: 11 %
Neutro Abs: 3.4 10*3/uL (ref 1.7–7.7)
Neutrophils Relative %: 51 %
Platelet Count: 342 10*3/uL (ref 150–400)
RBC: 3.41 MIL/uL — ABNORMAL LOW (ref 3.87–5.11)
RDW: 16 % — ABNORMAL HIGH (ref 11.5–15.5)
WBC Count: 6.8 10*3/uL (ref 4.0–10.5)
nRBC: 0 % (ref 0.0–0.2)

## 2020-09-12 MED ORDER — SODIUM CHLORIDE 0.9% FLUSH
10.0000 mL | Freq: Once | INTRAVENOUS | Status: AC
  Administered 2020-09-12: 10 mL
  Filled 2020-09-12: qty 10

## 2020-09-12 MED ORDER — MORPHINE SULFATE ER 30 MG PO TBCR: 30.0000 mg | EXTENDED_RELEASE_TABLET | Freq: Two times a day (BID) | ORAL | 0 refills | Status: DC

## 2020-09-12 MED ORDER — HEPARIN SOD (PORK) LOCK FLUSH 100 UNIT/ML IV SOLN
500.0000 [IU] | Freq: Once | INTRAVENOUS | Status: AC
  Administered 2020-09-12: 500 [IU]
  Filled 2020-09-12: qty 5

## 2020-09-12 NOTE — Patient Instructions (Signed)

## 2020-09-12 NOTE — Progress Notes (Addendum)
[facility] OFFICE PROGRESS NOTE   Diagnosis: Pancreas cancer  INTERVAL HISTORY:   Vicki [name] returns as scheduled.  She was hospitalized 09/05/2020 through 09/08/2020 with biliary stent obstruction.  ERCP on 09/06/2020 showed 1 visibly occluded stent from the biliary tree in the major papilla.  Distal biliary tree was swept and sludge was found.  A metal stent was placed into the common bile duct.  She does not feel well.  She is tired.  She is having nausea this morning.  She has continued back pain.  She is currently taking MS Contin 15 mg twice daily and oxycodone every 4 hours.  She wakes up during the night to take pain medication.  She continues to have problems with constipation.  She is taking MiraLAX.  Appetite varies.  She reports good fluid intake.  She recently developed a cough.  No fever.  No shortness of breath.  Continued numbness in the hands and feet.  Objective:  Vital signs in last 24 hours:  Blood pressure (!) 144/81, pulse (!) 57, temperature 97.8 F (36.6 C), temperature source Tympanic, resp. rate 16, height 5\' 6"  (1.676 m), weight 135 lb 11.2 oz (61.6 kg), SpO2 99 %.    HEENT: No thrush or ulcers. Resp: Bilateral rhonchi.  Rales at both lung bases. Cardio: Regular rate and rhythm. GI: Abdomen soft and nontender.  No hepatomegaly.  No apparent ascites. Vascular: No leg edema.  Skin: Warm and dry. Port-A-Cath without erythema.   Lab Results:  Lab Results  Component Value Date   WBC 6.8 09/12/2020   HGB 10.5 (L) 09/12/2020   HCT 31.9 (L) 09/12/2020   MCV 93.5 09/12/2020   PLT 342 09/12/2020   NEUTROABS PENDING 09/12/2020    Imaging:  No results found.  Medications: I have reviewed the patient's current medications.  Assessment/Plan: 1. 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 MRI abdomen 02/01/2020-2.2 x 3.3 cm mass in the posterior pancreas head/uncinate process, mild intrahepatic/extrahepatic  ductal dilatation, no evidence of vascular invasion, possible small lymph nodes in the porta hepatis-poorly visualized ? EUS 02/29/2020-20 x 23 mm mass in the pancreas head, upstream pancreatic duct dilatation, 1 abnormal peripancreatic node, uT3?uN1 ? 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 CTs 03/19/2020-2.9 x 2.1 x 3.1 cm pancreas head/uncinate mass, lesion associated with infrarenal abdominal aorta and superior mesenteric artery, prominent and borderline enlarged retroperitoneal nodes including a left periaortic node, indeterminate 4 mm lung nodule ? Cycle 1 mFOLFIRINOX 2/70/78, complicated by n/v and hospital admission 5/2/ - 5/3 ? Second opinion at [facility] with Dr. Earnestine Mealing and Dr. Mariah Milling 04/02/20 ? PET scan 04/03/2020-hypermetabolic poorly marginated pancreatic head mass; 2 hypermetabolic left periaortic lymph nodes, hypermetabolic left supraclavicular lymph node  ? Cycle 2 FOLFIRINOX 04/11/2020 ? Ultrasound-guided biopsy of a left supraclavicular mass on 04/18/2020-poorly differentiated adenocarcinoma, cytokeratin 7+, cytokeratin 20 and CDX2 positive in rare cells. ? Cycle 3 FOLFIRINOX 04/24/2020-oxaliplatin infusion lengthened to 3 hours, Udenyca was not given (ordered) ? Cycle 4 FOLFIRINOX 05/08/2020 ? Cycle 5 FOLFIRINOX 05/22/2020-atropine and Ativan premedication added secondary to cholinergic symptoms and nausea from irinotecan ? CTs 05/29/2020-decreased left supraclavicular lymph node, slight decrease in size of the pancreas head mass, decrease in retroperitoneal lymphadenopathy ? Cycle 6 FOLFIRINOX 06/05/2020 ? Cycle 7 FOLFIRINOX 06/26/2020 ? Cycle 8 FOLFIRINOX 07/11/2020 (oxaliplatin held secondary to neuropathy) ? Cycle 9 FOLFIRINOX 07/24/2020 (oxaliplatin held secondary to neuropathy) ? Cycle 10 FOLFIRINOX 08/07/2020 (oxaliplatin held secondary to neuropathy) ?  CTs 08/19/2020-subtle 5 mm peripheral right liver lesion, pancreas mass unchanged and difficult to discretely  delineate, unchanged left retroperitoneal node, no evidence of disease progression, on my review of the CTs there is no change in the previously enlarged left supraclavicular node  2. Abdomen/back pain secondary to #1 3. Anorexia/weight loss secondary to #1 4. History of coronary artery disease,STEMI with the fib arrest 2011, status post RCA stent 5. Constipation, secondary to #1 and morphine 6. Hospital admission for n/v after cycle 1 FOLFIRINOX 5/2 - 5/3 --prophylactic dexamethasone beginning day 2, Decadron dose increased beginning with cycle 4 FOLFIRINOX 7.  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  Biliary stent obstruction 09/05/2020.  Metal stent placed into the common bile duct 09/06/2020.  Disposition: Vicki [name] appears stable.  Her performance status is borderline for treatment.  We decided to hold today's treatment and reschedule for 1 week.  For pain she will increase MS Contin to 30 mg every 12 hours, continue oxycodone as needed.  She will work on her bowels.  We are getting a chest x-ray today to evaluate the cough.  We reviewed the CBC and chemistry panel from today.  Bilirubin has normalized.  She will return for lab, follow-up, possible FOLFIRINOX in 1 week.  Patient seen with Dr. Benay Spice.    [name] ANP/GNP-BC   09/12/2020  9:30 AM This was a shared visit with [name].  Vicki [name] was interviewed and examined.  She is recovering from the hospital admission with biliary obstruction.  She has symptoms of a COPD flare.  We will refer her for chest x-ray to be sure she has not developed pneumonia.  Chemotherapy will be held this week.  Julieanne Manson, MD

## 2020-09-13 ENCOUNTER — Encounter: Payer: Self-pay | Admitting: Family Medicine

## 2020-09-13 ENCOUNTER — Inpatient Hospital Stay: Payer: Medicare Other | Admitting: Family Medicine

## 2020-09-13 ENCOUNTER — Other Ambulatory Visit: Payer: Self-pay

## 2020-09-13 ENCOUNTER — Telehealth (INDEPENDENT_AMBULATORY_CARE_PROVIDER_SITE_OTHER): Payer: Medicare Other | Admitting: Family Medicine

## 2020-09-13 DIAGNOSIS — T85590D Other mechanical complication of bile duct prosthesis, subsequent encounter: Secondary | ICD-10-CM

## 2020-09-13 DIAGNOSIS — C257 Malignant neoplasm of other parts of pancreas: Secondary | ICD-10-CM | POA: Diagnosis not present

## 2020-09-13 DIAGNOSIS — R1013 Epigastric pain: Secondary | ICD-10-CM

## 2020-09-13 DIAGNOSIS — J411 Mucopurulent chronic bronchitis: Secondary | ICD-10-CM

## 2020-09-13 LAB — CANCER ANTIGEN 19-9: CA 19-9: 21 U/mL (ref 0–35)

## 2020-09-13 NOTE — Progress Notes (Signed)
I have discussed the procedure for the virtual visit with the patient who has given consent to proceed with assessment and treatment.   Pt unable to obtain vitals today. All were taken yesterday.   Davis Gourd, CMA

## 2020-09-13 NOTE — Progress Notes (Signed)
Virtual Visit via Video   I connected with patient on 09/13/20 at  2:00 PM EDT by a video enabled telemedicine application and verified that I am speaking with the correct person using two identifiers.  Location patient: Home Location provider: [facility], Office Persons participating in the virtual visit: Patient, Provider, [place] (Jess B)  I discussed the limitations of evaluation and management by telemedicine and the availability of in person appointments. The patient expressed understanding and agreed to proceed.  Subjective:   HPI:   Hospital f/u- pt was admitted 10/7-10/10 with obstruction of biliary stent.  She has metastatic pancreatic cancer and follows w/ Dr Benay Spice.  Dr Paulita Fujita originally placed stent on 03/01/20 and then 03/05/20.  She presented w/ worsening abdominal pain x5 days.  In ER WBC 33,000 w/ Tbili 5.6, AST 131, ALT 113.  During hospitalization she had biliary stent exchange and ERCP on 09/06/20 w/ improvement in LFTs as well as pain.  After intubation developed chest congestion and wet/hacking cough.  We had an interim video visit for bronchitis and pt reports feeling much better today.  She is complaining of ongoing back pain.  Yesterday Oncology increased Morphine from 15mg  --> 30mg  BID.  Pt reports this feels better.  She also has breakthrough medication available as needed.  Pt had a normal BM today for the first time 'since I don't know when'.  D/C summary reviewed, H&P reviewed, meds reconciled, labs reviewed.  Pt saw oncology yesterday and they postponed chemo for a week.  Now scheduled for next Wednesday.    ROS:   See pertinent positives and negatives per HPI.  Patient Active Problem List   Diagnosis Date Noted   Port-A-Cath in place 09/12/2020   Abdominal pain 09/06/2020   Obstruction of biliary stent 09/06/2020   Genetic testing 04/03/2020   Chemotherapy induced nausea and vomiting 04/01/2020   Hypokalemia due to excessive gastrointestinal  loss of potassium 04/01/2020   Pancreatic cancer ([place]) 03/20/2020   Goals of care, counseling/discussion 03/20/2020   Family history of breast cancer    Family history of cystic fibrosis    Foot pain, right 11/25/2018   Collagenous colitis 02/08/2018   Family history of Alzheimer's disease 02/08/2018   Psoriasis 02/12/2015   Sacroiliac joint dysfunction of left side 02/12/2015   Bradycardia, symptomatic with fatigue 09/13/2013   Low back pain 02/23/2013   Coronary artery disease involving native coronary artery of native heart without angina pectoris 05/30/2012   Physical exam 05/04/2012   Allergic rhinitis, seasonal 03/25/2012   Hyperlipidemia 06/19/2010   Essential hypertension 06/19/2010   MYOCARDIAL INFARCTION, HX OF 06/19/2010    Social History   Tobacco Use   Smoking status: Former Smoker    Quit date: 01/28/2010    Years since quitting: 10.6   Smokeless tobacco: Never Used  Substance Use Topics   Alcohol use: Yes    Alcohol/week: 1.0 - 2.0 standard drink    Types: 1 - 2 Glasses of wine per week    Current Outpatient Medications:    albuterol (PROVENTIL) (2.5 MG/3ML) 0.083% nebulizer solution, Take 2.5 mg by nebulization every 6 (six) hours as needed for wheezing or shortness of breath., Disp: , Rfl:    ALPRAZolam (XANAX) 0.25 MG tablet, Take 1 tablet (0.25 mg total) by mouth at bedtime., Disp: 30 tablet, Rfl: 3   aspirin 81 MG tablet, Take 81 mg by mouth at bedtime. , Disp: , Rfl:    cetirizine (ZYRTEC) 10 MG tablet, Take 10 mg  by mouth daily. , Disp: , Rfl:    Cholecalciferol (VITAMIN D3) 5000 units CAPS, Take 5,000 Units by mouth at bedtime. , Disp: , Rfl:    clopidogrel (PLAVIX) 75 MG tablet, Take 75 mg by mouth daily., Disp: , Rfl:    Cod Liver Oil 5000-500 UNIT/5ML OIL, Take 1 capsule by mouth daily., Disp: , Rfl:    dexamethasone (DECADRON) 4 MG tablet, Take 2 tablets (8 mg total) by mouth 2 (two) times daily. X 3 days after each chemo.  Start on day 2, Disp: 24 tablet, Rfl: 1   doxycycline (VIBRA-TABS) 100 MG tablet, Take 1 tablet (100 mg total) by mouth 2 (two) times daily., Disp: 20 tablet, Rfl: 0   escitalopram (LEXAPRO) 10 MG tablet, TAKE 1 TABLET(10 MG) BY MOUTH DAILY (Patient taking differently: Take 10 mg by mouth daily. ), Disp: 90 tablet, Rfl: 0   guaiFENesin (MUCINEX) 600 MG 12 hr tablet, Take 600 mg by mouth 2 (two) times daily., Disp: , Rfl:    metoprolol tartrate (LOPRESSOR) 50 MG tablet, TAKE 1 TABLET(50 MG) BY MOUTH TWICE DAILY (Patient taking differently: Take 25 mg by mouth 2 (two) times daily. ), Disp: 180 tablet, Rfl: 1   montelukast (SINGULAIR) 10 MG tablet, TAKE 1 TABLET(10 MG) BY MOUTH AT BEDTIME (Patient taking differently: Take 10 mg by mouth at bedtime. ), Disp: 30 tablet, Rfl: 6   morphine (MS CONTIN) 30 MG 12 hr tablet, Take 1 tablet (30 mg total) by mouth every 12 (twelve) hours., Disp: 60 tablet, Rfl: 0   nitroGLYCERIN (NITROSTAT) 0.4 MG SL tablet, PLACE 1 TABLET UNDER THE TONGUE EVERY 5 MINUTES AS NEEDED FOR CHEST PAIN, Disp: 25 tablet, Rfl: 1   ondansetron (ZOFRAN) 8 MG tablet, Take 1 tablet (8 mg total) by mouth every 8 (eight) hours as needed for nausea or vomiting., Disp: 60 tablet, Rfl: 1   oxyCODONE-acetaminophen (PERCOCET) 10-325 MG tablet, Take 1 tablet by mouth every 4 (four) hours as needed for pain., Disp: 80 tablet, Rfl: 0   pantoprazole (PROTONIX) 40 MG tablet, Take 1 tablet (40 mg total) by mouth 2 (two) times daily., Disp: 180 tablet, Rfl: 3   polyethylene glycol (MIRALAX / GLYCOLAX) 17 g packet, Take 17 g by mouth daily as needed for mild constipation or moderate constipation. , Disp: , Rfl:    potassium chloride SA (KLOR-CON) 20 MEQ tablet, TAKE 1 TABLET(20 MEQ) BY MOUTH DAILY (Patient taking differently: Take 20 mEq by mouth daily. ), Disp: 90 tablet, Rfl: 3   predniSONE (DELTASONE) 50 MG tablet, Take #1 tablet 13 hours, 7 hours and 1 hour prior to CT scan. Take Benadryl 50  mg with 1 hour dose, Disp: 3 tablet, Rfl: 1   rosuvastatin (CRESTOR) 10 MG tablet, TAKE 1 TABLET(10 MG) BY MOUTH DAILY (Patient taking differently: Take 10 mg by mouth daily. ), Disp: 90 tablet, Rfl: 3  Allergies  Allergen Reactions   Chlorhexidine     Do not add, port not accessed   Contrast Media [Iodinated Diagnostic Agents] Hives    CT IV contrast- pt states she still has itching even with 4 hour and 13 hour preps 09/06/20    Objective:   There were no vitals taken for this visit. AAOx3, NAD NCAT, EOMI No obvious CN deficits Coloring WNL Pt is able to speak clearly, coherently without shortness of breath or increased work of breathing.  Thought process is linear.  Mood is appropriate.   Assessment and Plan:   Obstruction of  biliary stent- resolved.  Pt had stent replaced and LFTs normalized and pain has improved.  Abdominal pain- much better controlled since replacement of biliary stent and adjustment of pain medications by oncology.  She also had a regular BM this morning which was exciting.  Pancreatic cancer- following w/ Dr Benay Spice.  Together they decided to defer her next chemo tx until next Wednesday to allow more recovery.  Pt's spirits are better today.  Will follow along and assist as able.  Bronchitis- improved since our last visit.  She is doing well on Doxycycline and Mucinex.  Annye Asa, MD 09/13/2020

## 2020-09-16 ENCOUNTER — Telehealth: Payer: Self-pay | Admitting: Nurse Practitioner

## 2020-09-16 NOTE — Telephone Encounter (Signed)
Scheduled per 10/14 los, patient has been called and notified.

## 2020-09-18 ENCOUNTER — Encounter: Payer: Self-pay | Admitting: Nurse Practitioner

## 2020-09-18 ENCOUNTER — Inpatient Hospital Stay (HOSPITAL_BASED_OUTPATIENT_CLINIC_OR_DEPARTMENT_OTHER): Payer: Medicare Other | Admitting: Nurse Practitioner

## 2020-09-18 ENCOUNTER — Inpatient Hospital Stay: Payer: Medicare Other

## 2020-09-18 ENCOUNTER — Other Ambulatory Visit: Payer: Self-pay

## 2020-09-18 VITALS — BP 112/80 | HR 58 | Temp 97.5°F | Resp 18 | Ht 66.0 in | Wt 134.4 lb

## 2020-09-18 DIAGNOSIS — C250 Malignant neoplasm of head of pancreas: Secondary | ICD-10-CM | POA: Diagnosis not present

## 2020-09-18 DIAGNOSIS — Z95828 Presence of other vascular implants and grafts: Secondary | ICD-10-CM

## 2020-09-18 DIAGNOSIS — Z5111 Encounter for antineoplastic chemotherapy: Secondary | ICD-10-CM | POA: Diagnosis not present

## 2020-09-18 LAB — CBC WITH DIFFERENTIAL (CANCER CENTER ONLY)
Abs Immature Granulocytes: 0.03 10*3/uL (ref 0.00–0.07)
Basophils Absolute: 0 10*3/uL (ref 0.0–0.1)
Basophils Relative: 0 %
Eosinophils Absolute: 0.4 10*3/uL (ref 0.0–0.5)
Eosinophils Relative: 5 %
HCT: 33.5 % — ABNORMAL LOW (ref 36.0–46.0)
Hemoglobin: 10.5 g/dL — ABNORMAL LOW (ref 12.0–15.0)
Immature Granulocytes: 0 %
Lymphocytes Relative: 27 %
Lymphs Abs: 2.1 10*3/uL (ref 0.7–4.0)
MCH: 29.5 pg (ref 26.0–34.0)
MCHC: 31.3 g/dL (ref 30.0–36.0)
MCV: 94.1 fL (ref 80.0–100.0)
Monocytes Absolute: 0.6 10*3/uL (ref 0.1–1.0)
Monocytes Relative: 8 %
Neutro Abs: 4.7 10*3/uL (ref 1.7–7.7)
Neutrophils Relative %: 60 %
Platelet Count: 362 10*3/uL (ref 150–400)
RBC: 3.56 MIL/uL — ABNORMAL LOW (ref 3.87–5.11)
RDW: 15.3 % (ref 11.5–15.5)
WBC Count: 8 10*3/uL (ref 4.0–10.5)
nRBC: 0 % (ref 0.0–0.2)

## 2020-09-18 LAB — CMP (CANCER CENTER ONLY)
ALT: 12 U/L (ref 0–44)
AST: 19 U/L (ref 15–41)
Albumin: 3 g/dL — ABNORMAL LOW (ref 3.5–5.0)
Alkaline Phosphatase: 250 U/L — ABNORMAL HIGH (ref 38–126)
Anion gap: 5 (ref 5–15)
BUN: 8 mg/dL (ref 8–23)
CO2: 26 mmol/L (ref 22–32)
Calcium: 9.2 mg/dL (ref 8.9–10.3)
Chloride: 107 mmol/L (ref 98–111)
Creatinine: 0.68 mg/dL (ref 0.44–1.00)
GFR, Estimated: >60 mL/min (ref >60)
Glucose, Bld: 100 mg/dL — ABNORMAL HIGH (ref 70–99)
Potassium: 4.6 mmol/L (ref 3.5–5.1)
Sodium: 138 mmol/L (ref 135–145)
Total Bilirubin: 0.5 mg/dL (ref 0.3–1.2)
Total Protein: 6.4 g/dL — ABNORMAL LOW (ref 6.5–8.1)

## 2020-09-18 MED ORDER — ATROPINE SULFATE 1 MG/ML IJ SOLN
0.4000 mg | Freq: Once | INTRAMUSCULAR | Status: AC
  Administered 2020-09-18: 0.4 mg via INTRAVENOUS

## 2020-09-18 MED ORDER — SODIUM CHLORIDE 0.9% FLUSH
10.0000 mL | Freq: Once | INTRAVENOUS | Status: AC
  Administered 2020-09-18: 10 mL
  Filled 2020-09-18: qty 10

## 2020-09-18 MED ORDER — SODIUM CHLORIDE 0.9 % IV SOLN
16.0000 mg | Freq: Once | INTRAVENOUS | Status: AC
  Administered 2020-09-18: 16 mg via INTRAVENOUS
  Filled 2020-09-18: qty 8

## 2020-09-18 MED ORDER — LORAZEPAM 2 MG/ML IJ SOLN
0.5000 mg | Freq: Once | INTRAMUSCULAR | Status: AC
  Administered 2020-09-18: 0.5 mg via INTRAVENOUS

## 2020-09-18 MED ORDER — SODIUM CHLORIDE 0.9 % IV SOLN
150.0000 mg/m2 | Freq: Once | INTRAVENOUS | Status: AC
  Administered 2020-09-18: 260 mg via INTRAVENOUS
  Filled 2020-09-18: qty 13

## 2020-09-18 MED ORDER — SODIUM CHLORIDE 0.9 % IV SOLN
10.0000 mg | Freq: Once | INTRAVENOUS | Status: AC
  Administered 2020-09-18: 10 mg via INTRAVENOUS
  Filled 2020-09-18: qty 1
  Filled 2020-09-18: qty 10
  Filled 2020-09-18: qty 1

## 2020-09-18 MED ORDER — DEXTROSE 5 % IV SOLN
Freq: Once | INTRAVENOUS | Status: AC
  Filled 2020-09-18: qty 250

## 2020-09-18 MED ORDER — LORAZEPAM 2 MG/ML IJ SOLN
INTRAMUSCULAR | Status: AC
  Filled 2020-09-18: qty 1

## 2020-09-18 MED ORDER — SODIUM CHLORIDE 0.9 % IV SOLN
150.0000 mg | Freq: Once | INTRAVENOUS | Status: AC
  Administered 2020-09-18: 150 mg via INTRAVENOUS
  Filled 2020-09-18: qty 150
  Filled 2020-09-18 (×2): qty 5

## 2020-09-18 MED ORDER — ATROPINE SULFATE 1 MG/ML IJ SOLN
INTRAMUSCULAR | Status: AC
  Filled 2020-09-18: qty 1

## 2020-09-18 MED ORDER — SODIUM CHLORIDE 0.9 % IV SOLN
400.0000 mg/m2 | Freq: Once | INTRAVENOUS | Status: AC
  Administered 2020-09-18: 676 mg via INTRAVENOUS
  Filled 2020-09-18: qty 33.8

## 2020-09-18 MED ORDER — ATROPINE SULFATE 0.4 MG/ML IJ SOLN
INTRAMUSCULAR | Status: AC
  Filled 2020-09-18: qty 1

## 2020-09-18 MED ORDER — SODIUM CHLORIDE 0.9 % IV SOLN
2000.0000 mg/m2 | INTRAVENOUS | Status: DC
  Administered 2020-09-18: 3400 mg via INTRAVENOUS
  Filled 2020-09-18: qty 68

## 2020-09-18 NOTE — Progress Notes (Signed)
[name] OFF[facility] PROGRESS NOTE   Diagnosis: Pancreas cancer  INTERVAL HISTORY:   Vicki [name] returns as scheduled. She is feeling better. Specifically breathing has improved. She continues the antibiotic as prescribed by her PCP. She is taking less breakthrough pain medication since increasing the dose of MS Contin. Appetite is better. Bowels are moving. No nausea. Persistent numbness in the hands and feet.  Objective:  Vital signs in last 24 hours:  Blood pressure 112/80, pulse (!) 58, temperature (!) 97.5 F (36.4 C), temperature source Tympanic, resp. rate 18, height 5\' 6"  (1.676 m), weight 134 lb 6.4 oz (61 kg), SpO2 98 %.    HEENT: No thrush or ulcers. Sclera anicteric. Resp: Scattered inspiratory wheezes. No respiratory distress. Cardio: Regular rate and rhythm. GI: Abdomen soft and nontender. No hepatomegaly. Vascular: No leg edema. Skin: Palms without erythema.   Lab Results:  Lab Results  Component Value Date   WBC 8.0 09/18/2020   HGB 10.5 (L) 09/18/2020   HCT 33.5 (L) 09/18/2020   MCV 94.1 09/18/2020   PLT 362 09/18/2020   NEUTROABS 4.7 09/18/2020    Imaging:  No results found.  Medications: I have reviewed the patient's current medications.  Assessment/Plan: 1. 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 MRI abdomen 02/01/2020-2.2 x 3.3 cm mass in the posterior pancreas head/uncinate process, mild intrahepatic/extrahepatic ductal dilatation, no evidence of vascular invasion, possible small lymph nodes in the porta hepatis-poorly visualized ? EUS 02/29/2020-20 x 23 mm mass in the pancreas head, upstream pancreatic duct dilatation, 1 abnormal peripancreatic node, uT3?uN1 ? 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 CTs 03/19/2020-2.9 x 2.1 x 3.1 cm pancreas head/uncinate mass, lesion associated with infrarenal abdominal aorta and superior mesenteric artery, prominent and borderline  enlarged retroperitoneal nodes including a left periaortic node, indeterminate 4 mm lung nodule ? Cycle 1 mFOLFIRINOX 6/78/93, complicated by n/v and hospital admission 5/2/ - 5/3 ? [facility] opinion at [facility] with Dr. Earnestine Mealing and Dr. Mariah Milling 04/02/20 ? PET scan 04/03/2020-hypermetabolic poorly marginated pancreatic head mass; 2 hypermetabolic left periaortic lymph nodes, hypermetabolic left supraclavicular lymph node ? Cycle 2 FOLFIRINOX 04/11/2020 ? Ultrasound-guided biopsy of a left supraclavicular mass on 04/18/2020-poorly differentiated adenocarcinoma, cytokeratin 7+, cytokeratin 20 and CDX2 positive in rare cells. ? Cycle 3 FOLFIRINOX 04/24/2020-oxaliplatin infusion lengthened to 3 hours, Udenyca was not given (ordered) ? Cycle 4 FOLFIRINOX 05/08/2020 ? Cycle 5 FOLFIRINOX 05/22/2020-atropine and Ativan premedication added secondary to cholinergic symptoms and nausea from irinotecan ? CTs 05/29/2020-decreased left supraclavicular lymph node, slight decrease in size of the pancreas head mass, decrease in retroperitoneal lymphadenopathy ? Cycle 6 FOLFIRINOX 06/05/2020 ? Cycle 7 FOLFIRINOX 06/26/2020 ? Cycle 8 FOLFIRINOX 07/11/2020 (oxaliplatin held secondary to neuropathy) ? Cycle 9 FOLFIRINOX 07/24/2020 (oxaliplatin held secondary to neuropathy) ? Cycle 10 FOLFIRINOX 08/07/2020 (oxaliplatin held secondary to neuropathy) ? CTs 08/19/2020-subtle 5 mm peripheral right liver lesion, pancreas mass unchanged and difficult to discretely delineate, unchanged left retroperitoneal node, no evidence of disease progression, on Dr. Gearldine Shown review of the CTs there is no change in the previously enlarged left supraclavicular node ? Cycle 11 FOLFIRINOX 09/18/2020 (oxaliplatin held secondary to neuropathy)  2. Abdomen/back pain secondary to #1 3. Anorexia/weight loss secondary to #1 4. History of coronary artery disease,STEMI with the fib arrest 2011, status post RCA stent 5. Constipation, secondary to #1 and  morphine 6. Hospital admission for n/v after cycle 1 FOLFIRINOX 5/2 - 5/3 --prophylactic dexamethasone beginning day 2, Decadron  dose increased beginning with cycle 4 FOLFIRINOX 7. 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  Biliary stent obstruction 09/05/2020.  Metal stent placed into the common bile duct 09/06/2020.  Disposition: Vicki [name] appears improved as compared to last week. Plan to proceed with FOLFIRI today as scheduled, continue to hold oxaliplatin.  We reviewed the CBC and chemistry panel from today. Labs adequate to proceed with treatment. Bilirubin and transaminases have corrected into normal range.  She will return for lab, follow-up, chemotherapy in 3 weeks. She will contact the office in the interim with any problems.    [name] ANP/GNP-BC   09/18/2020  10:36 AM

## 2020-09-18 NOTE — Patient Instructions (Signed)
[facility] Discharge Instructions for Patients Receiving Chemotherapy  Today you received the following chemotherapy agents Leucovorin, Irinotecan (CAMPTOSAR) & Flourouracil (ADRUCIL).  To help prevent nausea and vomiting after your treatment, we encourage you to take your nausea medication as prescribed.  If you develop nausea and vomiting that is not controlled by your nausea medication, call the clinic.   BELOW ARE SYMPTOMS THAT SHOULD BE REPORTED IMMEDIATELY:  *FEVER GREATER THAN 100.5 F  *CHILLS WITH OR WITHOUT FEVER  NAUSEA AND VOMITING THAT IS NOT CONTROLLED WITH YOUR NAUSEA MEDICATION  *UNUSUAL SHORTNESS OF BREATH  *UNUSUAL BRUISING OR BLEEDING  TENDERNESS IN MOUTH AND THROAT WITH OR WITHOUT PRESENCE OF ULCERS  *URINARY PROBLEMS  *BOWEL PROBLEMS  UNUSUAL RASH Items with * indicate a potential emergency and should be followed up as soon as possible.  Feel free to call the clinic should you have any questions or concerns. The clinic phone number is [phone].  Please show the [facility] at check-in to the Emergency Department and triage nurse.

## 2020-09-20 ENCOUNTER — Other Ambulatory Visit: Payer: Self-pay | Admitting: Family Medicine

## 2020-09-20 ENCOUNTER — Inpatient Hospital Stay: Payer: Medicare Other

## 2020-09-20 ENCOUNTER — Other Ambulatory Visit: Payer: Self-pay

## 2020-09-20 ENCOUNTER — Telehealth: Payer: Self-pay | Admitting: Nurse Practitioner

## 2020-09-20 VITALS — BP 133/81 | HR 56 | Temp 98.4°F | Resp 18

## 2020-09-20 DIAGNOSIS — Z5111 Encounter for antineoplastic chemotherapy: Secondary | ICD-10-CM | POA: Diagnosis not present

## 2020-09-20 DIAGNOSIS — C250 Malignant neoplasm of head of pancreas: Secondary | ICD-10-CM

## 2020-09-20 MED ORDER — HEPARIN SOD (PORK) LOCK FLUSH 100 UNIT/ML IV SOLN
500.0000 [IU] | Freq: Once | INTRAVENOUS | Status: AC | PRN
  Administered 2020-09-20: 500 [IU]
  Filled 2020-09-20: qty 5

## 2020-09-20 MED ORDER — SODIUM CHLORIDE 0.9% FLUSH
10.0000 mL | INTRAVENOUS | Status: DC | PRN
  Administered 2020-09-20: 10 mL
  Filled 2020-09-20: qty 10

## 2020-09-20 MED ORDER — PEGFILGRASTIM-BMEZ 6 MG/0.6ML ~~LOC~~ SOSY
PREFILLED_SYRINGE | SUBCUTANEOUS | Status: AC
  Filled 2020-09-20: qty 0.6

## 2020-09-20 MED ORDER — PEGFILGRASTIM-BMEZ 6 MG/0.6ML ~~LOC~~ SOSY
6.0000 mg | PREFILLED_SYRINGE | Freq: Once | SUBCUTANEOUS | Status: AC
  Administered 2020-09-20: 6 mg via SUBCUTANEOUS

## 2020-09-20 NOTE — Patient Instructions (Signed)

## 2020-09-20 NOTE — Telephone Encounter (Signed)
Scheduled appointment per 10/20 los. Spoke to patient who is aware of appointment date and time.  

## 2020-09-20 NOTE — Telephone Encounter (Signed)
Xanax last rx 04/09/20 #30 3 RF LOV: 09/13/20

## 2020-09-24 ENCOUNTER — Other Ambulatory Visit: Payer: Self-pay | Admitting: Oncology

## 2020-09-24 ENCOUNTER — Telehealth: Payer: Self-pay | Admitting: *Deleted

## 2020-09-24 MED ORDER — OXYCODONE-ACETAMINOPHEN 10-325 MG PO TABS: 1.0000 | ORAL_TABLET | ORAL | 0 refills | Status: DC | PRN

## 2020-09-24 NOTE — Telephone Encounter (Signed)
Needs refill on oxycodone/apap 10/325. MD notified.

## 2020-10-07 ENCOUNTER — Other Ambulatory Visit: Payer: Self-pay | Admitting: Oncology

## 2020-10-08 ENCOUNTER — Inpatient Hospital Stay: Payer: Medicare Other

## 2020-10-08 ENCOUNTER — Inpatient Hospital Stay (HOSPITAL_BASED_OUTPATIENT_CLINIC_OR_DEPARTMENT_OTHER): Payer: Medicare Other | Admitting: Nurse Practitioner

## 2020-10-08 ENCOUNTER — Other Ambulatory Visit: Payer: Self-pay

## 2020-10-08 ENCOUNTER — Encounter: Payer: Self-pay | Admitting: Nurse Practitioner

## 2020-10-08 ENCOUNTER — Inpatient Hospital Stay: Payer: Medicare Other | Attending: Oncology

## 2020-10-08 VITALS — BP 128/65 | HR 64 | Temp 97.6°F | Resp 16 | Ht 66.0 in | Wt 130.7 lb

## 2020-10-08 DIAGNOSIS — Z5189 Encounter for other specified aftercare: Secondary | ICD-10-CM | POA: Insufficient documentation

## 2020-10-08 DIAGNOSIS — C250 Malignant neoplasm of head of pancreas: Secondary | ICD-10-CM | POA: Diagnosis present

## 2020-10-08 DIAGNOSIS — Z5111 Encounter for antineoplastic chemotherapy: Secondary | ICD-10-CM | POA: Diagnosis present

## 2020-10-08 DIAGNOSIS — Z95828 Presence of other vascular implants and grafts: Secondary | ICD-10-CM

## 2020-10-08 LAB — CBC WITH DIFFERENTIAL (CANCER CENTER ONLY)
Abs Immature Granulocytes: 0.03 10*3/uL (ref 0.00–0.07)
Basophils Absolute: 0 10*3/uL (ref 0.0–0.1)
Basophils Relative: 0 %
Eosinophils Absolute: 0.1 10*3/uL (ref 0.0–0.5)
Eosinophils Relative: 2 %
HCT: 33.4 % — ABNORMAL LOW (ref 36.0–46.0)
Hemoglobin: 10.8 g/dL — ABNORMAL LOW (ref 12.0–15.0)
Immature Granulocytes: 0 %
Lymphocytes Relative: 25 %
Lymphs Abs: 1.9 10*3/uL (ref 0.7–4.0)
MCH: 29.4 pg (ref 26.0–34.0)
MCHC: 32.3 g/dL (ref 30.0–36.0)
MCV: 91 fL (ref 80.0–100.0)
Monocytes Absolute: 0.7 10*3/uL (ref 0.1–1.0)
Monocytes Relative: 9 %
Neutro Abs: 4.9 10*3/uL (ref 1.7–7.7)
Neutrophils Relative %: 64 %
Platelet Count: 285 10*3/uL (ref 150–400)
RBC: 3.67 MIL/uL — ABNORMAL LOW (ref 3.87–5.11)
RDW: 15.1 % (ref 11.5–15.5)
WBC Count: 7.6 10*3/uL (ref 4.0–10.5)
nRBC: 0 % (ref 0.0–0.2)

## 2020-10-08 LAB — CMP (CANCER CENTER ONLY)
ALT: 6 U/L (ref 0–44)
AST: 17 U/L (ref 15–41)
Albumin: 3.3 g/dL — ABNORMAL LOW (ref 3.5–5.0)
Alkaline Phosphatase: 137 U/L — ABNORMAL HIGH (ref 38–126)
Anion gap: 6 (ref 5–15)
BUN: 10 mg/dL (ref 8–23)
CO2: 25 mmol/L (ref 22–32)
Calcium: 8.7 mg/dL — ABNORMAL LOW (ref 8.9–10.3)
Chloride: 107 mmol/L (ref 98–111)
Creatinine: 0.65 mg/dL (ref 0.44–1.00)
GFR, Estimated: >60 mL/min (ref >60)
Glucose, Bld: 92 mg/dL (ref 70–99)
Potassium: 4.4 mmol/L (ref 3.5–5.1)
Sodium: 138 mmol/L (ref 135–145)
Total Bilirubin: 0.5 mg/dL (ref 0.3–1.2)
Total Protein: 6 g/dL — ABNORMAL LOW (ref 6.5–8.1)

## 2020-10-08 MED ORDER — DEXTROSE 5 % IV SOLN
Freq: Once | INTRAVENOUS | Status: AC
  Filled 2020-10-08: qty 250

## 2020-10-08 MED ORDER — ATROPINE SULFATE 0.4 MG/ML IJ SOLN
INTRAMUSCULAR | Status: AC
  Filled 2020-10-08: qty 1

## 2020-10-08 MED ORDER — SODIUM CHLORIDE 0.9 % IV SOLN
16.0000 mg | Freq: Once | INTRAVENOUS | Status: AC
  Administered 2020-10-08: 16 mg via INTRAVENOUS
  Filled 2020-10-08: qty 8

## 2020-10-08 MED ORDER — MORPHINE SULFATE ER 60 MG PO TBCR: 60.0000 mg | EXTENDED_RELEASE_TABLET | Freq: Every morning | ORAL | 0 refills | Status: DC

## 2020-10-08 MED ORDER — SODIUM CHLORIDE 0.9 % IV SOLN
10.0000 mg | Freq: Once | INTRAVENOUS | Status: AC
  Administered 2020-10-08: 10 mg via INTRAVENOUS
  Filled 2020-10-08: qty 10

## 2020-10-08 MED ORDER — HEPARIN SOD (PORK) LOCK FLUSH 100 UNIT/ML IV SOLN
500.0000 [IU] | Freq: Once | INTRAVENOUS | Status: DC | PRN
  Filled 2020-10-08: qty 5

## 2020-10-08 MED ORDER — SODIUM CHLORIDE 0.9 % IV SOLN
150.0000 mg | Freq: Once | INTRAVENOUS | Status: AC
  Administered 2020-10-08: 150 mg via INTRAVENOUS
  Filled 2020-10-08: qty 150

## 2020-10-08 MED ORDER — SODIUM CHLORIDE 0.9 % IV SOLN
400.0000 mg/m2 | Freq: Once | INTRAVENOUS | Status: AC
  Administered 2020-10-08: 676 mg via INTRAVENOUS
  Filled 2020-10-08: qty 33.8

## 2020-10-08 MED ORDER — SODIUM CHLORIDE 0.9% FLUSH
10.0000 mL | Freq: Once | INTRAVENOUS | Status: AC
  Administered 2020-10-08: 10 mL
  Filled 2020-10-08: qty 10

## 2020-10-08 MED ORDER — SODIUM CHLORIDE 0.9% FLUSH
10.0000 mL | INTRAVENOUS | Status: DC | PRN
  Filled 2020-10-08: qty 10

## 2020-10-08 MED ORDER — LORAZEPAM 2 MG/ML IJ SOLN
INTRAMUSCULAR | Status: AC
  Filled 2020-10-08: qty 1

## 2020-10-08 MED ORDER — GABAPENTIN 100 MG PO CAPS: 200.0000 mg | ORAL_CAPSULE | Freq: Every day | ORAL | 0 refills | Status: DC

## 2020-10-08 MED ORDER — MORPHINE SULFATE ER 30 MG PO TBCR: 30.0000 mg | EXTENDED_RELEASE_TABLET | Freq: Every day | ORAL | 0 refills | Status: DC

## 2020-10-08 MED ORDER — ATROPINE SULFATE 1 MG/ML IJ SOLN
0.4000 mg | Freq: Once | INTRAMUSCULAR | Status: AC
  Administered 2020-10-08: 0.4 mg via INTRAVENOUS

## 2020-10-08 MED ORDER — SODIUM CHLORIDE 0.9 % IV SOLN
2000.0000 mg/m2 | INTRAVENOUS | Status: DC
  Administered 2020-10-08: 3400 mg via INTRAVENOUS
  Filled 2020-10-08: qty 68

## 2020-10-08 MED ORDER — LORAZEPAM 2 MG/ML IJ SOLN
0.5000 mg | Freq: Once | INTRAMUSCULAR | Status: AC
  Administered 2020-10-08: 0.5 mg via INTRAVENOUS

## 2020-10-08 MED ORDER — SODIUM CHLORIDE 0.9 % IV SOLN
150.0000 mg/m2 | Freq: Once | INTRAVENOUS | Status: AC
  Administered 2020-10-08: 260 mg via INTRAVENOUS
  Filled 2020-10-08: qty 13

## 2020-10-08 NOTE — Patient Instructions (Signed)

## 2020-10-08 NOTE — Progress Notes (Addendum)
[place] OFFICE PROGRESS NOTE   Diagnosis: Pancreas cancer  INTERVAL HISTORY:   Ms. Nachtigal returns as scheduled.  She completed a cycle of FOLFIRI 09/18/2020.  She denies nausea/vomiting.  No mouth sores.  Bowels alternate constipation with diarrhea.  Neuropathy symptoms are worse, now with associated pain.  Back pain has increased and now involves the abdomen.  She is on MS Contin 30 mg every 12 hours with oxycodone every 4 hours.  At times she has to get up during the night for a dose of oxycodone.  She is eating "okay".  She has lost some weight.  Objective:  Vital signs in last 24 hours:  Blood pressure 128/65, pulse 64, temperature 97.6 F (36.4 C), temperature source Tympanic, resp. rate 16, height 5\' 6"  (1.676 m), weight 130 lb 11.2 oz (59.3 kg), SpO2 99 %.    HEENT: No thrush or ulcers. Resp: Distant breath sounds. Cardio: Regular rate and rhythm. GI: Abdomen soft and nontender.  No hepatomegaly. Vascular: No leg edema. Port-A-Cath without erythema.  Lab Results:  Lab Results  Component Value Date   WBC 7.6 10/08/2020   HGB 10.8 (L) 10/08/2020   HCT 33.4 (L) 10/08/2020   MCV 91.0 10/08/2020   PLT 285 10/08/2020   NEUTROABS 4.9 10/08/2020    Imaging:  No results found.  Medications: I have reviewed the patient's current medications.  Assessment/Plan: 1. 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 MRI abdomen 02/01/2020-2.2 x 3.3 cm mass in the posterior pancreas head/uncinate process, mild intrahepatic/extrahepatic ductal dilatation, no evidence of vascular invasion, possible small lymph nodes in the porta hepatis-poorly visualized ? EUS 02/29/2020-20 x 23 mm mass in the pancreas head, upstream pancreatic duct dilatation, 1 abnormal peripancreatic node, uT3?uN1 ? 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 CTs 03/19/2020-2.9 x 2.1 x 3.1 cm pancreas head/uncinate mass, lesion associated  with infrarenal abdominal aorta and superior mesenteric artery, prominent and borderline enlarged retroperitoneal nodes including a left periaortic node, indeterminate 4 mm lung nodule ? Cycle 1 mFOLFIRINOX 03/08/80, complicated by n/v and hospital admission 5/2/ - 5/3 ? Second opinion at [facility] with Dr. Earnestine Mealing and Dr. Mariah Milling 04/02/20 ? PET scan 04/03/2020-hypermetabolic poorly marginated pancreatic head mass; 2 hypermetabolic left periaortic lymph nodes, hypermetabolic left supraclavicular lymph node ? Cycle 2 FOLFIRINOX 04/11/2020 ? Ultrasound-guided biopsy of a left supraclavicular mass on 04/18/2020-poorly differentiated adenocarcinoma, cytokeratin 7+, cytokeratin 20 and CDX2 positive in rare cells. ? Cycle 3 FOLFIRINOX 04/24/2020-oxaliplatin infusion lengthened to 3 hours, Udenyca was not given (ordered) ? Cycle 4 FOLFIRINOX 05/08/2020 ? Cycle 5 FOLFIRINOX 05/22/2020-atropine and Ativan premedication added secondary to cholinergic symptoms and nausea from irinotecan ? CTs 05/29/2020-decreased left supraclavicular lymph node, slight decrease in size of the pancreas head mass, decrease in retroperitoneal lymphadenopathy ? Cycle 6 FOLFIRINOX 06/05/2020 ? Cycle 7 FOLFIRINOX 06/26/2020 ? Cycle 8 FOLFIRINOX 07/11/2020 (oxaliplatin held secondary to neuropathy) ? Cycle 9 FOLFIRINOX 07/24/2020 (oxaliplatin held secondary to neuropathy) ? Cycle 10 FOLFIRINOX 08/07/2020 (oxaliplatin held secondary to neuropathy) ? CTs 08/19/2020-subtle 5 mm peripheral right liver lesion, pancreas mass unchanged and difficult to discretely delineate, unchanged left retroperitoneal node, no evidence of disease progression, on Dr. Gearldine Shown review of the CTs there is no change in the previously enlarged left supraclavicular node ? Cycle 11 FOLFIRINOX 09/18/2020 (oxaliplatin held secondary to neuropathy) ? Cycle 12 FOLFIRINOX 10/08/2020 (oxaliplatin held secondary to neuropathy)  2. Abdomen/back pain secondary to #1 3. Anorexia/weight  loss secondary to #1  4. History of coronary artery disease,STEMI with the fib arrest 2011, status post RCA stent 5. Constipation, secondary to #1 and morphine 6. 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 Metal stent placed into the common bile duct 09/06/2020.   Disposition: Ms. Towe appears stable.  She has completed 11 cycles of systemic therapy, currently receiving FOLFIRI with oxaliplatin on hold secondary to neuropathy.  Plan to proceed with cycle 12 FOLFIRI today as scheduled.  Continue to hold oxaliplatin.  We reviewed the CBC from today.  Counts adequate to proceed with treatment.  She is experiencing neuropathic type pain in the hands and feet.  She will begin gabapentin 200 mg at bedtime.  We discussed the possibility of sedation with gabapentin.  For the increased pain in the back and abdomen we adjusted the MS Contin to 60 mg every morning and 30 mg at bedtime.  She will continue oxycodone as needed for breakthrough pain.  She will return for lab, follow-up, chemotherapy as scheduled on 10/30/2020.  She will contact the office in the interim with any problems.  Patient seen with Dr. Benay Spice.    [name] ANP/GNP-BC   10/08/2020  11:50 AM  This was a shared visit with [name]. Ms. Kincannon has developed increased back pain, likely secondary to pancreas cancer. We adjusted the narcotic pain regimen today. She will begin gabapentin for pain related to oxaliplatin neuropathy. This may also help the back pain. She will contact us if these adjustments do not help the pain.  Julieanne Manson, MD

## 2020-10-09 ENCOUNTER — Encounter: Payer: Self-pay | Admitting: Family Medicine

## 2020-10-09 ENCOUNTER — Telehealth: Payer: Self-pay | Admitting: Nurse Practitioner

## 2020-10-09 ENCOUNTER — Other Ambulatory Visit: Payer: Medicare Other

## 2020-10-09 ENCOUNTER — Ambulatory Visit: Payer: Medicare Other | Admitting: Oncology

## 2020-10-09 NOTE — Telephone Encounter (Signed)
Scheduled appointments per 11/9 los. Will have updated calendar printed for patient at next visit.

## 2020-10-10 ENCOUNTER — Inpatient Hospital Stay: Payer: Medicare Other

## 2020-10-10 ENCOUNTER — Other Ambulatory Visit: Payer: Self-pay

## 2020-10-10 VITALS — BP 124/70 | HR 55 | Temp 98.7°F | Resp 18

## 2020-10-10 DIAGNOSIS — Z5111 Encounter for antineoplastic chemotherapy: Secondary | ICD-10-CM | POA: Diagnosis not present

## 2020-10-10 DIAGNOSIS — C250 Malignant neoplasm of head of pancreas: Secondary | ICD-10-CM

## 2020-10-10 MED ORDER — SODIUM CHLORIDE 0.9% FLUSH
10.0000 mL | INTRAVENOUS | Status: DC | PRN
  Administered 2020-10-10: 10 mL
  Filled 2020-10-10: qty 10

## 2020-10-10 MED ORDER — PEGFILGRASTIM-BMEZ 6 MG/0.6ML ~~LOC~~ SOSY
PREFILLED_SYRINGE | SUBCUTANEOUS | Status: AC
  Filled 2020-10-10: qty 0.6

## 2020-10-10 MED ORDER — HEPARIN SOD (PORK) LOCK FLUSH 100 UNIT/ML IV SOLN
500.0000 [IU] | Freq: Once | INTRAVENOUS | Status: AC | PRN
  Administered 2020-10-10: 500 [IU]
  Filled 2020-10-10: qty 5

## 2020-10-10 MED ORDER — PEGFILGRASTIM-BMEZ 6 MG/0.6ML ~~LOC~~ SOSY
6.0000 mg | PREFILLED_SYRINGE | Freq: Once | SUBCUTANEOUS | Status: AC
  Administered 2020-10-10: 6 mg via SUBCUTANEOUS

## 2020-10-10 NOTE — Progress Notes (Signed)
Pt stated feeling like her face and mouth was swollen as well as feeling as if her body was "in a different land" after starting gabapentin 10/09/2020. Pt requested to drop to 100 mg instead of prescribed 200 mg.  Doctor Benay Spice and nurse Merceda Elks notified and OK to drop to 100 mg.

## 2020-10-11 ENCOUNTER — Telehealth: Payer: Self-pay | Admitting: *Deleted

## 2020-10-11 NOTE — Telephone Encounter (Signed)
Patient reports she is not able to fill the MS Contin 60 mg tablet sent in on 10/08/20--was told it needs an authorization. She was increased to 60 mg am and 30 mg pm. Called pharmacy and was informed only the 60 mg tablet needs to be authorized. Call [phone]. Forwarded message to [place], [place]

## 2020-10-27 ENCOUNTER — Other Ambulatory Visit: Payer: Self-pay | Admitting: Oncology

## 2020-10-28 ENCOUNTER — Other Ambulatory Visit: Payer: Self-pay | Admitting: Oncology

## 2020-10-28 ENCOUNTER — Telehealth: Payer: Self-pay | Admitting: *Deleted

## 2020-10-28 MED ORDER — OXYCODONE-ACETAMINOPHEN 10-325 MG PO TABS: 1.0000 | ORAL_TABLET | ORAL | 0 refills | Status: DC | PRN

## 2020-10-28 NOTE — Telephone Encounter (Signed)
Left message requesting refill on oxycodone-apap (only has #2 left). MD notified.

## 2020-10-30 ENCOUNTER — Other Ambulatory Visit: Payer: Self-pay

## 2020-10-30 ENCOUNTER — Inpatient Hospital Stay: Payer: Medicare Other

## 2020-10-30 ENCOUNTER — Inpatient Hospital Stay: Payer: Medicare Other | Admitting: Nutrition

## 2020-10-30 ENCOUNTER — Encounter: Payer: Self-pay | Admitting: Nurse Practitioner

## 2020-10-30 ENCOUNTER — Inpatient Hospital Stay: Payer: Medicare Other | Attending: Oncology

## 2020-10-30 ENCOUNTER — Inpatient Hospital Stay (HOSPITAL_BASED_OUTPATIENT_CLINIC_OR_DEPARTMENT_OTHER): Payer: Medicare Other | Admitting: Nurse Practitioner

## 2020-10-30 VITALS — BP 110/65 | HR 62 | Temp 97.6°F | Ht 66.0 in | Wt 132.2 lb

## 2020-10-30 VITALS — Resp 17

## 2020-10-30 DIAGNOSIS — Z5111 Encounter for antineoplastic chemotherapy: Secondary | ICD-10-CM | POA: Insufficient documentation

## 2020-10-30 DIAGNOSIS — C250 Malignant neoplasm of head of pancreas: Secondary | ICD-10-CM | POA: Diagnosis present

## 2020-10-30 DIAGNOSIS — Z5189 Encounter for other specified aftercare: Secondary | ICD-10-CM | POA: Diagnosis not present

## 2020-10-30 LAB — CMP (CANCER CENTER ONLY)
ALT: 7 U/L (ref 0–44)
AST: 16 U/L (ref 15–41)
Albumin: 3.1 g/dL — ABNORMAL LOW (ref 3.5–5.0)
Alkaline Phosphatase: 132 U/L — ABNORMAL HIGH (ref 38–126)
Anion gap: 7 (ref 5–15)
BUN: 11 mg/dL (ref 8–23)
CO2: 23 mmol/L (ref 22–32)
Calcium: 8.9 mg/dL (ref 8.9–10.3)
Chloride: 108 mmol/L (ref 98–111)
Creatinine: 0.62 mg/dL (ref 0.44–1.00)
GFR, Estimated: >60 mL/min (ref >60)
Glucose, Bld: 94 mg/dL (ref 70–99)
Potassium: 4.5 mmol/L (ref 3.5–5.1)
Sodium: 138 mmol/L (ref 135–145)
Total Bilirubin: 0.3 mg/dL (ref 0.3–1.2)
Total Protein: 6.2 g/dL — ABNORMAL LOW (ref 6.5–8.1)

## 2020-10-30 LAB — CBC WITH DIFFERENTIAL (CANCER CENTER ONLY)
Abs Immature Granulocytes: 0.02 10*3/uL (ref 0.00–0.07)
Basophils Absolute: 0 10*3/uL (ref 0.0–0.1)
Basophils Relative: 1 %
Eosinophils Absolute: 0.3 10*3/uL (ref 0.0–0.5)
Eosinophils Relative: 4 %
HCT: 33.7 % — ABNORMAL LOW (ref 36.0–46.0)
Hemoglobin: 10.9 g/dL — ABNORMAL LOW (ref 12.0–15.0)
Immature Granulocytes: 0 %
Lymphocytes Relative: 23 %
Lymphs Abs: 1.9 10*3/uL (ref 0.7–4.0)
MCH: 29.2 pg (ref 26.0–34.0)
MCHC: 32.3 g/dL (ref 30.0–36.0)
MCV: 90.3 fL (ref 80.0–100.0)
Monocytes Absolute: 0.7 10*3/uL (ref 0.1–1.0)
Monocytes Relative: 9 %
Neutro Abs: 5.3 10*3/uL (ref 1.7–7.7)
Neutrophils Relative %: 63 %
Platelet Count: 264 10*3/uL (ref 150–400)
RBC: 3.73 MIL/uL — ABNORMAL LOW (ref 3.87–5.11)
RDW: 15.7 % — ABNORMAL HIGH (ref 11.5–15.5)
WBC Count: 8.3 10*3/uL (ref 4.0–10.5)
nRBC: 0 % (ref 0.0–0.2)

## 2020-10-30 MED ORDER — MORPHINE SULFATE ER 60 MG PO TBCR: 60.0000 mg | EXTENDED_RELEASE_TABLET | Freq: Two times a day (BID) | ORAL | 0 refills | Status: DC

## 2020-10-30 MED ORDER — SODIUM CHLORIDE 0.9 % IV SOLN
10.0000 mg | Freq: Once | INTRAVENOUS | Status: AC
  Administered 2020-10-30: 10 mg via INTRAVENOUS
  Filled 2020-10-30: qty 10

## 2020-10-30 MED ORDER — ATROPINE SULFATE 1 MG/ML IJ SOLN
0.4000 mg | Freq: Once | INTRAMUSCULAR | Status: AC
  Administered 2020-10-30: 0.4 mg via INTRAVENOUS

## 2020-10-30 MED ORDER — SODIUM CHLORIDE 0.9 % IV SOLN
16.0000 mg | Freq: Once | INTRAVENOUS | Status: AC
  Administered 2020-10-30: 16 mg via INTRAVENOUS
  Filled 2020-10-30: qty 8

## 2020-10-30 MED ORDER — SODIUM CHLORIDE 0.9 % IV SOLN
INTRAVENOUS | Status: DC
  Filled 2020-10-30: qty 250

## 2020-10-30 MED ORDER — LORAZEPAM 2 MG/ML IJ SOLN
INTRAMUSCULAR | Status: AC
  Filled 2020-10-30: qty 1

## 2020-10-30 MED ORDER — DEXTROSE 5 % IV SOLN
Freq: Once | INTRAVENOUS | Status: DC
  Filled 2020-10-30: qty 250

## 2020-10-30 MED ORDER — SODIUM CHLORIDE 0.9 % IV SOLN
400.0000 mg/m2 | Freq: Once | INTRAVENOUS | Status: AC
  Administered 2020-10-30: 676 mg via INTRAVENOUS
  Filled 2020-10-30: qty 33.8

## 2020-10-30 MED ORDER — SODIUM CHLORIDE 0.9 % IV SOLN
150.0000 mg/m2 | Freq: Once | INTRAVENOUS | Status: AC
  Administered 2020-10-30: 260 mg via INTRAVENOUS
  Filled 2020-10-30: qty 13

## 2020-10-30 MED ORDER — ATROPINE SULFATE 0.4 MG/ML IJ SOLN
INTRAMUSCULAR | Status: AC
  Filled 2020-10-30: qty 1

## 2020-10-30 MED ORDER — SODIUM CHLORIDE 0.9 % IV SOLN
150.0000 mg | Freq: Once | INTRAVENOUS | Status: AC
  Administered 2020-10-30: 150 mg via INTRAVENOUS
  Filled 2020-10-30: qty 150

## 2020-10-30 MED ORDER — LORAZEPAM 2 MG/ML IJ SOLN
0.5000 mg | Freq: Once | INTRAMUSCULAR | Status: AC
  Administered 2020-10-30: 0.5 mg via INTRAVENOUS

## 2020-10-30 MED ORDER — ATROPINE SULFATE 1 MG/ML IJ SOLN
INTRAMUSCULAR | Status: AC
  Filled 2020-10-30: qty 1

## 2020-10-30 MED ORDER — SODIUM CHLORIDE 0.9 % IV SOLN
2000.0000 mg/m2 | INTRAVENOUS | Status: DC
  Administered 2020-10-30: 3400 mg via INTRAVENOUS
  Filled 2020-10-30: qty 68

## 2020-10-30 NOTE — Patient Instructions (Addendum)
[facility] Discharge Instructions for Patients Receiving Chemotherapy  Today you received the following chemotherapy agents: Irinotecan, leucovorin, 5FU  To help prevent nausea and vomiting after your treatment, we encourage you to take your nausea medication as directed.   If you develop nausea and vomiting that is not controlled by your nausea medication, call the clinic.   BELOW ARE SYMPTOMS THAT SHOULD BE REPORTED IMMEDIATELY:  *FEVER GREATER THAN 100.5 F  *CHILLS WITH OR WITHOUT FEVER  NAUSEA AND VOMITING THAT IS NOT CONTROLLED WITH YOUR NAUSEA MEDICATION  *UNUSUAL SHORTNESS OF BREATH  *UNUSUAL BRUISING OR BLEEDING  TENDERNESS IN MOUTH AND THROAT WITH OR WITHOUT PRESENCE OF ULCERS  *URINARY PROBLEMS  *BOWEL PROBLEMS  UNUSUAL RASH Items with * indicate a potential emergency and should be followed up as soon as possible.  Feel free to call the clinic should you have any questions or concerns. The clinic phone number is [phone].  Please show the CHEMO ALERT CARD at check-in to the Emergency Department and triage nurse.   

## 2020-10-30 NOTE — Progress Notes (Addendum)
[name] OFF[facility] PROGRESS NOTE   Diagnosis: Pancreas cancer  INTERVAL HISTORY:   Vicki [name] returns as scheduled.  She completed another cycle of FOLFIRI 10/08/2020.  She denies nausea/vomiting.  She had a mouth sore which she thinks was unrelated to chemotherapy.  Bowels continue to alternate constipation and diarrhea.  Back pain is worse.  She is taking frequent oxycodone.  She continues MS Contin 30 mg every morning and 60 mg every afternoon.  She wakes up during the night in pain.  She is setting her alarm to take breakthrough pain medication.  Hands and feet continue to be numb.  Hands painful at bedtime.  She decreased the Neurontin to a half dose due to poor tolerance.  She notes some improvement in the hand pain.  Objective:  Vital signs in last 24 hours:  Blood pressure 110/65, pulse 62, temperature 97.6 F (36.4 C), temperature source Tympanic, height 5\' 6"  (1.676 m), weight 132 lb 3.2 oz (60 kg), SpO2 100 %.    HEENT: No thrush or ulcers. Resp: Lungs clear bilaterally. Cardio: Regular rate and rhythm. GI: No hepatomegaly.  No mass. Vascular: No leg edema. Port-A-Cath without erythema.  Lab Results:  Lab Results  Component Value Date   WBC 8.3 10/30/2020   HGB 10.9 (L) 10/30/2020   HCT 33.7 (L) 10/30/2020   MCV 90.3 10/30/2020   PLT 264 10/30/2020   NEUTROABS 5.3 10/30/2020    Imaging:  No results found.  Medications: I have reviewed the patient's current medications.  Assessment/Plan: 1. 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 MRI abdomen 02/01/2020-2.2 x 3.3 cm mass in the posterior pancreas head/uncinate process, mild intrahepatic/extrahepatic ductal dilatation, no evidence of vascular invasion, possible small lymph nodes in the porta hepatis-poorly visualized ? EUS 02/29/2020-20 x 23 mm mass in the pancreas head, upstream pancreatic duct dilatation, 1 abnormal peripancreatic node, uT3?uN1 ? ERCP  03/05/2020-common bile duct stricture, uncovered metal stent placed ? CTs 03/19/2020-2.9 x 2.1 x 3.1 cm pancreas head/uncinate mass, lesion associated with infrarenal abdominal aorta and superior mesenteric artery, prominent and borderline enlarged retroperitoneal nodes including a left periaortic node, indeterminate 4 mm lung nodule ? Cycle 1 mFOLFIRINOX 08/15/93, complicated by n/v and hospital admission 5/2/ - 5/3 ? Second opinion at [facility] with Dr. Earnestine Mealing and Dr. Mariah Milling 04/02/20 ? PET scan 04/03/2020-hypermetabolic poorly marginated pancreatic head mass; 2 hypermetabolic left periaortic lymph nodes, hypermetabolic left supraclavicular lymph node ? Cycle 2 FOLFIRINOX 04/11/2020 ? Ultrasound-guided biopsy of a left supraclavicular mass on 04/18/2020-poorly differentiated adenocarcinoma, cytokeratin 7+, cytokeratin 20 and CDX2 positive in rare cells. ? Cycle 3 FOLFIRINOX 04/24/2020-oxaliplatin infusion lengthened to 3 hours, Udenyca was not given (ordered) ? Cycle 4 FOLFIRINOX 05/08/2020 ? Cycle 5 FOLFIRINOX 05/22/2020-atropine and Ativan premedication added secondary to cholinergic symptoms and nausea from irinotecan ? CTs 05/29/2020-decreased left supraclavicular lymph node, slight decrease in size of the pancreas head mass, decrease in retroperitoneal lymphadenopathy ? Cycle 6 FOLFIRINOX 06/05/2020 ? Cycle 7 FOLFIRINOX 06/26/2020 ? Cycle 8 FOLFIRINOX 07/11/2020 (oxaliplatin held secondary to neuropathy) ? Cycle 9 FOLFIRINOX 07/24/2020 (oxaliplatin held secondary to neuropathy) ? Cycle 10 FOLFIRINOX 08/07/2020 (oxaliplatin held secondary to neuropathy) ? CTs 08/19/2020-subtle 5 mm peripheral right liver lesion, pancreas mass unchanged and difficult to discretely delineate, unchanged left retroperitoneal node, no evidence of disease progression, onDr. Sherrill'sreview of the CTs there is no change in the previously enlarged left supraclavicular node ? Cycle 11 FOLFIRINOX 09/18/2020 (oxaliplatin held secondary to  neuropathy) ?  Cycle 12 FOLFIRINOX 10/08/2020 (oxaliplatin held secondary to neuropathy) ? Cycle 13 FOLFIRINOX 10/30/2020 (oxaliplatin held secondary to neuropathy)  2. Abdomen/back pain secondary to #1 3. Anorexia/weight loss secondary to #1 4. History of coronary artery disease,STEMI with the fib arrest 2011, status post RCA stent 5. Constipation, secondary to #1 and morphine 6. 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 Metal stent placed into the common bile duct 09/06/2020.   Disposition: Vicki [name] appears unchanged.  She has completed 2 cycles of FOLFIRI since the most recent restaging CT scan.  Plan to proceed with FOLFIRI today as scheduled.  She will have restaging CT scans prior to her next visit.  We reviewed the CBC and chemistry panel from today.  Labs adequate to proceed as above.  We adjusted the pain regimen, she will increase MS Contin to 60 mg every 12 hours and take Percocet 1 or 2 tablets every 4 hours as needed for breakthrough pain.  She will return for follow-up as scheduled on 11/18/2020.  She will contact the office in the interim with any problems.  Patient seen with Dr. Benay Spice.    [name] ANP/GNP-BC   10/30/2020  9:58 AM  This was a shared visit with [name].  Vicki [name] is tolerating the FOLFIRI well.  She has increased pain.  We adjusted the narcotic regimen today.  She will undergo a restaging CT evaluation prior to an office visit in 3 weeks.  Julieanne Manson, MD

## 2020-10-30 NOTE — Progress Notes (Signed)
Nutrition follow-up completed with patient during infusion for pancreas cancer. Patient reports she had some difficulty eating after her stent became clogged and needed to be replaced.  Since then, her appetite has improved and she is eating better. Current weight documented as 132.2 pounds on December 1. Reports she is taking it 1 day at a time. She has no specific nutrition needs at this time.   She has a good knowledge of food she tolerates and how to increase her intake. No follow-up has been scheduled but patient has my contact information for any nutrition needs or questions.

## 2020-10-31 LAB — CANCER ANTIGEN 19-9: CA 19-9: 50 U/mL — ABNORMAL HIGH (ref 0–35)

## 2020-11-01 ENCOUNTER — Other Ambulatory Visit: Payer: Self-pay

## 2020-11-01 ENCOUNTER — Inpatient Hospital Stay: Payer: Medicare Other

## 2020-11-01 ENCOUNTER — Encounter: Payer: Self-pay | Admitting: *Deleted

## 2020-11-01 VITALS — BP 126/66 | HR 58 | Resp 18

## 2020-11-01 DIAGNOSIS — C250 Malignant neoplasm of head of pancreas: Secondary | ICD-10-CM

## 2020-11-01 DIAGNOSIS — Z5111 Encounter for antineoplastic chemotherapy: Secondary | ICD-10-CM | POA: Diagnosis not present

## 2020-11-01 MED ORDER — PEGFILGRASTIM-BMEZ 6 MG/0.6ML ~~LOC~~ SOSY
6.0000 mg | PREFILLED_SYRINGE | Freq: Once | SUBCUTANEOUS | Status: AC
  Administered 2020-11-01: 6 mg via SUBCUTANEOUS

## 2020-11-01 MED ORDER — HEPARIN SOD (PORK) LOCK FLUSH 100 UNIT/ML IV SOLN
500.0000 [IU] | Freq: Once | INTRAVENOUS | Status: AC | PRN
  Administered 2020-11-01: 500 [IU]
  Filled 2020-11-01: qty 5

## 2020-11-01 MED ORDER — PEGFILGRASTIM-BMEZ 6 MG/0.6ML ~~LOC~~ SOSY
PREFILLED_SYRINGE | SUBCUTANEOUS | Status: AC
  Filled 2020-11-01: qty 0.6

## 2020-11-01 MED ORDER — SODIUM CHLORIDE 0.9% FLUSH
10.0000 mL | INTRAVENOUS | Status: DC | PRN
  Administered 2020-11-01: 10 mL
  Filled 2020-11-01: qty 10

## 2020-11-01 NOTE — Progress Notes (Signed)
Received fax from [facility] that MS Contin 60 mg needs PA. Forwarded request to [place], RN to process.  Dose was increased on 10/30/20 due to increased back pain.

## 2020-11-01 NOTE — Patient Instructions (Signed)

## 2020-11-06 ENCOUNTER — Telehealth: Payer: Self-pay | Admitting: *Deleted

## 2020-11-06 ENCOUNTER — Other Ambulatory Visit: Payer: Self-pay | Admitting: Nurse Practitioner

## 2020-11-06 DIAGNOSIS — C250 Malignant neoplasm of head of pancreas: Secondary | ICD-10-CM

## 2020-11-06 MED ORDER — CIPROFLOXACIN HCL 500 MG PO TABS: 500.0000 mg | ORAL_TABLET | Freq: Two times a day (BID) | ORAL | 0 refills | Status: AC

## 2020-11-06 NOTE — Telephone Encounter (Signed)
Reports having symptoms of UTI: urge to void with "dribbles" and burns to urinate. Asking if MD would call in antibiotic for her? Last UTI was put on Cipro.

## 2020-11-06 NOTE — Telephone Encounter (Signed)
Notified that Cipro was sent to [facility]. Call if no improvement in symptoms.

## 2020-11-07 NOTE — Progress Notes (Addendum)
11/01/2020 Connected with [employer] authorization team member.  Received "confirmation of initial prior authorization [id number] approved 10/11/2020 through 11/29/2021 for Morphine Sulfate Tab 60 mg ER to take as directed.   No prior further authorization activity required.  Allowed to take four tablets daily with current authorization.   Identified retail pharmacy receiving DUR code 84.  Unable to define meaning of code except this type of code should signal a call to prior [facility] help desk for help/assistance to 'walk through' processing order."   Connected with [facility] [phone]) with above information.  11/04/2020 faxed copy of [employer] authorization document in coverMyMeds to [facility] [phone]).

## 2020-11-11 ENCOUNTER — Telehealth: Payer: Self-pay | Admitting: Oncology

## 2020-11-11 NOTE — Telephone Encounter (Signed)
Pt will receive an updated appt calendar per next visit appt notes

## 2020-11-13 ENCOUNTER — Other Ambulatory Visit: Payer: Self-pay

## 2020-11-13 ENCOUNTER — Other Ambulatory Visit: Payer: Self-pay | Admitting: Nurse Practitioner

## 2020-11-13 ENCOUNTER — Other Ambulatory Visit: Payer: Self-pay | Admitting: Family Medicine

## 2020-11-13 DIAGNOSIS — C250 Malignant neoplasm of head of pancreas: Secondary | ICD-10-CM

## 2020-11-13 MED ORDER — PREDNISONE 50 MG PO TABS
ORAL_TABLET | ORAL | 1 refills | Status: DC
Start: 2020-11-13 — End: 2021-01-23

## 2020-11-14 ENCOUNTER — Other Ambulatory Visit: Payer: Self-pay

## 2020-11-14 ENCOUNTER — Ambulatory Visit (HOSPITAL_COMMUNITY)
Admission: RE | Admit: 2020-11-14 | Discharge: 2020-11-14 | Disposition: A | Discharge status: Home / Self Care | Payer: Medicare Other | Source: Ambulatory Visit | Attending: Nurse Practitioner | Admitting: Nurse Practitioner

## 2020-11-14 DIAGNOSIS — C250 Malignant neoplasm of head of pancreas: Secondary | ICD-10-CM

## 2020-11-14 MED ORDER — IOHEXOL 300 MG/ML  SOLN
100.0000 mL | Freq: Once | INTRAMUSCULAR | Status: AC | PRN
  Administered 2020-11-14: 100 mL via INTRAVENOUS

## 2020-11-14 MED ORDER — SODIUM CHLORIDE (PF) 0.9 % IJ SOLN
INTRAMUSCULAR | Status: AC
  Filled 2020-11-14: qty 50

## 2020-11-15 ENCOUNTER — Telehealth: Payer: Self-pay | Admitting: *Deleted

## 2020-11-15 ENCOUNTER — Other Ambulatory Visit: Payer: Self-pay | Admitting: Nurse Practitioner

## 2020-11-15 DIAGNOSIS — C250 Malignant neoplasm of head of pancreas: Secondary | ICD-10-CM

## 2020-11-15 MED ORDER — OXYCODONE-ACETAMINOPHEN 10-325 MG PO TABS: 1.0000 | ORAL_TABLET | ORAL | 0 refills | Status: DC | PRN

## 2020-11-15 NOTE — Telephone Encounter (Signed)
Called to request refill on her oxycodone-apap. NP notified.

## 2020-11-17 ENCOUNTER — Encounter: Payer: Self-pay | Admitting: Family Medicine

## 2020-11-18 ENCOUNTER — Encounter: Payer: Self-pay | Admitting: Family Medicine

## 2020-11-18 ENCOUNTER — Other Ambulatory Visit: Payer: Medicare Other

## 2020-11-18 ENCOUNTER — Telehealth (INDEPENDENT_AMBULATORY_CARE_PROVIDER_SITE_OTHER): Payer: Medicare Other | Admitting: Family Medicine

## 2020-11-18 ENCOUNTER — Ambulatory Visit: Payer: Medicare Other | Admitting: Oncology

## 2020-11-18 ENCOUNTER — Ambulatory Visit: Payer: Medicare Other

## 2020-11-18 DIAGNOSIS — M542 Cervicalgia: Secondary | ICD-10-CM | POA: Insufficient documentation

## 2020-11-18 DIAGNOSIS — R9389 Abnormal findings on diagnostic imaging of other specified body structures: Secondary | ICD-10-CM | POA: Insufficient documentation

## 2020-11-18 DIAGNOSIS — J411 Mucopurulent chronic bronchitis: Secondary | ICD-10-CM

## 2020-11-18 DIAGNOSIS — R197 Diarrhea, unspecified: Secondary | ICD-10-CM | POA: Insufficient documentation

## 2020-11-18 MED ORDER — AMOXICILLIN 875 MG PO TABS: 875.0000 mg | ORAL_TABLET | Freq: Two times a day (BID) | ORAL | 0 refills | Status: DC

## 2020-11-18 NOTE — Progress Notes (Signed)
I connected with  Vicki [name] on 11/18/20 by a video enabled telemedicine application and verified that I am speaking with the correct person using two identifiers.   I discussed the limitations of evaluation and management by telemedicine. The patient expressed understanding and agreed to proceed.

## 2020-11-18 NOTE — Progress Notes (Signed)
Virtual Visit via Video   I connected with patient on 11/18/20 at 12:30 PM EST by a video enabled telemedicine application and verified that I am speaking with the correct person using two identifiers.  Location patient: Home Location provider: [facility], Office Persons participating in the virtual visit: Patient, Provider, [place] (Sabrina M)  I discussed the limitations of evaluation and management by telemedicine and the availability of in person appointments. The patient expressed understanding and agreed to proceed.  Subjective:   HPI:   URI- 'I got a cold in my chest'  Has been around grandchildren.  No fever.  Cough is productive at times but right now feels tightness in chest.  sxs started Saturday.  Denies sore throat, HA.  + green/yellow drainage from nose.  Mild SOB at times.  Currently undergoing chemo.  ROS:   See pertinent positives and negatives per HPI.  Patient Active Problem List   Diagnosis Date Noted  . Neck pain 11/18/2020  . Diarrhea 11/18/2020  . Abnormal findings on diagnostic imaging of other specified body structures 11/18/2020  . Port-A-Cath in place 09/12/2020  . Abdominal pain 09/06/2020  . Obstruction of biliary stent 09/06/2020  . Genetic testing 04/03/2020  . Chemotherapy induced nausea and vomiting 04/01/2020  . Hypokalemia due to excessive gastrointestinal loss of potassium 04/01/2020  . Pancreatic cancer ([place]) 03/20/2020  . Goals of care, counseling/discussion 03/20/2020  . Family history of breast cancer   . Family history of cystic fibrosis   . Foot pain, right 11/25/2018  . Collagenous colitis 02/08/2018  . Family history of Alzheimer's disease 02/08/2018  . Psoriasis 02/12/2015  . 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 Bradycardia, symptomatic with fatigue 09/13/2013  . Low back pain 02/23/2013  . Coronary artery disease involving native coronary artery of native heart without angina pectoris 05/30/2012  .  Physical exam 05/04/2012  . Allergic rhinitis, seasonal 03/25/2012  . Hyperlipidemia 06/19/2010  . Essential hypertension 06/19/2010  . MYOCARDIAL INFARCTION, HX OF 06/19/2010    Social History   Tobacco Use  . Smoking status: Former Smoker    Quit date: 01/28/2010    Years since quitting: 10.8  . Smokeless tobacco: Never Used  Substance Use Topics  . Alcohol use: Yes    Alcohol/week: 1.0 - 2.0 standard drink    Types: 1 - 2 Glasses of wine per week    Current Outpatient Medications:  .  ALPRAZolam (XANAX) 0.25 MG tablet, TAKE 1 TABLET(0.25 MG) BY MOUTH AT BEDTIME, Disp: 30 tablet, Rfl: 3 .  aspirin 81 MG tablet, Take 81 mg by mouth at bedtime., Disp: , Rfl:  .  cetirizine (ZYRTEC) 10 MG tablet, Take 10 mg by mouth daily. , Disp: , Rfl:  .  Cholecalciferol (VITAMIN D3) 5000 units CAPS, Take 5,000 Units by mouth at bedtime. , Disp: , Rfl:  .  clopidogrel (PLAVIX) 75 MG tablet, Take 75 mg by mouth daily., Disp: , Rfl:  .  Cod Liver Oil 10 MINIM CAPS, 1 tablet, Disp: , Rfl:  .  Cod Liver Oil 5000-500 UNIT/5ML OIL, Take 1 capsule by mouth daily., Disp: , Rfl:  .  dexamethasone (DECADRON) 4 MG tablet, Take 2 tablets (8 mg total) by mouth 2 (two) times daily. X 3 days after each chemo. Start on day 2, Disp: 24 tablet, Rfl: 1 .  escitalopram (LEXAPRO) 10 MG tablet, TAKE 1 TABLET(10 MG) BY MOUTH DAILY (Patient taking differently: Take 10 mg by mouth daily.), Disp: 90 tablet,  Rfl: 0 .  gabapentin (NEURONTIN) 100 MG capsule, TAKE 2 CAPSULES(200 MG) BY MOUTH AT BEDTIME, Disp: 60 capsule, Rfl: 2 .  metoprolol tartrate (LOPRESSOR) 50 MG tablet, TAKE 1 TABLET(50 MG) BY MOUTH TWICE DAILY (Patient taking differently: Take 25 mg by mouth 2 (two) times daily.), Disp: 180 tablet, Rfl: 1 .  montelukast (SINGULAIR) 10 MG tablet, TAKE 1 TABLET(10 MG) BY MOUTH AT BEDTIME, Disp: 90 tablet, Rfl: 1 .  morphine (MS CONTIN) 60 MG 12 hr tablet, Take 1 tablet (60 mg total) by mouth every 12 (twelve) hours., Disp: 60  tablet, Rfl: 0 .  oxyCODONE-acetaminophen (PERCOCET) 10-325 MG tablet, Take 1 tablet by mouth every 4 (four) hours as needed for pain., Disp: 80 tablet, Rfl: 0 .  pantoprazole (PROTONIX) 40 MG tablet, Take 1 tablet (40 mg total) by mouth 2 (two) times daily., Disp: 180 tablet, Rfl: 3 .  polyethylene glycol (MIRALAX / GLYCOLAX) 17 g packet, Take 17 g by mouth daily as needed for mild constipation or moderate constipation. , Disp: , Rfl:  .  potassium chloride SA (KLOR-CON) 20 MEQ tablet, TAKE 1 TABLET(20 MEQ) BY MOUTH DAILY (Patient taking differently: Take 20 mEq by mouth daily.), Disp: 90 tablet, Rfl: 3 .  predniSONE (DELTASONE) 50 MG tablet, Take #1 tablet 13 hours, 7 hours and 1 hour prior to CT scan. Take Benadryl 50 mg with 1 hour dose, Disp: 3 tablet, Rfl: 1 .  rosuvastatin (CRESTOR) 10 MG tablet, TAKE 1 TABLET(10 MG) BY MOUTH DAILY (Patient taking differently: Take 10 mg by mouth daily.), Disp: 90 tablet, Rfl: 3 .  albuterol (PROVENTIL) (2.5 MG/3ML) 0.083% nebulizer solution, Take 2.5 mg by nebulization every 6 (six) hours as needed for wheezing or shortness of breath. (Patient not taking: Reported on 11/18/2020), Disp: , Rfl:  .  doxycycline (VIBRA-TABS) 100 MG tablet, Take 1 tablet (100 mg total) by mouth 2 (two) times daily. (Patient not taking: Reported on 11/18/2020), Disp: 20 tablet, Rfl: 0 .  guaiFENesin (MUCINEX) 600 MG 12 hr tablet, Take 600 mg by mouth 2 (two) times daily. (Patient not taking: Reported on 11/18/2020), Disp: , Rfl:  .  nitroGLYCERIN (NITROSTAT) 0.4 MG SL tablet, PLACE 1 TABLET UNDER THE TONGUE EVERY 5 MINUTES AS NEEDED FOR CHEST PAIN (Patient not taking: Reported on 11/18/2020), Disp: 25 tablet, Rfl: 1 .  ondansetron (ZOFRAN) 8 MG tablet, Take 1 tablet (8 mg total) by mouth every 8 (eight) hours as needed for nausea or vomiting. (Patient not taking: Reported on 11/18/2020), Disp: 60 tablet, Rfl: 1  Allergies  Allergen Reactions  . Chlorhexidine     Do not add, port  not accessed  . Contrast Media [Iodinated Diagnostic Agents] Hives    CT IV contrast- pt states she still has itching even with 4 hour and 13 hour preps 09/06/20    Objective:   There were no vitals taken for this visit. AAOx3, NAD NCAT, EOMI No obvious CN deficits Coloring WNL Pt is able to speak clearly, coherently without shortness of breath or increased work of breathing.  Thought process is linear.  Mood is appropriate.   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  Start Amoxicillin twice daily.  Reviewed supportive care and red flags that should prompt return.  Pt expressed understanding and is in agreement w/ plan.    Annye Asa, MD 11/18/2020

## 2020-11-25 ENCOUNTER — Inpatient Hospital Stay: Payer: Medicare Other

## 2020-11-25 ENCOUNTER — Inpatient Hospital Stay (HOSPITAL_BASED_OUTPATIENT_CLINIC_OR_DEPARTMENT_OTHER): Payer: Medicare Other | Admitting: Oncology

## 2020-11-25 ENCOUNTER — Other Ambulatory Visit: Payer: Self-pay

## 2020-11-25 VITALS — BP 118/63 | HR 58 | Temp 97.8°F | Resp 18 | Ht 66.0 in | Wt 122.9 lb

## 2020-11-25 DIAGNOSIS — C250 Malignant neoplasm of head of pancreas: Secondary | ICD-10-CM

## 2020-11-25 DIAGNOSIS — Z5111 Encounter for antineoplastic chemotherapy: Secondary | ICD-10-CM | POA: Diagnosis not present

## 2020-11-25 DIAGNOSIS — Z95828 Presence of other vascular implants and grafts: Secondary | ICD-10-CM

## 2020-11-25 LAB — CBC WITH DIFFERENTIAL (CANCER CENTER ONLY)
Abs Immature Granulocytes: 0.04 10*3/uL (ref 0.00–0.07)
Basophils Absolute: 0 10*3/uL (ref 0.0–0.1)
Basophils Relative: 0 %
Eosinophils Absolute: 0.3 10*3/uL (ref 0.0–0.5)
Eosinophils Relative: 3 %
HCT: 31.9 % — ABNORMAL LOW (ref 36.0–46.0)
Hemoglobin: 10.4 g/dL — ABNORMAL LOW (ref 12.0–15.0)
Immature Granulocytes: 0 %
Lymphocytes Relative: 15 %
Lymphs Abs: 1.5 10*3/uL (ref 0.7–4.0)
MCH: 29.1 pg (ref 26.0–34.0)
MCHC: 32.6 g/dL (ref 30.0–36.0)
MCV: 89.1 fL (ref 80.0–100.0)
Monocytes Absolute: 0.8 10*3/uL (ref 0.1–1.0)
Monocytes Relative: 8 %
Neutro Abs: 7.5 10*3/uL (ref 1.7–7.7)
Neutrophils Relative %: 74 %
Platelet Count: 368 10*3/uL (ref 150–400)
RBC: 3.58 MIL/uL — ABNORMAL LOW (ref 3.87–5.11)
RDW: 15.9 % — ABNORMAL HIGH (ref 11.5–15.5)
WBC Count: 10 10*3/uL (ref 4.0–10.5)
nRBC: 0 % (ref 0.0–0.2)

## 2020-11-25 LAB — CMP (CANCER CENTER ONLY)
ALT: 14 U/L (ref 0–44)
AST: 19 U/L (ref 15–41)
Albumin: 3 g/dL — ABNORMAL LOW (ref 3.5–5.0)
Alkaline Phosphatase: 188 U/L — ABNORMAL HIGH (ref 38–126)
Anion gap: 7 (ref 5–15)
BUN: 6 mg/dL — ABNORMAL LOW (ref 8–23)
CO2: 27 mmol/L (ref 22–32)
Calcium: 9.2 mg/dL (ref 8.9–10.3)
Chloride: 105 mmol/L (ref 98–111)
Creatinine: 0.63 mg/dL (ref 0.44–1.00)
GFR, Estimated: >60 mL/min (ref >60)
Glucose, Bld: 115 mg/dL — ABNORMAL HIGH (ref 70–99)
Potassium: 4.7 mmol/L (ref 3.5–5.1)
Sodium: 139 mmol/L (ref 135–145)
Total Bilirubin: 0.4 mg/dL (ref 0.3–1.2)
Total Protein: 6.8 g/dL (ref 6.5–8.1)

## 2020-11-25 MED ORDER — ATROPINE SULFATE 1 MG/ML IJ SOLN
INTRAMUSCULAR | Status: AC
  Filled 2020-11-25: qty 1

## 2020-11-25 MED ORDER — ATROPINE SULFATE 1 MG/ML IJ SOLN
0.4000 mg | Freq: Once | INTRAMUSCULAR | Status: AC
  Administered 2020-11-25: 0.4 mg via INTRAVENOUS

## 2020-11-25 MED ORDER — SODIUM CHLORIDE 0.9% FLUSH
10.0000 mL | Freq: Once | INTRAVENOUS | Status: AC
  Administered 2020-11-25: 10 mL
  Filled 2020-11-25: qty 10

## 2020-11-25 MED ORDER — DEXAMETHASONE SODIUM PHOSPHATE 100 MG/10ML IJ SOLN
10.0000 mg | Freq: Once | INTRAMUSCULAR | Status: AC
  Administered 2020-11-25: 10 mg via INTRAVENOUS
  Filled 2020-11-25: qty 10

## 2020-11-25 MED ORDER — SODIUM CHLORIDE 0.9 % IV SOLN
2000.0000 mg/m2 | INTRAVENOUS | Status: DC
  Administered 2020-11-25: 3200 mg via INTRAVENOUS
  Filled 2020-11-25: qty 64

## 2020-11-25 MED ORDER — LORAZEPAM 2 MG/ML IJ SOLN
0.5000 mg | Freq: Once | INTRAMUSCULAR | Status: AC
  Administered 2020-11-25: 0.5 mg via INTRAVENOUS

## 2020-11-25 MED ORDER — SODIUM CHLORIDE 0.9 % IV SOLN
INTRAVENOUS | Status: DC
  Filled 2020-11-25: qty 250

## 2020-11-25 MED ORDER — MORPHINE SULFATE ER 60 MG PO TBCR: 60.0000 mg | EXTENDED_RELEASE_TABLET | Freq: Three times a day (TID) | ORAL | 0 refills | Status: DC

## 2020-11-25 MED ORDER — SODIUM CHLORIDE 0.9 % IV SOLN
400.0000 mg/m2 | Freq: Once | INTRAVENOUS | Status: AC
  Administered 2020-11-25: 644 mg via INTRAVENOUS
  Filled 2020-11-25: qty 32.2

## 2020-11-25 MED ORDER — FOSAPREPITANT DIMEGLUMINE INJECTION 150 MG
150.0000 mg | Freq: Once | INTRAVENOUS | Status: AC
  Administered 2020-11-25: 150 mg via INTRAVENOUS
  Filled 2020-11-25: qty 150

## 2020-11-25 MED ORDER — LORAZEPAM 2 MG/ML IJ SOLN
INTRAMUSCULAR | Status: AC
  Filled 2020-11-25: qty 1

## 2020-11-25 MED ORDER — SODIUM CHLORIDE 0.9 % IV SOLN
150.0000 mg/m2 | Freq: Once | INTRAVENOUS | Status: AC
  Administered 2020-11-25: 240 mg via INTRAVENOUS
  Filled 2020-11-25: qty 12

## 2020-11-25 MED ORDER — ATROPINE SULFATE 0.4 MG/ML IJ SOLN
INTRAMUSCULAR | Status: AC
  Filled 2020-11-25: qty 1

## 2020-11-25 MED ORDER — SODIUM CHLORIDE 0.9 % IV SOLN
16.0000 mg | Freq: Once | INTRAVENOUS | Status: AC
  Administered 2020-11-25: 16 mg via INTRAVENOUS
  Filled 2020-11-25: qty 8

## 2020-11-25 MED ORDER — OXYCODONE-ACETAMINOPHEN 10-325 MG PO TABS: 1.0000 | ORAL_TABLET | ORAL | 0 refills | Status: DC | PRN

## 2020-11-25 NOTE — Progress Notes (Signed)
[facility] OFFICE PROGRESS NOTE   Diagnosis: Pancreas cancer  INTERVAL HISTORY:   Vicki [name] returns as scheduled.  She continues to have back pain.  She increase the MS Contin to 3 times daily and this has helped.  She has frequent belching.  No nausea or vomiting.  She takes approximately 4 oxycodone tablets per day for breakthrough pain. No change in neuropathy symptoms.  Her brother recently died of Covid 39 infection.  She relates weight loss to a grief reaction.  Objective:  Vital signs in last 24 hours:  Blood pressure 118/63, pulse (!) 58, temperature 97.8 F (36.6 C), temperature source Tympanic, resp. rate 18, height 5\' 6"  (1.676 m), weight 122 lb 14.4 oz (55.7 kg), SpO2 100 %.    HEENT: No thrush or ulcers Resp: Scattered inspiratory wheeze and rhonchi, no respiratory distress Cardio: Regular rate and rhythm GI: Nontender, no hepatomegaly, no mass Vascular: No leg edema  Skin: Palms without erythema  Portacath/PICC-without erythema  Lab Results:  Lab Results  Component Value Date   WBC 10.0 11/25/2020   HGB 10.4 (L) 11/25/2020   HCT 31.9 (L) 11/25/2020   MCV 89.1 11/25/2020   PLT 368 11/25/2020   NEUTROABS 7.5 11/25/2020    CMP  Lab Results  Component Value Date   NA 138 10/30/2020   K 4.5 10/30/2020   CL 108 10/30/2020   CO2 23 10/30/2020   GLUCOSE 94 10/30/2020   BUN 11 10/30/2020   CREATININE 0.62 10/30/2020   CALCIUM 8.9 10/30/2020   PROT 6.2 (L) 10/30/2020   ALBUMIN 3.1 (L) 10/30/2020   AST 16 10/30/2020   ALT 7 10/30/2020   ALKPHOS 132 (H) 10/30/2020   BILITOT 0.3 10/30/2020   GFRNONAA >60 10/30/2020   GFRAA >60 08/19/2020    Medications: I have reviewed the patient's current medications.   Assessment/Plan: 1. 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 MRI abdomen 02/01/2020-2.2 x 3.3 cm mass in the posterior pancreas head/uncinate process, mild intrahepatic/extrahepatic ductal  dilatation, no evidence of vascular invasion, possible small lymph nodes in the porta hepatis-poorly visualized ? EUS 02/29/2020-20 x 23 mm mass in the pancreas head, upstream pancreatic duct dilatation, 1 abnormal peripancreatic node, uT3?uN1 ? 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 CTs 03/19/2020-2.9 x 2.1 x 3.1 cm pancreas head/uncinate mass, lesion associated with infrarenal abdominal aorta and superior mesenteric artery, prominent and borderline enlarged retroperitoneal nodes including a left periaortic node, indeterminate 4 mm lung nodule ? Cycle 1 mFOLFIRINOX 03/28/20, complicated by n/v and hospital admission 5/2/ - 5/3 ? Second opinion at [facility] with Dr[facility] and Dr. Cooper Render 04/02/20 ? PET scan 04/03/2020-hypermetabolic poorly marginated pancreatic head mass; 2 hypermetabolic left periaortic lymph nodes, hypermetabolic left supraclavicular lymph node ? Cycle 2 FOLFIRINOX 04/11/2020 ? Ultrasound-guided biopsy of a left supraclavicular mass on 04/18/2020-poorly differentiated adenocarcinoma, cytokeratin 7+, cytokeratin 20 and CDX2 positive in rare cells. ? Cycle 3 FOLFIRINOX 04/24/2020-oxaliplatin infusion lengthened to 3 hours, Udenyca was not given (ordered) ? Cycle 4 FOLFIRINOX 05/08/2020 ? Cycle 5 FOLFIRINOX 05/22/2020-atropine and Ativan premedication added secondary to cholinergic symptoms and nausea from irinotecan ? CTs 05/29/2020-decreased left supraclavicular lymph node, slight decrease in size of the pancreas head mass, decrease in retroperitoneal lymphadenopathy ? Cycle 6 FOLFIRINOX 06/05/2020 ? Cycle 7 FOLFIRINOX 06/26/2020 ? Cycle 8 FOLFIRINOX 07/11/2020 (oxaliplatin held secondary to neuropathy) ? Cycle 9 FOLFIRINOX 07/24/2020 (oxaliplatin held secondary to neuropathy) ? Cycle 10 FOLFIRINOX 08/07/2020 (oxaliplatin held  secondary to neuropathy) ? CTs 08/19/2020-subtle 5 mm peripheral right liver lesion, pancreas mass unchanged and difficult to discretely  delineate, unchanged left retroperitoneal node, no evidence of disease progression, onDr. Scherrie Seneca'sreview of the CTs there is no change in the previously enlarged left supraclavicular node ? Cycle 11 FOLFIRINOX 09/18/2020 (oxaliplatin held secondary to neuropathy) ? Cycle 12 FOLFIRINOX 10/08/2020 (oxaliplatin held secondary to neuropathy) ? Cycle 13 FOLFIRINOX 10/30/2020 (oxaliplatin held secondary to neuropathy) ? CTs 11/14/2020-no evidence of thoracic metastases, slight enlargement of the pancreas head mass, increase in size of periaortic lymph node ? Cycle 14 FOLFIRINOX 11/25/2020 (oxaliplatin held secondary to neuropathy)  2. Abdomen/back pain secondary to #1 3. Anorexia/weight loss secondary to #1 4. History of coronary artery disease,STEMI with the fib arrest 2011, status post RCA stent 5. Constipation, secondary to #1 and morphine 6. 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 Metal stent placed into the common bile duct 09/06/2020.    Disposition: Vicki [name] appears unchanged.  She continues to have pain, chiefly in the back.  The pain is likely related to the locally advanced pancreas tumor.  She will continue MS Contin and oxycodone on the current schedule.  I refilled the narcotics today.  I will refer her to interventional radiology to consider a celiac block.  We reviewed the CT images and discussed treatment options.  There has been slight enlargement of the pancreas mass and a periaortic node compared to the last CTs.  There are no apparent new sites of metastatic disease.  The previously noted supraclavicular node is not well evaluated on the current CT.  We  decided to continue FOLFIRI for now.  She will complete another treatment today.  She will return for an office visit and chemotherapy in 2 weeks.  She is scheduled follow-up at [facility] in February.  We will consider changing to gemcitabine/Abraxane when there is clear evidence of disease progression.    Thornton Papas, MD  11/25/2020  10:19 AM

## 2020-11-25 NOTE — Patient Instructions (Signed)
[facility] Discharge Instructions for Patients Receiving Chemotherapy  Today you received the following chemotherapy agents: irinotecan/leucovorin/fluorouracil.  To help prevent nausea and vomiting after your treatment, we encourage you to take your nausea medication as directed.   If you develop nausea and vomiting that is not controlled by your nausea medication, call the clinic.   BELOW ARE SYMPTOMS THAT SHOULD BE REPORTED IMMEDIATELY:  *FEVER GREATER THAN 100.5 F  *CHILLS WITH OR WITHOUT FEVER  NAUSEA AND VOMITING THAT IS NOT CONTROLLED WITH YOUR NAUSEA MEDICATION  *UNUSUAL SHORTNESS OF BREATH  *UNUSUAL BRUISING OR BLEEDING  TENDERNESS IN MOUTH AND THROAT WITH OR WITHOUT PRESENCE OF ULCERS  *URINARY PROBLEMS  *BOWEL PROBLEMS  UNUSUAL RASH Items with * indicate a potential emergency and should be followed up as soon as possible.  Feel free to call the clinic should you have any questions or concerns. The clinic phone number is [phone].  Please show the CHEMO ALERT CARD at check-in to the Emergency Department and triage nurse.  

## 2020-11-26 ENCOUNTER — Other Ambulatory Visit: Payer: Self-pay | Admitting: Oncology

## 2020-11-26 ENCOUNTER — Encounter (HOSPITAL_COMMUNITY): Payer: Self-pay | Admitting: Radiology

## 2020-11-26 ENCOUNTER — Telehealth: Payer: Self-pay | Admitting: Oncology

## 2020-11-26 DIAGNOSIS — C250 Malignant neoplasm of head of pancreas: Secondary | ICD-10-CM

## 2020-11-26 LAB — CANCER ANTIGEN 19-9: CA 19-9: 48 U/mL — ABNORMAL HIGH (ref 0–35)

## 2020-11-26 NOTE — Progress Notes (Signed)
Vicki [name] Female, 64 y.o., 25-Oct-1956  MRN:  [id number] Phone:  [phone] [name])       PCP:  Sheliah Hatch, MD Primary Cvg:  Medicare/Medicare Part A And B  Next Appt With Oncology 11/27/2020 at 12:30 PM           RE: CT Biopsy Received: Today Suttle, Thressa Sheller, MD  [name]  Recommend IR clinic referral.   [name]        Previous Messages   ----- Message -----  From: [name]  Sent: 11/26/2020  2:12 PM EST  To: Ir Procedure Requests  Subject: CT Biopsy                     Procedure: CT Biopsy   Reason:  Malignant neoplasm of head of pancreas, Pancreas cancer, severe back pain,  Please schedule celiac nerve block for patient with pain related to locally advanced pancreas cancer   History:  CT, NM, DG, [place] BX in computer   Provider: Ladene Artist   Provider Contact: [phone]

## 2020-11-26 NOTE — Telephone Encounter (Signed)
Scheduled appointments per 12/27 los. Spoke to patient who is aware of appointments dates and times.  

## 2020-11-26 NOTE — Progress Notes (Signed)
Vicki [name] Female, 64 y.o., 29-Nov-1956  MRN:  [id number] Phone:  [phone] [name])       PCP:  Sheliah Hatch, MD Primary Cvg:  Medicare/Medicare Part A And B  Next Appt With Oncology 11/27/2020 at 12:30 PM           RE: CT Biopsy Received: Today [name], MD  [name]  Yes, that's fine with me.   JJB        Previous Messages   ----- Message -----  From: [name]  Sent: 11/26/2020  2:25 PM EST  To: [name], MD  Subject: FW: CT Biopsy                   Good afternoon [name], Dr. Truett Perna is wanting patient to have a CT Biopsy done and I just need to know if it is okay for her to hold her Plavix for 5 days prior to her Biopsy. Thanks [name]  ----- Message -----  From: [name]  Sent: 11/26/2020  2:12 PM EST  To: Ir Procedure Requests  Subject: CT Biopsy                     Procedure: CT Biopsy   Reason:  Malignant neoplasm of head of pancreas, Pancreas cancer, severe back pain,  Please schedule celiac nerve block for patient with pain related to locally advanced pancreas cancer   History:  CT, NM, DG, [place] BX in computer   Provider: Ladene Artist   Provider Contact: [phone]

## 2020-11-27 ENCOUNTER — Ambulatory Visit
Admission: RE | Admit: 2020-11-27 | Discharge: 2020-11-27 | Disposition: A | Discharge status: Home / Self Care | Payer: Medicare Other | Source: Ambulatory Visit | Attending: Oncology | Admitting: Oncology

## 2020-11-27 ENCOUNTER — Other Ambulatory Visit: Payer: Self-pay | Admitting: Oncology

## 2020-11-27 ENCOUNTER — Inpatient Hospital Stay: Payer: Medicare Other

## 2020-11-27 ENCOUNTER — Other Ambulatory Visit: Payer: Self-pay

## 2020-11-27 VITALS — BP 131/76 | Temp 98.2°F | Resp 56

## 2020-11-27 DIAGNOSIS — C250 Malignant neoplasm of head of pancreas: Secondary | ICD-10-CM

## 2020-11-27 DIAGNOSIS — C251 Malignant neoplasm of body of pancreas: Secondary | ICD-10-CM

## 2020-11-27 DIAGNOSIS — Z5111 Encounter for antineoplastic chemotherapy: Secondary | ICD-10-CM | POA: Diagnosis not present

## 2020-11-27 DIAGNOSIS — Z95828 Presence of other vascular implants and grafts: Secondary | ICD-10-CM

## 2020-11-27 MED ORDER — SODIUM CHLORIDE 0.9% FLUSH
10.0000 mL | Freq: Once | INTRAVENOUS | Status: AC
Start: 2020-11-27 — End: 2020-11-27
  Administered 2020-11-27: 10 mL
  Filled 2020-11-27: qty 10

## 2020-11-27 MED ORDER — HEPARIN SOD (PORK) LOCK FLUSH 100 UNIT/ML IV SOLN
500.0000 [IU] | Freq: Once | INTRAVENOUS | Status: AC
  Administered 2020-11-27: 500 [IU]
  Filled 2020-11-27: qty 5

## 2020-11-27 MED ORDER — PEGFILGRASTIM-BMEZ 6 MG/0.6ML ~~LOC~~ SOSY
6.0000 mg | PREFILLED_SYRINGE | Freq: Once | SUBCUTANEOUS | Status: AC
Start: 2020-11-27 — End: 2020-11-27
  Administered 2020-11-27: 6 mg via SUBCUTANEOUS

## 2020-11-27 MED ORDER — PEGFILGRASTIM-BMEZ 6 MG/0.6ML ~~LOC~~ SOSY
PREFILLED_SYRINGE | SUBCUTANEOUS | Status: AC
  Filled 2020-11-27: qty 0.6

## 2020-11-27 NOTE — Patient Instructions (Signed)

## 2020-11-28 ENCOUNTER — Telehealth: Payer: Medicare Other

## 2020-12-01 ENCOUNTER — Other Ambulatory Visit: Payer: Self-pay | Admitting: Cardiovascular Disease

## 2020-12-02 ENCOUNTER — Telehealth: Payer: Self-pay | Admitting: *Deleted

## 2020-12-02 NOTE — Telephone Encounter (Signed)
Asking why [facility] called her for a virtual appointment? Informed her it was to discuss the celiac block Dr. Truett Perna discussed w/her for pain. She will try to reach out to them tomorrow.

## 2020-12-13 ENCOUNTER — Telehealth: Payer: Self-pay | Admitting: *Deleted

## 2020-12-13 NOTE — Telephone Encounter (Signed)
Reports significant pain over last 2 days in mid-back and lower stomach area. Rated "10" yesterday and "9" today. No N/V or fever and having bowel movements. Takes MS Contin 60 mg every 8 hours and Oxycodone/apap 10/325 #1 every 4 hours. What else can she do? Per Dr. Benay Spice: Increase am dose of MS Contin to 120 mg and stay with 60 mg with the other times. May increase the oxy/apap to #2 every 4 hours and increase to #3 every 4 hours if that does not work. Go to ER for N/V or fever of it pain can't be controlled on this regimen. She agrees and will call with update on Monday morning.

## 2020-12-16 ENCOUNTER — Other Ambulatory Visit: Payer: Self-pay | Admitting: Oncology

## 2020-12-16 ENCOUNTER — Inpatient Hospital Stay: Payer: Medicare Other | Attending: Oncology | Admitting: Oncology

## 2020-12-16 ENCOUNTER — Telehealth: Payer: Self-pay

## 2020-12-16 DIAGNOSIS — C250 Malignant neoplasm of head of pancreas: Secondary | ICD-10-CM

## 2020-12-16 DIAGNOSIS — R112 Nausea with vomiting, unspecified: Secondary | ICD-10-CM | POA: Insufficient documentation

## 2020-12-16 NOTE — Progress Notes (Signed)
[facility] OFFICE VISIT PROGRESS NOTE  I connected with Vicki [name] on 12/16/20 at  2:00 PM EST by telephone and verified that I am speaking with the correct person using two identifiers.   I discussed the limitations, risks, security and privacy concerns of performing an evaluation and management service by telemedicine and the availability of in-person appointments. I also discussed with the patient that there may be a patient responsible charge related to this service. The patient expressed understanding and agreed to proceed.    Patient's location: Home Provider's location: Home    Diagnosis: Pancreas cancer  INTERVAL HISTORY:   Vicki [name] is seen today for a telehealth visit per her request.  She completed another cycle of FOLFIRI on 11/25/2020.  She reports nausea and vomiting lasting for 2 weeks following chemotherapy.  She has lost a significant amount of weight over the past few weeks.  The nausea has now resolved.  No diarrhea.  Persistent neuropathy symptoms in the hands and feet.  She complains of increased abdomen and back pain.  She called on 12/13/2020.  The MS Contin was increased to 120 mg in the morning, 60 mg in the afternoon, and 60 mg at night.  She is taking 2 of the 10/325 oxycodones every 4 hours.  She reports the pain is relieved for approximately 2 hours after taking oxycodone and then returns.   Vicki [name] is scheduled for an appointment in interventional radiology tomorrow to consider a celiac block.  She is having bowel movements.   Medications: I have reviewed the patient's current medications.  Assessment/Plan: 1. 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 MRI abdomen 02/01/2020-2.2 x 3.3 cm mass in the posterior pancreas head/uncinate process, mild intrahepatic/extrahepatic ductal dilatation, no evidence of vascular invasion, possible small lymph nodes in the porta  hepatis-poorly visualized ? EUS 02/29/2020-20 x 23 mm mass in the pancreas head, upstream pancreatic duct dilatation, 1 abnormal peripancreatic node, uT3?uN1 ? 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 CTs 03/19/2020-2.9 x 2.1 x 3.1 cm pancreas head/uncinate mass, lesion associated with infrarenal abdominal aorta and superior mesenteric artery, prominent and borderline enlarged retroperitoneal nodes including a left periaortic node, indeterminate 4 mm lung nodule ? Cycle 1 mFOLFIRINOX 3/32/95, complicated by n/v and hospital admission 5/2/ - 5/3 ? Second opinion at [facility] with Dr. Earnestine Mealing and Dr. Mariah Milling 04/02/20 ? PET scan 04/03/2020-hypermetabolic poorly marginated pancreatic head mass; 2 hypermetabolic left periaortic lymph nodes, hypermetabolic left supraclavicular lymph node ? Cycle 2 FOLFIRINOX 04/11/2020 ? Ultrasound-guided biopsy of a left supraclavicular mass on 04/18/2020-poorly differentiated adenocarcinoma, cytokeratin 7+, cytokeratin 20 and CDX2 positive in rare cells. ? Cycle 3 FOLFIRINOX 04/24/2020-oxaliplatin infusion lengthened to 3 hours, Udenyca was not given (ordered) ? Cycle 4 FOLFIRINOX 05/08/2020 ? Cycle 5 FOLFIRINOX 05/22/2020-atropine and Ativan premedication added secondary to cholinergic symptoms and nausea from irinotecan ? CTs 05/29/2020-decreased left supraclavicular lymph node, slight decrease in size of the pancreas head mass, decrease in retroperitoneal lymphadenopathy ? Cycle 6 FOLFIRINOX 06/05/2020 ? Cycle 7 FOLFIRINOX 06/26/2020 ? Cycle 8 FOLFIRINOX 07/11/2020 (oxaliplatin held secondary to neuropathy) ? Cycle 9 FOLFIRINOX 07/24/2020 (oxaliplatin held secondary to neuropathy) ? Cycle 10 FOLFIRINOX 08/07/2020 (oxaliplatin held secondary to neuropathy) ? CTs 08/19/2020-subtle 5 mm peripheral right liver lesion, pancreas mass unchanged and difficult to discretely delineate, unchanged left retroperitoneal node, no evidence of disease progression, onDr.  Elzina Devera'sreview of the CTs there is no change in the previously  enlarged left supraclavicular node ? Cycle 11 FOLFIRINOX 09/18/2020 (oxaliplatin held secondary to neuropathy) ? Cycle 12 FOLFIRINOX 10/08/2020 (oxaliplatin held secondary to neuropathy) ? Cycle 13 FOLFIRINOX 10/30/2020 (oxaliplatin held secondary to neuropathy) ? CTs 11/14/2020-no evidence of thoracic metastases, slight enlargement of the pancreas head mass, increase in size of periaortic lymph node ? Cycle 14 FOLFIRINOX 11/25/2020 (oxaliplatin held secondary to neuropathy)  2. Abdomen/back pain secondary to #1-progressive January 2022 3. Anorexia/weight loss secondary to #1 4. History of coronary artery disease,STEMI with the fib arrest 2011, status post RCA stent 5. Constipation, secondary to #1 and morphine 6. 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 Metal stent placed into the common bile duct 09/06/2020.    Disposition: Vicki [name] has a history of metastatic pancreas cancer.  She was last treated with FOLFIRI chemotherapy on 11/25/2020.  She is symptomatic with increased pain.  We adjusted the narcotic regimen again today.  She will increase the MS Contin 120 mg in the morning, 60 mg in the afternoon, and 120 mg at night.  She will increase the 10-3 25 oxycodone to, 3 every 4 hours as needed.  Vicki [name] will see interventional radiology tomorrow.  She will return for an office visit as scheduled on 12/18/2020   I discussed the assessment and treatment plan with the patient. The patient was provided an opportunity to ask questions and all were answered. The patient agreed with the plan and demonstrated an  understanding of the instructions.   The patient was advised to call back or seek an in-person evaluation if the symptoms worsen or if the condition fails to improve as anticipated.  I provided 20 minutes of chart review, telephone, and documentation time during this encounter, and > 50% was spent counseling as documented under my assessment & plan.  Betsy Coder ANP/GNP-BC   12/16/2020 2:14 PM

## 2020-12-16 NOTE — Telephone Encounter (Signed)
Pt called in to give update on pain control. Pt stated she was taking (per Dr. Gearldine Shown directive on 12/14/19) MS Contin 120 mg in the am and staying with 60 mg with the other times. Had increased the oxy/apap to #2 every 4 hours and did not increase it to #3 every 4 hours . Pt was stating she had better control but the last 30 minutes of her of her oxy/app 2 po q 4 hours, her pain significantly more. Pt had not tried increasing dose of oxy/app to 3 tabs every 4 hours if needed. Pt stated she would try this. Pt instructed to call office or go to ER for N/V or fever or if pain can't be controlled on this regimen. She agrees and will call with update again on Tuesday morning.

## 2020-12-17 ENCOUNTER — Telehealth: Payer: Self-pay

## 2020-12-17 ENCOUNTER — Ambulatory Visit
Admission: RE | Admit: 2020-12-17 | Discharge: 2020-12-17 | Disposition: A | Discharge status: Home / Self Care | Payer: Medicare Other | Source: Ambulatory Visit | Attending: Oncology | Admitting: Oncology

## 2020-12-17 ENCOUNTER — Other Ambulatory Visit: Payer: Self-pay | Admitting: Nurse Practitioner

## 2020-12-17 ENCOUNTER — Other Ambulatory Visit: Payer: Self-pay

## 2020-12-17 ENCOUNTER — Encounter: Payer: Self-pay | Admitting: *Deleted

## 2020-12-17 DIAGNOSIS — C250 Malignant neoplasm of head of pancreas: Secondary | ICD-10-CM

## 2020-12-17 HISTORY — PX: IR RADIOLOGIST EVAL & MGMT: IMG5224

## 2020-12-17 MED ORDER — OXYCODONE HCL 10 MG PO TABS: 10.0000 mg | ORAL_TABLET | ORAL | 0 refills | Status: DC | PRN

## 2020-12-17 MED ORDER — MORPHINE SULFATE ER 100 MG PO TBCR: 100.0000 mg | EXTENDED_RELEASE_TABLET | Freq: Three times a day (TID) | ORAL | 0 refills | Status: DC

## 2020-12-17 MED FILL — Dexamethasone Sodium Phosphate Inj 100 MG/10ML: INTRAMUSCULAR | Qty: 1 | Status: AC

## 2020-12-17 MED FILL — Fosaprepitant Dimeglumine For IV Infusion 150 MG (Base Eq): INTRAVENOUS | Qty: 5 | Status: AC

## 2020-12-17 NOTE — Consult Note (Signed)
Chief Complaint: Patient was consulted remotely today (TeleHealth) for celiac plexus block at the request of [name],Vicki B.    Referring Physician(s): [name],Vicki B  History of Present Illness: Vicki [name] is a 65 y.o. female with a history of metastatic pancreatic carcinoma. She is known to me from having placed her port on 03/26/2020 and additionally had a left supraclavicular LN biopsy by our service on 04/18/2020.  Oncologic history: 1. 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 MRI abdomen 02/01/2020-2.2 x 3.3 cm mass in the posterior pancreas head/uncinate process, mild intrahepatic/extrahepatic ductal dilatation, no evidence of vascular invasion, possible small lymph nodes in the porta hepatis-poorly visualized ? EUS 02/29/2020-20 x 23 mm mass in the pancreas head, upstream pancreatic duct dilatation, 1 abnormal peripancreatic node, uT3?uN1 ? 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 CTs 03/19/2020-2.9 x 2.1 x 3.1 cm pancreas head/uncinate mass, lesion associated with infrarenal abdominal aorta and superior mesenteric artery, prominent and borderline enlarged retroperitoneal nodes including a left periaortic node, indeterminate 4 mm lung nodule ? Cycle 1 mFOLFIRINOX 1/60/10, complicated by n/v and hospital admission 5/2/ - 5/3 ? Second opinion at [facility] with Dr. Earnestine Mealing and Dr. Mariah Milling 04/02/20 ? PET scan 04/03/2020-hypermetabolic poorly marginated pancreatic head mass; 2 hypermetabolic left periaortic lymph nodes, hypermetabolic left supraclavicular lymph node ? Cycle 2 FOLFIRINOX 04/11/2020 ? Ultrasound-guided biopsy of a left supraclavicular mass on 04/18/2020-poorly differentiated adenocarcinoma, cytokeratin 7+, cytokeratin 20 and CDX2 positive in rare cells. ? Cycle 3 FOLFIRINOX 04/24/2020-oxaliplatin infusion lengthened to 3 hours, Udenyca was not given (ordered) ? Cycle 4 FOLFIRINOX 05/08/2020 ? Cycle 5 FOLFIRINOX  05/22/2020-atropine and Ativan premedication added secondary to cholinergic symptoms and nausea from irinotecan ? CTs 05/29/2020-decreased left supraclavicular lymph node, slight decrease in size of the pancreas head mass, decrease in retroperitoneal lymphadenopathy ? Cycle 6 FOLFIRINOX 06/05/2020 ? Cycle 7 FOLFIRINOX 06/26/2020 ? Cycle 8 FOLFIRINOX 07/11/2020 (oxaliplatin held secondary to neuropathy) ? Cycle 9 FOLFIRINOX 07/24/2020 (oxaliplatin held secondary to neuropathy) ? Cycle 10 FOLFIRINOX 08/07/2020 (oxaliplatin held secondary to neuropathy) ? CTs 08/19/2020-subtle 5 mm peripheral right liver lesion, pancreas mass unchanged and difficult to discretely delineate, unchanged left retroperitoneal node, no evidence of disease progression, onDr. Sherrill'sreview of the CTs there is no change in the previously enlarged left supraclavicular node ? Cycle 11 FOLFIRINOX 09/18/2020 (oxaliplatin held secondary to neuropathy) ? Cycle 12 FOLFIRINOX 10/08/2020 (oxaliplatin held secondary to neuropathy) ? Cycle 13 FOLFIRINOX 10/30/2020 (oxaliplatin held secondary to neuropathy) ? CTs 11/14/2020-no evidence of thoracic metastases, slight enlargement of the pancreas head mass, increase in size of periaortic lymph node ? Cycle 14 FOLFIRINOX 11/25/2020 (oxaliplatin held secondary to neuropathy)  2. Abdomen/back pain secondary to #1-progressive January 2022 3. Anorexia/weight loss secondary to #1 4. History of coronary artery disease,STEMI with the fib arrest 2011, status post RCA stent 5. Constipation, secondary to #1 and morphine 6. 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 Metal stent placed into  the common bile duct 09/06/2020.  [name] had a difficult time with her last cycle of FOLFIRI on 11/25/2020 with nausea and vomiting and has had progressive weight loss.  She weighed 170 pounds in April of last year and now weighs 114 pounds.  She is scheduled  for another cycle of chemotherapy tomorrow.  [name] has significant abdominal and back pain which she describes as constant and variable from epigastric pain to mid back pain.  The patient is described as a constant aching.  There are no significant relieving factors and she has trouble sitting still and walking due to pain.  The pain is sometimes worse with eating.  Increase in pain has required a change in her medication regimen. MS Contin was increased to 120 mg in the morning, 60 mg in the afternoon, and 60 mg at night on 12/13/2020. She was taking 2 of the 10/325 oxycodones every 4 hours.  Yesterday, Dr. Benay Spice instructed her to increase the MS Contin to 120 mg in the morning, 60 mg in the afternoon, and 120 mg at night.  She also increased the 10-325 oxycodone to 3 every 4 hours as needed.  Since increasing her pain medicine, she has noticed some increased relief this week with some better pain relief lasting for a few hours at a time.  This is symptoms does think that her weight loss and potentially pain were exacerbated by the death of her brother from [place] in December.  She has been actively trying to supplement her nutrition with additional supplements in the last couple weeks to try to gain back more weight.  Past Medical History:  Diagnosis Date  . CAD (coronary artery disease) 2011   with stent to RCA and 60 % LAD disease  . Clotting disorder ([place])   . Family history of breast cancer   . Family history of cystic fibrosis   . H/O cardiac arrest 02/15/10   with STEMI- Inf wall  . Hyperlipidemia   . Hypertension   . Myocardial infarct ([place]) 02/15/10  . pancreatic ca dx'd 02/2020  . Shock, cardiogenic ([place]) 01/2010   with MI,  IABP    Past Surgical History:  Procedure Laterality Date  . ABDOMINAL HYSTERECTOMY  1987   BSO  . APPENDECTOMY  1987  . BILIARY STENT PLACEMENT N/A 03/05/2020   Procedure: BILIARY STENT PLACEMENT;  Surgeon: Clarene Essex, MD;  Location: [facility];  Service: Endoscopy;  Laterality: N/A;  . BILIARY STENT PLACEMENT N/A 09/06/2020   Procedure: BILIARY STENT PLACEMENT;  Surgeon: Clarene Essex, MD;  Location: [facility];  Service: Endoscopy;  Laterality: N/A;  . CORONARY ANGIOPLASTY WITH STENT PLACEMENT  02/15/10   Stent to prox RCA -BMS  . DOPPLER ECHOCARDIOGRAPHY  05/06/2010   EF =50-55%  lvfx low normal ;no sigificant valvular disease seen  . ENDOSCOPIC RETROGRADE CHOLANGIOPANCREATOGRAPHY (ERCP) WITH PROPOFOL N/A 02/29/2020   Procedure: ENDOSCOPIC RETROGRADE CHOLANGIOPANCREATOGRAPHY (ERCP) WITH PROPOFOL;  Surgeon: Arta Silence, MD;  Location: [facility];  Service: Endoscopy;  Laterality: N/A;  . ERCP N/A 03/05/2020   Procedure: ENDOSCOPIC RETROGRADE CHOLANGIOPANCREATOGRAPHY (ERCP);  Surgeon: Clarene Essex, MD;  Location: [facility];  Service: Endoscopy;  Laterality: N/A;  with stent placement  . ERCP N/A 09/06/2020   Procedure: ENDOSCOPIC RETROGRADE CHOLANGIOPANCREATOGRAPHY (ERCP);  Surgeon: Clarene Essex, MD;  Location: [facility];  Service: Endoscopy;  Laterality: N/A;  . ESOPHAGOGASTRODUODENOSCOPY (EGD) WITH PROPOFOL N/A 02/29/2020   Procedure: ESOPHAGOGASTRODUODENOSCOPY (EGD) WITH PROPOFOL;  Surgeon: Arta Silence, MD;  Location: [facility];  Service: Endoscopy;  Laterality: N/A;  . EXPLORATORY LAPAROTOMY    . FINE NEEDLE ASPIRATION  02/29/2020   Procedure: FINE NEEDLE ASPIRATION;  Surgeon: Arta Silence, MD;  Location: [facility];  Service: Endoscopy;;  . [place]   double bilateral  . IR IMAGING  GUIDED PORT INSERTION  03/26/2020  . MASS EXCISION  09/02/2012   Procedure: EXCISION MASS;  Surgeon: Earnstine Regal, MD;  Location: [facility];  Service: General;   Laterality: Left;  Excise mass Left posterior neck  . NM MYOCAR PERF WALL MOTION  05/06/2010   exercise cap 8 mets. ,mild perfusion defect in basal infer.,mid infer., apical infer., region consistent with infarct/scar. no significant ischemia  . PANCREATIC STENT PLACEMENT  02/29/2020   Procedure: PANCREATIC STENT PLACEMENT;  Surgeon: Arta Silence, MD;  Location: [facility];  Service: Endoscopy;;  . right chest wall exploration    . SPHINCTEROTOMY  02/29/2020   Procedure: SPHINCTEROTOMY;  Surgeon: Arta Silence, MD;  Location: [facility];  Service: Endoscopy;;  . [name]  03/05/2020   Procedure: [name];  Surgeon: Clarene Essex, MD;  Location: [facility];  Service: Endoscopy;;  . UPPER ESOPHAGEAL ENDOSCOPIC ULTRASOUND (EUS) N/A 02/29/2020   Procedure: UPPER ESOPHAGEAL ENDOSCOPIC ULTRASOUND (EUS)  LINEAR SCOPE;  Surgeon: Arta Silence, MD;  Location: [facility];  Service: Endoscopy;  Laterality: N/A;  DR. NEEDS TWO HOURS FOR PROCEDURE    Allergies: Chlorhexidine and Contrast media [iodinated diagnostic agents]  Medications: Prior to Admission medications   Medication Sig Start Date End Date Taking? Authorizing Provider  albuterol (PROVENTIL) (2.5 MG/3ML) 0.083% nebulizer solution Take 2.5 mg by nebulization every 6 (six) hours as needed for wheezing or shortness of breath. Patient not taking: No sig reported    [provider]  ALPRAZolam (XANAX) 0.25 MG tablet TAKE 1 TABLET(0.25 MG) BY MOUTH AT BEDTIME 09/20/20   Midge Minium, MD  amoxicillin (AMOXIL) 875 MG tablet Take 1 tablet (875 mg total) by mouth 2 (two) times daily. 11/18/20   Midge Minium, MD  aspirin 81 MG tablet Take 81 mg by mouth at bedtime.    [provider]  cetirizine (ZYRTEC) 10 MG tablet Take 10 mg by mouth daily.     [provider]  Cholecalciferol (VITAMIN D3) 5000 units CAPS Take 5,000 Units by mouth at bedtime.     [provider]  clopidogrel  (PLAVIX) 75 MG tablet Take 75 mg by mouth daily.    [provider]  Cod Liver Oil 10 MINIM CAPS 1 tablet    [provider]  dexamethasone (DECADRON) 4 MG tablet Take 2 tablets (8 mg total) by mouth 2 (two) times daily. X 3 days after each chemo. Start on day 2 06/26/20   Ladell Pier, MD  escitalopram (LEXAPRO) 10 MG tablet TAKE 1 TABLET(10 MG) BY MOUTH DAILY Patient taking differently: Take 10 mg by mouth daily. 07/08/20   Midge Minium, MD  gabapentin (NEURONTIN) 100 MG capsule TAKE 2 CAPSULES(200 MG) BY MOUTH AT BEDTIME 11/15/20   Ladell Pier, MD  metoprolol tartrate (LOPRESSOR) 50 MG tablet TAKE 1 TABLET(50 MG) BY MOUTH TWICE DAILY Patient taking differently: Take 25 mg by mouth 2 (two) times daily. 08/26/20   Lorretta Harp, MD  montelukast (SINGULAIR) 10 MG tablet TAKE 1 TABLET(10 MG) BY MOUTH AT BEDTIME 11/13/20   Midge Minium, MD  morphine (MS CONTIN) 60 MG 12 hr tablet Take 1 tablet (60 mg total) by mouth every 8 (eight) hours. 11/25/20   Ladell Pier, MD  ondansetron (ZOFRAN) 8 MG tablet Take 1 tablet (8 mg total) by mouth every 8 (eight) hours as needed for nausea or vomiting. Patient not taking: No sig reported 09/08/20   Flora Lipps, MD  oxyCODONE-acetaminophen (PERCOCET) [phone]  MG tablet Take 1 tablet by mouth every 4 (four) hours as needed for pain. 11/25/20   Ladell Pier, MD  pantoprazole (PROTONIX) 40 MG tablet TAKE 1 TABLET(40 MG) BY MOUTH TWICE DAILY 12/02/20   Lorretta Harp, MD  polyethylene glycol (MIRALAX / GLYCOLAX) 17 g packet Take 17 g by mouth daily as needed for mild constipation or moderate constipation.     [provider]  potassium chloride SA (KLOR-CON) 20 MEQ tablet TAKE 1 TABLET(20 MEQ) BY MOUTH DAILY Patient taking differently: Take 20 mEq by mouth daily. 03/15/20   Lorretta Harp, MD  predniSONE (DELTASONE) 50 MG tablet Take #1 tablet 13 hours, 7 hours and 1 hour prior to CT scan. Take Benadryl 50  mg with 1 hour dose Patient not taking: Reported on 11/25/2020 11/13/20   Ladell Pier, MD  rosuvastatin (CRESTOR) 10 MG tablet TAKE 1 TABLET(10 MG) BY MOUTH DAILY Patient taking differently: Take 10 mg by mouth daily. 01/03/20   Lorretta Harp, MD     Family History  Problem Relation Age of Onset  . Breast cancer Mother 33  . Dementia Mother   . Cystic fibrosis Father 1       late onset dx  . Healthy Brother   . Dementia Maternal Aunt   . Dementia Maternal Uncle   . Other Paternal Uncle        MVA while in the [employer]  . Arthritis Maternal Grandmother   . AAA (abdominal aortic aneurysm) Maternal Grandfather   . Other Paternal 76        old age  . Heart attack Paternal Grandfather   . Liver disease Paternal Uncle   . Pancreatitis Son        several bouts of pancreatitis  . Breast cancer Cousin 61       mat first cousin  . Colon cancer Neg Hx     Social History   Socioeconomic History  . Marital status: Married    Spouse name: Not on file  . Number of children: 2  . Years of education: Not on file  . Highest education level: Not on file  Occupational History  . 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 Smoking status: Former Smoker    Quit date: 01/28/2010    Years since quitting: 10.8  . Smokeless tobacco: Never Used  Vaping Use  . Vaping Use: Never used  Substance and Sexual Activity  . Alcohol use: Yes    Alcohol/week: 1.0 - 2.0 standard drink    Types: 1 - 2 Glasses of wine per week  . Drug use: No  . Sexual activity: Not on file  Other Topics Concern  . Not on file  Social History Narrative  . Not on file   Social Determinants of Health   Financial Resource Strain: Not on file  Food Insecurity: Not on file  Transportation Needs: Not on file  Physical Activity: Not on file  Stress: Not on file  Social Connections: Not on file    ECOG Status: 2 - Symptomatic, <50% confined to bed  Review of Systems   Constitutional: Positive for activity change, appetite change and unexpected weight change.  Respiratory: Negative.   Cardiovascular: Negative.   Gastrointestinal: Positive for abdominal pain and constipation. Negative for abdominal distention, blood in stool, diarrhea, nausea and vomiting.  Genitourinary: Negative.   Musculoskeletal: Positive for back pain.  Skin: Negative.   Neurological:  Neuropathy of hands and feet    Review of Systems: A 12 point ROS discussed and pertinent positives are indicated in the HPI above.  All other systems are negative.  Physical Exam No direct physical exam was performed (except for noted visual exam findings with Video Visits).    Vital Signs: There were no vitals taken for this visit.  Imaging: No results found.  Labs:  CBC: Recent Labs    09/18/20 0954 10/08/20 1110 10/30/20 0934 11/25/20 0928  WBC 8.0 7.6 8.3 10.0  HGB 10.5* 10.8* 10.9* 10.4*  HCT 33.5* 33.4* 33.7* 31.9*  PLT 362 285 264 368    COAGS: Recent Labs    04/18/20 1200  INR 1.1    BMP: Recent Labs    07/11/20 0759 07/24/20 0809 08/07/20 0835 08/19/20 1343 09/05/20 2146 09/18/20 0954 10/08/20 1110 10/30/20 0934 11/25/20 0928  NA 142 141 143 136   < > 138 138 138 139  K 4.1 4.0 3.8 4.0   < > 4.6 4.4 4.5 4.7  CL 108 108 110 104   < > 107 107 108 105  CO2 23 24 25 22    < > 26 25 23 27   GLUCOSE 90 93 109* 193*   < > 100* 92 94 115*  BUN 8 7* 10 8   < > 8 10 11  6*  CALCIUM 9.6 9.4 8.9 9.2   < > 9.2 8.7* 8.9 9.2  CREATININE 0.66 0.61 0.64 0.68   < > 0.68 0.65 0.62 0.63  GFRNONAA >60 >60 >60 >60   < > >60 >60 >60 >60  GFRAA >60 >60 >60 >60  --   --   --   --   --    < > = values in this interval not displayed.    LIVER FUNCTION TESTS: Recent Labs    09/18/20 0954 10/08/20 1110 10/30/20 0934 11/25/20 0928  BILITOT 0.5 0.5 0.3 0.4  AST 19 17 16 19   ALT 12 6 7 14   ALKPHOS 250* 137* 132* 188*  PROT 6.4* 6.0* 6.2* 6.8  ALBUMIN 3.0* 3.3* 3.1*  3.0*     Assessment and Plan:  I discussed details of a potential celiac plexus neurolytic ablation procedure with Mrs. Scaife and her husband by phone.  I discussed technical details of a CT-guided procedure under moderate conscious sedation on an outpatient basis with typically 4 to 6-hour recovery and no need for overnight hospitalization unless there were an unexpected complication.  Details of the procedure were discussed including the use of anesthetic, steroid and alcohol at the level of the celiac plexus.  The most common side effects are transient diarrhea and orthostatic hypotension.   Based on the recent improvement in her pain level with increase in her medications, we discussed waiting for another week or two in determining how effective the current pain regimen will be and also whether she will continue to tolerate chemotherapy as scheduled.  She is now also taking a more active role in trying to supplement calories and gain weight.  She will be discussing treatment plans with Dr. Benay Spice tomorrow.  I told her that should her pain become poorly controlled with her current levels of MS Contin and oxycodone, she would potentially benefit from at least one celiac plexus block/ablation procedure to determine if it will have some benefit for pain control.  The procedure can also be repeated if the first procedure has benefit in decreasing pain and medication requirement.  Thank you for this  interesting consult.  I greatly enjoyed meeting ASHLEYANN FLAHERTY and look forward to participating in their care.  A copy of this report was sent to the requesting provider on this date.  Electronically Signed: Azzie Roup 12/17/2020, 3:01 PM     I spent a total of 30 Minutes in remote  clinical consultation, greater than 50% of which was counseling/coordinating care for pain related to pancreatic carcinoma.    Visit type: Audio only (telephone). Audio (no video) only due to patient's lack of  internet/smartphone capability. Alternative for in-person consultation at [facility], [address] [place], [place], [place]. This visit type was conducted due to national recommendations for restrictions regarding the COVID-19 Pandemic (e.g. social distancing).  This format is felt to be most appropriate for this patient at this time.  All issues noted in this document were discussed and addressed.

## 2020-12-17 NOTE — Telephone Encounter (Signed)
Pt informed per [name] NP of pain medication change and that refills were called in. Pt verbalized understanding.

## 2020-12-17 NOTE — Telephone Encounter (Signed)
Pt called in and left message requesting refill on MSO4 and oxycodone. Submitted to MD for review.

## 2020-12-18 ENCOUNTER — Inpatient Hospital Stay: Payer: Medicare Other

## 2020-12-18 ENCOUNTER — Other Ambulatory Visit: Payer: Self-pay

## 2020-12-18 ENCOUNTER — Encounter: Payer: Self-pay | Admitting: Nurse Practitioner

## 2020-12-18 ENCOUNTER — Ambulatory Visit: Payer: Medicare Other

## 2020-12-18 ENCOUNTER — Other Ambulatory Visit: Payer: Medicare Other

## 2020-12-18 ENCOUNTER — Inpatient Hospital Stay (HOSPITAL_BASED_OUTPATIENT_CLINIC_OR_DEPARTMENT_OTHER): Payer: Medicare Other | Admitting: Nurse Practitioner

## 2020-12-18 ENCOUNTER — Inpatient Hospital Stay: Payer: Medicare Other | Admitting: Nutrition

## 2020-12-18 ENCOUNTER — Ambulatory Visit: Payer: Medicare Other | Admitting: Nurse Practitioner

## 2020-12-18 DIAGNOSIS — R112 Nausea with vomiting, unspecified: Secondary | ICD-10-CM | POA: Diagnosis not present

## 2020-12-18 DIAGNOSIS — C250 Malignant neoplasm of head of pancreas: Secondary | ICD-10-CM | POA: Diagnosis present

## 2020-12-18 DIAGNOSIS — Z95828 Presence of other vascular implants and grafts: Secondary | ICD-10-CM

## 2020-12-18 LAB — CMP (CANCER CENTER ONLY)
ALT: 9 U/L (ref 0–44)
AST: 17 U/L (ref 15–41)
Albumin: 3.3 g/dL — ABNORMAL LOW (ref 3.5–5.0)
Alkaline Phosphatase: 140 U/L — ABNORMAL HIGH (ref 38–126)
Anion gap: 7 (ref 5–15)
BUN: 15 mg/dL (ref 8–23)
CO2: 25 mmol/L (ref 22–32)
Calcium: 8.8 mg/dL — ABNORMAL LOW (ref 8.9–10.3)
Chloride: 106 mmol/L (ref 98–111)
Creatinine: 0.63 mg/dL (ref 0.44–1.00)
GFR, Estimated: >60 mL/min (ref >60)
Glucose, Bld: 89 mg/dL (ref 70–99)
Potassium: 4.4 mmol/L (ref 3.5–5.1)
Sodium: 138 mmol/L (ref 135–145)
Total Bilirubin: 0.3 mg/dL (ref 0.3–1.2)
Total Protein: 6.4 g/dL — ABNORMAL LOW (ref 6.5–8.1)

## 2020-12-18 LAB — CBC WITH DIFFERENTIAL (CANCER CENTER ONLY)
Abs Immature Granulocytes: 0.04 10*3/uL (ref 0.00–0.07)
Basophils Absolute: 0 10*3/uL (ref 0.0–0.1)
Basophils Relative: 0 %
Eosinophils Absolute: 0.6 10*3/uL — ABNORMAL HIGH (ref 0.0–0.5)
Eosinophils Relative: 6 %
HCT: 33.2 % — ABNORMAL LOW (ref 36.0–46.0)
Hemoglobin: 10.5 g/dL — ABNORMAL LOW (ref 12.0–15.0)
Immature Granulocytes: 0 %
Lymphocytes Relative: 15 %
Lymphs Abs: 1.6 10*3/uL (ref 0.7–4.0)
MCH: 28.3 pg (ref 26.0–34.0)
MCHC: 31.6 g/dL (ref 30.0–36.0)
MCV: 89.5 fL (ref 80.0–100.0)
Monocytes Absolute: 0.6 10*3/uL (ref 0.1–1.0)
Monocytes Relative: 6 %
Neutro Abs: 8 10*3/uL — ABNORMAL HIGH (ref 1.7–7.7)
Neutrophils Relative %: 73 %
Platelet Count: 291 10*3/uL (ref 150–400)
RBC: 3.71 MIL/uL — ABNORMAL LOW (ref 3.87–5.11)
RDW: 17.5 % — ABNORMAL HIGH (ref 11.5–15.5)
WBC Count: 11 10*3/uL — ABNORMAL HIGH (ref 4.0–10.5)
nRBC: 0 % (ref 0.0–0.2)

## 2020-12-18 MED ORDER — OXYCODONE HCL 10 MG PO TABS: 10.0000 mg | ORAL_TABLET | ORAL | 0 refills | Status: DC | PRN

## 2020-12-18 MED ORDER — SODIUM CHLORIDE 0.9% FLUSH
10.0000 mL | Freq: Once | INTRAVENOUS | Status: AC
  Administered 2020-12-18: 10 mL
  Filled 2020-12-18: qty 10

## 2020-12-18 MED ORDER — MORPHINE SULFATE ER 100 MG PO TBCR: 100.0000 mg | EXTENDED_RELEASE_TABLET | Freq: Three times a day (TID) | ORAL | 0 refills | Status: DC

## 2020-12-18 MED FILL — MORPHINE SULF ER 100 MG TAB: 100 | 30 days supply | Qty: 90 | Fill #0

## 2020-12-18 MED FILL — oxyCODONE HCL 10 MG TABS: 10 | 8 days supply | Qty: 100 | Fill #0

## 2020-12-18 NOTE — Progress Notes (Signed)
Insurance requesting paperwork to complete medication request talked to and received forms from [place] member ID [id number] spoke with seth at [phone]

## 2020-12-18 NOTE — Progress Notes (Addendum)
[facility] OFFICE PROGRESS NOTE   Diagnosis: Pancreas cancer  INTERVAL HISTORY:   Vicki [name] returns as scheduled.  She feels the adjustment to the pain medication regimen has helped.  She continues to have intermittent nausea/vomiting.  Bowels alternate constipation and diarrhea.  Objective:  Vital signs in last 24 hours:  Blood pressure 112/68, pulse (!) 59, temperature 97.8 F (36.6 C), temperature source Tympanic, resp. rate 18, height 5\' 6"  (1.676 m), weight 119 lb 6.4 oz (54.2 kg), SpO2 99 %.   Resp: Lungs clear bilaterally. Cardio: Regular rate and rhythm. GI: Abdomen soft and nontender.  No hepatosplenomegaly. Vascular: No leg edema. Port-A-Cath without erythema.  Lab Results:  Lab Results  Component Value Date   WBC 11.0 (H) 12/18/2020   HGB 10.5 (L) 12/18/2020   HCT 33.2 (L) 12/18/2020   MCV 89.5 12/18/2020   PLT 291 12/18/2020   NEUTROABS 8.0 (H) 12/18/2020    Imaging:  IR Radiologist Eval & Mgmt  Result Date: 12/17/2020 Please refer to notes tab for details about interventional procedure. (Op Note)   Medications: I have reviewed the patient's current medications.  Assessment/Plan: 1. 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 MRI abdomen 02/01/2020-2.2 x 3.3 cm mass in the posterior pancreas head/uncinate process, mild intrahepatic/extrahepatic ductal dilatation, no evidence of vascular invasion, possible small lymph nodes in the porta hepatis-poorly visualized ? EUS 02/29/2020-20 x 23 mm mass in the pancreas head, upstream pancreatic duct dilatation, 1 abnormal peripancreatic node, uT3?uN1 ? 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 CTs 03/19/2020-2.9 x 2.1 x 3.1 cm pancreas head/uncinate mass, lesion associated with infrarenal abdominal aorta and superior mesenteric artery, prominent and borderline enlarged retroperitoneal nodes including a left periaortic node, indeterminate  4 mm lung nodule ? Cycle 1 mFOLFIRINOX 06/08/61, complicated by n/v and hospital admission 5/2/ - 5/3 ? Second opinion at [facility] with Dr. Earnestine Mealing and Dr. Mariah Milling 04/02/20 ? PET scan 04/03/2020-hypermetabolic poorly marginated pancreatic head mass; 2 hypermetabolic left periaortic lymph nodes, hypermetabolic left supraclavicular lymph node ? Cycle 2 FOLFIRINOX 04/11/2020 ? Ultrasound-guided biopsy of a left supraclavicular mass on 04/18/2020-poorly differentiated adenocarcinoma, cytokeratin 7+, cytokeratin 20 and CDX2 positive in rare cells. ? Cycle 3 FOLFIRINOX 04/24/2020-oxaliplatin infusion lengthened to 3 hours, Udenyca was not given (ordered) ? Cycle 4 FOLFIRINOX 05/08/2020 ? Cycle 5 FOLFIRINOX 05/22/2020-atropine and Ativan premedication added secondary to cholinergic symptoms and nausea from irinotecan ? CTs 05/29/2020-decreased left supraclavicular lymph node, slight decrease in size of the pancreas head mass, decrease in retroperitoneal lymphadenopathy ? Cycle 6 FOLFIRINOX 06/05/2020 ? Cycle 7 FOLFIRINOX 06/26/2020 ? Cycle 8 FOLFIRINOX 07/11/2020 (oxaliplatin held secondary to neuropathy) ? Cycle 9 FOLFIRINOX 07/24/2020 (oxaliplatin held secondary to neuropathy) ? Cycle 10 FOLFIRINOX 08/07/2020 (oxaliplatin held secondary to neuropathy) ? CTs 08/19/2020-subtle 5 mm peripheral right liver lesion, pancreas mass unchanged and difficult to discretely delineate, unchanged left retroperitoneal node, no evidence of disease progression, onDr. Sherrill'sreview of the CTs there is no change in the previously enlarged left supraclavicular node ? Cycle 11 FOLFIRINOX 09/18/2020 (oxaliplatin held secondary to neuropathy) ? Cycle 12 FOLFIRINOX 10/08/2020 (oxaliplatin held secondary to neuropathy) ? Cycle 13 FOLFIRINOX 10/30/2020 (oxaliplatin held secondary to neuropathy) ? CTs 11/14/2020-no evidence of thoracic metastases, slight enlargement of the pancreas head mass, increase in size of periaortic lymph node ? Cycle 14  FOLFIRINOX 11/25/2020 (oxaliplatin held secondary to neuropathy)  2. Abdomen/back pain secondary to #1-progressive January 2022 3. Anorexia/weight loss secondary to #1 4. History  of coronary artery disease,STEMI with the fib arrest 2011, status post RCA stent 5. Constipation, secondary to #1 and morphine 6. 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 Metal stent placed into the common bile duct 09/06/2020.   Disposition: Vicki [name] appears unchanged.  She was last treated with FOLFIRI 11/25/2020.  She has persistent abdominal pain.  She was evaluated by Dr. Kathlene Cote yesterday for possible celiac plexus block.  Decision made to reevaluate over the next 1 to 2 weeks before proceeding as she has noticed some improvement with the adjustment to pain medications.  Chemotherapy is being placed on hold.  She has an appointment with Dr. Earnestine Mealing at [facility] on 12/31/2020.  We will see her back here on 01/01/2021.  She will contact the office in the interim with any problems.  Patient seen with Dr. Benay Spice.    [name] ANP/GNP-BC   12/18/2020  9:57 AM  This was a shared visit with [name].  Ms. Loughridge continues to have abdomen and back pain.  The MS Contin dose was increased again yesterday.  She continues oxycodone for breakthrough pain.  The plan is to proceed with a celiac block if the pain is not under better control with the increased MS Contin dosing.  Chemotherapy will be placed on hold until after her follow-up visit with Dr. Earnestine Mealing week.  Julieanne Manson, MD

## 2020-12-19 ENCOUNTER — Telehealth: Payer: Self-pay | Admitting: Nurse Practitioner

## 2020-12-19 LAB — CANCER ANTIGEN 19-9: CA 19-9: 74 U/mL — ABNORMAL HIGH (ref 0–35)

## 2020-12-19 NOTE — Telephone Encounter (Signed)
Scheduled appointments per 1/19 los. Called patient, no answer. Left message with appointments dates and times  °

## 2020-12-24 ENCOUNTER — Other Ambulatory Visit: Payer: Medicare Other

## 2020-12-24 ENCOUNTER — Other Ambulatory Visit: Payer: Self-pay

## 2020-12-24 ENCOUNTER — Telehealth (INDEPENDENT_AMBULATORY_CARE_PROVIDER_SITE_OTHER): Payer: Medicare Other | Admitting: Family Medicine

## 2020-12-24 DIAGNOSIS — R059 Cough, unspecified: Secondary | ICD-10-CM

## 2020-12-24 DIAGNOSIS — Z20822 Contact with and (suspected) exposure to covid-19: Secondary | ICD-10-CM

## 2020-12-24 MED ORDER — BENZONATATE 100 MG PO CAPS: 100.0000 mg | ORAL_CAPSULE | Freq: Three times a day (TID) | ORAL | 0 refills | Status: DC | PRN

## 2020-12-24 MED ORDER — DOXYCYCLINE HYCLATE 100 MG PO TABS: 100.0000 mg | ORAL_TABLET | Freq: Two times a day (BID) | ORAL | 0 refills | Status: DC

## 2020-12-24 NOTE — Progress Notes (Signed)
Virtual Visit via Video Note  I connected with Vicki [name]  on 12/24/20 at  3:00 PM EST by a video enabled telemedicine application and verified that I am speaking with the correct person using two identifiers.  Location patient: home, [place] Location provider:work or home office Persons participating in the virtual visit: patient, provider  I discussed the limitations of evaluation and management by telemedicine and the availability of in person appointments. The patient expressed understanding and agreed to proceed.   HPI:  Acute telemedicine visit for : -Onset: 2 days ago -Symptoms include: cough, thick congestion lower throat and upper chest, hoarseness -Denies: CP, SOB, fevers, body aches, NVD, known sick contacts -Has tried:robitussin, inhaler - uses whenever she is sick, reports history of pneumonia -Pertinent past medical history: pancreatic cancer, currently on chemo -reports PCP -Pertinent medication allergies: nkda -COVID-19 vaccine status: fully vaccinated for covid + booster; had flu shot  ROS: See pertinent positives and negatives per HPI.  Past Medical History:  Diagnosis Date  . CAD (coronary artery disease) 2011   with stent to RCA and 60 % LAD disease  . Clotting disorder ([place])   . Family history of breast cancer   . Family history of cystic fibrosis   . H/O cardiac arrest 02/15/10   with STEMI- Inf wall  . Hyperlipidemia   . Hypertension   . Myocardial infarct ([place]) 02/15/10  . pancreatic ca dx'd 02/2020  . Shock, cardiogenic ([place]) 01/2010   with MI, IABP    Past Surgical History:  Procedure Laterality Date  . ABDOMINAL HYSTERECTOMY  1987   BSO  . APPENDECTOMY  1987  . BILIARY STENT PLACEMENT N/A 03/05/2020   Procedure: BILIARY STENT PLACEMENT;  Surgeon: Clarene Essex, MD;  [facility]: [facility];  Service: Endoscopy;  Laterality: N/A;  . BILIARY STENT PLACEMENT N/A 09/06/2020   Procedure: BILIARY STENT PLACEMENT;  Surgeon: Clarene Essex, MD;  [facility];   Service: Endoscopy;  Laterality: N/A;  . CORONARY ANGIOPLASTY WITH STENT PLACEMENT  02/15/10   Stent to prox RCA -BMS  . DOPPLER ECHOCARDIOGRAPHY  05/06/2010   EF =50-55%  lvfx low normal ;no sigificant valvular disease seen  . ENDOSCOPIC RETROGRADE CHOLANGIOPANCREATOGRAPHY (ERCP) WITH PROPOFOL N/A 02/29/2020   Procedure: ENDOSCOPIC RETROGRADE CHOLANGIOPANCREATOGRAPHY (ERCP) WITH PROPOFOL;  Surgeon: Arta Silence, MD;  [facility];  Service: Endoscopy;  Laterality: N/A;  . ERCP N/A 03/05/2020   Procedure: ENDOSCOPIC RETROGRADE CHOLANGIOPANCREATOGRAPHY (ERCP);  Surgeon: Clarene Essex, MD;  Location: [facility];  Service: Endoscopy;  Laterality: N/A;  with stent placement  . ERCP N/A 09/06/2020   Procedure: ENDOSCOPIC RETROGRADE CHOLANGIOPANCREATOGRAPHY (ERCP);  Surgeon: Clarene Essex, MD;  Location: [facility];  Service: Endoscopy;  Laterality: N/A;  . ESOPHAGOGASTRODUODENOSCOPY (EGD) WITH PROPOFOL N/A 02/29/2020   Procedure: ESOPHAGOGASTRODUODENOSCOPY (EGD) WITH PROPOFOL;  Surgeon: Arta Silence, MD;  [facility];  Service: Endoscopy;  Laterality: N/A;  . EXPLORATORY LAPAROTOMY    . FINE NEEDLE ASPIRATION  02/29/2020   Procedure: FINE NEEDLE ASPIRATION;  Surgeon: Arta Silence, MD;  [facility];  Service: Endoscopy;;  . HERNIA REPAIR  1980   double bilateral  . IR IMAGING GUIDED PORT INSERTION  03/26/2020  . IR RADIOLOGIST EVAL & MGMT  12/17/2020  . MASS EXCISION  09/02/2012   Procedure: EXCISION MASS;  Surgeon: Earnstine Regal, MD;  Location: [facility];  Service: General;  Laterality: Left;  Excise mass Left posterior neck  . NM MYOCAR PERF WALL MOTION  05/06/2010   exercise cap 8  mets. ,mild perfusion defect in basal infer.,mid infer., apical infer., region consistent with infarct/scar. no significant ischemia  . PANCREATIC STENT PLACEMENT  02/29/2020   Procedure: PANCREATIC STENT PLACEMENT;  Surgeon: Arta Silence, MD;  [facility];   Service: Endoscopy;;  . right chest wall exploration    . SPHINCTEROTOMY  02/29/2020   Procedure: SPHINCTEROTOMY;  Surgeon: Arta Silence, MD;  Location: [facility];  Service: Endoscopy;;  . [name]  03/05/2020   Procedure: [name];  Surgeon: Clarene Essex, MD;  [facility];  Service: Endoscopy;;  . UPPER ESOPHAGEAL ENDOSCOPIC ULTRASOUND (EUS) N/A 02/29/2020   Procedure: UPPER ESOPHAGEAL ENDOSCOPIC ULTRASOUND (EUS)  LINEAR SCOPE;  Surgeon: Arta Silence, MD;  [facility];  Service: Endoscopy;  Laterality: N/A;  DR. NEEDS TWO HOURS FOR PROCEDURE     Current Outpatient Medications:  .  albuterol (PROVENTIL) (2.5 MG/3ML) 0.083% nebulizer solution, Take 2.5 mg by nebulization every 6 (six) hours as needed for wheezing or shortness of breath., Disp: , Rfl:  .  ALPRAZolam (XANAX) 0.25 MG tablet, TAKE 1 TABLET(0.25 MG) BY MOUTH AT BEDTIME, Disp: 30 tablet, Rfl: 3 .  amoxicillin (AMOXIL) 875 MG tablet, Take 1 tablet (875 mg total) by mouth 2 (two) times daily., Disp: 20 tablet, Rfl: 0 .  aspirin 81 MG tablet, Take 81 mg by mouth at bedtime., Disp: , Rfl:  .  cetirizine (ZYRTEC) 10 MG tablet, Take 10 mg by mouth daily. , Disp: , Rfl:  .  Cholecalciferol (VITAMIN D3) 5000 units CAPS, Take 5,000 Units by mouth at bedtime. , Disp: , Rfl:  .  clopidogrel (PLAVIX) 75 MG tablet, Take 75 mg by mouth daily., Disp: , Rfl:  .  Cod Liver Oil 10 MINIM CAPS, 1 tablet, Disp: , Rfl:  .  dexamethasone (DECADRON) 4 MG tablet, Take 2 tablets (8 mg total) by mouth 2 (two) times daily. X 3 days after each chemo. Start on day 2, Disp: 24 tablet, Rfl: 1 .  escitalopram (LEXAPRO) 10 MG tablet, TAKE 1 TABLET(10 MG) BY MOUTH DAILY (Patient taking differently: Take 10 mg by mouth daily.), Disp: 90 tablet, Rfl: 0 .  gabapentin (NEURONTIN) 100 MG capsule, TAKE 2 CAPSULES(200 MG) BY MOUTH AT BEDTIME, Disp: 60 capsule, Rfl: 2 .  metoprolol tartrate (LOPRESSOR) 50 MG tablet, TAKE 1 TABLET(50 MG) BY  MOUTH TWICE DAILY (Patient taking differently: Take 25 mg by mouth 2 (two) times daily.), Disp: 180 tablet, Rfl: 1 .  montelukast (SINGULAIR) 10 MG tablet, TAKE 1 TABLET(10 MG) BY MOUTH AT BEDTIME, Disp: 90 tablet, Rfl: 1 .  morphine (MS CONTIN) 100 MG 12 hr tablet, Take 1 tablet (100 mg total) by mouth every 8 (eight) hours., Disp: 90 tablet, Rfl: 0 .  ondansetron (ZOFRAN) 8 MG tablet, Take 1 tablet (8 mg total) by mouth every 8 (eight) hours as needed for nausea or vomiting. (Patient not taking: No sig reported), Disp: 60 tablet, Rfl: 1 .  Oxycodone HCl 10 MG TABS, Take 1-3 tablets (10-30 mg total) by mouth every 4 (four) hours as needed., Disp: 100 tablet, Rfl: 0 .  pantoprazole (PROTONIX) 40 MG tablet, TAKE 1 TABLET(40 MG) BY MOUTH TWICE DAILY, Disp: 180 tablet, Rfl: 3 .  polyethylene glycol (MIRALAX / GLYCOLAX) 17 g packet, Take 17 g by mouth daily as needed for mild constipation or moderate constipation. , Disp: , Rfl:  .  potassium chloride SA (KLOR-CON) 20 MEQ tablet, TAKE 1 TABLET(20 MEQ) BY MOUTH DAILY (Patient taking differently: Take 20 mEq by  mouth daily.), Disp: 90 tablet, Rfl: 3 .  predniSONE (DELTASONE) 50 MG tablet, Take #1 tablet 13 hours, 7 hours and 1 hour prior to CT scan. Take Benadryl 50 mg with 1 hour dose (Patient not taking: No sig reported), Disp: 3 tablet, Rfl: 1 .  rosuvastatin (CRESTOR) 10 MG tablet, TAKE 1 TABLET(10 MG) BY MOUTH DAILY (Patient taking differently: Take 10 mg by mouth daily.), Disp: 90 tablet, Rfl: 3  EXAM:  VITALS per patient if applicable:  GENERAL: alert, oriented, appears well and in no acute distress  HEENT: atraumatic, conjunttiva clear, no obvious abnormalities on inspection of external nose and ears  NECK: normal movements of the head and neck  LUNGS: on inspection no signs of respiratory distress, breathing rate appears normal, no obvious gross SOB, gasping or wheezing  CV: no obvious cyanosis  MS: moves all visible extremities without  noticeable abnormality  PSYCH/NEURO: pleasant and cooperative, no obvious depression or anxiety, speech and thought processing grossly intact  ASSESSMENT AND PLAN:  Discussed the following assessment and plan:  No diagnosis found.  -we discussed possible serious and likely etiologies, options for evaluation and workup, limitations of telemedicine visit vs in person visit, treatment, treatment risks and precautions. Pt prefers to treat via telemedicine empirically rather than in person at this moment. [place], COVID19 vs other. Given high risk for bacterial illness and her history, after lengthy discussion, she prefers to start doxy 100mg  bid x 7-10 while COVID19 testing is pending. Also sent tessalon for cough and she has alb to use prn q 4-6 hours if needed. A Scheduled follow up with PCP offered:agrees to schedule follow up if needed Advised to seek prompt follow up video visit or in person care if worsening, new symptoms arise, or if is not improving with treatment. Advise follow up video visit if covid test is positive as well.  Discussed options for inperson care if PCP office not available. Did let this patient know that I only do telemedicine on Tuesdays and Thursdays for [place]. Advised to schedule follow up visit with PCP or [facility] if any further questions or concerns to avoid delays in care.   I discussed the assessment and treatment plan with the patient. The patient was provided an opportunity to ask questions and all were answered. The patient agreed with the plan and demonstrated an understanding of the instructions.     Lucretia Kern, DO

## 2020-12-24 NOTE — Patient Instructions (Addendum)
  HOME CARE TIPS:  -[place] testing information: https://[email or web address].org/ OR [phone] Most pharmacies also offer testing and home test kits.  -I sent the medication(s) we discussed to your pharmacy: Meds ordered this encounter  Medications  . doxycycline (VIBRA-TABS) 100 MG tablet    Sig: Take 1 tablet (100 mg total) by mouth 2 (two) times daily.    Dispense:  20 tablet    Refill:  0  . benzonatate (TESSALON PERLES) 100 MG capsule    Sig: Take 1 capsule (100 mg total) by mouth 3 (three) times daily as needed.    Dispense:  20 capsule    Refill:  0     -schedule follow up video visit through your primary care office if positive covid test  -can use nasal saline a few times per day if you have nasal congestion  -stay hydrated, drink plenty of fluids and eat small healthy meals - avoid dairy  -If the Covid test is positive, check out the [facility] website for more information on home care, transmission and treatment for COVID19  -follow up with your doctor in 2-3 days unless improving and feeling better  -stay home while sick, except to seek medical care, and if you have [place] ideally it would be best to stay home for a full 10 days since the onset of symptoms PLUS one day of no fever and feeling better. Wear a good mask (such as N95 or KN95) if around others to reduce the risk of transmission.  It was nice to meet you today, and I really hope you are feeling better soon. I help  out with telemedicine visits on Tuesdays and Thursdays and am available for visits on those days. If you have any concerns or questions following this visit please schedule a follow up visit with your Primary Care doctor or seek care at a local urgent care clinic to avoid delays in care.    Seek in person care or schedule a follow up video visit promptly if your symptoms worsen, new concerns arise or you are not improving with treatment. Call 911 and/or  seek emergency care if your symptoms are severe or life threatening.

## 2020-12-26 LAB — SARS-COV-2, NAA 2 DAY TAT

## 2020-12-26 LAB — NOVEL CORONAVIRUS, NAA: SARS-CoV-2, NAA: NOT DETECTED

## 2021-01-01 ENCOUNTER — Telehealth: Payer: Self-pay | Admitting: *Deleted

## 2021-01-01 ENCOUNTER — Other Ambulatory Visit: Payer: Self-pay | Admitting: *Deleted

## 2021-01-01 ENCOUNTER — Other Ambulatory Visit: Payer: Self-pay | Admitting: Nurse Practitioner

## 2021-01-01 ENCOUNTER — Telehealth: Payer: Self-pay

## 2021-01-01 DIAGNOSIS — C250 Malignant neoplasm of head of pancreas: Secondary | ICD-10-CM

## 2021-01-01 MED ORDER — OXYCODONE HCL 10 MG PO TABS: 10.0000 mg | ORAL_TABLET | ORAL | 0 refills | Status: DC | PRN

## 2021-01-01 MED FILL — Dexamethasone Sodium Phosphate Inj 100 MG/10ML: INTRAMUSCULAR | Qty: 1 | Status: AC

## 2021-01-01 MED FILL — Fosaprepitant Dimeglumine For IV Infusion 150 MG (Base Eq): INTRAVENOUS | Qty: 5 | Status: AC

## 2021-01-01 MED FILL — oxyCODONE HCL 10 MG TABS: 10 | 10 days supply | Qty: 100 | Fill #0

## 2021-01-01 NOTE — Telephone Encounter (Signed)
Left VM that she has laryngitis--went to [facility] yesterday. Need to talk about this tomorrow. Also needs refill on oxycodone today--sent to [facility] please. MD notified.

## 2021-01-01 NOTE — Telephone Encounter (Signed)
Pt aware that her pain med has been authorized.

## 2021-01-01 NOTE — Progress Notes (Signed)
Was notified by patient that the oxycodone sent in needs a PA. Confirmed this with [facility]. Called [facility] to obtain PA and was informed it was already approved for 11/30/20 to 11/29/21 with reference #[id number]. Faxed this information to [place].

## 2021-01-02 ENCOUNTER — Other Ambulatory Visit: Payer: Self-pay

## 2021-01-02 ENCOUNTER — Encounter: Payer: Self-pay | Admitting: Nurse Practitioner

## 2021-01-02 ENCOUNTER — Inpatient Hospital Stay: Payer: Medicare Other | Attending: Oncology

## 2021-01-02 ENCOUNTER — Inpatient Hospital Stay: Payer: Medicare Other

## 2021-01-02 ENCOUNTER — Inpatient Hospital Stay (HOSPITAL_BASED_OUTPATIENT_CLINIC_OR_DEPARTMENT_OTHER): Payer: Medicare Other | Admitting: Nurse Practitioner

## 2021-01-02 VITALS — BP 144/86 | HR 67 | Temp 97.8°F | Resp 18 | Ht 66.0 in | Wt 118.6 lb

## 2021-01-02 DIAGNOSIS — Z5111 Encounter for antineoplastic chemotherapy: Secondary | ICD-10-CM | POA: Insufficient documentation

## 2021-01-02 DIAGNOSIS — G893 Neoplasm related pain (acute) (chronic): Secondary | ICD-10-CM | POA: Insufficient documentation

## 2021-01-02 DIAGNOSIS — Z95828 Presence of other vascular implants and grafts: Secondary | ICD-10-CM | POA: Diagnosis not present

## 2021-01-02 DIAGNOSIS — C250 Malignant neoplasm of head of pancreas: Secondary | ICD-10-CM | POA: Diagnosis present

## 2021-01-02 LAB — CMP (CANCER CENTER ONLY)
ALT: 13 U/L (ref 0–44)
AST: 18 U/L (ref 15–41)
Albumin: 3.4 g/dL — ABNORMAL LOW (ref 3.5–5.0)
Alkaline Phosphatase: 134 U/L — ABNORMAL HIGH (ref 38–126)
Anion gap: 5 (ref 5–15)
BUN: 15 mg/dL (ref 8–23)
CO2: 26 mmol/L (ref 22–32)
Calcium: 8.5 mg/dL — ABNORMAL LOW (ref 8.9–10.3)
Chloride: 107 mmol/L (ref 98–111)
Creatinine: 0.64 mg/dL (ref 0.44–1.00)
GFR, Estimated: >60 mL/min (ref >60)
Glucose, Bld: 102 mg/dL — ABNORMAL HIGH (ref 70–99)
Potassium: 4.2 mmol/L (ref 3.5–5.1)
Sodium: 138 mmol/L (ref 135–145)
Total Bilirubin: 0.3 mg/dL (ref 0.3–1.2)
Total Protein: 6.1 g/dL — ABNORMAL LOW (ref 6.5–8.1)

## 2021-01-02 LAB — CBC WITH DIFFERENTIAL (CANCER CENTER ONLY)
Abs Immature Granulocytes: 0.03 10*3/uL (ref 0.00–0.07)
Basophils Absolute: 0 10*3/uL (ref 0.0–0.1)
Basophils Relative: 0 %
Eosinophils Absolute: 0.1 10*3/uL (ref 0.0–0.5)
Eosinophils Relative: 1 %
HCT: 32.3 % — ABNORMAL LOW (ref 36.0–46.0)
Hemoglobin: 10.5 g/dL — ABNORMAL LOW (ref 12.0–15.0)
Immature Granulocytes: 0 %
Lymphocytes Relative: 18 %
Lymphs Abs: 1.5 10*3/uL (ref 0.7–4.0)
MCH: 29 pg (ref 26.0–34.0)
MCHC: 32.5 g/dL (ref 30.0–36.0)
MCV: 89.2 fL (ref 80.0–100.0)
Monocytes Absolute: 0.5 10*3/uL (ref 0.1–1.0)
Monocytes Relative: 6 %
Neutro Abs: 6.2 10*3/uL (ref 1.7–7.7)
Neutrophils Relative %: 75 %
Platelet Count: 347 10*3/uL (ref 150–400)
RBC: 3.62 MIL/uL — ABNORMAL LOW (ref 3.87–5.11)
RDW: 18.2 % — ABNORMAL HIGH (ref 11.5–15.5)
WBC Count: 8.3 10*3/uL (ref 4.0–10.5)
nRBC: 0 % (ref 0.0–0.2)

## 2021-01-02 MED ORDER — SODIUM CHLORIDE 0.9% FLUSH
10.0000 mL | Freq: Once | INTRAVENOUS | Status: AC
  Administered 2021-01-02: 10 mL
  Filled 2021-01-02: qty 10

## 2021-01-02 MED ORDER — SODIUM CHLORIDE 0.9% FLUSH
10.0000 mL | INTRAVENOUS | Status: DC | PRN
  Administered 2021-01-02: 10 mL via INTRAVENOUS
  Filled 2021-01-02: qty 10

## 2021-01-02 MED ORDER — HEPARIN SOD (PORK) LOCK FLUSH 100 UNIT/ML IV SOLN
500.0000 [IU] | Freq: Once | INTRAVENOUS | Status: AC
  Administered 2021-01-02: 500 [IU] via INTRAVENOUS
  Filled 2021-01-02: qty 5

## 2021-01-02 NOTE — Progress Notes (Signed)
Provided reading information on Gemzar and Abraxane

## 2021-01-02 NOTE — Progress Notes (Signed)
DISCONTINUE ON PATHWAY REGIMEN - Pancreatic Adenocarcinoma     A cycle is every 14 days:     Oxaliplatin      Leucovorin      Irinotecan      Fluorouracil   **Always confirm dose/schedule in your pharmacy ordering system**  REASON: Disease Progression PRIOR TREATMENT: PANOS98: mFOLFIRINOX q14 Days Until Progression or Toxicity TREATMENT RESPONSE: Progressive Disease (PD)  START ON PATHWAY REGIMEN - Pancreatic Adenocarcinoma     A cycle is every 28 days:     Nab-paclitaxel (protein bound)      Gemcitabine   **Always confirm dose/schedule in your pharmacy ordering system**  Patient Characteristics: Metastatic Disease, Second Line, MSS/pMMR or MSI Unknown, Fluoropyrimidine-Based Therapy First Line Therapeutic Status: Metastatic Disease Line of Therapy: Second Line Microsatellite/Mismatch Repair Status: MSS/pMMR Intent of Therapy: Non-Curative / Palliative Intent, Discussed with Patient

## 2021-01-02 NOTE — Progress Notes (Addendum)
[facility] OFFICE PROGRESS NOTE   Diagnosis: Pancreas cancer  INTERVAL HISTORY:   [name] returns as scheduled.  She continues to have back pain.  Current regimen consists of MS Contin 100 mg every 8 hours with oxycodone 10 mg tablets 1 to 3 tablets every 4 hours as needed.  She estimates taking 16-18 oxycodone tablets a day.  Bowels are moving.  She developed hoarseness about 2 weeks ago, level varies. Neuropathy symptoms persist.  Objective:  Vital signs in last 24 hours:  Blood pressure (!) 144/86, pulse 67, temperature 97.8 F (36.6 C), temperature source Tympanic, resp. rate 18, height 5\' 6"  (1.676 m), weight 118 lb 9.6 oz (53.8 kg), SpO2 100 %.    HEENT: No thrush or ulcers. Lymphatics: Firm palpable lymph node left low neck/supraclavicular region. Resp: Lungs clear bilaterally. Cardio: Regular rate and rhythm. GI: No hepatomegaly. Vascular: No leg edema. Port-A-Cath without erythema.  Lab Results:  Lab Results  Component Value Date   WBC 8.3 01/02/2021   HGB 10.5 (L) 01/02/2021   HCT 32.3 (L) 01/02/2021   MCV 89.2 01/02/2021   PLT 347 01/02/2021   NEUTROABS 6.2 01/02/2021    Imaging:  No results found.  Medications: I have reviewed the patient's current medications.  Assessment/Plan: 1. 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 MRI abdomen 02/01/2020-2.2 x 3.3 cm mass in the posterior pancreas head/uncinate process, mild intrahepatic/extrahepatic ductal dilatation, no evidence of vascular invasion, possible small lymph nodes in the porta hepatis-poorly visualized ? EUS 02/29/2020-20 x 23 mm mass in the pancreas head, upstream pancreatic duct dilatation, 1 abnormal peripancreatic node, uT3?uN1 ? 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 CTs 03/19/2020-2.9 x 2.1 x 3.1 cm pancreas head/uncinate mass, lesion associated with infrarenal abdominal aorta and superior mesenteric artery,  prominent and borderline enlarged retroperitoneal nodes including a left periaortic node, indeterminate 4 mm lung nodule ? Cycle 1 mFOLFIRINOX 8/36/62, complicated by n/v and hospital admission 5/2/ - 5/3 ? Second opinion at [facility] with Dr. Earnestine Mealing and Dr. Mariah Milling 04/02/20 ? PET scan 04/03/2020-hypermetabolic poorly marginated pancreatic head mass; 2 hypermetabolic left periaortic lymph nodes, hypermetabolic left supraclavicular lymph node ? Cycle 2 FOLFIRINOX 04/11/2020 ? Ultrasound-guided biopsy of a left supraclavicular mass on 04/18/2020-poorly differentiated adenocarcinoma, cytokeratin 7+, cytokeratin 20 and CDX2 positive in rare cells. ? Cycle 3 FOLFIRINOX 04/24/2020-oxaliplatin infusion lengthened to 3 hours, Udenyca was not given (ordered) ? Cycle 4 FOLFIRINOX 05/08/2020 ? Cycle 5 FOLFIRINOX 05/22/2020-atropine and Ativan premedication added secondary to cholinergic symptoms and nausea from irinotecan ? CTs 05/29/2020-decreased left supraclavicular lymph node, slight decrease in size of the pancreas head mass, decrease in retroperitoneal lymphadenopathy ? Cycle 6 FOLFIRINOX 06/05/2020 ? Cycle 7 FOLFIRINOX 06/26/2020 ? Cycle 8 FOLFIRINOX 07/11/2020 (oxaliplatin held secondary to neuropathy) ? Cycle 9 FOLFIRINOX 07/24/2020 (oxaliplatin held secondary to neuropathy) ? Cycle 10 FOLFIRINOX 08/07/2020 (oxaliplatin held secondary to neuropathy) ? CTs 08/19/2020-subtle 5 mm peripheral right liver lesion, pancreas mass unchanged and difficult to discretely delineate, unchanged left retroperitoneal node, no evidence of disease progression, onDr. Sherrill'sreview of the CTs there is no change in the previously enlarged left supraclavicular node ? Cycle 11 FOLFIRINOX 09/18/2020 (oxaliplatin held secondary to neuropathy) ? Cycle 12 FOLFIRINOX 10/08/2020 (oxaliplatin held secondary to neuropathy) ? Cycle 13 FOLFIRINOX 10/30/2020 (oxaliplatin held secondary to neuropathy) ? CTs 11/14/2020-no evidence of thoracic  metastases, slight enlargement of the pancreas head mass, increase in size of periaortic lymph node ? Cycle 14 FOLFIRINOX  11/25/2020 (oxaliplatin held secondary to neuropathy)  2. Abdomen/back pain secondary to #1-progressive January 2022 3. Anorexia/weight loss secondary to #1 4. History of coronary artery disease,STEMI with the fib arrest 2011, status post RCA stent 5. Constipation, secondary to #1 and morphine 6. 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 Metal stent placed into the common bile duct 09/06/2020. 9.  Hoarseness    Disposition: Ms. Bronkema appears unchanged.  She saw Dr. Earnestine Mealing at [facility].  Recommendation is to begin systemic therapy with gemcitabine/Abraxane.  Dr. Benay Spice agrees with this recommendation.  Potential toxicities associated with this regimen reviewed.  We discussed bone marrow toxicity, nausea, hair loss, allergic reaction.  We reviewed fever, rash after Gemcitabine.  We also discussed pneumonitis associated with Gemcitabine.  We reviewed the neuropathy associated with Abraxane.  She agrees to proceed.  She would like to begin treatment next week.  For the pain she will continue MS Contin with oxycodone as needed.  We made a referral to ENT to evaluate the hoarseness she is experiencing.  She understands the hoarseness may be related to cancer, specifically the left supraclavicular lymph node.  She will return for lab and cycle 1 gemcitabine/Abraxane on 01/08/2021.  We will see her in follow-up prior to cycle 2 on 01/22/2021.  She will contact the office in the interim with any problems.  Patient seen with Dr. Benay Spice.    [name] ANP/GNP-BC    01/02/2021  10:08 AM  This was a shared visit with [name].  Ms. Dimiceli was interviewed and examined.  I discussed the case with Dr.Abruzzese yesterday.  There is clinical and radiologic evidence of disease progression.  I recommend switching treatment to gemcitabine/Abraxane.  We reviewed potential toxicities associated with the gemcitabine/Abraxane regimen.  Ms. Colberg would like to hold off on the celiac block for now.  She will continue the current narcotic pain regimen.  We will refer her to ENT to evaluate the hoarseness.  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  A chemotherapy plan was entered.  I was present for greater than 50% of today's visit.  I performed medical decision making.  Julieanne Manson, MD

## 2021-01-02 NOTE — Progress Notes (Signed)
Faxed referral order, demographics and chart information to Dr. Izora Gala [facility]. [phone]

## 2021-01-03 ENCOUNTER — Telehealth: Payer: Self-pay

## 2021-01-03 ENCOUNTER — Telehealth: Payer: Self-pay | Admitting: Oncology

## 2021-01-03 NOTE — Telephone Encounter (Signed)
Scheduled per 02/03 los, patient has been called and notified.

## 2021-01-03 NOTE — Telephone Encounter (Signed)
Returned call to pt to give her information about ENT referral. Informed pt referral had been sent in and gave her Dr. Janeice Robinson office contact information.

## 2021-01-06 ENCOUNTER — Other Ambulatory Visit: Payer: Self-pay | Admitting: Cardiovascular Disease

## 2021-01-06 ENCOUNTER — Encounter: Payer: Self-pay | Admitting: *Deleted

## 2021-01-06 ENCOUNTER — Other Ambulatory Visit: Payer: Self-pay | Admitting: Nurse Practitioner

## 2021-01-06 DIAGNOSIS — C250 Malignant neoplasm of head of pancreas: Secondary | ICD-10-CM

## 2021-01-06 MED ORDER — OXYCODONE HCL 10 MG PO TABS: 10.0000 mg | ORAL_TABLET | ORAL | 0 refills | Status: DC | PRN

## 2021-01-06 NOTE — Progress Notes (Signed)
Oxycodone script sent to [facility] in error. Called pharmacy to cancel script so it can be sent to [facility].

## 2021-01-07 ENCOUNTER — Telehealth: Payer: Self-pay | Admitting: *Deleted

## 2021-01-07 ENCOUNTER — Other Ambulatory Visit: Payer: Self-pay | Admitting: Oncology

## 2021-01-07 DIAGNOSIS — C250 Malignant neoplasm of head of pancreas: Secondary | ICD-10-CM

## 2021-01-07 MED ORDER — OXYCODONE HCL 20 MG PO TABS: 30.0000 mg | ORAL_TABLET | ORAL | 0 refills | Status: DC | PRN

## 2021-01-07 MED FILL — OXYCODONE HCL 20 MG TABS: 20 | 22 days supply | Qty: 130 | Fill #0

## 2021-01-07 NOTE — Progress Notes (Signed)
Pharmacist Chemotherapy Monitoring - Initial Assessment    Anticipated start date: 01/08/21  Regimen:  . Are orders appropriate based on the patient's diagnosis, regimen, and cycle? Yes . Does the plan date match the patient's scheduled date? Yes . Is the sequencing of drugs appropriate? Yes . Are the premedications appropriate for the patient's regimen? Yes . Prior Authorization for treatment is: Pending o If applicable, is the correct biosimilar selected based on the patient's insurance? not applicable  Organ Function and Labs: Marland Kitchen Are dose adjustments needed based on the patient's renal function, hepatic function, or hematologic function? No . Are appropriate labs ordered prior to the start of patient's treatment? Yes . Other organ system assessment, if indicated: N/A . The following baseline labs, if indicated, have been ordered: N/A  Dose Assessment: . Are the drug doses appropriate? Yes . Are the following correct: o Drug concentrations Yes o IV fluid compatible with drug Yes o Administration routes Yes o Timing of therapy Yes . If applicable, does the patient have documented access for treatment and/or plans for port-a-cath placement? yes . If applicable, have lifetime cumulative doses been properly documented and assessed? not applicable Lifetime Dose Tracking  . Oxaliplatin: 607.875 mg/m2 (1,045 mg) = 101.31 % of the maximum lifetime dose of 600 mg/m2  o   Toxicity Monitoring/Prevention: . The patient has the following take home antiemetics prescribed: Ondansetron and Dexamethasone . The patient has the following take home medications prescribed: N/A . Medication allergies and previous infusion related reactions, if applicable, have been reviewed and addressed. Yes . The patient's current medication list has been assessed for drug-drug interactions with their chemotherapy regimen. no significant drug-drug interactions were identified on review.  Order Review: . Are the  treatment plan orders signed? Yes . Is the patient scheduled to see a provider prior to their treatment? No  I verify that I have reviewed each item in the above checklist and answered each question accordingly.  Romualdo Bolk, [place]

## 2021-01-07 NOTE — Telephone Encounter (Signed)
Left message reporting she is out of her oxycodone and is in a lot of pain.  Office has been talking with insurance company who will only cover #12 tablets/day. Dr. Benay Spice changed to 20 mg tab with dose being 1.5 tablets now, thus only 9 tabs/day. [place] and confirmed they have the medication in stock and it will be ready today-they will call her when ready. Left VM for patient with the above information.

## 2021-01-08 ENCOUNTER — Other Ambulatory Visit: Payer: Medicare Other

## 2021-01-08 ENCOUNTER — Inpatient Hospital Stay: Payer: Medicare Other

## 2021-01-08 ENCOUNTER — Ambulatory Visit: Payer: Medicare Other

## 2021-01-08 ENCOUNTER — Other Ambulatory Visit: Payer: Self-pay

## 2021-01-08 VITALS — BP 182/76 | HR 55 | Temp 98.2°F | Resp 20

## 2021-01-08 DIAGNOSIS — C250 Malignant neoplasm of head of pancreas: Secondary | ICD-10-CM

## 2021-01-08 DIAGNOSIS — Z95828 Presence of other vascular implants and grafts: Secondary | ICD-10-CM

## 2021-01-08 DIAGNOSIS — Z5111 Encounter for antineoplastic chemotherapy: Secondary | ICD-10-CM | POA: Diagnosis not present

## 2021-01-08 LAB — CMP (CANCER CENTER ONLY)
ALT: 14 U/L (ref 0–44)
AST: 20 U/L (ref 15–41)
Albumin: 3.4 g/dL — ABNORMAL LOW (ref 3.5–5.0)
Alkaline Phosphatase: 141 U/L — ABNORMAL HIGH (ref 38–126)
Anion gap: 8 (ref 5–15)
BUN: 13 mg/dL (ref 8–23)
CO2: 23 mmol/L (ref 22–32)
Calcium: 8.4 mg/dL — ABNORMAL LOW (ref 8.9–10.3)
Chloride: 107 mmol/L (ref 98–111)
Creatinine: 0.59 mg/dL (ref 0.44–1.00)
GFR, Estimated: >60 mL/min (ref >60)
Glucose, Bld: 107 mg/dL — ABNORMAL HIGH (ref 70–99)
Potassium: 4.2 mmol/L (ref 3.5–5.1)
Sodium: 138 mmol/L (ref 135–145)
Total Bilirubin: 0.2 mg/dL — ABNORMAL LOW (ref 0.3–1.2)
Total Protein: 6 g/dL — ABNORMAL LOW (ref 6.5–8.1)

## 2021-01-08 LAB — CBC WITH DIFFERENTIAL (CANCER CENTER ONLY)
Abs Immature Granulocytes: 0.11 10*3/uL — ABNORMAL HIGH (ref 0.00–0.07)
Basophils Absolute: 0 10*3/uL (ref 0.0–0.1)
Basophils Relative: 0 %
Eosinophils Absolute: 0.2 10*3/uL (ref 0.0–0.5)
Eosinophils Relative: 3 %
HCT: 31.6 % — ABNORMAL LOW (ref 36.0–46.0)
Hemoglobin: 10.5 g/dL — ABNORMAL LOW (ref 12.0–15.0)
Immature Granulocytes: 1 %
Lymphocytes Relative: 17 %
Lymphs Abs: 1.7 10*3/uL (ref 0.7–4.0)
MCH: 29.4 pg (ref 26.0–34.0)
MCHC: 33.2 g/dL (ref 30.0–36.0)
MCV: 88.5 fL (ref 80.0–100.0)
Monocytes Absolute: 0.5 10*3/uL (ref 0.1–1.0)
Monocytes Relative: 6 %
Neutro Abs: 7.1 10*3/uL (ref 1.7–7.7)
Neutrophils Relative %: 73 %
Platelet Count: 301 10*3/uL (ref 150–400)
RBC: 3.57 MIL/uL — ABNORMAL LOW (ref 3.87–5.11)
RDW: 17.9 % — ABNORMAL HIGH (ref 11.5–15.5)
WBC Count: 9.7 10*3/uL (ref 4.0–10.5)
nRBC: 0 % (ref 0.0–0.2)

## 2021-01-08 MED ORDER — MORPHINE SULFATE (PF) 2 MG/ML IV SOLN
INTRAVENOUS | Status: AC
  Filled 2021-01-08: qty 1

## 2021-01-08 MED ORDER — MORPHINE SULFATE (PF) 2 MG/ML IV SOLN
2.0000 mg | Freq: Once | INTRAVENOUS | Status: AC
Start: 2021-01-08 — End: 2021-01-08
  Administered 2021-01-08: 1 mg via INTRAVENOUS

## 2021-01-08 MED ORDER — PROCHLORPERAZINE MALEATE 10 MG PO TABS
ORAL_TABLET | ORAL | Status: AC
  Filled 2021-01-08: qty 1

## 2021-01-08 MED ORDER — SODIUM CHLORIDE 0.9% FLUSH
10.0000 mL | INTRAVENOUS | Status: DC | PRN
  Administered 2021-01-08: 10 mL
  Filled 2021-01-08: qty 10

## 2021-01-08 MED ORDER — HEPARIN SOD (PORK) LOCK FLUSH 100 UNIT/ML IV SOLN
500.0000 [IU] | Freq: Once | INTRAVENOUS | Status: AC | PRN
  Administered 2021-01-08: 500 [IU]
  Filled 2021-01-08: qty 5

## 2021-01-08 MED ORDER — SODIUM CHLORIDE 0.9 % IV SOLN
800.0000 mg/m2 | Freq: Once | INTRAVENOUS | Status: AC
  Administered 2021-01-08: 1254 mg via INTRAVENOUS
  Filled 2021-01-08: qty 32.98

## 2021-01-08 MED ORDER — SODIUM CHLORIDE 0.9% FLUSH
10.0000 mL | Freq: Once | INTRAVENOUS | Status: AC
  Administered 2021-01-08: 10 mL
  Filled 2021-01-08: qty 10

## 2021-01-08 MED ORDER — SODIUM CHLORIDE 0.9 % IV SOLN
Freq: Once | INTRAVENOUS | Status: AC
  Filled 2021-01-08: qty 250

## 2021-01-08 MED ORDER — PROCHLORPERAZINE MALEATE 10 MG PO TABS
10.0000 mg | ORAL_TABLET | Freq: Once | ORAL | Status: AC
  Administered 2021-01-08: 10 mg via ORAL

## 2021-01-08 MED ORDER — MORPHINE SULFATE (PF) 2 MG/ML IV SOLN
1.0000 mg | Freq: Once | INTRAVENOUS | Status: AC
  Administered 2021-01-08: 1 mg via INTRAVENOUS

## 2021-01-08 MED ORDER — PACLITAXEL PROTEIN-BOUND CHEMO INJECTION 100 MG
100.0000 mg/m2 | Freq: Once | INTRAVENOUS | Status: AC
  Administered 2021-01-08: 150 mg via INTRAVENOUS
  Filled 2021-01-08: qty 30

## 2021-01-08 NOTE — Patient Instructions (Addendum)
[facility] Discharge Instructions for Patients Receiving Chemotherapy  Today you received the following chemotherapy agents: gemcitabine and abraxane.  To help prevent nausea and vomiting after your treatment, we encourage you to take your nausea medication as directed.   If you develop nausea and vomiting that is not controlled by your nausea medication, call the clinic.   BELOW ARE SYMPTOMS THAT SHOULD BE REPORTED IMMEDIATELY:  *FEVER GREATER THAN 100.5 F  *CHILLS WITH OR WITHOUT FEVER  NAUSEA AND VOMITING THAT IS NOT CONTROLLED WITH YOUR NAUSEA MEDICATION  *UNUSUAL SHORTNESS OF BREATH  *UNUSUAL BRUISING OR BLEEDING  TENDERNESS IN MOUTH AND THROAT WITH OR WITHOUT PRESENCE OF ULCERS  *URINARY PROBLEMS  *BOWEL PROBLEMS  UNUSUAL RASH Items with * indicate a potential emergency and should be followed up as soon as possible.  Feel free to call the clinic should you have any questions or concerns. The clinic phone number is [phone].  Please show the [facility] at check-in to the Emergency Department and triage nurse.  Gemcitabine injection What is this medicine? GEMCITABINE (jem SYE ta been) is a chemotherapy drug. This medicine is used to treat many types of cancer like breast cancer, lung cancer, pancreatic cancer, and ovarian cancer. This medicine may be used for other purposes; ask your health care provider or pharmacist if you have questions. COMMON BRAND NAME(S): Gemzar, Infugem What should I tell my health care provider before I take this medicine? They need to know if you have any of these conditions:  blood disorders  infection  kidney disease  liver disease  lung or breathing disease, like asthma  recent or ongoing radiation therapy  an unusual or allergic reaction to gemcitabine, other chemotherapy, other medicines, foods, dyes, or preservatives  pregnant or trying to get pregnant  breast-feeding How should I use this  medicine? This drug is given as an infusion into a vein. It is administered in a hospital or clinic by a specially trained health care professional. Talk to your pediatrician regarding the use of this medicine in children. Special care may be needed. Overdosage: If you think you have taken too much of this medicine contact a poison control center or emergency room at once. NOTE: This medicine is only for you. Do not share this medicine with others. What if I miss a dose? It is important not to miss your dose. Call your doctor or health care professional if you are unable to keep an appointment. What may interact with this medicine?  medicines to increase blood counts like filgrastim, pegfilgrastim, sargramostim  some other chemotherapy drugs like cisplatin  vaccines Talk to your doctor or health care professional before taking any of these medicines:  acetaminophen  aspirin  ibuprofen  ketoprofen  naproxen This list may not describe all possible interactions. Give your health care provider a list of all the medicines, herbs, non-prescription drugs, or dietary supplements you use. Also tell them if you smoke, drink alcohol, or use illegal drugs. Some items may interact with your medicine. What should I watch for while using this medicine? Visit your doctor for checks on your progress. This drug may make you feel generally unwell. This is not uncommon, as chemotherapy can affect healthy cells as well as cancer cells. Report any side effects. Continue your course of treatment even though you feel ill unless your doctor tells you to stop. In some cases, you may be given additional medicines to help with side effects. Follow all directions for their  use. Call your doctor or health care professional for advice if you get a fever, chills or sore throat, or other symptoms of a cold or flu. Do not treat yourself. This drug decreases your body's ability to fight infections. Try to avoid being  around people who are sick. This medicine may increase your risk to bruise or bleed. Call your doctor or health care professional if you notice any unusual bleeding. Be careful brushing and flossing your teeth or using a toothpick because you may get an infection or bleed more easily. If you have any dental work done, tell your dentist you are receiving this medicine. Avoid taking products that contain aspirin, acetaminophen, ibuprofen, naproxen, or ketoprofen unless instructed by your doctor. These medicines may hide a fever. Do not become pregnant while taking this medicine or for 6 months after stopping it. Women should inform their doctor if they wish to become pregnant or think they might be pregnant. Men should not father a child while taking this medicine and for 3 months after stopping it. There is a potential for serious side effects to an unborn child. Talk to your health care professional or pharmacist for more information. Do not breast-feed an infant while taking this medicine or for at least 1 week after stopping it. Men should inform their doctors if they wish to father a child. This medicine may lower sperm counts. Talk with your doctor or health care professional if you are concerned about your fertility. What side effects may I notice from receiving this medicine? Side effects that you should report to your doctor or health care professional as soon as possible:  allergic reactions like skin rash, itching or hives, swelling of the face, lips, or tongue  breathing problems  pain, redness, or irritation at site where injected  signs and symptoms of a dangerous change in heartbeat or heart rhythm like chest pain; dizziness; fast or irregular heartbeat; palpitations; feeling faint or lightheaded, falls; breathing problems  signs of decreased platelets or bleeding - bruising, pinpoint red spots on the skin, black, tarry stools, blood in the urine  signs of decreased red blood cells -  unusually weak or tired, feeling faint or lightheaded, falls  signs of infection - fever or chills, cough, sore throat, pain or difficulty passing urine  signs and symptoms of kidney injury like trouble passing urine or change in the amount of urine  signs and symptoms of liver injury like dark yellow or brown urine; general ill feeling or flu-like symptoms; light-colored stools; loss of appetite; nausea; right upper belly pain; unusually weak or tired; yellowing of the eyes or skin  swelling of ankles, feet, hands Side effects that usually do not require medical attention (report to your doctor or health care professional if they continue or are bothersome):  constipation  diarrhea  hair loss  loss of appetite  nausea  rash  vomiting This list may not describe all possible side effects. Call your doctor for medical advice about side effects. You may report side effects to FDA at 1-800-FDA-1088. Where should I keep my medicine? This drug is given in a hospital or clinic and will not be stored at home. NOTE: This sheet is a summary. It may not cover all possible information. If you have questions about this medicine, talk to your doctor, pharmacist, or health care provider.  2021 Elsevier/Gold Standard (2018-02-09 18:06:11)  Nanoparticle Albumin-Bound Paclitaxel injection What is this medicine? NANOPARTICLE ALBUMIN-BOUND PACLITAXEL (Na no PAHR ti kuhl al BYOO  muhn-bound PAK li TAX el) is a chemotherapy drug. It targets fast dividing cells, like cancer cells, and causes these cells to die. This medicine is used to treat advanced breast cancer, lung cancer, and pancreatic cancer. This medicine may be used for other purposes; ask your health care provider or pharmacist if you have questions. COMMON BRAND NAME(S): Abraxane What should I tell my health care provider before I take this medicine? They need to know if you have any of these conditions:  kidney disease  liver  disease  low blood counts, like low white cell, platelet, or red cell counts  lung or breathing disease, like asthma  tingling of the fingers or toes, or other nerve disorder  an unusual or allergic reaction to paclitaxel, albumin, other chemotherapy, other medicines, foods, dyes, or preservatives  pregnant or trying to get pregnant  breast-feeding How should I use this medicine? This drug is given as an infusion into a vein. It is administered in a hospital or clinic by a specially trained health care professional. Talk to your pediatrician regarding the use of this medicine in children. Special care may be needed. Overdosage: If you think you have taken too much of this medicine contact a poison control center or emergency room at once. NOTE: This medicine is only for you. Do not share this medicine with others. What if I miss a dose? It is important not to miss your dose. Call your doctor or health care professional if you are unable to keep an appointment. What may interact with this medicine? This medicine may interact with the following medications:  antiviral medicines for hepatitis, HIV or AIDS  certain antibiotics like erythromycin and clarithromycin  certain medicines for fungal infections like ketoconazole and itraconazole  certain medicines for seizures like carbamazepine, phenobarbital, phenytoin  gemfibrozil  nefazodone  rifampin  St. John's wort This list may not describe all possible interactions. Give your health care provider a list of all the medicines, herbs, non-prescription drugs, or dietary supplements you use. Also tell them if you smoke, drink alcohol, or use illegal drugs. Some items may interact with your medicine. What should I watch for while using this medicine? Your condition will be monitored carefully while you are receiving this medicine. You will need important blood work done while you are taking this medicine. This medicine can cause  serious allergic reactions. If you experience allergic reactions like skin rash, itching or hives, swelling of the face, lips, or tongue, tell your doctor or health care professional right away. In some cases, you may be given additional medicines to help with side effects. Follow all directions for their use. This drug may make you feel generally unwell. This is not uncommon, as chemotherapy can affect healthy cells as well as cancer cells. Report any side effects. Continue your course of treatment even though you feel ill unless your doctor tells you to stop. Call your doctor or health care professional for advice if you get a fever, chills or sore throat, or other symptoms of a cold or flu. Do not treat yourself. This drug decreases your body's ability to fight infections. Try to avoid being around people who are sick. This medicine may increase your risk to bruise or bleed. Call your doctor or health care professional if you notice any unusual bleeding. Be careful brushing and flossing your teeth or using a toothpick because you may get an infection or bleed more easily. If you have any dental work done, tell your dentist  you are receiving this medicine. Avoid taking products that contain aspirin, acetaminophen, ibuprofen, naproxen, or ketoprofen unless instructed by your doctor. These medicines may hide a fever. Do not become pregnant while taking this medicine or for 6 months after stopping it. Women should inform their doctor if they wish to become pregnant or think they might be pregnant. Men should not father a child while taking this medicine or for 3 months after stopping it. There is a potential for serious side effects to an unborn child. Talk to your health care professional or pharmacist for more information. Do not breast-feed an infant while taking this medicine or for 2 weeks after stopping it. This medicine may interfere with the ability to get pregnant or to father a child. You should  talk to your doctor or health care professional if you are concerned about your fertility. What side effects may I notice from receiving this medicine? Side effects that you should report to your doctor or health care professional as soon as possible:  allergic reactions like skin rash, itching or hives, swelling of the face, lips, or tongue  breathing problems  changes in vision  fast, irregular heartbeat  low blood pressure  mouth sores  pain, tingling, numbness in the hands or feet  signs of decreased platelets or bleeding - bruising, pinpoint red spots on the skin, black, tarry stools, blood in the urine  signs of decreased red blood cells - unusually weak or tired, feeling faint or lightheaded, falls  signs of infection - fever or chills, cough, sore throat, pain or difficulty passing urine  signs and symptoms of liver injury like dark yellow or brown urine; general ill feeling or flu-like symptoms; light-colored stools; loss of appetite; nausea; right upper belly pain; unusually weak or tired; yellowing of the eyes or skin  swelling of the ankles, feet, hands  unusually slow heartbeat Side effects that usually do not require medical attention (report to your doctor or health care professional if they continue or are bothersome):  diarrhea  hair loss  loss of appetite  nausea, vomiting  tiredness This list may not describe all possible side effects. Call your doctor for medical advice about side effects. You may report side effects to FDA at 1-800-FDA-1088. Where should I keep my medicine? This drug is given in a hospital or clinic and will not be stored at home. NOTE: This sheet is a summary. It may not cover all possible information. If you have questions about this medicine, talk to your doctor, pharmacist, or health care provider.  2021 Elsevier/Gold Standard (2017-07-20 13:03:45)

## 2021-01-08 NOTE — Progress Notes (Signed)
On arrival, patient c/o acute exacerbation of back pain. States this pain is the same pain she has been experiencing since onset of oncology diagnosis. Reported she took her oral pain medications prior to arrival and did not want further intervention. At multiple times during her treatment, she c/o persistent back pain and patient was in obvious discomfort. Patient agreed to have a provider evaluate her. [name], PA-C saw her in the infusion room and medication orders received, repeated, and confirmed. Patient agreed to plan of care. Medicated as documented in [facility]. Patient received no relief with initial dose. [name], PA-C advised to repeat morphine 1mg  IV. At time of discharge, patient verbalized improvement in symptoms and was ready for discharge. Assisted patient to car with no further incident.

## 2021-01-08 NOTE — Progress Notes (Signed)
This RN witnessed [name], Therapist, sports, administer a total of 2MG  IV morphine to this patient in two separate doses. No waste was required as the whole vial was used for patient.

## 2021-01-09 ENCOUNTER — Telehealth: Payer: Self-pay | Admitting: *Deleted

## 2021-01-09 LAB — CANCER ANTIGEN 19-9: CA 19-9: 110 U/mL — ABNORMAL HIGH (ref 0–35)

## 2021-01-15 ENCOUNTER — Telehealth: Payer: Self-pay | Admitting: *Deleted

## 2021-01-15 DIAGNOSIS — C250 Malignant neoplasm of head of pancreas: Secondary | ICD-10-CM

## 2021-01-15 NOTE — Telephone Encounter (Signed)
Scheduled for surgery 01/23/21. Asking if chemo can be delayed till 01/29/21? OK per Dr. Nehemiah Massed Dr. Redmond Baseman office. Also called patient to confirm she is on board with this and she agrees. Scheduling message sent to move appointments from 2/23 to 01/29/21.

## 2021-01-16 ENCOUNTER — Telehealth: Payer: Self-pay | Admitting: Oncology

## 2021-01-16 NOTE — Telephone Encounter (Signed)
R/s apt per 2/16 sch msg - pt is aware of appt on 2/23 moved to 3/3

## 2021-01-17 ENCOUNTER — Other Ambulatory Visit: Payer: Self-pay | Admitting: Nurse Practitioner

## 2021-01-17 DIAGNOSIS — C250 Malignant neoplasm of head of pancreas: Secondary | ICD-10-CM

## 2021-01-17 NOTE — Telephone Encounter (Signed)
For your approval or refusal. Less Woolsey M Matteo Banke, RN  

## 2021-01-19 ENCOUNTER — Other Ambulatory Visit: Payer: Self-pay | Admitting: Oncology

## 2021-01-20 ENCOUNTER — Encounter: Payer: Self-pay | Admitting: *Deleted

## 2021-01-20 ENCOUNTER — Other Ambulatory Visit: Payer: Self-pay | Admitting: Nurse Practitioner

## 2021-01-20 DIAGNOSIS — C250 Malignant neoplasm of head of pancreas: Secondary | ICD-10-CM

## 2021-01-20 MED ORDER — MORPHINE SULFATE ER 100 MG PO TBCR: 100.0000 mg | EXTENDED_RELEASE_TABLET | Freq: Three times a day (TID) | ORAL | 0 refills | Status: DC

## 2021-01-20 MED FILL — MORPHINE SULF ER 100 MG TAB: 100 | 30 days supply | Qty: 90 | Fill #0

## 2021-01-20 NOTE — Progress Notes (Signed)
Needs refill on MS Contin saying she only has #1 pill left. She called [facility] on 2/17 to request the refill. She will need refill on her oxycodone by 2/27.

## 2021-01-21 ENCOUNTER — Other Ambulatory Visit (HOSPITAL_COMMUNITY)
Admission: RE | Admit: 2021-01-21 | Discharge: 2021-01-21 | Disposition: A | Discharge status: Home / Self Care | Payer: Medicare Other | Source: Ambulatory Visit | Attending: Otolaryngology | Admitting: Otolaryngology

## 2021-01-21 DIAGNOSIS — Z20822 Contact with and (suspected) exposure to covid-19: Secondary | ICD-10-CM | POA: Insufficient documentation

## 2021-01-21 DIAGNOSIS — Z01812 Encounter for preprocedural laboratory examination: Secondary | ICD-10-CM | POA: Diagnosis present

## 2021-01-21 LAB — SARS CORONAVIRUS 2 (TAT 6-24 HRS): SARS Coronavirus 2: NEGATIVE

## 2021-01-22 ENCOUNTER — Encounter (HOSPITAL_COMMUNITY): Payer: Self-pay | Admitting: Otolaryngology

## 2021-01-22 ENCOUNTER — Other Ambulatory Visit: Payer: Medicare Other

## 2021-01-22 ENCOUNTER — Ambulatory Visit: Payer: Medicare Other

## 2021-01-22 ENCOUNTER — Ambulatory Visit: Payer: Medicare Other | Admitting: Oncology

## 2021-01-22 ENCOUNTER — Other Ambulatory Visit: Payer: Self-pay

## 2021-01-22 NOTE — Anesthesia Preprocedure Evaluation (Addendum)
Anesthesia Evaluation  Patient identified by MRN, date of birth, ID band Patient awake    Reviewed: Allergy & Precautions, NPO status , Patient's Chart, lab work & pertinent test results  Airway Mallampati: II  TM Distance: >3 FB Neck ROM: Full    Dental  (+) Teeth Intact, Dental Advisory Given   Pulmonary former smoker,    breath sounds clear to auscultation       Cardiovascular hypertension,  Rhythm:Regular Rate:Normal     Neuro/Psych    GI/Hepatic   Endo/Other    Renal/GU      Musculoskeletal   Abdominal Normal abdominal exam  (+)   Peds  Hematology   Anesthesia Other Findings   Reproductive/Obstetrics                            Anesthesia Physical Anesthesia Plan  ASA: II  Anesthesia Plan: General   Post-op Pain Management:    Induction: Intravenous  PONV Risk Score and Plan: 4 or greater and TIVA, Ondansetron, Midazolam, Dexamethasone and Treatment may vary due to age or medical condition  Airway Management Planned:   Additional Equipment: None  Intra-op Plan:   Post-operative Plan:   Informed Consent: I have reviewed the patients History and Physical, chart, labs and discussed the procedure including the risks, benefits and alternatives for the proposed anesthesia with the patient or authorized representative who has indicated his/her understanding and acceptance.       Plan Discussed with: CRNA  Anesthesia Plan Comments: (=PAT note by Karoline Caldwell, PA-C: Follows yearly with cardiology for history of HTN, HLD, CAD s/p BMS to RCA in 2011 (presented with inferior STEMI/VF arrest).  Low risk stress test 03/2017.  Last seen 11/29/2019 and doing well from a cardiac standpoint at that time, no anginal complaints.  No changes made to medical management, advised to continue yearly follow-up.  Follows with hematology/oncology for treatment of pancreatic cancer.  She is currently  undergoing chemotherapy.  Recently evaluated by ENT Dr. Redmond Baseman for dysphonia.  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  She will need day of surgery labs and evaluation.  EKG 04/01/2020: Sinus rhythm.  Rate 60.  CT chest abdomen pelvis 11/14/2020: IMPRESSION: 1. No evidence of thoracic metastasis. 2. Mass in the pancreatic head measures slightly larger than comparison CT 09/06/2020. Mild increase in biliary pancreatic duct dilatation. 3. Interval increase in size periaortic retroperitoneal metastatic lymph node with indistinct margins concerning for extracapsular extension. 4. No evidence of liver metastasis. Biliary stent in place with no biliary duct dilatation.  Nuclear stress 04/16/2017: The left ventricular ejection fraction is normal (55-65%). Nuclear stress EF: 62%. There was no ST segment deviation noted during stress. Defect 1: There is a small defect of moderate severity present in the apex location. Defect 2: There is a medium defect of moderate severity present in the basal inferoseptal location. This is a low risk study.   Low risk stress nuclear study with apical thinning and prominent thinning of the basal inferoseptal wall; no ischemia; EF 62 with normal wall motion.  )       Anesthesia Quick Evaluation

## 2021-01-22 NOTE — Progress Notes (Signed)
Anesthesia Chart Review: Same day workup  Follows yearly with cardiology for history of HTN, HLD, CAD s/p BMS to RCA in 2011 (presented with inferior STEMI/VF arrest).  Low risk stress test 03/2017.  Last seen 11/29/2019 and doing well from a cardiac standpoint at that time, no anginal complaints.  No changes made to medical management, advised to continue yearly follow-up.  Follows with hematology/oncology for treatment of pancreatic cancer.  She is currently undergoing chemotherapy.  Recently evaluated by ENT Dr. Redmond Baseman for dysphonia.  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  She will need day of surgery labs and evaluation.  EKG 04/01/2020: Sinus rhythm.  Rate 60.  CT chest abdomen pelvis 11/14/2020: IMPRESSION: 1. No evidence of thoracic metastasis. 2. Mass in the pancreatic head measures slightly larger than comparison CT 09/06/2020. Mild increase in biliary pancreatic duct dilatation. 3. Interval increase in size periaortic retroperitoneal metastatic lymph node with indistinct margins concerning for extracapsular extension. 4. No evidence of liver metastasis. Biliary stent in place with no biliary duct dilatation.  Nuclear stress 04/16/2017:  The left ventricular ejection fraction is normal (55-65%).  Nuclear stress EF: 62%.  There was no ST segment deviation noted during stress.  Defect 1: There is a small defect of moderate severity present in the apex location.  Defect 2: There is a medium defect of moderate severity present in the basal inferoseptal location.  This is a low risk study.   Low risk stress nuclear study with apical thinning and prominent thinning of the basal inferoseptal wall; no ischemia; EF 62 with normal wall motion.   Wynonia Musty [facility] Short Stay Center/Anesthesiology Phone [phone] 01/22/2021 1:06 PM

## 2021-01-22 NOTE — Progress Notes (Signed)
PCP - Dr Annye Asa Cardiologist - Dr Quay Burow Oncology - Dr Betsy Coder  Chest x-/ay - CT Chest 11/14/20 EKG - 04/01/20 Stress Test - 04/16/17 ECHO - 05/06/10 Cardiac Cath - 02/15/10  Blood Thinner Instructions:  Last dose of plavix was on 01/22/21.  Aspirin Instructions: Last dose of ASA was on 01/19/21.  Anesthesia review: Yes  STOP now taking any Aspirin (unless otherwise instructed by your surgeon), Aleve, Naproxen, Ibuprofen, Motrin, Advil, Goody's, BC's, all herbal medications, fish oil, and all vitamins.   Coronavirus Screening Covid test on 01/21/21 was negative. Do you have any of the following symptoms:  Cough yes/no: No Fever (>100.65F)  yes/no: No Runny nose yes/no: No Sore throat yes/no: No Difficulty breathing/shortness of breath  yes/no: No  Have you traveled in the last 14 days and where? yes/no: No  Patient verbalized understanding of instructions that were given via phone.

## 2021-01-23 ENCOUNTER — Ambulatory Visit (HOSPITAL_COMMUNITY): Payer: Medicare Other | Admitting: Physician Assistant

## 2021-01-23 ENCOUNTER — Ambulatory Visit (HOSPITAL_COMMUNITY)
Admission: RE | Admit: 2021-01-23 | Discharge: 2021-01-23 | Disposition: A | Discharge status: Home / Self Care | Payer: Medicare Other | Attending: Otolaryngology | Admitting: Otolaryngology

## 2021-01-23 ENCOUNTER — Encounter (HOSPITAL_COMMUNITY)
Admission: RE | Disposition: A | Discharge status: Home / Self Care | Payer: Self-pay | Source: Home / Self Care | Attending: Otolaryngology

## 2021-01-23 ENCOUNTER — Encounter (HOSPITAL_COMMUNITY): Payer: Self-pay | Admitting: Otolaryngology

## 2021-01-23 DIAGNOSIS — I2510 Atherosclerotic heart disease of native coronary artery without angina pectoris: Secondary | ICD-10-CM | POA: Insufficient documentation

## 2021-01-23 DIAGNOSIS — Z803 Family history of malignant neoplasm of breast: Secondary | ICD-10-CM | POA: Insufficient documentation

## 2021-01-23 DIAGNOSIS — Z955 Presence of coronary angioplasty implant and graft: Secondary | ICD-10-CM | POA: Diagnosis not present

## 2021-01-23 DIAGNOSIS — Z8674 Personal history of sudden cardiac arrest: Secondary | ICD-10-CM | POA: Diagnosis not present

## 2021-01-23 DIAGNOSIS — Z90722 Acquired absence of ovaries, bilateral: Secondary | ICD-10-CM | POA: Diagnosis not present

## 2021-01-23 DIAGNOSIS — Z91041 Radiographic dye allergy status: Secondary | ICD-10-CM | POA: Diagnosis not present

## 2021-01-23 DIAGNOSIS — C259 Malignant neoplasm of pancreas, unspecified: Secondary | ICD-10-CM | POA: Diagnosis not present

## 2021-01-23 DIAGNOSIS — J3801 Paralysis of vocal cords and larynx, unilateral: Secondary | ICD-10-CM | POA: Diagnosis not present

## 2021-01-23 DIAGNOSIS — Z888 Allergy status to other drugs, medicaments and biological substances status: Secondary | ICD-10-CM | POA: Diagnosis not present

## 2021-01-23 DIAGNOSIS — I252 Old myocardial infarction: Secondary | ICD-10-CM | POA: Diagnosis not present

## 2021-01-23 DIAGNOSIS — Z87891 Personal history of nicotine dependence: Secondary | ICD-10-CM | POA: Diagnosis not present

## 2021-01-23 DIAGNOSIS — Z7982 Long term (current) use of aspirin: Secondary | ICD-10-CM | POA: Insufficient documentation

## 2021-01-23 DIAGNOSIS — I10 Essential (primary) hypertension: Secondary | ICD-10-CM | POA: Diagnosis not present

## 2021-01-23 DIAGNOSIS — Z8249 Family history of ischemic heart disease and other diseases of the circulatory system: Secondary | ICD-10-CM | POA: Diagnosis not present

## 2021-01-23 DIAGNOSIS — R490 Dysphonia: Secondary | ICD-10-CM | POA: Diagnosis not present

## 2021-01-23 DIAGNOSIS — Z90710 Acquired absence of both cervix and uterus: Secondary | ICD-10-CM | POA: Insufficient documentation

## 2021-01-23 DIAGNOSIS — Z79899 Other long term (current) drug therapy: Secondary | ICD-10-CM | POA: Diagnosis not present

## 2021-01-23 DIAGNOSIS — E785 Hyperlipidemia, unspecified: Secondary | ICD-10-CM | POA: Diagnosis not present

## 2021-01-23 HISTORY — DX: Pneumonia, unspecified organism: J18.9

## 2021-01-23 HISTORY — DX: Dyspnea, unspecified: R06.00

## 2021-01-23 HISTORY — PX: MICROLARYNGOSCOPY WITH CO2 LASER AND EXCISION OF VOCAL CORD LESION: SHX5970

## 2021-01-23 HISTORY — DX: Gastro-esophageal reflux disease without esophagitis: K21.9

## 2021-01-23 LAB — BASIC METABOLIC PANEL
Anion gap: 9 (ref 5–15)
BUN: 8 mg/dL (ref 8–23)
CO2: 25 mmol/L (ref 22–32)
Calcium: 8.5 mg/dL — ABNORMAL LOW (ref 8.9–10.3)
Chloride: 104 mmol/L (ref 98–111)
Creatinine, Ser: 0.47 mg/dL (ref 0.44–1.00)
GFR, Estimated: >60 mL/min (ref >60)
Glucose, Bld: 102 mg/dL — ABNORMAL HIGH (ref 70–99)
Potassium: 3.7 mmol/L (ref 3.5–5.1)
Sodium: 138 mmol/L (ref 135–145)

## 2021-01-23 LAB — CBC
HCT: 32.8 % — ABNORMAL LOW (ref 36.0–46.0)
Hemoglobin: 10.5 g/dL — ABNORMAL LOW (ref 12.0–15.0)
MCH: 30.2 pg (ref 26.0–34.0)
MCHC: 32 g/dL (ref 30.0–36.0)
MCV: 94.3 fL (ref 80.0–100.0)
Platelets: 347 10*3/uL (ref 150–400)
RBC: 3.48 MIL/uL — ABNORMAL LOW (ref 3.87–5.11)
RDW: 17.3 % — ABNORMAL HIGH (ref 11.5–15.5)
WBC: 4.6 10*3/uL (ref 4.0–10.5)
nRBC: 0 % (ref 0.0–0.2)

## 2021-01-23 SURGERY — MICROLARYNGOSCOPY WITH CO2 LASER AND EXCISION OF VOCAL CORD LESION
Anesthesia: General

## 2021-01-23 MED ORDER — AMISULPRIDE (ANTIEMETIC) 5 MG/2ML IV SOLN: 10.0000 mg | Freq: Once | INTRAVENOUS | Status: DC | PRN

## 2021-01-23 MED ORDER — FENTANYL CITRATE (PF) 250 MCG/5ML IJ SOLN
INTRAMUSCULAR | Status: DC | PRN
  Administered 2021-01-23 (×2): 100 ug via INTRAVENOUS

## 2021-01-23 MED ORDER — ROCURONIUM BROMIDE 100 MG/10ML IV SOLN
INTRAVENOUS | Status: DC | PRN
  Administered 2021-01-23: 30 mg via INTRAVENOUS

## 2021-01-23 MED ORDER — ONDANSETRON HCL 4 MG/2ML IJ SOLN
INTRAMUSCULAR | Status: AC
  Filled 2021-01-23: qty 2

## 2021-01-23 MED ORDER — ROCURONIUM BROMIDE 10 MG/ML (PF) SYRINGE
PREFILLED_SYRINGE | INTRAVENOUS | Status: AC
  Filled 2021-01-23: qty 10

## 2021-01-23 MED ORDER — LACTATED RINGERS IV SOLN: INTRAVENOUS | Status: DC

## 2021-01-23 MED ORDER — EPINEPHRINE 1 MG/10ML IJ SOSY
PREFILLED_SYRINGE | INTRAMUSCULAR | Status: AC
  Filled 2021-01-23: qty 10

## 2021-01-23 MED ORDER — ACETAMINOPHEN 160 MG/5ML PO SOLN
325.0000 mg | Freq: Once | ORAL | Status: AC | PRN
  Administered 2021-01-23: 650 mg via ORAL

## 2021-01-23 MED ORDER — FENTANYL CITRATE (PF) 100 MCG/2ML IJ SOLN
25.0000 ug | INTRAMUSCULAR | Status: DC | PRN
Start: 2021-01-23 — End: 2021-01-23
  Administered 2021-01-23 (×2): 25 ug via INTRAVENOUS

## 2021-01-23 MED ORDER — SUGAMMADEX SODIUM 200 MG/2ML IV SOLN
INTRAVENOUS | Status: DC | PRN
  Administered 2021-01-23: 200 mg via INTRAVENOUS

## 2021-01-23 MED ORDER — PROPOFOL 10 MG/ML IV BOLUS
INTRAVENOUS | Status: AC
  Filled 2021-01-23: qty 20

## 2021-01-23 MED ORDER — ONDANSETRON HCL 4 MG/2ML IJ SOLN
INTRAMUSCULAR | Status: DC | PRN
  Administered 2021-01-23: 4 mg via INTRAVENOUS

## 2021-01-23 MED ORDER — DEXAMETHASONE SODIUM PHOSPHATE 10 MG/ML IJ SOLN
INTRAMUSCULAR | Status: DC | PRN
  Administered 2021-01-23: 10 mg via INTRAVENOUS

## 2021-01-23 MED ORDER — MIDAZOLAM HCL 5 MG/5ML IJ SOLN
INTRAMUSCULAR | Status: DC | PRN
  Administered 2021-01-23 (×2): 2 mg via INTRAVENOUS

## 2021-01-23 MED ORDER — MEPERIDINE HCL 25 MG/ML IJ SOLN: 6.2500 mg | INTRAMUSCULAR | Status: DC | PRN

## 2021-01-23 MED ORDER — LIDOCAINE 2% (20 MG/ML) 5 ML SYRINGE
INTRAMUSCULAR | Status: DC | PRN
  Administered 2021-01-23: 60 mg via INTRAVENOUS

## 2021-01-23 MED ORDER — STERILE WATER FOR IRRIGATION IR SOLN
Status: DC | PRN
  Administered 2021-01-23: 1000 mL

## 2021-01-23 MED ORDER — LIDOCAINE 2% (20 MG/ML) 5 ML SYRINGE
INTRAMUSCULAR | Status: AC
  Filled 2021-01-23: qty 5

## 2021-01-23 MED ORDER — ACETAMINOPHEN 325 MG PO TABS: 325.0000 mg | ORAL_TABLET | Freq: Once | ORAL | Status: AC | PRN

## 2021-01-23 MED ORDER — MIDAZOLAM HCL 2 MG/2ML IJ SOLN
INTRAMUSCULAR | Status: AC
  Filled 2021-01-23: qty 2

## 2021-01-23 MED ORDER — ACETAMINOPHEN 10 MG/ML IV SOLN: 1000.0000 mg | Freq: Once | INTRAVENOUS | Status: DC | PRN

## 2021-01-23 MED ORDER — DEXAMETHASONE SODIUM PHOSPHATE 10 MG/ML IJ SOLN
INTRAMUSCULAR | Status: AC
  Filled 2021-01-23: qty 1

## 2021-01-23 MED ORDER — FENTANYL CITRATE (PF) 250 MCG/5ML IJ SOLN
INTRAMUSCULAR | Status: AC
  Filled 2021-01-23: qty 5

## 2021-01-23 MED ORDER — PROPOFOL 500 MG/50ML IV EMUL
INTRAVENOUS | Status: DC | PRN
  Administered 2021-01-23: 125 ug/kg/min via INTRAVENOUS

## 2021-01-23 MED ORDER — CHLORHEXIDINE GLUCONATE 0.12 % MT SOLN: 15.0000 mL | Freq: Once | OROMUCOSAL | Status: AC

## 2021-01-23 MED ORDER — ORAL CARE MOUTH RINSE
15.0000 mL | Freq: Once | OROMUCOSAL | Status: AC
  Administered 2021-01-23: 15 mL via OROMUCOSAL

## 2021-01-23 MED ORDER — EPINEPHRINE HCL (NASAL) 0.1 % NA SOLN
NASAL | Status: AC
  Filled 2021-01-23: qty 30

## 2021-01-23 MED ORDER — PROPOFOL 10 MG/ML IV BOLUS
INTRAVENOUS | Status: DC | PRN
  Administered 2021-01-23: 100 mg via INTRAVENOUS
  Administered 2021-01-23 (×2): 50 mg via INTRAVENOUS

## 2021-01-23 MED ORDER — ACETAMINOPHEN 160 MG/5ML PO SOLN
ORAL | Status: AC
  Filled 2021-01-23: qty 20.3

## 2021-01-23 MED ORDER — FENTANYL CITRATE (PF) 100 MCG/2ML IJ SOLN
INTRAMUSCULAR | Status: AC
  Filled 2021-01-23: qty 2

## 2021-01-23 SURGICAL SUPPLY — 34 items
BALLN PULM 12 13.5 15X75 (BALLOONS)
BALLN PULMONARY 10-12 (MISCELLANEOUS) IMPLANT
BALLOON PULM 12 13.5 15X75 (BALLOONS) IMPLANT
BLADE SURG 15 STRL LF DISP TIS (BLADE) IMPLANT
BLADE SURG 15 STRL SS (BLADE)
BNDG EYE OVAL (GAUZE/BANDAGES/DRESSINGS) ×4 IMPLANT
CANISTER SUCT 3000ML PPV (MISCELLANEOUS) ×2 IMPLANT
CNTNR URN SCR LID CUP LEK RST (MISCELLANEOUS) IMPLANT
CONT SPEC 4OZ STRL OR WHT (MISCELLANEOUS)
COVER BACK TABLE 60X90IN (DRAPES) ×2 IMPLANT
COVER MAYO STAND STRL (DRAPES) ×2 IMPLANT
COVER WAND RF STERILE (DRAPES) ×1 IMPLANT
DRAPE HALF SHEET 40X57 (DRAPES) ×2 IMPLANT
GAUZE SPONGE 4X4 12PLY STRL (GAUZE/BANDAGES/DRESSINGS) ×2 IMPLANT
GLOVE BIO SURGEON STRL SZ7.5 (GLOVE) ×2 IMPLANT
GOWN STRL REUS W/ TWL LRG LVL3 (GOWN DISPOSABLE) IMPLANT
GOWN STRL REUS W/TWL LRG LVL3 (GOWN DISPOSABLE)
KIT BASIN OR (CUSTOM PROCEDURE TRAY) ×2 IMPLANT
KIT PROLARN PLUS GEL W/NDL (Prosthesis and Implant ENT) ×2 IMPLANT
KIT TURNOVER KIT B (KITS) ×2 IMPLANT
NDL HYPO 25GX1X1/2 BEV (NEEDLE) IMPLANT
NEEDLE HYPO 25GX1X1/2 BEV (NEEDLE) IMPLANT
NS IRRIG 1000ML POUR BTL (IV SOLUTION) ×2 IMPLANT
PAD ARMBOARD 7.5X6 YLW CONV (MISCELLANEOUS) ×4 IMPLANT
PATTIES SURGICAL .5 X1 (DISPOSABLE) ×2 IMPLANT
PATTIES SURGICAL .5 X3 (DISPOSABLE) ×2 IMPLANT
POSITIONER HEAD DONUT 9IN (MISCELLANEOUS) IMPLANT
SOL ANTI FOG 6CC (MISCELLANEOUS) ×1 IMPLANT
SOLUTION ANTI FOG 6CC (MISCELLANEOUS) ×1
SURGILUBE 2OZ TUBE FLIPTOP (MISCELLANEOUS) IMPLANT
SUT SILK 2 0 PERMA HAND 18 BK (SUTURE) IMPLANT
TOWEL GREEN STERILE FF (TOWEL DISPOSABLE) ×2 IMPLANT
TUBE CONNECTING 12X1/4 (SUCTIONS) ×2 IMPLANT
WATER STERILE IRR 1000ML POUR (IV SOLUTION) ×2 IMPLANT

## 2021-01-23 NOTE — Op Note (Signed)
PREOPERATIVE DIAGNOSIS:  Hoarseness and left vocal cord paralysis   POSTOPERATIVE DIAGNOSIS:  Hoarseness and left vocal cord paralysis   PROCEDURE:  Suspended microdirect laryngoscopy with Prolaryn injections   SURGEON:  Melida Quitter, MD   ANESTHESIA:  General with jet ventilation by anesthesia.   COMPLICATIONS:  None.   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  She presents to the operating room for surgical management.   FINDINGS:  Thin vocal folds, more on left side.   DESCRIPTION OF PROCEDURE:  The patient was identified in the holding room, informed consent having been obtained including discussion of risks, benefits and alternatives, the patient was brought to the operative suite and put the operative table in  supine position.  Anesthesia was induced and the patient was maintained via mask ventilation.  The eyes were taped closed and bed was turned 90 degrees from anesthesia.  The patient was given intravenous steroids during the  case.  A tooth guard was placed over the upper teeth and a Stortz laryngoscope was placed into the supraglottic position and suspended to Mayo stand using the Lewy arm.  Jet ventilation was initiated.  Photodocumentation was performed with the zero degree telescope.  Under the operating microscope, Prolaryn was injected into the lateral left vocal fold totaling 0.85 cc with most injected posteriorly.  The right vocal fold was then injected with 0.2 cc.  Photodocumentation was repeated.  The larynx was sprayed with topical lidocaine.  The laryngoscope was then taking out of suspension and removed from the patient's mouth while suctioning the airway.  The tooth guard was removed and the patient was turned back to anesthesia for wakeup and taken to the recovery room in stable condition.

## 2021-01-23 NOTE — H&P (Signed)
Vicki [name] is an 65 y.o. female.   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  She presents for injection augmentation.  Past Medical History:  Diagnosis Date  . CAD (coronary artery disease) 2011   with stent to RCA and 60 % LAD disease  . Clotting disorder ([place])   . Dyspnea   . Family history of breast cancer   . Family history of cystic fibrosis   . GERD (gastroesophageal reflux disease)   . H/O cardiac arrest 02/15/10   with STEMI- Inf wall  . Hyperlipidemia   . Hypertension   . Myocardial infarct ([place]) 02/15/10  . pancreatic ca dx'd 02/2020  . Pneumonia   . Shock, cardiogenic ([place]) 01/2010   with MI, IABP    Past Surgical History:  Procedure Laterality Date  . ABDOMINAL HYSTERECTOMY  1987   BSO  . APPENDECTOMY  1987  . BILIARY STENT PLACEMENT N/A 03/05/2020   Procedure: BILIARY STENT PLACEMENT;  Surgeon: Clarene Essex, MD;  Location: [facility];  Service: Endoscopy;  Laterality: N/A;  . BILIARY STENT PLACEMENT N/A 09/06/2020   Procedure: BILIARY STENT PLACEMENT;  Surgeon: Clarene Essex, MD;  [facility]: [facility];  Service: Endoscopy;  Laterality: N/A;  . CORONARY ANGIOPLASTY WITH STENT PLACEMENT  02/15/10   Stent to prox RCA -BMS  . DOPPLER ECHOCARDIOGRAPHY  05/06/2010   EF =50-55%  lvfx low normal ;no sigificant valvular disease seen  . ENDOSCOPIC RETROGRADE CHOLANGIOPANCREATOGRAPHY (ERCP) WITH PROPOFOL N/A 02/29/2020   Procedure: ENDOSCOPIC RETROGRADE CHOLANGIOPANCREATOGRAPHY (ERCP) WITH PROPOFOL;  Surgeon: Arta Silence, MD;  [facility];  Service: Endoscopy;  Laterality: N/A;  . ERCP N/A 03/05/2020   Procedure: ENDOSCOPIC RETROGRADE CHOLANGIOPANCREATOGRAPHY (ERCP);  Surgeon: Clarene Essex, MD;  Location: [facility];  Service: Endoscopy;  Laterality: N/A;  with stent placement  . ERCP N/A 09/06/2020   Procedure: ENDOSCOPIC RETROGRADE CHOLANGIOPANCREATOGRAPHY (ERCP);   Surgeon: Clarene Essex, MD;  Location: [facility];  Service: Endoscopy;  Laterality: N/A;  . ESOPHAGOGASTRODUODENOSCOPY (EGD) WITH PROPOFOL N/A 02/29/2020   Procedure: ESOPHAGOGASTRODUODENOSCOPY (EGD) WITH PROPOFOL;  Surgeon: Arta Silence, MD;  [facility];  Service: Endoscopy;  Laterality: N/A;  . EXPLORATORY LAPAROTOMY    . FINE NEEDLE ASPIRATION  02/29/2020   Procedure: FINE NEEDLE ASPIRATION;  Surgeon: Arta Silence, MD;  [facility];  Service: Endoscopy;;  . HERNIA REPAIR  1980   double bilateral  . IR IMAGING GUIDED PORT INSERTION  03/26/2020  . IR RADIOLOGIST EVAL & MGMT  12/17/2020  . MASS EXCISION  09/02/2012   Procedure: EXCISION MASS;  Surgeon: Earnstine Regal, MD;  Location: [facility];  Service: General;  Laterality: Left;  Excise mass Left posterior neck  . NM MYOCAR PERF WALL MOTION  05/06/2010   exercise cap 8 mets. ,mild perfusion defect in basal infer.,mid infer., apical infer., region consistent with infarct/scar. no significant ischemia  . PANCREATIC STENT PLACEMENT  02/29/2020   Procedure: PANCREATIC STENT PLACEMENT;  Surgeon: Arta Silence, MD;  [facility];  Service: Endoscopy;;  . right chest wall exploration    . SPHINCTEROTOMY  02/29/2020   Procedure: SPHINCTEROTOMY;  Surgeon: Arta Silence, MD;  Location: [facility];  Service: Endoscopy;;  . [name]  03/05/2020   Procedure: [name];  Surgeon: Clarene Essex, MD;  [facility];  Service: Endoscopy;;  . UPPER ESOPHAGEAL ENDOSCOPIC ULTRASOUND (EUS) N/A 02/29/2020   Procedure: UPPER ESOPHAGEAL ENDOSCOPIC ULTRASOUND (EUS)  LINEAR SCOPE;  Surgeon: Arta Silence, MD;  Location: [facility];  Service: Endoscopy;  Laterality: N/A;  DR. NEEDS TWO HOURS FOR PROCEDURE    Family History  Problem Relation Age of Onset  . Breast cancer Mother 49  . Dementia Mother   . Cystic fibrosis Father 19       late onset dx  . Healthy Brother   . Dementia Maternal Aunt    . Dementia Maternal Uncle   . Other Paternal Uncle        MVA while in the [employer]  . Arthritis Maternal Grandmother   . AAA (abdominal aortic aneurysm) Maternal Grandfather   . Other Paternal 79        old age  . Heart attack Paternal Grandfather   . Liver disease Paternal Uncle   . Pancreatitis Son        several bouts of pancreatitis  . Breast cancer Cousin 70       mat first cousin  . Colon cancer Neg Hx    Social History:  reports that she quit smoking about 10 years ago. Her smoking use included cigarettes. She has never used smokeless tobacco. She reports previous alcohol use of about 1.0 - 2.0 standard drink of alcohol per week. She reports that she does not use drugs.  Allergies:  Allergies  Allergen Reactions  . Chlorhexidine     Do not add, port not accessed  . Contrast Media [Iodinated Diagnostic Agents] Hives    CT IV contrast- pt states she still has itching even with 4 hour and 13 hour preps 09/06/20  . Oxaliplatin     If pt receives Oxaliplatin please inf over 3 hrs.    Medications Prior to Admission  Medication Sig Dispense Refill  . acetaminophen (TYLENOL) 500 MG tablet Take 500 mg by mouth every 4 (four) hours as needed (pain).    . Albuterol Sulfate (PROAIR RESPICLICK) 937 (90 Base) MCG/ACT AEPB Inhale 1-2 puffs into the lungs every 6 (six) hours as needed (wheezing/shortness of breath).    Marland Kitchen aspirin EC 81 MG tablet Take 81 mg by mouth at bedtime. Swallow whole.    . benzonatate (TESSALON PERLES) 100 MG capsule Take 1 capsule (100 mg total) by mouth 3 (three) times daily as needed. (Patient taking differently: Take 100 mg by mouth 3 (three) times daily as needed for cough.) 20 capsule 0  . cetirizine (ZYRTEC) 10 MG tablet Take 10 mg by mouth daily.    . Cholecalciferol (VITAMIN D3) 5000 units CAPS Take 5,000 Units by mouth at bedtime.     . clopidogrel (PLAVIX) 75 MG tablet TAKE 1 TABLET(75 MG) BY MOUTH DAILY (Patient taking differently: Take 75 mg  by mouth daily.) 30 tablet 1  . escitalopram (LEXAPRO) 10 MG tablet TAKE 1 TABLET(10 MG) BY MOUTH DAILY (Patient taking differently: Take 5 mg by mouth in the morning and at bedtime.) 90 tablet 0  . gabapentin (NEURONTIN) 100 MG capsule TAKE 2 CAPSULES(200 MG) BY MOUTH AT BEDTIME (Patient taking differently: Take 200 mg by mouth at bedtime.) 60 capsule 2  . ibuprofen (ADVIL) 200 MG tablet Take 200 mg by mouth every 4 (four) hours as needed (pain).    . metoprolol tartrate (LOPRESSOR) 50 MG tablet TAKE 1 TABLET(50 MG) BY MOUTH TWICE DAILY (Patient taking differently: Take 25 mg by mouth 2 (two) times daily.) 180 tablet 1  . montelukast (SINGULAIR) 10 MG tablet TAKE 1 TABLET(10 MG) BY MOUTH AT BEDTIME 90 tablet 1  . morphine (MS  CONTIN) 100 MG 12 hr tablet Take 1 tablet (100 mg total) by mouth every 8 (eight) hours. 90 tablet 0  . Omega-3 Fatty Acids (FISH OIL) 1000 MG CAPS Take 1,000 mg by mouth at bedtime.    . Oxycodone HCl 20 MG TABS Take 1.5 tablets (30 mg total) by mouth every 4 (four) hours as needed. (Patient taking differently: Take 30 mg by mouth every 4 (four) hours as needed (breakthrough pain).) 130 tablet 0  . Oxymetazoline HCl (VICKS SINEX SEVERE NA) Place 1 spray into both nostrils 2 (two) times daily as needed (congestion).    . pantoprazole (PROTONIX) 40 MG tablet TAKE 1 TABLET(40 MG) BY MOUTH TWICE DAILY (Patient taking differently: Take 40 mg by mouth 2 (two) times daily.) 180 tablet 3  . polyethylene glycol (MIRALAX / GLYCOLAX) 17 g packet Take 17 g by mouth daily as needed for mild constipation or moderate constipation.     . potassium chloride SA (KLOR-CON) 20 MEQ tablet TAKE 1 TABLET(20 MEQ) BY MOUTH DAILY (Patient taking differently: Take 20 mEq by mouth daily.) 90 tablet 3  . rosuvastatin (CRESTOR) 10 MG tablet TAKE 1 TABLET(10 MG) BY MOUTH DAILY (Patient taking differently: Take 10 mg by mouth daily.) 30 tablet 1  . albuterol (PROVENTIL) (2.5 MG/3ML) 0.083% nebulizer solution  Take 2.5 mg by nebulization every 6 (six) hours as needed for wheezing or shortness of breath.    . ALPRAZolam (XANAX) 0.25 MG tablet TAKE 1 TABLET(0.25 MG) BY MOUTH AT BEDTIME (Patient taking differently: Take 0.25 mg by mouth at bedtime.) 30 tablet 3  . dexamethasone (DECADRON) 4 MG tablet Take 2 tablets (8 mg total) by mouth 2 (two) times daily. X 3 days after each chemo. Start on day 2 24 tablet 1  . doxycycline (VIBRA-TABS) 100 MG tablet Take 1 tablet (100 mg total) by mouth 2 (two) times daily. (Patient not taking: Reported on 01/16/2021) 20 tablet 0  . ondansetron (ZOFRAN) 8 MG tablet Take 1 tablet (8 mg total) by mouth every 8 (eight) hours as needed for nausea or vomiting. (Patient not taking: Reported on 01/16/2021) 60 tablet 1  . predniSONE (DELTASONE) 50 MG tablet Take #1 tablet 13 hours, 7 hours and 1 hour prior to CT scan. Take Benadryl 50 mg with 1 hour dose (Patient not taking: Reported on 01/16/2021) 3 tablet 1    Results for orders placed or performed during the hospital encounter of 01/23/21 (from the past 48 hour(s))  Basic metabolic panel per protocol     Status: Abnormal   Collection Time: 01/23/21 11:20 AM  Result Value Ref Range   Sodium 138 135 - 145 mmol/L   Potassium 3.7 3.5 - 5.1 mmol/L   Chloride 104 98 - 111 mmol/L   CO2 25 22 - 32 mmol/L   Glucose, Bld 102 (H) 70 - 99 mg/dL    Comment: Glucose reference range applies only to samples taken after fasting for at least 8 hours.   BUN 8 8 - 23 mg/dL   Creatinine, Ser 0.47 0.44 - 1.00 mg/dL   Calcium 8.5 (L) 8.9 - 10.3 mg/dL   GFR, Estimated >60 >60 mL/min    Comment: (NOTE) Calculated using the CKD-EPI Creatinine Equation (2021)    Anion gap 9 5 - 15    Comment: Performed at [facility] [address], [place], [place] [address]  CBC per protocol     Status: Abnormal   Collection Time: 01/23/21 11:20 AM  Result Value Ref  Range   WBC 4.6 4.0 - 10.5 K/uL   RBC 3.48 (L) 3.87 - 5.11 MIL/uL   Hemoglobin 10.5  (L) 12.0 - 15.0 g/dL   HCT 32.8 (L) 36.0 - 46.0 %   MCV 94.3 80.0 - 100.0 fL   MCH 30.2 26.0 - 34.0 pg   MCHC 32.0 30.0 - 36.0 g/dL   RDW 17.3 (H) 11.5 - 15.5 %   Platelets 347 150 - 400 K/uL   nRBC 0.0 0.0 - 0.2 %    Comment: Performed at [facility] [address] [place], [place] [address]   No results found.  Review of Systems  Musculoskeletal: Positive for back pain.  All other systems reviewed and are negative.   Blood pressure 135/72, pulse (!) 56, temperature 98.1 F (36.7 C), temperature source Oral, resp. rate 18, height 5\' 6"  (1.676 m), weight 54.4 kg, SpO2 99 %. Physical Exam Constitutional:      Appearance: Normal appearance. She is normal weight.  HENT:     Head: Normocephalic and atraumatic.     Right Ear: External ear normal.     Left Ear: External ear normal.     Nose: Nose normal.     Mouth/Throat:     Mouth: Mucous membranes are moist.     Pharynx: Oropharynx is clear.     Comments: Very breathy dysphonia. Eyes:     Extraocular Movements: Extraocular movements intact.     Conjunctiva/sclera: Conjunctivae normal.     Pupils: Pupils are equal, round, and reactive to light.  Cardiovascular:     Rate and Rhythm: Normal rate.  Pulmonary:     Effort: Pulmonary effort is normal.  Musculoskeletal:     Cervical back: Normal range of motion.  Skin:    General: Skin is warm and dry.  Neurological:     General: No focal deficit present.     Mental Status: She is alert and oriented to person, place, and time.  Psychiatric:        Mood and Affect: Mood normal.        Behavior: Behavior normal.        Thought Content: Thought content normal.        Judgment: Judgment normal.      Assessment/Plan Left vocal cord paralysis and dysphonia  To OR for SMDL with Prolaryn injection.  Melida Quitter, MD 01/23/2021, 12:49 PM

## 2021-01-23 NOTE — Anesthesia Postprocedure Evaluation (Signed)
Anesthesia Post Note  Patient: DELOISE MARCHANT  Procedure(s) Performed: MICROLARYNGOSCOPY WITH Prolaryn injection (N/A )     Patient location during evaluation: PACU Anesthesia Type: General Level of consciousness: awake and alert Pain management: pain level controlled Vital Signs Assessment: post-procedure vital signs reviewed and stable Respiratory status: spontaneous breathing, nonlabored ventilation, respiratory function stable and patient connected to nasal cannula oxygen Cardiovascular status: blood pressure returned to baseline and stable Postop Assessment: no apparent nausea or vomiting Anesthetic complications: no Comments: Called by RN due to PVC's in PACU. Patient evaluated and found to be hemodynamically stable.    No complications documented.  Last Vitals:  Vitals:   01/23/21 1441 01/23/21 1456  BP: (!) 156/74   Pulse: (!) 52 (!) 55  Resp: 14 15  Temp: (!) 36.3 C   SpO2: 100% 100%    Last Pain:  Vitals:   01/23/21 1441  TempSrc:   PainSc: Asleep                 Rosell Khouri P Axel Frisk

## 2021-01-23 NOTE — Brief Op Note (Signed)
01/23/2021  2:00 PM  PATIENT:  Vicki [name]  65 y.o. female  PRE-OPERATIVE DIAGNOSIS:  Paralysis of left vocal cord  POST-OPERATIVE DIAGNOSIS:  Paralysis of left vocal cord  PROCEDURE:  Procedure(s): MICROLARYNGOSCOPY WITH Prolaryn injection (N/A)  SURGEON:  Surgeon(s) and Role:    * Melida Quitter, MD - Primary  PHYSICIAN ASSISTANT:   ASSISTANTS: none   ANESTHESIA:   general  EBL:  Minimal  BLOOD ADMINISTERED:none  DRAINS: none   LOCAL MEDICATIONS USED:  NONE  SPECIMEN:  No Specimen  DISPOSITION OF SPECIMEN:  N/A  COUNTS:  YES  TOURNIQUET:  * No tourniquets in log *  DICTATION: .Note written in EPIC  PLAN OF CARE: Discharge to home after PACU  PATIENT DISPOSITION:  PACU - hemodynamically stable.   Delay start of Pharmacological VTE agent (>24hrs) due to surgical blood loss or risk of bleeding: no

## 2021-01-23 NOTE — Transfer of Care (Signed)
Immediate Anesthesia Transfer of Care Note  Patient: Vicki [name]  Procedure(s) Performed: MICROLARYNGOSCOPY WITH Prolaryn injection (N/A )  Patient Location: PACU  Anesthesia Type:General  Level of Consciousness: sedated  Airway & Oxygen Therapy: Patient Spontanous Breathing and Patient connected to face mask oxygen  Post-op Assessment: Report given to RN and Post -op Vital signs reviewed and stable  Post vital signs: Reviewed and stable  Last Vitals:  Vitals Value Taken Time  BP 122/69 01/23/21 1411  Temp    Pulse 50 01/23/21 1413  Resp 12 01/23/21 1413  SpO2 100 % 01/23/21 1413  Vitals shown include unvalidated device data.  Last Pain:  Vitals:   01/23/21 1111  TempSrc:   PainSc: 0-No pain      Patients Stated Pain Goal: 4 (68/86/48 4720)  Complications: No complications documented.

## 2021-01-24 ENCOUNTER — Encounter (HOSPITAL_COMMUNITY): Payer: Self-pay | Admitting: Otolaryngology

## 2021-01-24 ENCOUNTER — Encounter: Payer: Self-pay | Admitting: *Deleted

## 2021-01-24 ENCOUNTER — Other Ambulatory Visit: Payer: Self-pay | Admitting: Nurse Practitioner

## 2021-01-24 DIAGNOSIS — C250 Malignant neoplasm of head of pancreas: Secondary | ICD-10-CM

## 2021-01-24 MED ORDER — OXYCODONE HCL 20 MG PO TABS: 30.0000 mg | ORAL_TABLET | ORAL | 0 refills | Status: DC | PRN

## 2021-01-27 MED FILL — OXYCODONE HCL 20 MG TABS: 20 | 22 days supply | Qty: 130 | Fill #0

## 2021-01-30 ENCOUNTER — Encounter: Payer: Self-pay | Admitting: Nurse Practitioner

## 2021-01-30 ENCOUNTER — Inpatient Hospital Stay (HOSPITAL_BASED_OUTPATIENT_CLINIC_OR_DEPARTMENT_OTHER): Payer: Medicare Other | Admitting: Nurse Practitioner

## 2021-01-30 ENCOUNTER — Other Ambulatory Visit: Payer: Self-pay

## 2021-01-30 ENCOUNTER — Inpatient Hospital Stay: Payer: Medicare Other | Attending: Nurse Practitioner

## 2021-01-30 ENCOUNTER — Inpatient Hospital Stay: Payer: Medicare Other

## 2021-01-30 VITALS — BP 127/79 | HR 65 | Temp 97.8°F | Ht 66.0 in | Wt 121.5 lb

## 2021-01-30 DIAGNOSIS — C250 Malignant neoplasm of head of pancreas: Secondary | ICD-10-CM | POA: Insufficient documentation

## 2021-01-30 DIAGNOSIS — Z5111 Encounter for antineoplastic chemotherapy: Secondary | ICD-10-CM | POA: Insufficient documentation

## 2021-01-30 DIAGNOSIS — Z95828 Presence of other vascular implants and grafts: Secondary | ICD-10-CM

## 2021-01-30 LAB — CMP (CANCER CENTER ONLY)
ALT: 14 U/L (ref 0–44)
AST: 18 U/L (ref 15–41)
Albumin: 3.3 g/dL — ABNORMAL LOW (ref 3.5–5.0)
Alkaline Phosphatase: 106 U/L (ref 38–126)
Anion gap: 6 (ref 5–15)
BUN: 11 mg/dL (ref 8–23)
CO2: 23 mmol/L (ref 22–32)
Calcium: 8.1 mg/dL — ABNORMAL LOW (ref 8.9–10.3)
Chloride: 109 mmol/L (ref 98–111)
Creatinine: 0.63 mg/dL (ref 0.44–1.00)
GFR, Estimated: >60 mL/min (ref >60)
Glucose, Bld: 105 mg/dL — ABNORMAL HIGH (ref 70–99)
Potassium: 3.4 mmol/L — ABNORMAL LOW (ref 3.5–5.1)
Sodium: 138 mmol/L (ref 135–145)
Total Bilirubin: 0.2 mg/dL — ABNORMAL LOW (ref 0.3–1.2)
Total Protein: 5.9 g/dL — ABNORMAL LOW (ref 6.5–8.1)

## 2021-01-30 LAB — CBC WITH DIFFERENTIAL (CANCER CENTER ONLY)
Abs Immature Granulocytes: 0.01 10*3/uL (ref 0.00–0.07)
Basophils Absolute: 0 10*3/uL (ref 0.0–0.1)
Basophils Relative: 0 %
Eosinophils Absolute: 0.2 10*3/uL (ref 0.0–0.5)
Eosinophils Relative: 3 %
HCT: 30.9 % — ABNORMAL LOW (ref 36.0–46.0)
Hemoglobin: 10 g/dL — ABNORMAL LOW (ref 12.0–15.0)
Immature Granulocytes: 0 %
Lymphocytes Relative: 23 %
Lymphs Abs: 1.3 10*3/uL (ref 0.7–4.0)
MCH: 30 pg (ref 26.0–34.0)
MCHC: 32.4 g/dL (ref 30.0–36.0)
MCV: 92.8 fL (ref 80.0–100.0)
Monocytes Absolute: 0.6 10*3/uL (ref 0.1–1.0)
Monocytes Relative: 10 %
Neutro Abs: 3.6 10*3/uL (ref 1.7–7.7)
Neutrophils Relative %: 64 %
Platelet Count: 365 10*3/uL (ref 150–400)
RBC: 3.33 MIL/uL — ABNORMAL LOW (ref 3.87–5.11)
RDW: 17 % — ABNORMAL HIGH (ref 11.5–15.5)
WBC Count: 5.7 10*3/uL (ref 4.0–10.5)
nRBC: 0 % (ref 0.0–0.2)

## 2021-01-30 MED ORDER — ONDANSETRON HCL 4 MG/2ML IJ SOLN
INTRAMUSCULAR | Status: AC
  Filled 2021-01-30: qty 4

## 2021-01-30 MED ORDER — SODIUM CHLORIDE 0.9% FLUSH
10.0000 mL | Freq: Once | INTRAVENOUS | Status: AC
  Administered 2021-01-30: 10 mL
  Filled 2021-01-30: qty 10

## 2021-01-30 MED ORDER — GEMCITABINE HCL CHEMO INJECTION 1 GM/26.3ML
800.0000 mg/m2 | Freq: Once | INTRAVENOUS | Status: AC
  Administered 2021-01-30: 1254 mg via INTRAVENOUS
  Filled 2021-01-30: qty 32.98

## 2021-01-30 MED ORDER — PROCHLORPERAZINE MALEATE 10 MG PO TABS
10.0000 mg | ORAL_TABLET | Freq: Once | ORAL | Status: AC
  Administered 2021-01-30: 10 mg via ORAL

## 2021-01-30 MED ORDER — PACLITAXEL PROTEIN-BOUND CHEMO INJECTION 100 MG
100.0000 mg/m2 | Freq: Once | INTRAVENOUS | Status: AC
  Administered 2021-01-30: 150 mg via INTRAVENOUS
  Filled 2021-01-30: qty 30

## 2021-01-30 MED ORDER — PROCHLORPERAZINE MALEATE 10 MG PO TABS
ORAL_TABLET | ORAL | Status: AC
  Filled 2021-01-30: qty 1

## 2021-01-30 MED ORDER — SODIUM CHLORIDE 0.9% FLUSH
10.0000 mL | INTRAVENOUS | Status: DC | PRN
  Administered 2021-01-30: 10 mL
  Filled 2021-01-30: qty 10

## 2021-01-30 MED ORDER — HEPARIN SOD (PORK) LOCK FLUSH 100 UNIT/ML IV SOLN
500.0000 [IU] | Freq: Once | INTRAVENOUS | Status: AC | PRN
  Administered 2021-01-30: 500 [IU]
  Filled 2021-01-30: qty 5

## 2021-01-30 MED ORDER — SODIUM CHLORIDE 0.9 % IV SOLN
Freq: Once | INTRAVENOUS | Status: AC
  Filled 2021-01-30: qty 250

## 2021-01-30 MED ORDER — ONDANSETRON HCL 4 MG/2ML IJ SOLN
8.0000 mg | Freq: Once | INTRAMUSCULAR | Status: AC
  Administered 2021-01-30: 8 mg via INTRAVENOUS

## 2021-01-30 NOTE — Progress Notes (Signed)
[facility] OFFICE PROGRESS NOTE   Diagnosis: Pancreas cancer  INTERVAL HISTORY:   Vicki [name] returns as scheduled. She completed cycle 1 gemcitabine/Abraxane 01/08/2021. She requested cycle 2 to be delayed to undergo a vocal cord procedure. On 01/23/2021 she underwent vocal cord injections.  She notes the hoarseness is slowly improving.  No increase in baseline intermittent nausea following chemotherapy.  No mouth sores after chemotherapy.  Bowel habits continue to alternate constipation and diarrhea.  Back pain overall is some better.  She is trying to decrease usage of breakthrough pain medication.  Objective:  Vital signs in last 24 hours:  Blood pressure 127/79, pulse 65, temperature 97.8 F (36.6 C), temperature source Tympanic, height 5\' 6"  (1.676 m), weight 121 lb 8 oz (55.1 kg), SpO2 99 %.    HEENT: No thrush or ulcers. Lymphatics: Firm palpable lymph node left low neck/supraclavicular region. Resp: Lungs clear bilaterally. Cardio: Regular rate and rhythm. GI: No hepatosplenomegaly. Vascular: Trace ankle/pedal edema. Skin: Palms without erythema. Port-A-Cath without erythema.   Lab Results:  Lab Results  Component Value Date   WBC 5.7 01/30/2021   HGB 10.0 (L) 01/30/2021   HCT 30.9 (L) 01/30/2021   MCV 92.8 01/30/2021   PLT 365 01/30/2021   NEUTROABS 3.6 01/30/2021    Imaging:  No results found.  Medications: I have reviewed the patient's current medications.  Assessment/Plan: 1. 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 MRI abdomen 02/01/2020-2.2 x 3.3 cm mass in the posterior pancreas head/uncinate process, mild intrahepatic/extrahepatic ductal dilatation, no evidence of vascular invasion, possible small lymph nodes in the porta hepatis-poorly visualized ? EUS 02/29/2020-20 x 23 mm mass in the pancreas head, upstream pancreatic duct dilatation, 1 abnormal peripancreatic node, uT3?uN1 ? ERCP 03/05/2020-common  bile duct stricture, uncovered metal stent placed ? CTs 03/19/2020-2.9 x 2.1 x 3.1 cm pancreas head/uncinate mass, lesion associated with infrarenal abdominal aorta and superior mesenteric artery, prominent and borderline enlarged retroperitoneal nodes including a left periaortic node, indeterminate 4 mm lung nodule ? Cycle 1 mFOLFIRINOX 7/93/90, complicated by n/v and hospital admission 5/2/ - 5/3 ? Second opinion at [facility] with Dr. Earnestine Mealing and Dr. Mariah Milling 04/02/20 ? PET scan 04/03/2020-hypermetabolic poorly marginated pancreatic head mass; 2 hypermetabolic left periaortic lymph nodes, hypermetabolic left supraclavicular lymph node ? Cycle 2 FOLFIRINOX 04/11/2020 ? Ultrasound-guided biopsy of a left supraclavicular mass on 04/18/2020-poorly differentiated adenocarcinoma, cytokeratin 7+, cytokeratin 20 and CDX2 positive in rare cells. ? Cycle 3 FOLFIRINOX 04/24/2020-oxaliplatin infusion lengthened to 3 hours, Udenyca was not given (ordered) ? Cycle 4 FOLFIRINOX 05/08/2020 ? Cycle 5 FOLFIRINOX 05/22/2020-atropine and Ativan premedication added secondary to cholinergic symptoms and nausea from irinotecan ? CTs 05/29/2020-decreased left supraclavicular lymph node, slight decrease in size of the pancreas head mass, decrease in retroperitoneal lymphadenopathy ? Cycle 6 FOLFIRINOX 06/05/2020 ? Cycle 7 FOLFIRINOX 06/26/2020 ? Cycle 8 FOLFIRINOX 07/11/2020 (oxaliplatin held secondary to neuropathy) ? Cycle 9 FOLFIRINOX 07/24/2020 (oxaliplatin held secondary to neuropathy) ? Cycle 10 FOLFIRINOX 08/07/2020 (oxaliplatin held secondary to neuropathy) ? CTs 08/19/2020-subtle 5 mm peripheral right liver lesion, pancreas mass unchanged and difficult to discretely delineate, unchanged left retroperitoneal node, no evidence of disease progression, onDr. Sherrill'sreview of the CTs there is no change in the previously enlarged left supraclavicular node ? Cycle 11 FOLFIRINOX 09/18/2020 (oxaliplatin held secondary to  neuropathy) ? Cycle 12 FOLFIRINOX 10/08/2020 (oxaliplatin held secondary to neuropathy) ? Cycle 13 FOLFIRINOX 10/30/2020 (oxaliplatin held secondary to neuropathy) ? CTs 11/14/2020-no evidence of thoracic metastases,  slight enlargement of the pancreas head mass, increase in size of periaortic lymph node ? Cycle 14 FOLFIRINOX 11/25/2020 (oxaliplatin held secondary to neuropathy) ? Cycle 1 gemcitabine/Abraxane 01/08/2021 ? Cycle 2 gemcitabine/Abraxane 01/30/2021  2. Abdomen/back pain secondary to #1-progressive January 2022 3. Anorexia/weight loss secondary to #1 4. History of coronary artery disease,STEMI with the fib arrest 2011, status post RCA stent 5. Constipation, secondary to #1 and morphine 6. 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 Metal stent placed into the common bile duct 09/06/2020. 9.  Hoarseness status post vocal cord injections 01/23/2021   Disposition: Vicki [name] appears stable.  She has completed 1 cycle of gemcitabine/Abraxane.  Overall she tolerated well.  Plan to proceed with cycle 2 today as scheduled.  We reviewed the CBC and chemistry panel from today.  Labs adequate to proceed with treatment.  She has mild hypokalemia.  She has not taken the potassium supplement for the past 2 days.  She plans to resume this.  She will continue to wean the breakthrough pain medication as tolerated.  She will return for lab, follow-up, cycle 3 gemcitabine/Abraxane in 2 weeks.  She will contact the office in the interim with any problems.    [name] ANP/GNP-BC   01/30/2021  10:22 AM

## 2021-01-30 NOTE — Patient Instructions (Signed)
[facility] Discharge Instructions for Patients Receiving Chemotherapy  Today you received the following chemotherapy agents abraxane, gemcitabine  To help prevent nausea and vomiting after your treatment, we encourage you to take your nausea medication as directed. If you develop nausea and vomiting that is not controlled by your nausea medication, call the clinic.   BELOW ARE SYMPTOMS THAT SHOULD BE REPORTED IMMEDIATELY:  *FEVER GREATER THAN 100.5 F  *CHILLS WITH OR WITHOUT FEVER  NAUSEA AND VOMITING THAT IS NOT CONTROLLED WITH YOUR NAUSEA MEDICATION  *UNUSUAL SHORTNESS OF BREATH  *UNUSUAL BRUISING OR BLEEDING  TENDERNESS IN MOUTH AND THROAT WITH OR WITHOUT PRESENCE OF ULCERS  *URINARY PROBLEMS  *BOWEL PROBLEMS  UNUSUAL RASH Items with * indicate a potential emergency and should be followed up as soon as possible.  Feel free to call the clinic should you have any questions or concerns. The clinic phone number is [phone].  Please show the [facility] at check-in to the Emergency Department and triage nurse.

## 2021-01-31 ENCOUNTER — Telehealth: Payer: Self-pay | Admitting: *Deleted

## 2021-01-31 ENCOUNTER — Ambulatory Visit (HOSPITAL_COMMUNITY)
Admission: RE | Admit: 2021-01-31 | Discharge: 2021-01-31 | Disposition: A | Discharge status: Home / Self Care | Payer: Medicare Other | Source: Ambulatory Visit | Attending: Nurse Practitioner | Admitting: Nurse Practitioner

## 2021-01-31 ENCOUNTER — Inpatient Hospital Stay (HOSPITAL_BASED_OUTPATIENT_CLINIC_OR_DEPARTMENT_OTHER): Payer: Medicare Other | Admitting: Nurse Practitioner

## 2021-01-31 ENCOUNTER — Telehealth: Payer: Self-pay | Admitting: Nurse Practitioner

## 2021-01-31 VITALS — BP 118/63 | HR 63 | Temp 97.5°F | Resp 18 | Ht 66.0 in | Wt 119.5 lb

## 2021-01-31 DIAGNOSIS — C250 Malignant neoplasm of head of pancreas: Secondary | ICD-10-CM

## 2021-01-31 DIAGNOSIS — Z5111 Encounter for antineoplastic chemotherapy: Secondary | ICD-10-CM | POA: Diagnosis not present

## 2021-01-31 MED ORDER — METHYLPREDNISOLONE 4 MG PO TBPK: ORAL_TABLET | ORAL | 0 refills | Status: DC

## 2021-01-31 NOTE — Telephone Encounter (Signed)
Per [name], NP: Come in now to be worked in with a CXR at [facility] prior. She agrees.

## 2021-01-31 NOTE — Telephone Encounter (Signed)
Reports she had some nausea today and vomited after her anti-emetic. Had a choking sensation afterwards, but this has subsided. Today she has been short of breath w/exertion and has started to need to use her inhaler. Has also been coughing more. No fever and no new swelling. She is asking to be seen today--afraid of weekend approaching and not feeling well.

## 2021-01-31 NOTE — Telephone Encounter (Signed)
Scheduled appts per 3/3 los. Called pt to go over appts. Pt stated she is not feeling well and asked for print out of calendar at 3/4 visit. Added request to appt notes.

## 2021-01-31 NOTE — Progress Notes (Addendum)
[place] OFFICE PROGRESS NOTE   Diagnosis: Pancreas cancer  INTERVAL HISTORY:   Vicki [name] returns prior to scheduled follow-up for evaluation of dyspnea.  She completed cycle 2 gemcitabine/Abraxane 01/30/2021.  She started feeling "bad" last night.  She was nauseated this morning.  She feels short of breath, clearing her throat frequently.  She tried her inhaler which did not help.  She denies fever.  No rash.  She has some swelling in the feet.  Objective:  Vital signs in last 24 hours:  Blood pressure 118/63, pulse 63, temperature (!) 97.5 F (36.4 C), temperature source Tympanic, resp. rate 18, height 5\' 6"  (1.676 m), weight 119 lb 8 oz (54.2 kg), SpO2 100 %.    HEENT: No thrush or ulcers. Lymphatics: Firm tender palpable lymph node at the left low neck. Resp: End inspiratory wheezes bilaterally.  Good air movement.  No respiratory distress. Cardio: Regular rate and rhythm. GI: No hepatomegaly. Vascular: Trace bilateral ankle/pedal edema.  Port-A-Cath without erythema.  Lab Results:  Lab Results  Component Value Date   WBC 5.7 01/30/2021   HGB 10.0 (L) 01/30/2021   HCT 30.9 (L) 01/30/2021   MCV 92.8 01/30/2021   PLT 365 01/30/2021   NEUTROABS 3.6 01/30/2021    Imaging:  DG Chest 2 View  Result Date: 01/31/2021 CLINICAL DATA:  Shortness of breath and a cough for 2 days. Pancreatic cancer. EXAM: CHEST - 2 VIEW COMPARISON:  11/14/2020 chest CT.  09/12/2020 plain film. FINDINGS: Hyperinflation. Midline trachea. Normal heart size. Atherosclerosis in the transverse aorta. No pleural effusion or pneumothorax. Diffuse peribronchial thickening. No lobar consolidation. Biliary stent. No free intraperitoneal air. IMPRESSION: 1. No acute cardiopulmonary disease. 2. Hyperinflation and interstitial thickening, most consistent with COPD/chronic bronchitis. Electronically Signed   By: Abigail Miyamoto M.D.   On: 01/31/2021 13:25    Medications: I have reviewed the  patient's current medications.  Assessment/Plan: 1. 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 MRI abdomen 02/01/2020-2.2 x 3.3 cm mass in the posterior pancreas head/uncinate process, mild intrahepatic/extrahepatic ductal dilatation, no evidence of vascular invasion, possible small lymph nodes in the porta hepatis-poorly visualized ? EUS 02/29/2020-20 x 23 mm mass in the pancreas head, upstream pancreatic duct dilatation, 1 abnormal peripancreatic node, uT3?uN1 ? 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 CTs 03/19/2020-2.9 x 2.1 x 3.1 cm pancreas head/uncinate mass, lesion associated with infrarenal abdominal aorta and superior mesenteric artery, prominent and borderline enlarged retroperitoneal nodes including a left periaortic node, indeterminate 4 mm lung nodule ? Cycle 1 mFOLFIRINOX 6/76/72, complicated by n/v and hospital admission 5/2/ - 5/3 ? Second opinion at [facility] with Dr. Earnestine Mealing and Dr. Mariah Milling 04/02/20 ? PET scan 04/03/2020-hypermetabolic poorly marginated pancreatic head mass; 2 hypermetabolic left periaortic lymph nodes, hypermetabolic left supraclavicular lymph node ? Cycle 2 FOLFIRINOX 04/11/2020 ? Ultrasound-guided biopsy of a left supraclavicular mass on 04/18/2020-poorly differentiated adenocarcinoma, cytokeratin 7+, cytokeratin 20 and CDX2 positive in rare cells. ? Cycle 3 FOLFIRINOX 04/24/2020-oxaliplatin infusion lengthened to 3 hours, Udenyca was not given (ordered) ? Cycle 4 FOLFIRINOX 05/08/2020 ? Cycle 5 FOLFIRINOX 05/22/2020-atropine and Ativan premedication added secondary to cholinergic symptoms and nausea from irinotecan ? CTs 05/29/2020-decreased left supraclavicular lymph node, slight decrease in size of the pancreas head mass, decrease in retroperitoneal lymphadenopathy ? Cycle 6 FOLFIRINOX 06/05/2020 ? Cycle 7 FOLFIRINOX 06/26/2020 ? Cycle 8 FOLFIRINOX 07/11/2020 (oxaliplatin held secondary to neuropathy) ? Cycle  9 FOLFIRINOX 07/24/2020 (oxaliplatin held secondary to  neuropathy) ? Cycle 10 FOLFIRINOX 08/07/2020 (oxaliplatin held secondary to neuropathy) ? CTs 08/19/2020-subtle 5 mm peripheral right liver lesion, pancreas mass unchanged and difficult to discretely delineate, unchanged left retroperitoneal node, no evidence of disease progression, onDr. Sherrill'sreview of the CTs there is no change in the previously enlarged left supraclavicular node ? Cycle 11 FOLFIRINOX 09/18/2020 (oxaliplatin held secondary to neuropathy) ? Cycle 12 FOLFIRINOX 10/08/2020 (oxaliplatin held secondary to neuropathy) ? Cycle 13 FOLFIRINOX 10/30/2020 (oxaliplatin held secondary to neuropathy) ? CTs 11/14/2020-no evidence of thoracic metastases, slight enlargement of the pancreas head mass, increase in size of periaortic lymph node ? Cycle 14 FOLFIRINOX 11/25/2020 (oxaliplatin held secondary to neuropathy) ? Cycle 1 gemcitabine/Abraxane 01/08/2021 ? Cycle 2 gemcitabine/Abraxane 01/30/2021  2. Abdomen/back pain secondary to #1-progressive January 2022 3. Anorexia/weight loss secondary to #1 4. History of coronary artery disease,STEMI with the fib arrest 2011, status post RCA stent 5. Constipation, secondary to #1 and morphine 6. 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 Metal stent placed into the common bile duct 09/06/2020. 9.Hoarseness status post vocal cord injections 01/23/2021   Disposition: Vicki [name] appears stable.  She completed cycle 2 gemcitabine/Abraxane yesterday.  Last night/this morning she has felt short of breath.  Chest x-ray consistent with COPD, no acute process.  She is afebrile.  She has  wheezing on exam.  We are treating her for a COPD exacerbation.  She will complete a Medrol Dosepak.  She has a nebulizer at home for albuterol.  We recommend she begin this as well.  She will follow-up as scheduled 02/14/2021.  We discussed seeking emergency evaluation over the weekend if her respiratory status deteriorates.  Patient seen with Dr. Benay Spice.    [name] ANP/GNP-BC   01/31/2021  1:59 PM This was a shared visit with [name].  Vicki [name] was interviewed and examined.  She complains of increased dyspnea today.  A chest x-ray is unremarkable.  I suspect her symptoms are related to COPD.  She will begin nebulizer treatment and a steroid Dosepak.  The hoarseness appears improved following the vocal cord injection procedure.  I was present for greater than 50% of today's visit.  I performed medical decision making.  Julieanne Manson, MD

## 2021-02-09 ENCOUNTER — Other Ambulatory Visit: Payer: Self-pay | Admitting: Oncology

## 2021-02-10 ENCOUNTER — Other Ambulatory Visit: Payer: Self-pay | Admitting: Nurse Practitioner

## 2021-02-10 ENCOUNTER — Telehealth: Payer: Self-pay | Admitting: *Deleted

## 2021-02-10 DIAGNOSIS — C250 Malignant neoplasm of head of pancreas: Secondary | ICD-10-CM

## 2021-02-10 MED ORDER — OXYCODONE HCL 20 MG PO TABS: 30.0000 mg | ORAL_TABLET | ORAL | 0 refills | Status: DC | PRN

## 2021-02-10 NOTE — Telephone Encounter (Signed)
Needs refill on oxycodone and asking to move her 3/18 appointments to earlier in week. High priority scheduling message sent and MD notified of refill request.

## 2021-02-11 ENCOUNTER — Telehealth: Payer: Self-pay | Admitting: Oncology

## 2021-02-11 NOTE — Telephone Encounter (Signed)
Called per 03/14 scheduled message, informed patient we are unable to move her appointments and she will need to come in on 03/18. Patient is notified.

## 2021-02-14 ENCOUNTER — Encounter: Payer: Self-pay | Admitting: Nurse Practitioner

## 2021-02-14 ENCOUNTER — Inpatient Hospital Stay (HOSPITAL_BASED_OUTPATIENT_CLINIC_OR_DEPARTMENT_OTHER): Payer: Medicare Other | Admitting: Nurse Practitioner

## 2021-02-14 ENCOUNTER — Inpatient Hospital Stay: Payer: Medicare Other

## 2021-02-14 ENCOUNTER — Telehealth: Payer: Self-pay | Admitting: Nurse Practitioner

## 2021-02-14 ENCOUNTER — Other Ambulatory Visit: Payer: Self-pay | Admitting: Nurse Practitioner

## 2021-02-14 ENCOUNTER — Other Ambulatory Visit: Payer: Self-pay

## 2021-02-14 VITALS — BP 139/75 | HR 68 | Temp 97.8°F | Resp 20 | Ht 66.0 in | Wt 119.1 lb

## 2021-02-14 DIAGNOSIS — Z95828 Presence of other vascular implants and grafts: Secondary | ICD-10-CM

## 2021-02-14 DIAGNOSIS — C250 Malignant neoplasm of head of pancreas: Secondary | ICD-10-CM

## 2021-02-14 DIAGNOSIS — Z5111 Encounter for antineoplastic chemotherapy: Secondary | ICD-10-CM | POA: Diagnosis not present

## 2021-02-14 LAB — CBC WITH DIFFERENTIAL (CANCER CENTER ONLY)
Abs Immature Granulocytes: 0.02 10*3/uL (ref 0.00–0.07)
Basophils Absolute: 0 10*3/uL (ref 0.0–0.1)
Basophils Relative: 0 %
Eosinophils Absolute: 0.2 10*3/uL (ref 0.0–0.5)
Eosinophils Relative: 4 %
HCT: 30.1 % — ABNORMAL LOW (ref 36.0–46.0)
Hemoglobin: 10 g/dL — ABNORMAL LOW (ref 12.0–15.0)
Immature Granulocytes: 0 %
Lymphocytes Relative: 16 %
Lymphs Abs: 1.1 10*3/uL (ref 0.7–4.0)
MCH: 30.6 pg (ref 26.0–34.0)
MCHC: 33.2 g/dL (ref 30.0–36.0)
MCV: 92 fL (ref 80.0–100.0)
Monocytes Absolute: 0.6 10*3/uL (ref 0.1–1.0)
Monocytes Relative: 9 %
Neutro Abs: 4.6 10*3/uL (ref 1.7–7.7)
Neutrophils Relative %: 71 %
Platelet Count: 189 10*3/uL (ref 150–400)
RBC: 3.27 MIL/uL — ABNORMAL LOW (ref 3.87–5.11)
RDW: 15.9 % — ABNORMAL HIGH (ref 11.5–15.5)
WBC Count: 6.5 10*3/uL (ref 4.0–10.5)
nRBC: 0 % (ref 0.0–0.2)

## 2021-02-14 LAB — CMP (CANCER CENTER ONLY)
ALT: 18 U/L (ref 0–44)
AST: 18 U/L (ref 15–41)
Albumin: 3.3 g/dL — ABNORMAL LOW (ref 3.5–5.0)
Alkaline Phosphatase: 102 U/L (ref 38–126)
Anion gap: 6 (ref 5–15)
BUN: 8 mg/dL (ref 8–23)
CO2: 26 mmol/L (ref 22–32)
Calcium: 8.4 mg/dL — ABNORMAL LOW (ref 8.9–10.3)
Chloride: 106 mmol/L (ref 98–111)
Creatinine: 0.63 mg/dL (ref 0.44–1.00)
GFR, Estimated: >60 mL/min (ref >60)
Glucose, Bld: 118 mg/dL — ABNORMAL HIGH (ref 70–99)
Potassium: 3.6 mmol/L (ref 3.5–5.1)
Sodium: 138 mmol/L (ref 135–145)
Total Bilirubin: 0.3 mg/dL (ref 0.3–1.2)
Total Protein: 6 g/dL — ABNORMAL LOW (ref 6.5–8.1)

## 2021-02-14 MED ORDER — SODIUM CHLORIDE 0.9% FLUSH
10.0000 mL | Freq: Once | INTRAVENOUS | Status: AC
  Administered 2021-02-14: 10 mL
  Filled 2021-02-14: qty 10

## 2021-02-14 MED ORDER — SODIUM CHLORIDE 0.9 % IV SOLN
800.0000 mg/m2 | Freq: Once | INTRAVENOUS | Status: AC
  Administered 2021-02-14: 1254 mg via INTRAVENOUS
  Filled 2021-02-14: qty 32.98

## 2021-02-14 MED ORDER — ONDANSETRON HCL 8 MG PO TABS: 8.0000 mg | ORAL_TABLET | Freq: Three times a day (TID) | ORAL | 2 refills | Status: DC | PRN

## 2021-02-14 MED ORDER — HEPARIN SOD (PORK) LOCK FLUSH 100 UNIT/ML IV SOLN
500.0000 [IU] | Freq: Once | INTRAVENOUS | Status: AC | PRN
  Administered 2021-02-14: 500 [IU]
  Filled 2021-02-14: qty 5

## 2021-02-14 MED ORDER — PROCHLORPERAZINE MALEATE 10 MG PO TABS
10.0000 mg | ORAL_TABLET | Freq: Once | ORAL | Status: AC
  Administered 2021-02-14: 10 mg via ORAL

## 2021-02-14 MED ORDER — SODIUM CHLORIDE 0.9% FLUSH
10.0000 mL | INTRAVENOUS | Status: DC | PRN
  Administered 2021-02-14: 10 mL
  Filled 2021-02-14: qty 10

## 2021-02-14 MED ORDER — PROCHLORPERAZINE MALEATE 10 MG PO TABS
ORAL_TABLET | ORAL | Status: AC
  Filled 2021-02-14: qty 1

## 2021-02-14 MED ORDER — MORPHINE SULFATE ER 100 MG PO TBCR: 100.0000 mg | EXTENDED_RELEASE_TABLET | Freq: Three times a day (TID) | ORAL | 0 refills | Status: DC

## 2021-02-14 MED ORDER — SODIUM CHLORIDE 0.9 % IV SOLN
Freq: Once | INTRAVENOUS | Status: AC
  Filled 2021-02-14: qty 250

## 2021-02-14 NOTE — Patient Instructions (Signed)
[facility] Discharge Instructions for Patients Receiving Chemotherapy  Today you received the following chemotherapy agents abraxane, gemcitabine  To help prevent nausea and vomiting after your treatment, we encourage you to take your nausea medication as directed. If you develop nausea and vomiting that is not controlled by your nausea medication, call the clinic.   BELOW ARE SYMPTOMS THAT SHOULD BE REPORTED IMMEDIATELY:  *FEVER GREATER THAN 100.5 F  *CHILLS WITH OR WITHOUT FEVER  NAUSEA AND VOMITING THAT IS NOT CONTROLLED WITH YOUR NAUSEA MEDICATION  *UNUSUAL SHORTNESS OF BREATH  *UNUSUAL BRUISING OR BLEEDING  TENDERNESS IN MOUTH AND THROAT WITH OR WITHOUT PRESENCE OF ULCERS  *URINARY PROBLEMS  *BOWEL PROBLEMS  UNUSUAL RASH Items with * indicate a potential emergency and should be followed up as soon as possible.  Feel free to call the clinic should you have any questions or concerns. The clinic phone number is [phone].  Please show the [facility] at check-in to the Emergency Department and triage nurse.

## 2021-02-14 NOTE — Patient Instructions (Signed)
Implanted Port Insertion, Care After This sheet gives you information about how to care for yourself after your procedure. Your health care provider may also give you more specific instructions. If you have problems or questions, contact your health care provider. What can I expect after the procedure? After the procedure, it is common to have:  Discomfort at the port insertion site.  Bruising on the skin over the port. This should improve over 3-4 days. Follow these instructions at home: Port care  After your port is placed, you will get a manufacturer's information card. The card has information about your port. Keep this card with you at all times.  Take care of the port as told by your health care provider. Ask your health care provider if you or a family member can get training for taking care of the port at home. A home health care nurse may also take care of the port.  Make sure to remember what type of port you have. Incision care  Follow instructions from your health care provider about how to take care of your port insertion site. Make sure you: ? Wash your hands with soap and water before and after you change your bandage (dressing). If soap and water are not available, use hand sanitizer. ? Change your dressing as told by your health care provider. ? Leave stitches (sutures), skin glue, or adhesive strips in place. These skin closures may need to stay in place for 2 weeks or longer. If adhesive strip edges start to loosen and curl up, you may trim the loose edges. Do not remove adhesive strips completely unless your health care provider tells you to do that.  Check your port insertion site every day for signs of infection. Check for: ? Redness, swelling, or pain. ? Fluid or blood. ? Warmth. ? Pus or a bad smell.      Activity  Return to your normal activities as told by your health care provider. Ask your health care provider what activities are safe for you.  Do not  lift anything that is heavier than 10 lb (4.5 kg), or the limit that you are told, until your health care provider says that it is safe. General instructions  Take over-the-counter and prescription medicines only as told by your health care provider.  Do not take baths, swim, or use a hot tub until your health care provider approves. Ask your health care provider if you may take showers. You may only be allowed to take sponge baths.  Do not drive for 24 hours if you were given a sedative during your procedure.  Wear a medical alert bracelet in case of an emergency. This will tell any health care providers that you have a port.  Keep all follow-up visits as told by your health care provider. This is important. Contact a health care provider if:  You cannot flush your port with saline as directed, or you cannot draw blood from the port.  You have a fever or chills.  You have redness, swelling, or pain around your port insertion site.  You have fluid or blood coming from your port insertion site.  Your port insertion site feels warm to the touch.  You have pus or a bad smell coming from the port insertion site. Get help right away if:  You have chest pain or shortness of breath.  You have bleeding from your port that you cannot control. Summary  Take care of the port as told by your   health care provider. Keep the manufacturer's information card with you at all times.  Change your dressing as told by your health care provider.  Contact a health care provider if you have a fever or chills or if you have redness, swelling, or pain around your port insertion site.  Keep all follow-up visits as told by your health care provider. This information is not intended to replace advice given to you by your health care provider. Make sure you discuss any questions you have with your health care provider. Document Revised: 06/14/2018 Document Reviewed: 06/14/2018 Elsevier Patient Education   2021 Elsevier Inc.  

## 2021-02-14 NOTE — Telephone Encounter (Signed)
Scheduled appt per 3/18 los - pt to get an updated schedule next visit.   

## 2021-02-14 NOTE — Progress Notes (Signed)
[place] OFFICE PROGRESS NOTE   Diagnosis: Pancreas cancer  INTERVAL HISTORY:   Vicki [name] returns as scheduled.  She completed cycle 2 gemcitabine/Abraxane 01/30/2021.  She was seen in an unscheduled visit 01/31/2021 for evaluation of dyspnea.  She was treated for a COPD exacerbation.  She notes some improvement in the dyspnea.  She continues the albuterol nebulizer treatments and an inhaler.  She had nausea for a few days after the chemotherapy.  No mouth sores though she did note some "tenderness".  She has Magic mouthwash to use as needed.  No diarrhea.  No fever or rash.  She notes increased neuropathy symptoms in the hands and feet.  She describes "stumbling", near falls, difficulty holding onto objects over the past 2 weeks.  Voice is less hoarse.  She continues to have intermittent back pain.  Recent pruritus intermittently over the back and arms.  Lotion helps temporarily.  She has not noticed a rash.  Objective:  Vital signs in last 24 hours:  Blood pressure 139/75, pulse 68, temperature 97.8 F (36.6 C), temperature source Tympanic, resp. rate 20, height 5\' 6"  (1.676 m), weight 119 lb 1.6 oz (54 kg), SpO2 96 %.    HEENT: No thrush or ulcers.  Sclera anicteric. Lymphatics: Less discrete masslike fullness at the left low neck. Resp: End inspiratory wheezes.  No respiratory distress. Cardio: Regular rate and rhythm. GI: Abdomen soft and nontender.  No hepatomegaly. Vascular: No leg edema. Neuro: Alert and oriented.  Vibratory sense moderately decreased over the fingertips per tuning fork exam. Skin: No leg edema. Port-A-Cath without erythema.   Lab Results:  Lab Results  Component Value Date   WBC 6.5 02/14/2021   HGB 10.0 (L) 02/14/2021   HCT 30.1 (L) 02/14/2021   MCV 92.0 02/14/2021   PLT 189 02/14/2021   NEUTROABS 4.6 02/14/2021    Imaging:  No results found.  Medications: I have reviewed the patient's current  medications.  Assessment/Plan: 1. 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 MRI abdomen 02/01/2020-2.2 x 3.3 cm mass in the posterior pancreas head/uncinate process, mild intrahepatic/extrahepatic ductal dilatation, no evidence of vascular invasion, possible small lymph nodes in the porta hepatis-poorly visualized ? EUS 02/29/2020-20 x 23 mm mass in the pancreas head, upstream pancreatic duct dilatation, 1 abnormal peripancreatic node, uT3?uN1 ? 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 CTs 03/19/2020-2.9 x 2.1 x 3.1 cm pancreas head/uncinate mass, lesion associated with infrarenal abdominal aorta and superior mesenteric artery, prominent and borderline enlarged retroperitoneal nodes including a left periaortic node, indeterminate 4 mm lung nodule ? Cycle 1 mFOLFIRINOX 03/08/80, complicated by n/v and hospital admission 5/2/ - 5/3 ? Second opinion at [facility] with Dr. Earnestine Mealing and Dr. Mariah Milling 04/02/20 ? PET scan 04/03/2020-hypermetabolic poorly marginated pancreatic head mass; 2 hypermetabolic left periaortic lymph nodes, hypermetabolic left supraclavicular lymph node ? Cycle 2 FOLFIRINOX 04/11/2020 ? Ultrasound-guided biopsy of a left supraclavicular mass on 04/18/2020-poorly differentiated adenocarcinoma, cytokeratin 7+, cytokeratin 20 and CDX2 positive in rare cells. ? Cycle 3 FOLFIRINOX 04/24/2020-oxaliplatin infusion lengthened to 3 hours, Udenyca was not given (ordered) ? Cycle 4 FOLFIRINOX 05/08/2020 ? Cycle 5 FOLFIRINOX 05/22/2020-atropine and Ativan premedication added secondary to cholinergic symptoms and nausea from irinotecan ? CTs 05/29/2020-decreased left supraclavicular lymph node, slight decrease in size of the pancreas head mass, decrease in retroperitoneal lymphadenopathy ? Cycle 6 FOLFIRINOX 06/05/2020 ? Cycle 7 FOLFIRINOX 06/26/2020 ? Cycle 8 FOLFIRINOX 07/11/2020 (oxaliplatin held secondary to neuropathy) ?  Cycle 9 FOLFIRINOX  07/24/2020 (oxaliplatin held secondary to neuropathy) ? Cycle 10 FOLFIRINOX 08/07/2020 (oxaliplatin held secondary to neuropathy) ? CTs 08/19/2020-subtle 5 mm peripheral right liver lesion, pancreas mass unchanged and difficult to discretely delineate, unchanged left retroperitoneal node, no evidence of disease progression, onDr. Sherrill'sreview of the CTs there is no change in the previously enlarged left supraclavicular node ? Cycle 11 FOLFIRINOX 09/18/2020 (oxaliplatin held secondary to neuropathy) ? Cycle 12 FOLFIRINOX 10/08/2020 (oxaliplatin held secondary to neuropathy) ? Cycle 13 FOLFIRINOX 10/30/2020 (oxaliplatin held secondary to neuropathy) ? CTs 11/14/2020-no evidence of thoracic metastases, slight enlargement of the pancreas head mass, increase in size of periaortic lymph node ? Cycle 14 FOLFIRINOX 11/25/2020 (oxaliplatin held secondary to neuropathy) ? Cycle 1 gemcitabine/Abraxane 01/08/2021 ? Cycle 2 gemcitabine/Abraxane 01/30/2021 ? Cycle 3 gemcitabine 02/14/2021, Abraxane held due to progressive neuropathy  2. Abdomen/back pain secondary to #1-progressive January 2022 3. Anorexia/weight loss secondary to #1 4. History of coronary artery disease,STEMI with the fib arrest 2011, status post RCA stent 5. Constipation, secondary to #1 and morphine 6. 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 Metal stent placed into the common bile duct 09/06/2020. 9.Hoarsenessstatus post vocal cord injections2/24/2022   Disposition: [name] appears stable.  She has completed 2 cycles of gemcitabine/Abraxane.  She has worsening neuropathy symptoms, interfering with activity.  We  decided to hold Abraxane today, proceed with gemcitabine alone.  She agrees with this plan.  We reviewed the CBC from today.  Counts adequate to proceed with treatment.  She will continue the current pain regimen consisting of MS Contin, oxycodone as needed.  MS Contin refill sent in today.  She will return for lab, follow-up, gemcitabine/Abraxane on 02/27/2021.  She will contact the office in the interim with any problems.    [name] ANP/GNP-BC   02/14/2021  11:03 AM

## 2021-02-17 MED FILL — MORPHINE SULF ER 100 MG TAB: 100 | 30 days supply | Qty: 90 | Fill #0

## 2021-02-17 MED FILL — OXYCODONE HCL 20 MG TABS: 20 | 22 days supply | Qty: 130 | Fill #0

## 2021-02-22 ENCOUNTER — Other Ambulatory Visit: Payer: Self-pay | Admitting: Oncology

## 2021-02-24 ENCOUNTER — Inpatient Hospital Stay (HOSPITAL_COMMUNITY)
Admission: EM | Admit: 2021-02-24 | Discharge: 2021-03-08 | DRG: 948 | Disposition: A | Discharge status: Hospice | Payer: Medicare Other | Attending: Internal Medicine | Admitting: Internal Medicine

## 2021-02-24 ENCOUNTER — Other Ambulatory Visit: Payer: Self-pay

## 2021-02-24 ENCOUNTER — Emergency Department (HOSPITAL_COMMUNITY): Payer: Medicare Other

## 2021-02-24 ENCOUNTER — Encounter (HOSPITAL_COMMUNITY): Payer: Self-pay

## 2021-02-24 ENCOUNTER — Telehealth: Payer: Self-pay

## 2021-02-24 DIAGNOSIS — T451X5A Adverse effect of antineoplastic and immunosuppressive drugs, initial encounter: Secondary | ICD-10-CM

## 2021-02-24 DIAGNOSIS — J449 Chronic obstructive pulmonary disease, unspecified: Secondary | ICD-10-CM | POA: Diagnosis present

## 2021-02-24 DIAGNOSIS — Z7189 Other specified counseling: Secondary | ICD-10-CM

## 2021-02-24 DIAGNOSIS — I252 Old myocardial infarction: Secondary | ICD-10-CM

## 2021-02-24 DIAGNOSIS — Z90710 Acquired absence of both cervix and uterus: Secondary | ICD-10-CM

## 2021-02-24 DIAGNOSIS — K52831 Collagenous colitis: Secondary | ICD-10-CM | POA: Diagnosis present

## 2021-02-24 DIAGNOSIS — C257 Malignant neoplasm of other parts of pancreas: Secondary | ICD-10-CM

## 2021-02-24 DIAGNOSIS — Z955 Presence of coronary angioplasty implant and graft: Secondary | ICD-10-CM

## 2021-02-24 DIAGNOSIS — Z515 Encounter for palliative care: Secondary | ICD-10-CM

## 2021-02-24 DIAGNOSIS — I10 Essential (primary) hypertension: Secondary | ICD-10-CM | POA: Diagnosis present

## 2021-02-24 DIAGNOSIS — Z91041 Radiographic dye allergy status: Secondary | ICD-10-CM

## 2021-02-24 DIAGNOSIS — Z9049 Acquired absence of other specified parts of digestive tract: Secondary | ICD-10-CM

## 2021-02-24 DIAGNOSIS — R64 Cachexia: Secondary | ICD-10-CM | POA: Diagnosis present

## 2021-02-24 DIAGNOSIS — R1013 Epigastric pain: Secondary | ICD-10-CM | POA: Diagnosis present

## 2021-02-24 DIAGNOSIS — R188 Other ascites: Secondary | ICD-10-CM | POA: Diagnosis present

## 2021-02-24 DIAGNOSIS — Z8674 Personal history of sudden cardiac arrest: Secondary | ICD-10-CM

## 2021-02-24 DIAGNOSIS — Z7982 Long term (current) use of aspirin: Secondary | ICD-10-CM

## 2021-02-24 DIAGNOSIS — R52 Pain, unspecified: Secondary | ICD-10-CM | POA: Diagnosis not present

## 2021-02-24 DIAGNOSIS — G893 Neoplasm related pain (acute) (chronic): Principal | ICD-10-CM | POA: Diagnosis present

## 2021-02-24 DIAGNOSIS — K219 Gastro-esophageal reflux disease without esophagitis: Secondary | ICD-10-CM | POA: Diagnosis present

## 2021-02-24 DIAGNOSIS — R112 Nausea with vomiting, unspecified: Secondary | ICD-10-CM | POA: Diagnosis present

## 2021-02-24 DIAGNOSIS — D689 Coagulation defect, unspecified: Secondary | ICD-10-CM | POA: Diagnosis present

## 2021-02-24 DIAGNOSIS — Z7902 Long term (current) use of antithrombotics/antiplatelets: Secondary | ICD-10-CM

## 2021-02-24 DIAGNOSIS — Z681 Body mass index (BMI) 19 or less, adult: Secondary | ICD-10-CM | POA: Diagnosis not present

## 2021-02-24 DIAGNOSIS — F419 Anxiety disorder, unspecified: Secondary | ICD-10-CM | POA: Diagnosis present

## 2021-02-24 DIAGNOSIS — E876 Hypokalemia: Secondary | ICD-10-CM | POA: Diagnosis not present

## 2021-02-24 DIAGNOSIS — C259 Malignant neoplasm of pancreas, unspecified: Secondary | ICD-10-CM | POA: Diagnosis present

## 2021-02-24 DIAGNOSIS — C779 Secondary and unspecified malignant neoplasm of lymph node, unspecified: Secondary | ICD-10-CM | POA: Diagnosis present

## 2021-02-24 DIAGNOSIS — Z87891 Personal history of nicotine dependence: Secondary | ICD-10-CM

## 2021-02-24 DIAGNOSIS — I2510 Atherosclerotic heart disease of native coronary artery without angina pectoris: Secondary | ICD-10-CM | POA: Diagnosis present

## 2021-02-24 DIAGNOSIS — Z888 Allergy status to other drugs, medicaments and biological substances status: Secondary | ICD-10-CM

## 2021-02-24 DIAGNOSIS — Z66 Do not resuscitate: Secondary | ICD-10-CM | POA: Diagnosis present

## 2021-02-24 DIAGNOSIS — R109 Unspecified abdominal pain: Secondary | ICD-10-CM

## 2021-02-24 DIAGNOSIS — R682 Dry mouth, unspecified: Secondary | ICD-10-CM | POA: Diagnosis present

## 2021-02-24 DIAGNOSIS — R21 Rash and other nonspecific skin eruption: Secondary | ICD-10-CM | POA: Diagnosis present

## 2021-02-24 DIAGNOSIS — Z20822 Contact with and (suspected) exposure to covid-19: Secondary | ICD-10-CM | POA: Diagnosis present

## 2021-02-24 DIAGNOSIS — E785 Hyperlipidemia, unspecified: Secondary | ICD-10-CM | POA: Diagnosis present

## 2021-02-24 DIAGNOSIS — Z79899 Other long term (current) drug therapy: Secondary | ICD-10-CM

## 2021-02-24 DIAGNOSIS — Z8507 Personal history of malignant neoplasm of pancreas: Secondary | ICD-10-CM

## 2021-02-24 DIAGNOSIS — R1084 Generalized abdominal pain: Secondary | ICD-10-CM | POA: Diagnosis not present

## 2021-02-24 DIAGNOSIS — K5900 Constipation, unspecified: Secondary | ICD-10-CM | POA: Diagnosis present

## 2021-02-24 DIAGNOSIS — Z8249 Family history of ischemic heart disease and other diseases of the circulatory system: Secondary | ICD-10-CM

## 2021-02-24 DIAGNOSIS — C250 Malignant neoplasm of head of pancreas: Secondary | ICD-10-CM | POA: Diagnosis not present

## 2021-02-24 DIAGNOSIS — R531 Weakness: Secondary | ICD-10-CM | POA: Diagnosis not present

## 2021-02-24 LAB — CBC WITH DIFFERENTIAL/PLATELET
Abs Immature Granulocytes: 0.02 10*3/uL (ref 0.00–0.07)
Basophils Absolute: 0 10*3/uL (ref 0.0–0.1)
Basophils Relative: 0 %
Eosinophils Absolute: 0 10*3/uL (ref 0.0–0.5)
Eosinophils Relative: 0 %
HCT: 30.9 % — ABNORMAL LOW (ref 36.0–46.0)
Hemoglobin: 10.3 g/dL — ABNORMAL LOW (ref 12.0–15.0)
Immature Granulocytes: 1 %
Lymphocytes Relative: 15 %
Lymphs Abs: 0.6 10*3/uL — ABNORMAL LOW (ref 0.7–4.0)
MCH: 30.5 pg (ref 26.0–34.0)
MCHC: 33.3 g/dL (ref 30.0–36.0)
MCV: 91.4 fL (ref 80.0–100.0)
Monocytes Absolute: 0.1 10*3/uL (ref 0.1–1.0)
Monocytes Relative: 3 %
Neutro Abs: 3.2 10*3/uL (ref 1.7–7.7)
Neutrophils Relative %: 81 %
Platelets: 226 10*3/uL (ref 150–400)
RBC: 3.38 MIL/uL — ABNORMAL LOW (ref 3.87–5.11)
RDW: 15.4 % (ref 11.5–15.5)
WBC: 4 10*3/uL (ref 4.0–10.5)
nRBC: 0 % (ref 0.0–0.2)

## 2021-02-24 LAB — COMPREHENSIVE METABOLIC PANEL
ALT: 22 U/L (ref 0–44)
AST: 28 U/L (ref 15–41)
Albumin: 3.4 g/dL — ABNORMAL LOW (ref 3.5–5.0)
Alkaline Phosphatase: 102 U/L (ref 38–126)
Anion gap: 11 (ref 5–15)
BUN: 6 mg/dL — ABNORMAL LOW (ref 8–23)
CO2: 27 mmol/L (ref 22–32)
Calcium: 8.3 mg/dL — ABNORMAL LOW (ref 8.9–10.3)
Chloride: 99 mmol/L (ref 98–111)
Creatinine, Ser: 0.31 mg/dL — ABNORMAL LOW (ref 0.44–1.00)
GFR, Estimated: >60 mL/min (ref >60)
Glucose, Bld: 121 mg/dL — ABNORMAL HIGH (ref 70–99)
Potassium: 2.5 mmol/L — CL (ref 3.5–5.1)
Sodium: 137 mmol/L (ref 135–145)
Total Bilirubin: 0.6 mg/dL (ref 0.3–1.2)
Total Protein: 6.1 g/dL — ABNORMAL LOW (ref 6.5–8.1)

## 2021-02-24 LAB — LIPASE, BLOOD: Lipase: 19 U/L (ref 11–51)

## 2021-02-24 LAB — SARS CORONAVIRUS 2 (TAT 6-24 HRS): SARS Coronavirus 2: NEGATIVE

## 2021-02-24 LAB — MAGNESIUM: Magnesium: 1.6 mg/dL — ABNORMAL LOW (ref 1.7–2.4)

## 2021-02-24 MED ORDER — POTASSIUM CHLORIDE 10 MEQ/100ML IV SOLN
10.0000 meq | INTRAVENOUS | Status: AC
  Administered 2021-02-24 (×3): 10 meq via INTRAVENOUS
  Filled 2021-02-24 (×3): qty 100

## 2021-02-24 MED ORDER — HYDROMORPHONE 1 MG/ML IV SOLN
INTRAVENOUS | Status: DC
  Filled 2021-02-24: qty 30

## 2021-02-24 MED ORDER — SODIUM CHLORIDE 0.9% FLUSH
10.0000 mL | INTRAVENOUS | Status: DC | PRN
  Administered 2021-02-28: 10 mL

## 2021-02-24 MED ORDER — SODIUM CHLORIDE 0.9% FLUSH: 9.0000 mL | INTRAVENOUS | Status: DC | PRN

## 2021-02-24 MED ORDER — POLYETHYLENE GLYCOL 3350 17 G PO PACK: 17.0000 g | PACK | Freq: Every day | ORAL | Status: DC | PRN

## 2021-02-24 MED ORDER — ONDANSETRON HCL 4 MG/2ML IJ SOLN
4.0000 mg | Freq: Once | INTRAMUSCULAR | Status: AC
  Administered 2021-02-24: 4 mg via INTRAVENOUS
  Filled 2021-02-24: qty 2

## 2021-02-24 MED ORDER — HYDROMORPHONE HCL 1 MG/ML IJ SOLN
0.5000 mg | Freq: Once | INTRAMUSCULAR | Status: AC
  Administered 2021-02-24: 0.5 mg via INTRAVENOUS
  Filled 2021-02-24: qty 1

## 2021-02-24 MED ORDER — HYDROMORPHONE HCL 1 MG/ML IJ SOLN: 0.5000 mg | Freq: Once | INTRAMUSCULAR | Status: DC

## 2021-02-24 MED ORDER — POTASSIUM CHLORIDE 10 MEQ/100ML IV SOLN
10.0000 meq | Freq: Once | INTRAVENOUS | Status: AC
  Administered 2021-02-24: 10 meq via INTRAVENOUS
  Filled 2021-02-24: qty 100

## 2021-02-24 MED ORDER — NALOXONE HCL 0.4 MG/ML IJ SOLN: 0.4000 mg | INTRAMUSCULAR | Status: DC | PRN

## 2021-02-24 MED ORDER — SODIUM CHLORIDE 0.9 % IV BOLUS
1000.0000 mL | Freq: Once | INTRAVENOUS | Status: AC
  Administered 2021-02-24: 1000 mL via INTRAVENOUS

## 2021-02-24 MED ORDER — HYDROMORPHONE HCL 1 MG/ML IJ SOLN
1.0000 mg | Freq: Once | INTRAMUSCULAR | Status: DC
Start: 2021-02-24 — End: 2021-02-24

## 2021-02-24 MED ORDER — ALBUTEROL SULFATE (2.5 MG/3ML) 0.083% IN NEBU: 2.5000 mg | INHALATION_SOLUTION | Freq: Four times a day (QID) | RESPIRATORY_TRACT | Status: DC | PRN

## 2021-02-24 MED ORDER — DIPHENHYDRAMINE HCL 50 MG/ML IJ SOLN
12.5000 mg | Freq: Four times a day (QID) | INTRAMUSCULAR | Status: DC | PRN
  Administered 2021-02-24 – 2021-02-25 (×2): 12.5 mg via INTRAVENOUS
  Filled 2021-02-24 (×2): qty 1

## 2021-02-24 MED ORDER — DIPHENHYDRAMINE HCL 12.5 MG/5ML PO ELIX
12.5000 mg | ORAL_SOLUTION | Freq: Four times a day (QID) | ORAL | Status: DC | PRN
  Filled 2021-02-24: qty 5

## 2021-02-24 MED ORDER — SODIUM CHLORIDE 0.9 % IV SOLN: INTRAVENOUS | Status: DC

## 2021-02-24 MED ORDER — LACTATED RINGERS IV SOLN: INTRAVENOUS | Status: AC

## 2021-02-24 MED ORDER — HYDROMORPHONE 1 MG/ML IV SOLN
INTRAVENOUS | Status: DC
  Administered 2021-02-24: 30 mg via INTRAVENOUS
  Administered 2021-02-25: 7.05 mg via INTRAVENOUS
  Administered 2021-02-25: 6.21 mg via INTRAVENOUS
  Administered 2021-02-25: 5.56 mg via INTRAVENOUS
  Administered 2021-02-25: 6.9 mg via INTRAVENOUS

## 2021-02-24 MED ORDER — HYDROMORPHONE HCL 1 MG/ML IJ SOLN
1.0000 mg | Freq: Once | INTRAMUSCULAR | Status: AC
  Administered 2021-02-24: 1 mg via INTRAVENOUS
  Filled 2021-02-24: qty 1

## 2021-02-24 MED ORDER — MAGNESIUM SULFATE 4 GM/100ML IV SOLN
4.0000 g | Freq: Once | INTRAVENOUS | Status: AC
  Administered 2021-02-24: 4 g via INTRAVENOUS
  Filled 2021-02-24: qty 100

## 2021-02-24 MED ORDER — ENOXAPARIN SODIUM 40 MG/0.4ML ~~LOC~~ SOLN
40.0000 mg | SUBCUTANEOUS | Status: DC
  Administered 2021-02-24 – 2021-03-07 (×12): 40 mg via SUBCUTANEOUS
  Filled 2021-02-24 (×12): qty 0.4

## 2021-02-24 MED ORDER — ONDANSETRON HCL 4 MG/2ML IJ SOLN
4.0000 mg | Freq: Four times a day (QID) | INTRAMUSCULAR | Status: DC | PRN
  Administered 2021-02-24 – 2021-02-25 (×3): 4 mg via INTRAVENOUS
  Filled 2021-02-24 (×3): qty 2

## 2021-02-24 NOTE — ED Notes (Signed)
Tech and Nurse changed pt and linen placed pt back on purewick

## 2021-02-24 NOTE — ED Notes (Signed)
Pt c/o pain, provider made aware

## 2021-02-24 NOTE — Telephone Encounter (Signed)
Patient's husband Merry Proud calls stating patient not doing well, vomiting all night, lethargic, difficult to arouse.  I instructed him to call EMS and have her taken to [facility] ED.  He verbalized an understanding.

## 2021-02-24 NOTE — ED Provider Notes (Addendum)
[facility] DEPT Provider Note   CSN: [id number] Arrival date & time: 02/24/21  1105     History Chief Complaint  Patient presents with  . Abdominal Pain    Vicki [name] is a 65 y.o. female.  64 year old female with history of pancreatic cancer presents with worsening epigastric pain.  States that she is on multiple opiates at home without relief.  Has had chronic pain from her pancreatic cancer.  Last chemotherapy was 2 weeks ago.  She has had nonbilious emesis with this without fever or chills.  No diarrhea noted.  Pain unrelieved with home medications.  Nothing makes her symptoms better        Past Medical History:  Diagnosis Date  . CAD (coronary artery disease) 2011   with stent to RCA and 60 % LAD disease  . Clotting disorder ([place])   . Dyspnea   . Family history of breast cancer   . Family history of cystic fibrosis   . GERD (gastroesophageal reflux disease)   . H/O cardiac arrest 02/15/10   with STEMI- Inf wall  . Hyperlipidemia   . Hypertension   . Myocardial infarct ([place]) 02/15/10  . pancreatic ca dx'd 02/2020  . Pneumonia   . Shock, cardiogenic ([place]) 01/2010   with MI, IABP    Patient Active Problem List   Diagnosis Date Noted  . Neck pain 11/18/2020  . Diarrhea 11/18/2020  . Abnormal findings on diagnostic imaging of other specified body structures 11/18/2020  . Port-A-Cath in place 09/12/2020  . Abdominal pain 09/06/2020  . Obstruction of biliary stent 09/06/2020  . Genetic testing 04/03/2020  . Chemotherapy induced nausea and vomiting 04/01/2020  . Hypokalemia due to excessive gastrointestinal loss of potassium 04/01/2020  . Pancreatic cancer ([place]) 03/20/2020  . Goals of care, counseling/discussion 03/20/2020  . Family history of breast cancer   . Family history of cystic fibrosis   . Foot pain, right 11/25/2018  . Collagenous colitis 02/08/2018  . Family history of Alzheimer's disease 02/08/2018  . Psoriasis  02/12/2015  . 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 Bradycardia, symptomatic with fatigue 09/13/2013  . Low back pain 02/23/2013  . Coronary artery disease involving native coronary artery of native heart without angina pectoris 05/30/2012  . Physical exam 05/04/2012  . Allergic rhinitis, seasonal 03/25/2012  . Hyperlipidemia 06/19/2010  . Essential hypertension 06/19/2010  . MYOCARDIAL INFARCTION, HX OF 06/19/2010    Past Surgical History:  Procedure Laterality Date  . ABDOMINAL HYSTERECTOMY  1987   BSO  . APPENDECTOMY  1987  . BILIARY STENT PLACEMENT N/A 03/05/2020   Procedure: BILIARY STENT PLACEMENT;  Surgeon: Clarene Essex, MD;  Location: [facility];  Service: Endoscopy;  Laterality: N/A;  . BILIARY STENT PLACEMENT N/A 09/06/2020   Procedure: BILIARY STENT PLACEMENT;  Surgeon: Clarene Essex, MD;  Location: [facility];  Service: Endoscopy;  Laterality: N/A;  . CORONARY ANGIOPLASTY WITH STENT PLACEMENT  02/15/10   Stent to prox RCA -BMS  . DOPPLER ECHOCARDIOGRAPHY  05/06/2010   EF =50-55%  lvfx low normal ;no sigificant valvular disease seen  . ENDOSCOPIC RETROGRADE CHOLANGIOPANCREATOGRAPHY (ERCP) WITH PROPOFOL N/A 02/29/2020   Procedure: ENDOSCOPIC RETROGRADE CHOLANGIOPANCREATOGRAPHY (ERCP) WITH PROPOFOL;  Surgeon: Arta Silence, MD;  Location: [facility];  Service: Endoscopy;  Laterality: N/A;  . ERCP N/A 03/05/2020   Procedure: ENDOSCOPIC RETROGRADE CHOLANGIOPANCREATOGRAPHY (ERCP);  Surgeon: Clarene Essex, MD;  Location: [facility];  Service: Endoscopy;  Laterality: N/A;  with stent placement  .  ERCP N/A 09/06/2020   Procedure: ENDOSCOPIC RETROGRADE CHOLANGIOPANCREATOGRAPHY (ERCP);  Surgeon: Clarene Essex, MD;  Location: [facility];  Service: Endoscopy;  Laterality: N/A;  . ESOPHAGOGASTRODUODENOSCOPY (EGD) WITH PROPOFOL N/A 02/29/2020   Procedure: ESOPHAGOGASTRODUODENOSCOPY (EGD) WITH PROPOFOL;  Surgeon: Arta Silence, MD;  Location: [facility];  Service:  Endoscopy;  Laterality: N/A;  . EXPLORATORY LAPAROTOMY    . FINE NEEDLE ASPIRATION  02/29/2020   Procedure: FINE NEEDLE ASPIRATION;  Surgeon: Arta Silence, MD;  Location: [facility];  Service: Endoscopy;;  . HERNIA REPAIR  1980   double bilateral  . IR IMAGING GUIDED PORT INSERTION  03/26/2020  . IR RADIOLOGIST EVAL & MGMT  12/17/2020  . MASS EXCISION  09/02/2012   Procedure: EXCISION MASS;  Surgeon: Earnstine Regal, MD;  Location: [facility];  Service: General;  Laterality: Left;  Excise mass Left posterior neck  . MICROLARYNGOSCOPY WITH CO2 LASER AND EXCISION OF VOCAL CORD LESION N/A 01/23/2021   Procedure: MICROLARYNGOSCOPY WITH Prolaryn injection;  Surgeon: Melida Quitter, MD;  Location: [facility];  Service: ENT;  Laterality: N/A;  . NM MYOCAR PERF WALL MOTION  05/06/2010   exercise cap 8 mets. ,mild perfusion defect in basal infer.,mid infer., apical infer., region consistent with infarct/scar. no significant ischemia  . PANCREATIC STENT PLACEMENT  02/29/2020   Procedure: PANCREATIC STENT PLACEMENT;  Surgeon: Arta Silence, MD;  [facility]: [facility];  Service: Endoscopy;;  . right chest wall exploration    . SPHINCTEROTOMY  02/29/2020   Procedure: SPHINCTEROTOMY;  Surgeon: Arta Silence, MD;  Location: [facility];  Service: Endoscopy;;  . [name]  03/05/2020   Procedure: [name];  Surgeon: Clarene Essex, MD;  Location: [facility];  Service: Endoscopy;;  . UPPER ESOPHAGEAL ENDOSCOPIC ULTRASOUND (EUS) N/A 02/29/2020   Procedure: UPPER ESOPHAGEAL ENDOSCOPIC ULTRASOUND (EUS)  LINEAR SCOPE;  Surgeon: Arta Silence, MD;  Location: [facility];  Service: Endoscopy;  Laterality: N/A;  DR. NEEDS TWO HOURS FOR PROCEDURE     OB History   No obstetric history on file.     Family History  Problem Relation Age of Onset  . Breast cancer Mother 42  . Dementia Mother   . Cystic fibrosis Father 28       late onset dx  . Healthy Brother   . Dementia Maternal Aunt   .  Dementia Maternal Uncle   . Other Paternal Uncle        MVA while in the [employer]  . Arthritis Maternal Grandmother   . AAA (abdominal aortic aneurysm) Maternal Grandfather   . Other Paternal 64        old age  . Heart attack Paternal Grandfather   . Liver disease Paternal Uncle   . Pancreatitis Son        several bouts of pancreatitis  . Breast cancer Cousin 28       mat first cousin  . Colon cancer Neg Hx     Social History   Tobacco Use  . Smoking status: Former Smoker    Types: Cigarettes    Quit date: 01/28/2010    Years since quitting: 11.0  . Smokeless tobacco: Never Used  Vaping Use  . Vaping Use: Never used  Substance Use Topics  . Alcohol use: Not Currently    Alcohol/week: 1.0 - 2.0 standard drink    Types: 1 - 2 Glasses of wine per week  . Drug use: No    Home Medications Prior to Admission medications   Medication Sig Start Date End  Date Taking? Authorizing Provider  acetaminophen (TYLENOL) 500 MG tablet Take 500 mg by mouth every 4 (four) hours as needed (pain).    [provider]  albuterol (PROVENTIL) (2.5 MG/3ML) 0.083% nebulizer solution Take 2.5 mg by nebulization every 6 (six) hours as needed for wheezing or shortness of breath.    [provider]  Albuterol Sulfate (PROAIR RESPICLICK) 258 (90 Base) MCG/ACT AEPB Inhale 1-2 puffs into the lungs every 6 (six) hours as needed (wheezing/shortness of breath).    [provider]  ALPRAZolam (XANAX) 0.25 MG tablet TAKE 1 TABLET(0.25 MG) BY MOUTH AT BEDTIME Patient taking differently: Take 0.25 mg by mouth at bedtime. 09/20/20   Midge Minium, MD  aspirin EC 81 MG tablet Take 81 mg by mouth at bedtime. Swallow whole.    [provider]  benzonatate (TESSALON PERLES) 100 MG capsule Take 1 capsule (100 mg total) by mouth 3 (three) times daily as needed. Patient taking differently: Take 100 mg by mouth 3 (three) times daily as needed for cough. 12/24/20   Lucretia Kern, DO  cetirizine (ZYRTEC) 10 MG tablet Take 10 mg by mouth daily.    [provider]  Cholecalciferol (VITAMIN D3) 5000 units CAPS Take 5,000 Units by mouth at bedtime.     [provider]  clopidogrel (PLAVIX) 75 MG tablet TAKE 1 TABLET(75 MG) BY MOUTH DAILY Patient taking differently: Take 75 mg by mouth daily. 01/06/21   Lorretta Harp, MD  dexamethasone (DECADRON) 4 MG tablet Take 2 tablets (8 mg total) by mouth 2 (two) times daily. X 3 days after each chemo. Start on day 2 06/26/20   Ladell Pier, MD  escitalopram (LEXAPRO) 10 MG tablet TAKE 1 TABLET(10 MG) BY MOUTH DAILY Patient taking differently: Take 5 mg by mouth in the morning and at bedtime. 07/08/20   Midge Minium, MD  gabapentin (NEURONTIN) 100 MG capsule TAKE 2 CAPSULES(200 MG) BY MOUTH AT BEDTIME Patient taking differently: Take 200 mg by mouth at bedtime. 11/15/20   Ladell Pier, MD  ibuprofen (ADVIL) 200 MG tablet Take 200 mg by mouth every 4 (four) hours as needed (pain).    [provider]  methylPREDNISolone (MEDROL DOSEPAK) 4 MG TBPK tablet Take as directed 01/31/21   Owens Shark, NP  metoprolol tartrate (LOPRESSOR) 50 MG tablet TAKE 1 TABLET(50 MG) BY MOUTH TWICE DAILY Patient taking differently: Take 25 mg by mouth 2 (two) times daily. 08/26/20   Lorretta Harp, MD  montelukast (SINGULAIR) 10 MG tablet TAKE 1 TABLET(10 MG) BY MOUTH AT BEDTIME 11/13/20   Midge Minium, MD  morphine (MS CONTIN) 100 MG 12 hr tablet Take 1 tablet (100 mg total) by mouth every 8 (eight) hours. 02/14/21   Owens Shark, NP  Omega-3 Fatty Acids (FISH OIL) 1000 MG CAPS Take 1,000 mg by mouth at bedtime.    [provider]  ondansetron (ZOFRAN) 8 MG tablet Take 1 tablet (8 mg total) by mouth every 8 (eight) hours as needed for nausea or vomiting. 02/14/21   Owens Shark, NP  Oxycodone HCl 20 MG TABS Take 1.5 tablets (30 mg total) by mouth every 4 (four) hours as needed. 02/10/21   Owens Shark, NP  Oxymetazoline HCl (VICKS SINEX SEVERE NA) Place 1 spray into both nostrils 2 (two) times daily as needed (congestion).    [provider]  pantoprazole (PROTONIX) 40 MG tablet TAKE 1 TABLET(40 MG) BY MOUTH TWICE  DAILY Patient taking differently: Take 40 mg by mouth 2 (two) times daily. 12/02/20   Lorretta Harp, MD  polyethylene glycol (MIRALAX / GLYCOLAX) 17 g packet Take 17 g by mouth daily as needed for mild constipation or moderate constipation.     [provider]  potassium chloride SA (KLOR-CON) 20 MEQ tablet TAKE 1 TABLET(20 MEQ) BY MOUTH DAILY Patient taking differently: Take 20 mEq by mouth daily. 03/15/20   Lorretta Harp, MD  rosuvastatin (CRESTOR) 10 MG tablet TAKE 1 TABLET(10 MG) BY MOUTH DAILY Patient taking differently: Take 10 mg by mouth daily. 01/06/21   Lorretta Harp, MD    Allergies    Chlorhexidine, Contrast media [iodinated diagnostic agents], and Oxaliplatin  Review of Systems   Review of Systems  All other systems reviewed and are negative.   Physical Exam Updated Vital Signs BP (!) 124/106 (BP Location: Right Arm)   Pulse 85   Temp 98.9 F (37.2 C) (Oral)   Resp (!) 22   Ht 1.676 m (5\' 6" )   Wt 54.4 kg   LMP  (LMP Unknown)   SpO2 100%   BMI 19.37 kg/m   Physical Exam Vitals and nursing note reviewed.  Constitutional:      General: She is not in acute distress.    Appearance: Normal appearance. She is well-developed. She is not toxic-appearing.  HENT:     Head: Normocephalic and atraumatic.  Eyes:     General: Lids are normal.     Conjunctiva/sclera: Conjunctivae normal.     Pupils: Pupils are equal, round, and reactive to light.  Neck:     Thyroid: No thyroid mass.     Trachea: No tracheal deviation.  Cardiovascular:     Rate and Rhythm: Normal rate and regular rhythm.     Heart sounds: Normal heart sounds. No murmur heard. No gallop.   Pulmonary:     Effort: Pulmonary effort is normal. No respiratory  distress.     Breath sounds: Normal breath sounds. No stridor. No decreased breath sounds, wheezing, rhonchi or rales.  Abdominal:     General: Bowel sounds are normal. There is no distension.     Palpations: Abdomen is soft.     Tenderness: There is abdominal tenderness in the epigastric area. There is guarding. There is no rebound.  Musculoskeletal:        General: No tenderness. Normal range of motion.     Cervical back: Normal range of motion and neck supple.  Skin:    General: Skin is warm and dry.     Findings: No abrasion or rash.  Neurological:     Mental Status: She is alert and oriented to person, place, and time.     GCS: GCS eye subscore is 4. GCS verbal subscore is 5. GCS motor subscore is 6.     Cranial Nerves: No cranial nerve deficit.     Sensory: No sensory deficit.  Psychiatric:        Speech: Speech normal.        Behavior: Behavior normal.     ED Results / Procedures / Treatments   Labs (all labs ordered are listed, but only abnormal results are displayed) Labs Reviewed  CBC WITH DIFFERENTIAL/PLATELET  COMPREHENSIVE METABOLIC PANEL  LIPASE, BLOOD    EKG None  Radiology No results found.  Procedures Procedures   Medications Ordered in ED Medications  sodium chloride 0.9 % bolus 1,000 mL (has no administration in time range)  0.9 %  sodium chloride infusion (has no administration in time range)  HYDROmorphone (DILAUDID) injection 0.5 mg (has no administration in time range)  HYDROmorphone (DILAUDID) injection 0.5 mg (has no administration in time range)  ondansetron (ZOFRAN) injection 4 mg (has no administration in time range)    ED Course  I have reviewed the triage vital signs and the nursing notes.  Pertinent labs & imaging results that were available during my care of the patient were reviewed by me and considered in my medical decision making (see chart for details).    MDM Rules/Calculators/A&P                          Patient given  IV hydration here.  Given multiple rounds of IV pain medication as well as antibiotics and continue to vomit.  Patient also found to be hypokalemic at 2.5 and given IV potassium.  Abdominal CT shows no evidence of obstruction.  Will admit to the hospital for IV potassium as well as pain management.  Secure chat message sent to oncology on-call Final Clinical Impression(s) / ED Diagnoses Final diagnoses:  None    Rx / DC Orders ED Discharge Orders    None       Lacretia Leigh, MD 02/24/21 1443    Lacretia Leigh, MD 02/24/21 1504

## 2021-02-24 NOTE — ED Notes (Signed)
Placed pt on purewick  

## 2021-02-24 NOTE — ED Triage Notes (Signed)
Pt w hx of pancreatic ca brought in by EMS for abd pain and n/v since yesterday. Pt was out of zofran at [facility] Given 4mg  en route by EMS. Denies diarrhea, last chemo treatment 2 weeks ago. Pt given 134ml NS

## 2021-02-24 NOTE — H&P (Addendum)
History and Physical        Hospital Admission Note Date: 02/24/2021  Patient name: Vicki [name] Medical record number: [id number] Date of birth: 1956/09/11 Age: 65 y.o. Gender: female  PCP: Midge Minium, MD   Chief Complaint    Chief Complaint  Patient presents with  . Abdominal Pain      HPI:   This is a 65 year old female with past medical history of pancreatic cancer with biliary stent on gemcitabine/Abraxane (last received gemcitabine on 3/18 and plan for chemo on 3/31), COPD, chronic cancer-related pain on chronic opiates, hypertension, hyperlipidemia, MI and cardiogenic shock, collagenous colitis who presented to the ED with emesis and abdominal pain since last night unrelieved by home medications.  States that she was in her usual state of health until yesterday evening when she had decreased appetite and was only taking in liquids.  Since that time all night she has been having nausea, vomiting and severe lower abdominal pain with radiation to her low back.  Also noted a new rash on her back which was pruritic yesterday however not today.  She has been unable to keep down any of her opiates and is concerned about being in withdrawal.  Admits to change in color of her stools which are more golden now.  She did have a bowel movement earlier today which was nonbloody.  Denies any fever, chills, night sweats or any other complaints.  Telemetry strip from EMS appears to be in sinus rhythm with frequent PVCs.   ED Course: Afebrile, tachypneic,  hypertensive, on room air. Notable Labs: Sodium 137, K2.5, BUN 6, creatinine 0.31, magnesium 1.6, LFTs unremarkable, WBC 4.0, Hb 10.3, COVID-19 pending. Notable Imaging: CT abdomen pelvis without contrast-pancreatic head mass appear significantly enlarged consistent with worsening primary pancreatic adenocarcinoma, left periaortic lymph  nodes appear increased in size compared to prior concerning for worsening nodal metastatic disease, CBD stent with post stenting pneumobilia and small volume ascites and anasarca. Patient received Dilaudid, Zofran, 1 L NS bolus and KCl.    Vitals:   02/24/21 1530 02/24/21 1553  BP: (!) 150/71   Pulse: (!) 111 80  Resp: 14   Temp:    SpO2: 95% 98%     Review of Systems:  Review of Systems  All other systems reviewed and are negative.   Medical/Social/Family History   Past Medical History: Past Medical History:  Diagnosis Date  . CAD (coronary artery disease) 2011   with stent to RCA and 60 % LAD disease  . Clotting disorder ([place])   . Dyspnea   . Family history of breast cancer   . Family history of cystic fibrosis   . GERD (gastroesophageal reflux disease)   . H/O cardiac arrest 02/15/10   with STEMI- Inf wall  . Hyperlipidemia   . Hypertension   . Myocardial infarct ([place]) 02/15/10  . pancreatic ca dx'd 02/2020  . Pneumonia   . Shock, cardiogenic ([place]) 01/2010   with MI, IABP    Past Surgical History:  Procedure Laterality Date  . ABDOMINAL HYSTERECTOMY  1987   BSO  . APPENDECTOMY  1987  . BILIARY STENT PLACEMENT N/A 03/05/2020   Procedure: BILIARY STENT PLACEMENT;  Surgeon: Watt Climes,  Altamese Dilling, MD;  Location: [facility];  Service: Endoscopy;  Laterality: N/A;  . BILIARY STENT PLACEMENT N/A 09/06/2020   Procedure: BILIARY STENT PLACEMENT;  Surgeon: Clarene Essex, MD;  Location: [facility];  Service: Endoscopy;  Laterality: N/A;  . CORONARY ANGIOPLASTY WITH STENT PLACEMENT  02/15/10   Stent to prox RCA -BMS  . DOPPLER ECHOCARDIOGRAPHY  05/06/2010   EF =50-55%  lvfx low normal ;no sigificant valvular disease seen  . ENDOSCOPIC RETROGRADE CHOLANGIOPANCREATOGRAPHY (ERCP) WITH PROPOFOL N/A 02/29/2020   Procedure: ENDOSCOPIC RETROGRADE CHOLANGIOPANCREATOGRAPHY (ERCP) WITH PROPOFOL;  Surgeon: Arta Silence, MD;  Location: [facility];  Service: Endoscopy;  Laterality: N/A;  . ERCP  N/A 03/05/2020   Procedure: ENDOSCOPIC RETROGRADE CHOLANGIOPANCREATOGRAPHY (ERCP);  Surgeon: Clarene Essex, MD;  Location: [facility];  Service: Endoscopy;  Laterality: N/A;  with stent placement  . ERCP N/A 09/06/2020   Procedure: ENDOSCOPIC RETROGRADE CHOLANGIOPANCREATOGRAPHY (ERCP);  Surgeon: Clarene Essex, MD;  Location: [facility];  Service: Endoscopy;  Laterality: N/A;  . ESOPHAGOGASTRODUODENOSCOPY (EGD) WITH PROPOFOL N/A 02/29/2020   Procedure: ESOPHAGOGASTRODUODENOSCOPY (EGD) WITH PROPOFOL;  Surgeon: Arta Silence, MD;  Location: [facility];  Service: Endoscopy;  Laterality: N/A;  . EXPLORATORY LAPAROTOMY    . FINE NEEDLE ASPIRATION  02/29/2020   Procedure: FINE NEEDLE ASPIRATION;  Surgeon: Arta Silence, MD;  Location: [facility];  Service: Endoscopy;;  . HERNIA REPAIR  1980   double bilateral  . IR IMAGING GUIDED PORT INSERTION  03/26/2020  . IR RADIOLOGIST EVAL & MGMT  12/17/2020  . MASS EXCISION  09/02/2012   Procedure: EXCISION MASS;  Surgeon: Earnstine Regal, MD;  Location: [facility];  Service: General;  Laterality: Left;  Excise mass Left posterior neck  . MICROLARYNGOSCOPY WITH CO2 LASER AND EXCISION OF VOCAL CORD LESION N/A 01/23/2021   Procedure: MICROLARYNGOSCOPY WITH Prolaryn injection;  Surgeon: Melida Quitter, MD;  Location: [facility];  Service: ENT;  Laterality: N/A;  . NM MYOCAR PERF WALL MOTION  05/06/2010   exercise cap 8 mets. ,mild perfusion defect in basal infer.,mid infer., apical infer., region consistent with infarct/scar. no significant ischemia  . PANCREATIC STENT PLACEMENT  02/29/2020   Procedure: PANCREATIC STENT PLACEMENT;  Surgeon: Arta Silence, MD;  Location: [facility];  Service: Endoscopy;;  . right chest wall exploration    . SPHINCTEROTOMY  02/29/2020   Procedure: SPHINCTEROTOMY;  Surgeon: Arta Silence, MD;  Location: [facility];  Service: Endoscopy;;  . [name]  03/05/2020   Procedure: [name];  Surgeon: Clarene Essex, MD;   Location: [facility];  Service: Endoscopy;;  . UPPER ESOPHAGEAL ENDOSCOPIC ULTRASOUND (EUS) N/A 02/29/2020   Procedure: UPPER ESOPHAGEAL ENDOSCOPIC ULTRASOUND (EUS)  LINEAR SCOPE;  Surgeon: Arta Silence, MD;  Location: [facility];  Service: Endoscopy;  Laterality: N/A;  DR. NEEDS TWO HOURS FOR PROCEDURE    Medications: Prior to Admission medications   Medication Sig Start Date End Date Taking? Authorizing Provider  acetaminophen (TYLENOL) 500 MG tablet Take 500 mg by mouth every 4 (four) hours as needed (pain).    [provider]  albuterol (PROVENTIL) (2.5 MG/3ML) 0.083% nebulizer solution Take 2.5 mg by nebulization every 6 (six) hours as needed for wheezing or shortness of breath.    [provider]  Albuterol Sulfate (PROAIR RESPICLICK) 267 (90 Base) MCG/ACT AEPB Inhale 1-2 puffs into the lungs every 6 (six) hours as needed (wheezing/shortness of breath).    [provider]  ALPRAZolam (XANAX) 0.25 MG tablet TAKE 1 TABLET(0.25 MG) BY MOUTH AT BEDTIME Patient taking differently: Take  0.25 mg by mouth at bedtime. 09/20/20   Midge Minium, MD  aspirin EC 81 MG tablet Take 81 mg by mouth at bedtime. Swallow whole.    [provider]  benzonatate (TESSALON PERLES) 100 MG capsule Take 1 capsule (100 mg total) by mouth 3 (three) times daily as needed. Patient taking differently: Take 100 mg by mouth 3 (three) times daily as needed for cough. 12/24/20   Lucretia Kern, DO  cetirizine (ZYRTEC) 10 MG tablet Take 10 mg by mouth daily.    [provider]  Cholecalciferol (VITAMIN D3) 5000 units CAPS Take 5,000 Units by mouth at bedtime.     [provider]  clopidogrel (PLAVIX) 75 MG tablet TAKE 1 TABLET(75 MG) BY MOUTH DAILY Patient taking differently: Take 75 mg by mouth daily. 01/06/21   Lorretta Harp, MD  dexamethasone (DECADRON) 4 MG tablet Take 2 tablets (8 mg total) by mouth 2 (two) times daily. X 3 days after each chemo. Start on  day 2 06/26/20   Ladell Pier, MD  escitalopram (LEXAPRO) 10 MG tablet TAKE 1 TABLET(10 MG) BY MOUTH DAILY Patient taking differently: Take 5 mg by mouth in the morning and at bedtime. 07/08/20   Midge Minium, MD  gabapentin (NEURONTIN) 100 MG capsule TAKE 2 CAPSULES(200 MG) BY MOUTH AT BEDTIME Patient taking differently: Take 200 mg by mouth at bedtime. 11/15/20   Ladell Pier, MD  ibuprofen (ADVIL) 200 MG tablet Take 200 mg by mouth every 4 (four) hours as needed (pain).    [provider]  methylPREDNISolone (MEDROL DOSEPAK) 4 MG TBPK tablet Take as directed 01/31/21   Owens Shark, NP  metoprolol tartrate (LOPRESSOR) 50 MG tablet TAKE 1 TABLET(50 MG) BY MOUTH TWICE DAILY Patient taking differently: Take 25 mg by mouth 2 (two) times daily. 08/26/20   Lorretta Harp, MD  montelukast (SINGULAIR) 10 MG tablet TAKE 1 TABLET(10 MG) BY MOUTH AT BEDTIME 11/13/20   Midge Minium, MD  morphine (MS CONTIN) 100 MG 12 hr tablet Take 1 tablet (100 mg total) by mouth every 8 (eight) hours. 02/14/21   Owens Shark, NP  Omega-3 Fatty Acids (FISH OIL) 1000 MG CAPS Take 1,000 mg by mouth at bedtime.    [provider]  ondansetron (ZOFRAN) 8 MG tablet Take 1 tablet (8 mg total) by mouth every 8 (eight) hours as needed for nausea or vomiting. 02/14/21   Owens Shark, NP  Oxycodone HCl 20 MG TABS Take 1.5 tablets (30 mg total) by mouth every 4 (four) hours as needed. 02/10/21   Owens Shark, NP  Oxymetazoline HCl (VICKS SINEX SEVERE NA) Place 1 spray into both nostrils 2 (two) times daily as needed (congestion).    [provider]  pantoprazole (PROTONIX) 40 MG tablet TAKE 1 TABLET(40 MG) BY MOUTH TWICE DAILY Patient taking differently: Take 40 mg by mouth 2 (two) times daily. 12/02/20   Lorretta Harp, MD  polyethylene glycol (MIRALAX / GLYCOLAX) 17 g packet Take 17 g by mouth daily as needed for mild constipation or moderate constipation.     [provider]  potassium chloride SA (KLOR-CON) 20 MEQ tablet TAKE 1 TABLET(20 MEQ) BY MOUTH DAILY Patient taking differently: Take 20 mEq by mouth daily. 03/15/20   Lorretta Harp, MD  rosuvastatin (CRESTOR) 10 MG tablet TAKE 1 TABLET(10 MG) BY MOUTH DAILY Patient taking differently: Take 10 mg by mouth daily. 01/06/21   Quay Burow  J, MD    Allergies:   Allergies  Allergen Reactions  . Chlorhexidine     Do not add, port not accessed  . Contrast Media [Iodinated Diagnostic Agents] Hives    CT IV contrast- pt states she still has itching even with 4 hour and 13 hour preps 09/06/20  . Oxaliplatin     If pt receives Oxaliplatin please inf over 3 hrs.    Social History:  reports that she quit smoking about 11 years ago. Her smoking use included cigarettes. She has never used smokeless tobacco. She reports previous alcohol use of about 1.0 - 2.0 standard drink of alcohol per week. She reports that she does not use drugs.  Family History: Family History  Problem Relation Age of Onset  . Breast cancer Mother 68  . Dementia Mother   . Cystic fibrosis Father 72       late onset dx  . Healthy Brother   . Dementia Maternal Aunt   . Dementia Maternal Uncle   . Other Paternal Uncle        MVA while in the [employer]  . Arthritis Maternal Grandmother   . AAA (abdominal aortic aneurysm) Maternal Grandfather   . Other Paternal 12        old age  . Heart attack Paternal Grandfather   . Liver disease Paternal Uncle   . Pancreatitis Son        several bouts of pancreatitis  . Breast cancer Cousin 68       mat first cousin  . Colon cancer Neg Hx      Objective   Physical Exam: Blood pressure (!) 150/71, pulse 80, temperature 98.9 F (37.2 C), temperature source Oral, resp. rate 14, height 5\' 6"  (1.676 m), weight 54.4 kg, SpO2 98 %.  Physical Exam Vitals and nursing note reviewed. Exam conducted with a chaperone present.  Constitutional:      Comments: Appears in significant pain  with occasional dry heaving Cachectic  HENT:     Head: Normocephalic.  Cardiovascular:     Rate and Rhythm: Normal rate and regular rhythm.  Pulmonary:     Effort: Pulmonary effort is normal. No respiratory distress.  Abdominal:     General: Abdomen is flat. Bowel sounds are decreased.     Tenderness: There is abdominal tenderness.  Musculoskeletal:        General: No swelling or tenderness.     Right lower leg: No edema.     Left lower leg: No edema.  Skin:    Findings: Rash present.  Neurological:     Mental Status: She is alert. Mental status is at baseline.  Psychiatric:        Mood and Affect: Mood normal.        Behavior: Behavior normal.       LABS on Admission: I have personally reviewed all the labs and imaging below    Basic Metabolic Panel: Recent Labs  Lab 02/24/21 1139  NA 137  K 2.5*  CL 99  CO2 27  GLUCOSE 121*  BUN 6*  CREATININE 0.31*  CALCIUM 8.3*  MG 1.6*   Liver Function Tests: Recent Labs  Lab 02/24/21 1139  AST 28  ALT 22  ALKPHOS 102  BILITOT 0.6  PROT 6.1*  ALBUMIN 3.4*   Recent Labs  Lab 02/24/21 1139  LIPASE 19   No results for input(s): AMMONIA in the last 168 hours. CBC: Recent Labs  Lab 02/24/21 1139  WBC 4.0  NEUTROABS 3.2  HGB 10.3*  HCT 30.9*  MCV 91.4  PLT 226   Cardiac Enzymes: No results for input(s): CKTOTAL, CKMB, CKMBINDEX, TROPONINI in the last 168 hours. BNP: Invalid input(s): POCBNP CBG: No results for input(s): GLUCAP in the last 168 hours.  Radiological Exams on Admission:  CT Abdomen Pelvis Wo Contrast  Result Date: 02/24/2021 CLINICAL DATA:  Abdominal distension, back and abdominal pain, pancreatic cancer EXAM: CT ABDOMEN AND PELVIS WITHOUT CONTRAST TECHNIQUE: Multidetector CT imaging of the abdomen and pelvis was performed following the standard protocol without IV contrast. COMPARISON:  11/14/2020 FINDINGS: Lower chest: No acute abnormality. Hepatobiliary: No solid liver abnormality is  seen. No gallstones or gallbladder wall thickening. Redemonstrated common bile duct stent with post stenting pneumobilia. Pancreas: Although ill-defined and difficult to clearly evaluate, particularly by noncontrast examination, pancreatic head mass appears significantly enlarged, overall dimensions of the pancreatic head approximately 4.4 x 4.3 cm, previously 3.1 x 2.9 cm when measured similarly (series 2, image 25). Spleen: Normal in size without significant abnormality. Adrenals/Urinary Tract: Adrenal glands are unremarkable. Kidneys are normal, without renal calculi, solid lesion, or hydronephrosis. Bladder is unremarkable. Stomach/Bowel: Stomach is within normal limits. Appendix appears normal. No evidence of bowel wall thickening, distention, or inflammatory changes. Vascular/Lymphatic: Aortic atherosclerosis. Although indistinct and difficult to precisely measure, enlarged, matted left periaortic lymph nodes appear increased in size compared to prior examination, measuring approximately 3.5 x 2.7 cm, previously 2.4 x 2.1 cm when measured similarly (series 2, image 23). Reproductive: No mass or other significant abnormality. Other: No abdominal wall hernia or abnormality. Anasarca. Small volume ascites. Musculoskeletal: No acute or significant osseous findings. IMPRESSION: 1. Although ill-defined and difficult to clearly evaluate, particularly by noncontrast examination, pancreatic head mass appears significantly enlarged compared to prior examination, consistent with worsened primary pancreatic adenocarcinoma. 2. Although indistinct and difficult to precisely measure, enlarged, matted left periaortic lymph nodes appear increased in size compared to prior examination, concerning for worsened nodal metastatic disease. 3. Redemonstrated common bile duct stent with post stenting pneumobilia. 4. Small volume ascites and anasarca. Aortic Atherosclerosis (ICD10-I70.0). Electronically Signed   By: Eddie Candle M.D.    On: 02/24/2021 13:46   DG ABD ACUTE 2+V W 1V CHEST  Result Date: 02/24/2021 CLINICAL DATA:  Worsening abdominal pain with nausea and vomiting. History of pancreatic cancer. EXAM: DG ABDOMEN ACUTE WITH 1 VIEW CHEST COMPARISON:  Chest radiographs 01/31/2021.  Abdominal CT 11/14/2020. FINDINGS: Frontal chest radiograph demonstrates mild patient rotation to the right. Right IJ Port-A-Cath appears unchanged at the level of the superior cavoatrial junction. The heart size and mediastinal contours are stable allowing for the rotation. The lungs are clear. There is no pleural effusion or pneumothorax. Metallic biliary stent appears unchanged in position. Associated expected pneumobilia appears less evident. The bowel gas pattern is normal. There is no free intraperitoneal air. There is stable mild degenerative changes in the spine. IMPRESSION: 1. No acute cardiopulmonary or abdominal process identified. 2. Stable position of the biliary stent. Pneumobilia not well visualized; if concern of biliary obstruction, consider further evaluation with follow-up CT. Electronically Signed   By: Richardean Sale M.D.   On: 02/24/2021 12:12      EKG: Ordered not done   A & P   Principal Problem:   Cancer related pain Active Problems:   Hyperlipidemia   Essential hypertension   Coronary artery disease involving native coronary artery of native heart without angina pectoris   Pancreatic cancer (HCC)   Hypokalemia due to  excessive gastrointestinal loss of potassium   Hypomagnesemia   1. Acute on chronic pancreatic cancer-related abdominal pain with nausea and vomiting a. Unfortunately, CT scan shows progression of pancreatic cancer and nodal metastatic disease with small volume ascites and anasarca b. Pain uncontrolled with bolus dosing of Dilaudid c. At home she is on MS Contin 100 mg every 8 hours and oxycodone 30 mg every 4 hours as needed which she is taking regularly d. Start Dilaudid PCA pump: 2 mg with  loading dose, 0.65 mg/h basal rate, 0.5 mg bolus dose with 10-minute lockout.  Discussed with pharmacy e. Palliative care consulted f. Oncology consulted, discussed with Dr. Benay Spice g. Gentle IV fluids h. Hold antiemetics for now pending QT check  2. Hypokalemia likely from GI losses a. Replete IV b. Check EKG and continue telemetry  3. Hypomagnesemia a. Replete IV  4. Adenocarcinoma of the pancreas with suspected nodal mets and biliary stent a. Biliary stent does not appear obstructed on lab work or imaging b. Oncology consulted  5. Rash of unclear etiology a. Currently asymptomatic b. Continue to monitor  6. COPD, not in acute exacerbation a. Continue home albuterol b. Unable to tolerate p.o. Singulair right now  7. Hypertension a. BP elevated likely from pain and not tolerating p.o. meds b. Monitor off meds for now she will be on a PCA pump  8. Hyperlipidemia  CAD with history of STEMI a. Holding home aspirin/statin for now. Restart when taking PO    DVT prophylaxis: Lovenox   Code Status: DNR  Diet: Clear liquid diet as tolerated  family Communication: Admission, patients condition and plan of care including tests being ordered have been discussed with the patient who indicates understanding and agrees with the plan and Code Status. Patient's husband was updated  Disposition Plan: The appropriate patient status for this patient is INPATIENT. Inpatient status is judged to be reasonable and necessary in order to provide the required intensity of service to ensure the patient's safety. The patient's presenting symptoms, physical exam findings, and initial radiographic and laboratory data in the context of their chronic comorbidities is felt to place them at high risk for further clinical deterioration. Furthermore, it is not anticipated that the patient will be medically stable for discharge from the hospital within 2 midnights of admission. The following factors support the  patient status of inpatient.   " The patient's presenting symptoms include abdominal pain, nausea and vomiting. " The worrisome physical exam findings include severe abdominal pain. " The initial radiographic and laboratory data are worrisome because of worsening pancreatic cancer, hypokalemia and hypomagnesemia. " The chronic co-morbidities include pancreatic cancer.   * I certify that at the point of admission it is my clinical judgment that the patient will require inpatient hospital care spanning beyond 2 midnights from the point of admission due to high intensity of service, high risk for further deterioration and high frequency of surveillance required.*     The medical decision making on this patient was of high complexity and the patient is at high risk for clinical deterioration, therefore this is a level 3  admission.  Consultants  . Oncology . Palliative care  Procedures  . PCA pump  Time Spent on Admission: 73 minutes    Harold Hedge, DO [facility]  02/24/2021, 5:07 PM

## 2021-02-25 DIAGNOSIS — I10 Essential (primary) hypertension: Secondary | ICD-10-CM | POA: Diagnosis not present

## 2021-02-25 DIAGNOSIS — G893 Neoplasm related pain (acute) (chronic): Principal | ICD-10-CM

## 2021-02-25 DIAGNOSIS — T451X5A Adverse effect of antineoplastic and immunosuppressive drugs, initial encounter: Secondary | ICD-10-CM

## 2021-02-25 DIAGNOSIS — R112 Nausea with vomiting, unspecified: Secondary | ICD-10-CM | POA: Diagnosis not present

## 2021-02-25 LAB — CBC
HCT: 32.3 % — ABNORMAL LOW (ref 36.0–46.0)
Hemoglobin: 11.1 g/dL — ABNORMAL LOW (ref 12.0–15.0)
MCH: 30.7 pg (ref 26.0–34.0)
MCHC: 34.4 g/dL (ref 30.0–36.0)
MCV: 89.2 fL (ref 80.0–100.0)
Platelets: 250 10*3/uL (ref 150–400)
RBC: 3.62 MIL/uL — ABNORMAL LOW (ref 3.87–5.11)
RDW: 15.9 % — ABNORMAL HIGH (ref 11.5–15.5)
WBC: 5.5 10*3/uL (ref 4.0–10.5)
nRBC: 0 % (ref 0.0–0.2)

## 2021-02-25 LAB — MAGNESIUM: Magnesium: 2 mg/dL (ref 1.7–2.4)

## 2021-02-25 LAB — COMPREHENSIVE METABOLIC PANEL
ALT: 20 U/L (ref 0–44)
AST: 22 U/L (ref 15–41)
Albumin: 3.1 g/dL — ABNORMAL LOW (ref 3.5–5.0)
Alkaline Phosphatase: 93 U/L (ref 38–126)
Anion gap: 10 (ref 5–15)
BUN: 6 mg/dL — ABNORMAL LOW (ref 8–23)
CO2: 28 mmol/L (ref 22–32)
Calcium: 8 mg/dL — ABNORMAL LOW (ref 8.9–10.3)
Chloride: 95 mmol/L — ABNORMAL LOW (ref 98–111)
Creatinine, Ser: 0.5 mg/dL (ref 0.44–1.00)
GFR, Estimated: >60 mL/min (ref >60)
Glucose, Bld: 99 mg/dL (ref 70–99)
Potassium: 2.6 mmol/L — CL (ref 3.5–5.1)
Sodium: 133 mmol/L — ABNORMAL LOW (ref 135–145)
Total Bilirubin: 0.7 mg/dL (ref 0.3–1.2)
Total Protein: 5.8 g/dL — ABNORMAL LOW (ref 6.5–8.1)

## 2021-02-25 MED ORDER — BISACODYL 5 MG PO TBEC: 5.0000 mg | DELAYED_RELEASE_TABLET | Freq: Every day | ORAL | Status: DC | PRN

## 2021-02-25 MED ORDER — POTASSIUM CHLORIDE 20 MEQ PO PACK
40.0000 meq | PACK | Freq: Two times a day (BID) | ORAL | Status: DC
  Administered 2021-02-25: 40 meq via ORAL
  Filled 2021-02-25: qty 2

## 2021-02-25 MED ORDER — TRAZODONE HCL 50 MG PO TABS
50.0000 mg | ORAL_TABLET | Freq: Every day | ORAL | Status: DC
Start: 2021-02-25 — End: 2021-02-26
  Filled 2021-02-25: qty 1

## 2021-02-25 MED ORDER — HYDROMORPHONE HCL 1 MG/ML IJ SOLN
1.0000 mg | Freq: Once | INTRAMUSCULAR | Status: AC
  Administered 2021-02-25: 1 mg via INTRAVENOUS
  Filled 2021-02-25: qty 1

## 2021-02-25 MED ORDER — ZOLPIDEM TARTRATE 5 MG PO TABS: 5.0000 mg | ORAL_TABLET | Freq: Every evening | ORAL | Status: DC | PRN

## 2021-02-25 MED ORDER — POTASSIUM CHLORIDE 10 MEQ/100ML IV SOLN
10.0000 meq | INTRAVENOUS | Status: AC
  Administered 2021-02-25 (×6): 10 meq via INTRAVENOUS
  Filled 2021-02-25 (×6): qty 100

## 2021-02-25 MED ORDER — ONDANSETRON HCL 4 MG/2ML IJ SOLN
4.0000 mg | Freq: Four times a day (QID) | INTRAMUSCULAR | Status: DC
  Administered 2021-02-25 – 2021-02-27 (×7): 4 mg via INTRAVENOUS
  Filled 2021-02-25 (×7): qty 2

## 2021-02-25 MED ORDER — DEXAMETHASONE SODIUM PHOSPHATE 4 MG/ML IJ SOLN
4.0000 mg | Freq: Two times a day (BID) | INTRAMUSCULAR | Status: DC
  Administered 2021-02-25 – 2021-02-26 (×4): 4 mg via INTRAVENOUS
  Filled 2021-02-25 (×4): qty 1

## 2021-02-25 MED ORDER — POTASSIUM CHLORIDE CRYS ER 20 MEQ PO TBCR
40.0000 meq | EXTENDED_RELEASE_TABLET | Freq: Two times a day (BID) | ORAL | Status: DC
  Filled 2021-02-25: qty 2

## 2021-02-25 MED ORDER — HYDROMORPHONE 1 MG/ML IV SOLN
INTRAVENOUS | Status: DC
Start: 2021-02-25 — End: 2021-02-26
  Administered 2021-02-25: 30 mg via INTRAVENOUS
  Administered 2021-02-26: 5.18 mg via INTRAVENOUS
  Administered 2021-02-26: 3.99 mg via INTRAVENOUS
  Filled 2021-02-25: qty 30

## 2021-02-25 MED ORDER — SODIUM CHLORIDE 0.9 % IV SOLN
25.0000 mg | Freq: Three times a day (TID) | INTRAVENOUS | Status: DC | PRN
  Administered 2021-02-25 – 2021-02-26 (×3): 25 mg via INTRAVENOUS
  Filled 2021-02-25 (×5): qty 1

## 2021-02-25 NOTE — Progress Notes (Signed)
Called by RN who reported patient having 8 out of 10 pain despite being on PCA.  PCA pump is set at 0.5 mg dose of Dilaudid every 10 minutes with a lockout of 1.2 mg every hour.  At that setting patient will be above within 30 minutes. Patient was aggressively for sleep and states that Ambien does not help her. Adjusted PCA pump to 0.5 mg of Dilaudid every 15 minutes with 2 mg dose per hour. Dulcolax ordered per patient request. Trazodone 50 mg nightly for sleep ordered

## 2021-02-25 NOTE — Progress Notes (Signed)
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 Acute on chronic pancreatic cancer-related abdominal pain with nausea and vomiting a. CT scan had demonstrated progression of pancreatic cancer and nodal metastatic disease with small volume ascites and anasarca b. At home she is on MS Contin 100 mg every 8 hours and oxycodone 30 mg every 4 hours as needed which she is taking regularly with little relief c. Now on dilaudid PCA d. Palliative care consulted e. Oncology consulted, appreciate input. f. Gentle IV fluids  2. Hypokalemia likely from GI losses a. Remains low, will replace b. Repeat bmet in AM  3. Hypomagnesemia a. Relaced b. Would cont to monitor levels and replace as needed  4. Adenocarcinoma of the pancreas with suspected nodal mets and biliary stent a. Biliary stent does not appear obstructed on lab work or imaging b. Oncology following  5. Rash of unclear etiology a. Currently asymptomatic b. Continue to monitor  6. COPD, not in acute exacerbation a. Continue home albuterol b. On minimal O2 support at this time  7. Hypertension a. BP elevated likely from pain  and not tolerating p.o. meds b. No bp meds on home med rec c. Will continue on PRN hydralazine as needed  8. Hyperlipidemia  CAD with history of STEMI a. Held home aspirin/statin for now. Would restart when pt is reliably tolerating PO  DVT prophylaxis: Lovenox subq Code Status: DNR Family Communication: Pt in room, family at bedside  Status is: Inpatient  Remains inpatient appropriate because:IV treatments appropriate due to intensity of illness or inability to take PO and Inpatient level of care appropriate due to severity of illness   Dispo: The patient is from: Home              Anticipated d/c is to: Home              Patient currently is not medically stable to d/c.   Difficult to place patient No       Consultants:   Oncology  Palliative Care  Procedures:     Antimicrobials: Anti-infectives (From admission, onward)   None       Subjective: Feeling very tired. Reports not sleeping over the past 2 days  Objective: Vitals:   02/25/21 0014 02/25/21 0411 02/25/21 0417 02/25/21 1337  BP:  140/83  (!) 151/103  Pulse:  86  90  Resp: 14 14 14 18   Temp:  98.3 F (36.8 C)  98.7 F (37.1 C)  TempSrc:  Oral  Oral  SpO2: 99% 99% 99% 99%  Weight:      Height:        Intake/Output Summary (Last 24 hours) at 02/25/2021 1545 Last data filed at 02/25/2021 1400 Gross per 24 hour  Intake 1346.81  ml  Output 1301 ml  Net 45.81 ml   Filed Weights   02/24/21 1115  Weight: 54.4 kg    Examination: General exam: Awake, laying in bed, in nad Respiratory system: Normal respiratory effort, no wheezing Cardiovascular system: regular rate, s1, s2 Gastrointestinal system: Soft, nondistended, positive BS Central nervous system: CN2-12 grossly intact, strength intact Extremities: Perfused, no clubbing Skin: Normal skin turgor, no notable skin lesions seen Psychiatry: Mood normal // no visual hallucinations    Data Reviewed: I have personally reviewed following  labs and imaging studies  CBC: Recent Labs  Lab 02/24/21 1139 02/25/21 1032  WBC 4.0 5.5  NEUTROABS 3.2  --   HGB 10.3* 11.1*  HCT 30.9* 32.3*  MCV 91.4 89.2  PLT 226 086   Basic Metabolic Panel: Recent Labs  Lab 02/24/21 1139 02/25/21 1032  NA 137 133*  K 2.5* 2.6*  CL 99 95*  CO2 27 28  GLUCOSE 121* 99  BUN 6* 6*  CREATININE 0.31* 0.50  CALCIUM 8.3* 8.0*  MG 1.6* 2.0   GFR: Estimated Creatinine Clearance: 59.4 mL/min (by C-G formula based on SCr of 0.5 mg/dL). Liver Function Tests: Recent Labs  Lab 02/24/21 1139 02/25/21 1032  AST 28 22  ALT 22 20  ALKPHOS 102 93  BILITOT 0.6 0.7  PROT 6.1* 5.8*  ALBUMIN 3.4* 3.1*   Recent Labs  Lab 02/24/21 1139  LIPASE 19   No results for input(s): AMMONIA in the last 168 hours. Coagulation Profile: No results for input(s): INR, PROTIME in the last 168 hours. Cardiac Enzymes: No results for input(s): CKTOTAL, CKMB, CKMBINDEX, TROPONINI in the last 168 hours. BNP (last 3 results) No results for input(s): PROBNP in the last 8760 hours. HbA1C: No results for input(s): HGBA1C in the last 72 hours. CBG: No results for input(s): GLUCAP in the last 168 hours. Lipid Profile: No results for input(s): CHOL, HDL, LDLCALC, TRIG, CHOLHDL, LDLDIRECT in the last 72 hours. Thyroid Function Tests: No results for input(s): TSH, T4TOTAL, FREET4, T3FREE, THYROIDAB in the last 72 hours. Anemia Panel: No results for input(s): VITAMINB12, FOLATE, FERRITIN, TIBC, IRON, RETICCTPCT in the last 72 hours. Sepsis Labs: No results for input(s): PROCALCITON, LATICACIDVEN in the last 168 hours.  Recent Results (from the past 240 hour(s))  SARS CORONAVIRUS 2 (TAT 6-24 HRS) Nasopharyngeal Nasopharyngeal Swab     Status: None   Collection Time: 02/24/21  3:04 PM   Specimen: Nasopharyngeal Swab  Result Value Ref Range Status   SARS Coronavirus 2 NEGATIVE NEGATIVE Final    Comment: (NOTE) SARS-CoV-2 target nucleic acids are NOT  DETECTED.  The SARS-CoV-2 RNA is generally detectable in upper and lower respiratory specimens during the acute phase of infection. Negative results do not preclude SARS-CoV-2 infection, do not rule out co-infections with other pathogens, and should not be used as the sole basis for treatment or other patient management decisions. Negative results must be combined with clinical observations, patient history, and epidemiological information. The expected result is Negative.  Fact Sheet for Patients: SugarRoll.be  Fact Sheet for Healthcare Providers: https://www.woods-mathews.com/  This test is not yet approved or cleared by the [place] FDA and  has been authorized for detection and/or diagnosis of SARS-CoV-2 by FDA under an Emergency Use Authorization (EUA). This EUA will remain  in effect (meaning this test can be used) for the duration of the COVID-19 declaration under Se ction 564(b)(1) of the Act, 21 U.S.C. section 360bbb-3(b)(1), unless the authorization is terminated or revoked sooner.  Performed at [facility], [place] [address], [place], [place] [address]      Radiology Studies: CT Abdomen Pelvis Wo Contrast  Result Date: 02/24/2021 CLINICAL DATA:  Abdominal distension, back and abdominal pain, pancreatic cancer EXAM: CT ABDOMEN AND PELVIS WITHOUT CONTRAST TECHNIQUE: Multidetector CT imaging of the abdomen and pelvis was performed following the standard protocol without IV contrast. COMPARISON:  11/14/2020 FINDINGS: Lower chest: No acute abnormality. Hepatobiliary: No solid liver abnormality is seen. No gallstones or gallbladder wall thickening. Redemonstrated common bile duct stent with post stenting pneumobilia. Pancreas: Although ill-defined and difficult to clearly evaluate, particularly by noncontrast examination, pancreatic head mass appears significantly enlarged, overall dimensions of the pancreatic head approximately  4.4 x 4.3 cm, previously 3.1 x 2.9 cm when measured similarly (series 2, image 25). Spleen: Normal in size without significant abnormality. Adrenals/Urinary Tract: Adrenal glands are unremarkable. Kidneys are normal, without renal calculi, solid lesion, or hydronephrosis. Bladder is unremarkable. Stomach/Bowel: Stomach is within normal limits. Appendix appears normal. No evidence of bowel wall thickening, distention, or inflammatory changes. Vascular/Lymphatic: Aortic atherosclerosis. Although indistinct and difficult to precisely measure, enlarged, matted left periaortic lymph nodes appear increased in size compared to prior examination, measuring approximately 3.5 x 2.7 cm, previously 2.4 x 2.1 cm when measured similarly (series 2, image 23). Reproductive: No mass or other significant abnormality. Other: No abdominal wall hernia or abnormality. Anasarca. Small volume ascites. Musculoskeletal: No acute or significant osseous findings. IMPRESSION: 1. Although ill-defined and difficult to clearly evaluate, particularly by noncontrast examination, pancreatic head mass appears significantly enlarged compared to prior examination, consistent with worsened primary pancreatic adenocarcinoma. 2. Although indistinct and difficult to precisely measure, enlarged, matted left periaortic lymph nodes appear increased in size compared to prior examination, concerning for worsened nodal metastatic disease. 3. Redemonstrated common bile duct stent with post stenting pneumobilia. 4. Small volume ascites and anasarca. Aortic Atherosclerosis (ICD10-I70.0). Electronically Signed   By: Eddie Candle M.D.   On: 02/24/2021 13:46   DG ABD ACUTE 2+V W 1V CHEST  Result Date: 02/24/2021 CLINICAL DATA:  Worsening abdominal pain with nausea and vomiting. History of pancreatic cancer. EXAM: DG ABDOMEN ACUTE WITH 1 VIEW CHEST COMPARISON:  Chest radiographs 01/31/2021.  Abdominal CT 11/14/2020. FINDINGS: Frontal chest radiograph demonstrates  mild patient rotation to the right. Right IJ Port-A-Cath appears unchanged at the level of the superior cavoatrial junction. The heart size and mediastinal contours are stable allowing for the rotation. The lungs are clear. There is no pleural effusion or pneumothorax. Metallic biliary stent appears unchanged in position. Associated expected pneumobilia appears less evident. The bowel gas pattern is normal. There is no free intraperitoneal air. There is stable mild degenerative changes in the spine. IMPRESSION: 1. No acute cardiopulmonary or abdominal process identified. 2. Stable position of the biliary stent. Pneumobilia not well visualized; if concern of biliary obstruction, consider further evaluation with follow-up CT. Electronically Signed   By: Richardean Sale M.D.   On: 02/24/2021 12:12    Scheduled Meds: . dexamethasone (DECADRON) injection  4 mg Intravenous Q12H  . enoxaparin (LOVENOX) injection  40 mg Subcutaneous Q24H  . HYDROmorphone   Intravenous Q4H  . ondansetron (ZOFRAN) IV  4 mg Intravenous Q6H   Continuous Infusions: . potassium chloride 10 mEq (02/25/21 1521)  . promethazine (PHENERGAN) injection 25 mg (02/25/21 1030)     LOS: 1 day   Marylu Lund, MD [facility] Pager On Amion  If 7PM-7AM, please contact night-coverage 02/25/2021, 3:45 PM

## 2021-02-25 NOTE — Progress Notes (Signed)
   02/25/21 1930  Provider Notification  Provider Name/Title Harrold Donath  Date Provider Notified 02/25/21  Time Provider Notified 1930  Notification Type Page  Notification Reason Other (Comment) (Patient & family stating she has been in pain all day despite PCA, had been waiting on Pallative team, asking for sleeping pill other than ambien, and dulcolax pills)  Provider response See new orders (MD ordered now dose of IV diluadid, new PCA settings, Trazadone, and dulcolax per patient request)  Date of Provider Response 02/25/21  Time of Provider Response [phone]

## 2021-02-25 NOTE — Progress Notes (Addendum)
HEMATOLOGY-ONCOLOGY PROGRESS NOTE  SUBJECTIVE: [name] is well-known to our practice.  We follow her for pancreatic cancer.  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  Last chemo was given on 02/14/2021.  She is now admitted with abdominal pain, nausea, vomiting.  She has been started on Dilaudid PCA.  She has as needed antiemetics available to her.  She also has a rash on her legs, back, chest.  Rash is pruritic.  Oncology History  Pancreatic cancer ([place])  03/20/2020 Initial Diagnosis   Pancreatic cancer ([place])   03/28/2020 - 11/27/2020 Chemotherapy         03/31/2020 Genetic Testing   Negative genetic testing on the common hereditary cancer panel and pancreatitis cancer panel.  The custom Hereditary Gene Panel offered by [facility] includes sequencing and/or deletion duplication testing of the following 56 genes: APC*, ATM*, AXIN2, BARD1, BMPR1A, BRCA1, BRCA2, BRIP1, CASR, CDH1, CDK4, CDKN2A (p14ARF), CDKN2A (p16INK4a), CFTR*, CHEK2, CPA1, CTNNA1, CTRC, DICER1*, EPCAM*, FANCC, GREM1*, HOXB13, KIT, MEN1*, MLH1*, MSH2*, MSH3*, MSH6*, MUTYH, NBN, NF1*, NTHL1, PALB2, PALLD, PDGFRA, PMS2*, POLD1*, POLE, PRSS1*, PTEN*, RAD50, RAD51C, RAD51D, RNF43, SDHA*, SDHB, SDHC*, SDHD, SMAD4, SMARCA4, SPINK1, STK11, TP53, TSC1*, TSC2, and VHL The report date is 03/31/2020.   01/08/2021 -  Chemotherapy    Patient is on Treatment Plan: PANCREATIC ABRAXANE / GEMCITABINE D1,8,15 Q28D       PHYSICAL EXAMINATION:  Vitals:   02/25/21 0411 02/25/21 0417  BP: 140/83   Pulse: 86   Resp: 14 14  Temp: 98.3 F (36.8 C)   SpO2: 99% 99%   Filed Weights   02/24/21 1115  Weight: 54.4 kg    Intake/Output from previous day: 03/28 0701 - 03/29 0700 In: 2346.8 [P.O.:200; I.V.:788.6; IV Piggyback:1358.2] Out: 1001 [Urine:1000; Stool:1]  GENERAL: Laying in bed, moaning at times due to pain SKIN:  Erythematous rash noted to back, legs, chest OROPHARYNX:no exudate, no erythema and lips, buccal mucosa, and tongue normal  LUNGS: clear to auscultation and percussion with normal breathing effort HEART: regular rate & rhythm and no murmurs and no lower extremity edema ABDOMEN: Positive bowel sounds, soft, tenderness with palpation. NEURO: alert & oriented x 3 with fluent speech, no focal motor/sensory deficits  LABORATORY DATA:  I have reviewed the data as listed CMP Latest Ref Rng & Units 02/24/2021 02/14/2021 01/30/2021  Glucose 70 - 99 mg/dL 121(H) 118(H) 105(H)  BUN 8 - 23 mg/dL 6(L) 8 11  Creatinine 0.44 - 1.00 mg/dL 0.31(L) 0.63 0.63  Sodium 135 - 145 mmol/L 137 138 138  Potassium 3.5 - 5.1 mmol/L 2.5(LL) 3.6 3.4(L)  Chloride 98 - 111 mmol/L 99 106 109  CO2 22 - 32 mmol/L '27 26 23  ' Calcium 8.9 - 10.3 mg/dL 8.3(L) 8.4(L) 8.1(L)  Total Protein 6.5 - 8.1 g/dL 6.1(L) 6.0(L) 5.9(L)  Total Bilirubin 0.3 - 1.2 mg/dL 0.6 0.3 0.2(L)  Alkaline Phos 38 - 126 U/L 102 102 106  AST 15 - 41 U/L '28 18 18  ' ALT 0 - 44 U/L '22 18 14    ' Lab Results  Component Value Date   WBC 4.0 02/24/2021   HGB 10.3 (L) 02/24/2021   HCT 30.9 (L) 02/24/2021   MCV 91.4 02/24/2021   PLT 226 02/24/2021   NEUTROABS 3.2 02/24/2021    CT Abdomen Pelvis Wo Contrast  Result Date: 02/24/2021 CLINICAL DATA:  Abdominal distension, back and abdominal pain, pancreatic cancer EXAM: CT ABDOMEN  AND PELVIS WITHOUT CONTRAST TECHNIQUE: Multidetector CT imaging of the abdomen and pelvis was performed following the standard protocol without IV contrast. COMPARISON:  11/14/2020 FINDINGS: Lower chest: No acute abnormality. Hepatobiliary: No solid liver abnormality is seen. No gallstones or gallbladder wall thickening. Redemonstrated common bile duct stent with post stenting pneumobilia. Pancreas: Although ill-defined and difficult to clearly evaluate, particularly by noncontrast examination, pancreatic head mass appears significantly  enlarged, overall dimensions of the pancreatic head approximately 4.4 x 4.3 cm, previously 3.1 x 2.9 cm when measured similarly (series 2, image 25). Spleen: Normal in size without significant abnormality. Adrenals/Urinary Tract: Adrenal glands are unremarkable. Kidneys are normal, without renal calculi, solid lesion, or hydronephrosis. Bladder is unremarkable. Stomach/Bowel: Stomach is within normal limits. Appendix appears normal. No evidence of bowel wall thickening, distention, or inflammatory changes. Vascular/Lymphatic: Aortic atherosclerosis. Although indistinct and difficult to precisely measure, enlarged, matted left periaortic lymph nodes appear increased in size compared to prior examination, measuring approximately 3.5 x 2.7 cm, previously 2.4 x 2.1 cm when measured similarly (series 2, image 23). Reproductive: No mass or other significant abnormality. Other: No abdominal wall hernia or abnormality. Anasarca. Small volume ascites. Musculoskeletal: No acute or significant osseous findings. IMPRESSION: 1. Although ill-defined and difficult to clearly evaluate, particularly by noncontrast examination, pancreatic head mass appears significantly enlarged compared to prior examination, consistent with worsened primary pancreatic adenocarcinoma. 2. Although indistinct and difficult to precisely measure, enlarged, matted left periaortic lymph nodes appear increased in size compared to prior examination, concerning for worsened nodal metastatic disease. 3. Redemonstrated common bile duct stent with post stenting pneumobilia. 4. Small volume ascites and anasarca. Aortic Atherosclerosis (ICD10-I70.0). Electronically Signed   By: Eddie Candle M.D.   On: 02/24/2021 13:46   DG Chest 2 View  Result Date: 01/31/2021 CLINICAL DATA:  Shortness of breath and a cough for 2 days. Pancreatic cancer. EXAM: CHEST - 2 VIEW COMPARISON:  11/14/2020 chest CT.  09/12/2020 plain film. FINDINGS: Hyperinflation. Midline trachea.  Normal heart size. Atherosclerosis in the transverse aorta. No pleural effusion or pneumothorax. Diffuse peribronchial thickening. No lobar consolidation. Biliary stent. No free intraperitoneal air. IMPRESSION: 1. No acute cardiopulmonary disease. 2. Hyperinflation and interstitial thickening, most consistent with COPD/chronic bronchitis. Electronically Signed   By: Abigail Miyamoto M.D.   On: 01/31/2021 13:25   DG ABD ACUTE 2+V W 1V CHEST  Result Date: 02/24/2021 CLINICAL DATA:  Worsening abdominal pain with nausea and vomiting. History of pancreatic cancer. EXAM: DG ABDOMEN ACUTE WITH 1 VIEW CHEST COMPARISON:  Chest radiographs 01/31/2021.  Abdominal CT 11/14/2020. FINDINGS: Frontal chest radiograph demonstrates mild patient rotation to the right. Right IJ Port-A-Cath appears unchanged at the level of the superior cavoatrial junction. The heart size and mediastinal contours are stable allowing for the rotation. The lungs are clear. There is no pleural effusion or pneumothorax. Metallic biliary stent appears unchanged in position. Associated expected pneumobilia appears less evident. The bowel gas pattern is normal. There is no free intraperitoneal air. There is stable mild degenerative changes in the spine. IMPRESSION: 1. No acute cardiopulmonary or abdominal process identified. 2. Stable position of the biliary stent. Pneumobilia not well visualized; if concern of biliary obstruction, consider further evaluation with follow-up CT. Electronically Signed   By: Richardean Sale M.D.   On: 02/24/2021 12:12    ASSESSMENT AND PLAN: 1. 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 MRI abdomen 02/01/2020-2.2 x 3.3 cm mass in the posterior pancreas head/uncinate process, mild intrahepatic/extrahepatic ductal  dilatation, no evidence of vascular invasion, possible small lymph nodes in the porta hepatis-poorly visualized ? EUS 02/29/2020-20 x 23 mm mass in the pancreas head, upstream  pancreatic duct dilatation, 1 abnormal peripancreatic node, uT3?uN1 ? 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 CTs 03/19/2020-2.9 x 2.1 x 3.1 cm pancreas head/uncinate mass, lesion associated with infrarenal abdominal aorta and superior mesenteric artery, prominent and borderline enlarged retroperitoneal nodes including a left periaortic node, indeterminate 4 mm lung nodule ? Cycle 1 mFOLFIRINOX 3/42/87, complicated by n/v and hospital admission 5/2/ - 5/3 ? Second opinion at [facility] with Dr. Earnestine Mealing and Dr. Mariah Milling 04/02/20 ? PET scan 04/03/2020-hypermetabolic poorly marginated pancreatic head mass; 2 hypermetabolic left periaortic lymph nodes, hypermetabolic left supraclavicular lymph node ? Cycle 2 FOLFIRINOX 04/11/2020 ? Ultrasound-guided biopsy of a left supraclavicular mass on 04/18/2020-poorly differentiated adenocarcinoma, cytokeratin 7+, cytokeratin 20 and CDX2 positive in rare cells. ? Cycle 3 FOLFIRINOX 04/24/2020-oxaliplatin infusion lengthened to 3 hours, Udenyca was not given (ordered) ? Cycle 4 FOLFIRINOX 05/08/2020 ? Cycle 5 FOLFIRINOX 05/22/2020-atropine and Ativan premedication added secondary to cholinergic symptoms and nausea from irinotecan ? CTs 05/29/2020-decreased left supraclavicular lymph node, slight decrease in size of the pancreas head mass, decrease in retroperitoneal lymphadenopathy ? Cycle 6 FOLFIRINOX 06/05/2020 ? Cycle 7 FOLFIRINOX 06/26/2020 ? Cycle 8 FOLFIRINOX 07/11/2020 (oxaliplatin held secondary to neuropathy) ? Cycle 9 FOLFIRINOX 07/24/2020 (oxaliplatin held secondary to neuropathy) ? Cycle 10 FOLFIRINOX 08/07/2020 (oxaliplatin held secondary to neuropathy) ? CTs 08/19/2020-subtle 5 mm peripheral right liver lesion, pancreas mass unchanged and difficult to discretely delineate, unchanged left retroperitoneal node, no evidence of disease progression, onDr. Seraphine [name]'sreview of the CTs there is no change in the previously enlarged left  supraclavicular node ? Cycle 11 FOLFIRINOX 09/18/2020 (oxaliplatin held secondary to neuropathy) ? Cycle 12 FOLFIRINOX 10/08/2020 (oxaliplatin held secondary to neuropathy) ? Cycle 13 FOLFIRINOX 10/30/2020 (oxaliplatin held secondary to neuropathy) ? CTs 11/14/2020-no evidence of thoracic metastases, slight enlargement of the pancreas head mass, increase in size of periaortic lymph node ? Cycle 14 FOLFIRINOX 11/25/2020 (oxaliplatin held secondary to neuropathy) ? Cycle 1 gemcitabine/Abraxane 01/08/2021 ? Cycle 2 gemcitabine/Abraxane 01/30/2021 ? Cycle 3 gemcitabine 02/14/2021, Abraxane held due to progressive neuropathy  2. Abdomen/back pain secondary to #1-progressive January 2022 3. Anorexia/weight loss secondary to #1 4. History of coronary artery disease,STEMI with the fib arrest 2011, status post RCA stent 5. Constipation, secondary to #1 and morphine 6. 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 Metal stent placed into the common bile duct 09/06/2020. 9.Hoarsenessstatus post vocal cord injections2/24/2022  Vicki [name] has been admitted due to abdominal pain, nausea, vomiting.  CT scans independently reviewed and there is slight increase noted in her pancreatic mass.  However, there was a gap in time between when the last CT scan was done and she started treatment with gemcitabine/Abraxane.  Unclear if current scan truly represents disease progression.  Etiology of her abdominal pain is unclear but could be related to underlying malignancy.  Agree with Dilaudid PCA and adjust medications as needed.  A palliative care consult is also been requested to help with pain  control.  She also has nausea and vomiting and is not eating very well.  We will schedule Zofran and and dexamethasone 4 mg IV  twice daily.  She also has as needed Phenergan available to her.  Recommendations: 1.  Continue Dilaudid PCA and adjust as needed.  Resume oral narcotics when nausea has improved 2.  Palliative care to see the patient to help with pain management. 3.  Scheduled Zofran and add dexamethasone 4 mg twice daily.  Continue as needed Phenergan. 4.  IV fluids for hydration. 5.  Recommend dietitian consult. 6.  Trial of Decadron for nausea and pain   LOS: 1 day   Mikey Bussing, DNP, AGPCNP-BC, AOCNP 02/25/21 Vicki [name] was interviewed and examined.  I reviewed the admission CT images.  She has a history of metastatic pancreas cancer, currently being treated with second line gemcitabine/Abraxane.  She was admitted with nausea/vomiting and increased abdominal pain.  The CT images are consistent with progression of the pancreas mass and adjacent adenopathy, but the comparison CT was obtained approximately 6 weeks prior to beginning the current regimen.  My overall clinical impression is she is developing progressive disease while on gemcitabine/Abraxane.  The pain is likely secondary to the locally advanced pancreas tumor.  She was referred for a celiac block and declined this in the past.  I recommend continuing MS Contin/oxycodone and Dilaudid for now.  The etiology of the acute nausea/vomiting is unclear.  This is most likely unrelated to chemotherapy.  The nausea is most likely secondary to the pancreas cancer.  We should consider CNS imaging if the nausea persists.  The etiology of the skin rash is unclear.  The rash does not appear typical of a gemcitabine rash.  I was present for greater than 50% of today's visit.  I performed medical decision making.

## 2021-02-26 ENCOUNTER — Other Ambulatory Visit: Payer: Self-pay

## 2021-02-26 DIAGNOSIS — R531 Weakness: Secondary | ICD-10-CM

## 2021-02-26 DIAGNOSIS — G893 Neoplasm related pain (acute) (chronic): Secondary | ICD-10-CM | POA: Diagnosis not present

## 2021-02-26 DIAGNOSIS — C259 Malignant neoplasm of pancreas, unspecified: Secondary | ICD-10-CM | POA: Diagnosis not present

## 2021-02-26 DIAGNOSIS — Z515 Encounter for palliative care: Secondary | ICD-10-CM

## 2021-02-26 DIAGNOSIS — E876 Hypokalemia: Secondary | ICD-10-CM | POA: Diagnosis not present

## 2021-02-26 DIAGNOSIS — K5900 Constipation, unspecified: Secondary | ICD-10-CM

## 2021-02-26 DIAGNOSIS — R109 Unspecified abdominal pain: Secondary | ICD-10-CM | POA: Diagnosis not present

## 2021-02-26 DIAGNOSIS — I2510 Atherosclerotic heart disease of native coronary artery without angina pectoris: Secondary | ICD-10-CM | POA: Diagnosis not present

## 2021-02-26 DIAGNOSIS — J449 Chronic obstructive pulmonary disease, unspecified: Secondary | ICD-10-CM

## 2021-02-26 DIAGNOSIS — R52 Pain, unspecified: Secondary | ICD-10-CM

## 2021-02-26 LAB — COMPREHENSIVE METABOLIC PANEL
ALT: 20 U/L (ref 0–44)
AST: 20 U/L (ref 15–41)
Albumin: 3.4 g/dL — ABNORMAL LOW (ref 3.5–5.0)
Alkaline Phosphatase: 97 U/L (ref 38–126)
Anion gap: 13 (ref 5–15)
BUN: 13 mg/dL (ref 8–23)
CO2: 26 mmol/L (ref 22–32)
Calcium: 8.5 mg/dL — ABNORMAL LOW (ref 8.9–10.3)
Chloride: 96 mmol/L — ABNORMAL LOW (ref 98–111)
Creatinine, Ser: 0.56 mg/dL (ref 0.44–1.00)
GFR, Estimated: >60 mL/min (ref >60)
Glucose, Bld: 85 mg/dL (ref 70–99)
Potassium: 3.4 mmol/L — ABNORMAL LOW (ref 3.5–5.1)
Sodium: 135 mmol/L (ref 135–145)
Total Bilirubin: 1.1 mg/dL (ref 0.3–1.2)
Total Protein: 6.2 g/dL — ABNORMAL LOW (ref 6.5–8.1)

## 2021-02-26 LAB — CBC
HCT: 33.1 % — ABNORMAL LOW (ref 36.0–46.0)
Hemoglobin: 11 g/dL — ABNORMAL LOW (ref 12.0–15.0)
MCH: 30.3 pg (ref 26.0–34.0)
MCHC: 33.2 g/dL (ref 30.0–36.0)
MCV: 91.2 fL (ref 80.0–100.0)
Platelets: 266 10*3/uL (ref 150–400)
RBC: 3.63 MIL/uL — ABNORMAL LOW (ref 3.87–5.11)
RDW: 16.2 % — ABNORMAL HIGH (ref 11.5–15.5)
WBC: 3.5 10*3/uL — ABNORMAL LOW (ref 4.0–10.5)
nRBC: 0 % (ref 0.0–0.2)

## 2021-02-26 LAB — MAGNESIUM: Magnesium: 2 mg/dL (ref 1.7–2.4)

## 2021-02-26 MED ORDER — IPRATROPIUM-ALBUTEROL 0.5-2.5 (3) MG/3ML IN SOLN: 3.0000 mL | RESPIRATORY_TRACT | Status: DC | PRN

## 2021-02-26 MED ORDER — POLYETHYLENE GLYCOL 3350 17 G PO PACK
17.0000 g | PACK | Freq: Two times a day (BID) | ORAL | Status: DC
  Administered 2021-02-27 – 2021-03-04 (×10): 17 g via ORAL
  Filled 2021-02-26 (×13): qty 1

## 2021-02-26 MED ORDER — BISACODYL 5 MG PO TBEC
5.0000 mg | DELAYED_RELEASE_TABLET | Freq: Every day | ORAL | Status: DC
Start: 2021-02-26 — End: 2021-03-08
  Administered 2021-02-27 – 2021-03-08 (×7): 5 mg via ORAL
  Filled 2021-02-26 (×11): qty 1

## 2021-02-26 MED ORDER — HYDROMORPHONE 1 MG/ML IV SOLN
INTRAVENOUS | Status: DC
  Administered 2021-02-26: 30 mg via INTRAVENOUS
  Administered 2021-02-27: 6.83 mg via INTRAVENOUS
  Administered 2021-02-27: 4.98 mg via INTRAVENOUS
  Administered 2021-02-27: 30 mg via INTRAVENOUS
  Administered 2021-02-28: 2.72 mg via INTRAVENOUS
  Administered 2021-02-28 (×2): 1 mg via INTRAVENOUS
  Filled 2021-02-26 (×3): qty 30

## 2021-02-26 MED ORDER — MORPHINE SULFATE ER 100 MG PO TBCR
100.0000 mg | EXTENDED_RELEASE_TABLET | Freq: Two times a day (BID) | ORAL | Status: DC
  Filled 2021-02-26: qty 1

## 2021-02-26 MED ORDER — PANTOPRAZOLE SODIUM 40 MG IV SOLR
40.0000 mg | INTRAVENOUS | Status: DC
  Administered 2021-02-26 – 2021-02-28 (×3): 40 mg via INTRAVENOUS
  Filled 2021-02-26 (×3): qty 40

## 2021-02-26 MED ORDER — SENNOSIDES-DOCUSATE SODIUM 8.6-50 MG PO TABS
1.0000 | ORAL_TABLET | Freq: Two times a day (BID) | ORAL | Status: DC
  Administered 2021-02-27 – 2021-03-04 (×12): 1 via ORAL
  Filled 2021-02-26 (×13): qty 1

## 2021-02-26 MED ORDER — MINERAL OIL RE ENEM: 1.0000 | ENEMA | Freq: Once | RECTAL | Status: DC

## 2021-02-26 MED ORDER — SODIUM CHLORIDE 0.9 % IV SOLN
12.5000 mg | Freq: Three times a day (TID) | INTRAVENOUS | Status: DC | PRN
  Administered 2021-02-27: 12.5 mg via INTRAVENOUS
  Filled 2021-02-26 (×2): qty 0.5

## 2021-02-26 MED ORDER — POTASSIUM CHLORIDE CRYS ER 20 MEQ PO TBCR: 40.0000 meq | EXTENDED_RELEASE_TABLET | Freq: Once | ORAL | Status: DC

## 2021-02-26 MED ORDER — OXYCODONE HCL 5 MG PO TABS
20.0000 mg | ORAL_TABLET | ORAL | Status: DC | PRN
  Administered 2021-02-28 (×2): 20 mg via ORAL
  Filled 2021-02-26 (×2): qty 4

## 2021-02-26 MED ORDER — POTASSIUM CHLORIDE 10 MEQ/100ML IV SOLN
10.0000 meq | INTRAVENOUS | Status: AC
  Administered 2021-02-26 (×4): 10 meq via INTRAVENOUS
  Filled 2021-02-26 (×4): qty 100

## 2021-02-26 MED ORDER — LORAZEPAM 2 MG/ML IJ SOLN
0.5000 mg | Freq: Two times a day (BID) | INTRAMUSCULAR | Status: DC | PRN
  Administered 2021-02-27: 0.5 mg via INTRAVENOUS
  Filled 2021-02-26: qty 1

## 2021-02-26 MED ORDER — BISACODYL 10 MG RE SUPP: 10.0000 mg | Freq: Every day | RECTAL | Status: DC

## 2021-02-26 MED ORDER — PROCHLORPERAZINE EDISYLATE 10 MG/2ML IJ SOLN: 10.0000 mg | Freq: Four times a day (QID) | INTRAMUSCULAR | Status: DC | PRN

## 2021-02-26 NOTE — Progress Notes (Addendum)
HEMATOLOGY-ONCOLOGY PROGRESS NOTE  SUBJECTIVE: Her son is present at bedside. She thinks pain and nausea may be a "little better". She is nauseated at present. She last vomited yesterday. Pain is low back and abdomen. No urinary symptoms. Feels constipated. Takes Miralax and Dulcolax at home.   Oncology History  Pancreatic cancer ([place])  03/20/2020 Initial Diagnosis   Pancreatic cancer ([place])   03/28/2020 - 11/27/2020 Chemotherapy         03/31/2020 Genetic Testing   Negative genetic testing on the common hereditary cancer panel and pancreatitis cancer panel.  The custom Hereditary Gene Panel offered by [facility] includes sequencing and/or deletion duplication testing of the following 56 genes: APC*, ATM*, AXIN2, BARD1, BMPR1A, BRCA1, BRCA2, BRIP1, CASR, CDH1, CDK4, CDKN2A (p14ARF), CDKN2A (p16INK4a), CFTR*, CHEK2, CPA1, CTNNA1, CTRC, DICER1*, EPCAM*, FANCC, GREM1*, HOXB13, KIT, MEN1*, MLH1*, MSH2*, MSH3*, MSH6*, MUTYH, NBN, NF1*, NTHL1, PALB2, PALLD, PDGFRA, PMS2*, POLD1*, POLE, PRSS1*, PTEN*, RAD50, RAD51C, RAD51D, RNF43, SDHA*, SDHB, SDHC*, SDHD, SMAD4, SMARCA4, SPINK1, STK11, TP53, TSC1*, TSC2, and VHL The report date is 03/31/2020.   01/08/2021 -  Chemotherapy    Patient is on Treatment Plan: PANCREATIC ABRAXANE / GEMCITABINE D1,8,15 Q28D       PHYSICAL EXAMINATION:  Vitals:   02/26/21 0425 02/26/21 0731  BP:    Pulse:    Resp: 12 12  Temp:    SpO2: 98% 97%   Filed Weights   02/24/21 1115  Weight: 120 lb (54.4 kg)    Intake/Output from previous day: 03/29 0701 - 03/30 0700 In: -  Out: 750 [Urine:750]  GENERAL: Chronically ill-appearing.  No acute distress. SKIN: Erythematous rash noted to back, legs, chest OROPHARYNX: No thrush or ulcers. LUNGS: Clear bilaterally HEART: regular rate & rhythm  ABDOMEN: Positive bowel sounds, soft, mild generalized tenderness. NEURO: alert & oriented x 3 with fluent speech, no focal motor/sensory deficits  LABORATORY DATA:  I have reviewed  the data as listed CMP Latest Ref Rng & Units 02/26/2021 02/25/2021 02/24/2021  Glucose 70 - 99 mg/dL 85 99 121(H)  BUN 8 - 23 mg/dL 13 6(L) 6(L)  Creatinine 0.44 - 1.00 mg/dL 0.56 0.50 0.31(L)  Sodium 135 - 145 mmol/L 135 133(L) 137  Potassium 3.5 - 5.1 mmol/L 3.4(L) 2.6(LL) 2.5(LL)  Chloride 98 - 111 mmol/L 96(L) 95(L) 99  CO2 22 - 32 mmol/L '26 28 27  ' Calcium 8.9 - 10.3 mg/dL 8.5(L) 8.0(L) 8.3(L)  Total Protein 6.5 - 8.1 g/dL 6.2(L) 5.8(L) 6.1(L)  Total Bilirubin 0.3 - 1.2 mg/dL 1.1 0.7 0.6  Alkaline Phos 38 - 126 U/L 97 93 102  AST 15 - 41 U/L '20 22 28  ' ALT 0 - 44 U/L '20 20 22    ' Lab Results  Component Value Date   WBC 3.5 (L) 02/26/2021   HGB 11.0 (L) 02/26/2021   HCT 33.1 (L) 02/26/2021   MCV 91.2 02/26/2021   PLT 266 02/26/2021   NEUTROABS 3.2 02/24/2021    CT Abdomen Pelvis Wo Contrast  Result Date: 02/24/2021 CLINICAL DATA:  Abdominal distension, back and abdominal pain, pancreatic cancer EXAM: CT ABDOMEN AND PELVIS WITHOUT CONTRAST TECHNIQUE: Multidetector CT imaging of the abdomen and pelvis was performed following the standard protocol without IV contrast. COMPARISON:  11/14/2020 FINDINGS: Lower chest: No acute abnormality. Hepatobiliary: No solid liver abnormality is seen. No gallstones or gallbladder wall thickening. Redemonstrated common bile duct stent with post stenting pneumobilia. Pancreas: Although ill-defined and difficult to clearly evaluate, particularly by noncontrast examination, pancreatic head mass appears significantly enlarged,  overall dimensions of the pancreatic head approximately 4.4 x 4.3 cm, previously 3.1 x 2.9 cm when measured similarly (series 2, image 25). Spleen: Normal in size without significant abnormality. Adrenals/Urinary Tract: Adrenal glands are unremarkable. Kidneys are normal, without renal calculi, solid lesion, or hydronephrosis. Bladder is unremarkable. Stomach/Bowel: Stomach is within normal limits. Appendix appears normal. No evidence of  bowel wall thickening, distention, or inflammatory changes. Vascular/Lymphatic: Aortic atherosclerosis. Although indistinct and difficult to precisely measure, enlarged, matted left periaortic lymph nodes appear increased in size compared to prior examination, measuring approximately 3.5 x 2.7 cm, previously 2.4 x 2.1 cm when measured similarly (series 2, image 23). Reproductive: No mass or other significant abnormality. Other: No abdominal wall hernia or abnormality. Anasarca. Small volume ascites. Musculoskeletal: No acute or significant osseous findings. IMPRESSION: 1. Although ill-defined and difficult to clearly evaluate, particularly by noncontrast examination, pancreatic head mass appears significantly enlarged compared to prior examination, consistent with worsened primary pancreatic adenocarcinoma. 2. Although indistinct and difficult to precisely measure, enlarged, matted left periaortic lymph nodes appear increased in size compared to prior examination, concerning for worsened nodal metastatic disease. 3. Redemonstrated common bile duct stent with post stenting pneumobilia. 4. Small volume ascites and anasarca. Aortic Atherosclerosis (ICD10-I70.0). Electronically Signed   By: Eddie Candle M.D.   On: 02/24/2021 13:46   DG Chest 2 View  Result Date: 01/31/2021 CLINICAL DATA:  Shortness of breath and a cough for 2 days. Pancreatic cancer. EXAM: CHEST - 2 VIEW COMPARISON:  11/14/2020 chest CT.  09/12/2020 plain film. FINDINGS: Hyperinflation. Midline trachea. Normal heart size. Atherosclerosis in the transverse aorta. No pleural effusion or pneumothorax. Diffuse peribronchial thickening. No lobar consolidation. Biliary stent. No free intraperitoneal air. IMPRESSION: 1. No acute cardiopulmonary disease. 2. Hyperinflation and interstitial thickening, most consistent with COPD/chronic bronchitis. Electronically Signed   By: Abigail Miyamoto M.D.   On: 01/31/2021 13:25   DG ABD ACUTE 2+V W 1V CHEST  Result  Date: 02/24/2021 CLINICAL DATA:  Worsening abdominal pain with nausea and vomiting. History of pancreatic cancer. EXAM: DG ABDOMEN ACUTE WITH 1 VIEW CHEST COMPARISON:  Chest radiographs 01/31/2021.  Abdominal CT 11/14/2020. FINDINGS: Frontal chest radiograph demonstrates mild patient rotation to the right. Right IJ Port-A-Cath appears unchanged at the level of the superior cavoatrial junction. The heart size and mediastinal contours are stable allowing for the rotation. The lungs are clear. There is no pleural effusion or pneumothorax. Metallic biliary stent appears unchanged in position. Associated expected pneumobilia appears less evident. The bowel gas pattern is normal. There is no free intraperitoneal air. There is stable mild degenerative changes in the spine. IMPRESSION: 1. No acute cardiopulmonary or abdominal process identified. 2. Stable position of the biliary stent. Pneumobilia not well visualized; if concern of biliary obstruction, consider further evaluation with follow-up CT. Electronically Signed   By: Richardean Sale M.D.   On: 02/24/2021 12:12    ASSESSMENT AND PLAN: 1. 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 MRI abdomen 02/01/2020-2.2 x 3.3 cm mass in the posterior pancreas head/uncinate process, mild intrahepatic/extrahepatic ductal dilatation, no evidence of vascular invasion, possible small lymph nodes in the porta hepatis-poorly visualized ? EUS 02/29/2020-20 x 23 mm mass in the pancreas head, upstream pancreatic duct dilatation, 1 abnormal peripancreatic node, uT3?uN1 ? 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 CTs 03/19/2020-2.9 x 2.1 x 3.1 cm pancreas head/uncinate mass, lesion associated with infrarenal abdominal aorta and superior mesenteric artery, prominent and borderline enlarged retroperitoneal  nodes including a left periaortic node, indeterminate 4 mm lung nodule ? Cycle 1 mFOLFIRINOX 1/61/09, complicated by  n/v and hospital admission 5/2/ - 5/3 ? [facility] at [facility] with Dr. Earnestine Mealing and Dr. Mariah Milling 04/02/20 ? PET scan 04/03/2020-hypermetabolic poorly marginated pancreatic head mass; 2 hypermetabolic left periaortic lymph nodes, hypermetabolic left supraclavicular lymph node ? Cycle 2 FOLFIRINOX 04/11/2020 ? Ultrasound-guided biopsy of a left supraclavicular mass on 04/18/2020-poorly differentiated adenocarcinoma, cytokeratin 7+, cytokeratin 20 and CDX2 positive in rare cells. ? Cycle 3 FOLFIRINOX 04/24/2020-oxaliplatin infusion lengthened to 3 hours, Udenyca was not given (ordered) ? Cycle 4 FOLFIRINOX 05/08/2020 ? Cycle 5 FOLFIRINOX 05/22/2020-atropine and Ativan premedication added secondary to cholinergic symptoms and nausea from irinotecan ? CTs 05/29/2020-decreased left supraclavicular lymph node, slight decrease in size of the pancreas head mass, decrease in retroperitoneal lymphadenopathy ? Cycle 6 FOLFIRINOX 06/05/2020 ? Cycle 7 FOLFIRINOX 06/26/2020 ? Cycle 8 FOLFIRINOX 07/11/2020 (oxaliplatin held secondary to neuropathy) ? Cycle 9 FOLFIRINOX 07/24/2020 (oxaliplatin held secondary to neuropathy) ? Cycle 10 FOLFIRINOX 08/07/2020 (oxaliplatin held secondary to neuropathy) ? CTs 08/19/2020-subtle 5 mm peripheral right liver lesion, pancreas mass unchanged and difficult to discretely delineate, unchanged left retroperitoneal node, no evidence of disease progression, onDr. Kenson Groh'sreview of the CTs there is no change in the previously enlarged left supraclavicular node ? Cycle 11 FOLFIRINOX 09/18/2020 (oxaliplatin held secondary to neuropathy) ? Cycle 12 FOLFIRINOX 10/08/2020 (oxaliplatin held secondary to neuropathy) ? Cycle 13 FOLFIRINOX 10/30/2020 (oxaliplatin held secondary to neuropathy) ? CTs 11/14/2020-no evidence of thoracic metastases, slight enlargement of the pancreas head mass, increase in size of periaortic lymph node ? Cycle 14 FOLFIRINOX 11/25/2020 (oxaliplatin held secondary to  neuropathy) ? Cycle 1 gemcitabine/Abraxane 01/08/2021 ? Cycle 2 gemcitabine/Abraxane 01/30/2021 ? Cycle 3 gemcitabine 02/14/2021, Abraxane held due to progressive neuropathy  2. Abdomen/back pain secondary to #1-progressive January 2022 3. Anorexia/weight loss secondary to #1 4. History of coronary artery disease,STEMI with the fib arrest 2011, status post RCA stent 5. Constipation, secondary to #1 and morphine 6. 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 Metal stent placed into the common bile duct 09/06/2020. 9.Hoarsenessstatus post vocal cord injections2/24/2022  Ms. Critzer notes mild improvement in pain and nausea.  Plan to resume home oral narcotic regimen today. She and son are interested in home palliative care referral. She is constipated. She will begin Miralax and Dulcolax today.  Recommendations: 1.  Continue Dilaudid PCA   2.  Resume home oral narcotic regimen 3.  Consider home palliative care referral 4.  Scheduled Zofran and dexamethasone 4 mg twice daily.  Continue as needed Phenergan. 5.  Begin bowel regimen with Miralax and dulcolax    LOS: 2 days   [name],  AGPCNP-BC 02/26/21 Ms. Runyan was interviewed and examined.  The pain and nausea are partially improved today.  We will resume the outpatient oral narcotic regimen.  I suspect her symptoms are related to the locally advanced pancreas tumor.  We discussed management of her pain.  We will consider changing to a home infusion pump if her pain is not controlled or she cannot tolerate the oral regimen.  We will refer her to home palliative care.  I will recommend hospice care if her performance status  has not improved over the next week.  We discussed her current status and prognosis with her son.  I was present for greater than 50% of today's visit.  I perform medical decision making.

## 2021-02-26 NOTE — Consult Note (Signed)
Consultation Note Date: 02/26/2021   Patient Name: Vicki [name]  DOB: 1956/03/30  MRN: [id number]  Age / Sex: 65 y.o., female  PCP: Midge Minium, MD Referring Physician: Eugenie Filler, MD  Reason for Consultation: Non pain symptom management and Pain control  HPI/Patient Profile: 65 y.o. female  admitted on 02/24/2021    Clinical Assessment and Goals of Care: 65 year old lady who follows with Dr. Benay Spice from medical oncology, life limiting illness of pancreatic cancer, recently received systemic chemotherapy, admitted with abdominal pain nausea vomiting, started on Dilaudid PCA, palliative consult for symptom management has been requested.  Chart reviewed, medical history noted, imaging reviewed, recent CT scan abdomen noted.  Patient is awake alert sitting up in bed.  She has nausea vomiting going on.  She complains of lower back discomfort as well as bilateral lower quadrant abdominal discomfort.  Family present at bedside.  Introduced myself and palliative care as follows:  Palliative medicine is specialized medical care for people living with serious illness. It focuses on providing relief from the symptoms and stress of a serious illness. The goal is to improve quality of life for both the patient and the family.  Discussed with the patient about her home regimen.  Patient was on MS Contin as well as p.o. as needed short acting oxycodone at home.  She was also on bowel regimen.  She has not had a bowel movement in several days.  She has really not had much to eat since the past 3-4 days now.  Patient appears visibly distressed, in symptom crisis.  Discussed with bedside nurse.  Loading dose of 0.5 mg of IV Dilaudid given off of her PCA.  Will increase both basal and bolus rates and monitor.  Medication history also noted.  Continue scheduled Zofran.  Patient has borderline elevated QT interval,  electrolytes are being replaced.  Consider low-dose Zyprexa for additional management of nausea vomiting and once electrolytes are repleted, Zyprexa typically has side effect of mild QT prolongation.  Augment bowel regimen, discussed with hospital medicine physician, enema to be given today.  Also on steroids, also on as needed benzodiazepines.  Medication history reviewed.   NEXT OF KIN Family present at the bedside  SUMMARY OF RECOMMENDATIONS   Increase both basal and bolus dosages on Dilaudid PCA, hold p.o. MS Contin as well as p.o. OxyIR as patient has intractable nausea vomiting this morning. Continue current antiemetic regimen, consider Zyprexa once electrolytes are repleted and once QT prolongation is improved. Bowel regimen: Enema today, monitor for results, continue other bowel regimen as ordered. Continue efforts at symptom management: Pain, nausea, vomiting, constipation, generalized distress. Thank you for the consult.  PMT to follow.  Code Status/Advance Care Planning:  DNR    Symptom Management:   See above  Palliative Prophylaxis:   Bowel Regimen  Additional Recommendations (Limitations, Scope, Preferences):  Full Scope Treatment  Psycho-social/Spiritual:   Desire for further Chaplaincy support:yes  Additional Recommendations: Caregiving  Support/Resources  Prognosis:   Unable to determine  Discharge  Planning: To Be Determined      Primary Diagnoses: Present on Admission: . Cancer related pain . Coronary artery disease involving native coronary artery of native heart without angina pectoris . Essential hypertension . Hyperlipidemia . Hypokalemia due to excessive gastrointestinal loss of potassium . Pancreatic cancer ([place])   I have reviewed the medical record, interviewed the patient and family, and examined the patient. The following aspects are pertinent.  Past Medical History:  Diagnosis Date  . CAD (coronary artery disease) 2011   with  stent to RCA and 60 % LAD disease  . Clotting disorder ([place])   . Dyspnea   . Family history of breast cancer   . Family history of cystic fibrosis   . GERD (gastroesophageal reflux disease)   . H/O cardiac arrest 02/15/10   with STEMI- Inf wall  . Hyperlipidemia   . Hypertension   . Myocardial infarct ([place]) 02/15/10  . pancreatic ca dx'd 02/2020  . Pneumonia   . Shock, cardiogenic ([place]) 01/2010   with MI, IABP   Social History   Socioeconomic History  . Marital status: Married    Spouse name: Not on file  . Number of children: 2  . Years of education: Not on file  . Highest education level: Not on file  Occupational History  . 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 Smoking status: Former Smoker    Types: Cigarettes    Quit date: 01/28/2010    Years since quitting: 11.0  . Smokeless tobacco: Never Used  Vaping Use  . Vaping Use: Never used  Substance and Sexual Activity  . Alcohol use: Not Currently    Alcohol/week: 1.0 - 2.0 standard drink    Types: 1 - 2 Glasses of wine per week  . Drug use: No  . Sexual activity: Not on file    Comment: Hysterectomy  Other Topics Concern  . Not on file  Social History Narrative  . Not on file   Social Determinants of Health   Financial Resource Strain: Not on file  Food Insecurity: Not on file  Transportation Needs: Not on file  Physical Activity: Not on file  Stress: Not on file  Social Connections: Not on file   Family History  Problem Relation Age of Onset  . Breast cancer Mother 54  . Dementia Mother   . Cystic fibrosis Father 77       late onset dx  . Healthy Brother   . Dementia Maternal Aunt   . Dementia Maternal Uncle   . Other Paternal Uncle        MVA while in the [employer]  . Arthritis Maternal Grandmother   . AAA (abdominal aortic aneurysm) Maternal Grandfather   . Other Paternal 85        old age  . Heart attack Paternal Grandfather   . Liver disease Paternal Uncle   .  Pancreatitis Son        several bouts of pancreatitis  . Breast cancer Cousin 84       mat first cousin  . Colon cancer Neg Hx    Scheduled Meds: . bisacodyl  5 mg Oral Daily  . dexamethasone (DECADRON) injection  4 mg Intravenous Q12H  . enoxaparin (LOVENOX) injection  40 mg Subcutaneous Q24H  . HYDROmorphone   Intravenous Q4H  . mineral oil  1 enema Rectal Once  . morphine  100 mg Oral Q12H  . ondansetron (ZOFRAN) IV  4 mg Intravenous  Q6H  . pantoprazole (PROTONIX) IV  40 mg Intravenous Q24H   Continuous Infusions: . potassium chloride 10 mEq (02/26/21 1008)  . promethazine (PHENERGAN) injection 25 mg (02/26/21 0851)   PRN Meds:.albuterol, diphenhydrAMINE **OR** diphenhydrAMINE, LORazepam, naloxone **AND** sodium chloride flush, oxyCODONE, polyethylene glycol, prochlorperazine, promethazine (PHENERGAN) injection, sodium chloride flush Medications Prior to Admission:  Prior to Admission medications   Medication Sig Start Date End Date Taking? Authorizing Provider  albuterol (PROVENTIL) (2.5 MG/3ML) 0.083% nebulizer solution Take 2.5 mg by nebulization every 6 (six) hours as needed for wheezing or shortness of breath.   Yes [provider]  Albuterol Sulfate (PROAIR RESPICLICK) 517 (90 Base) MCG/ACT AEPB Inhale 1-2 puffs into the lungs every 6 (six) hours as needed (wheezing/shortness of breath).   Yes [provider]  ALPRAZolam (XANAX) 0.25 MG tablet TAKE 1 TABLET(0.25 MG) BY MOUTH AT BEDTIME Patient taking differently: Take 0.25 mg by mouth at bedtime. 09/20/20  Yes Midge Minium, MD  aspirin EC 81 MG tablet Take 81 mg by mouth at bedtime. Swallow whole.   Yes [provider]  cetirizine (ZYRTEC) 10 MG tablet Take 10 mg by mouth daily.   Yes [provider]  clopidogrel (PLAVIX) 75 MG tablet TAKE 1 TABLET(75 MG) BY MOUTH DAILY Patient taking differently: Take 75 mg by mouth daily. 01/06/21  Yes Lorretta Harp, MD  escitalopram (LEXAPRO) 10  MG tablet TAKE 1 TABLET(10 MG) BY MOUTH DAILY Patient taking differently: Take 5 mg by mouth in the morning and at bedtime. 07/08/20  Yes Midge Minium, MD  gabapentin (NEURONTIN) 100 MG capsule TAKE 2 CAPSULES(200 MG) BY MOUTH AT BEDTIME Patient taking differently: Take 200 mg by mouth 2 (two) times daily. 11/15/20  Yes Ladell Pier, MD  magic mouthwash SOLN Take 5-10 mLs by mouth 4 (four) times daily as needed for mouth pain. 02/05/21  Yes [provider]  morphine (MS CONTIN) 100 MG 12 hr tablet Take 1 tablet (100 mg total) by mouth every 8 (eight) hours. 02/14/21  Yes Owens Shark, NP  Omega-3 Fatty Acids (FISH OIL) 1000 MG CAPS Take 1,000 mg by mouth at bedtime.   Yes [provider]  ondansetron (ZOFRAN) 8 MG tablet Take 1 tablet (8 mg total) by mouth every 8 (eight) hours as needed for nausea or vomiting. 02/14/21  Yes Owens Shark, NP  Oxycodone HCl 20 MG TABS Take 1.5 tablets (30 mg total) by mouth every 4 (four) hours as needed. Patient taking differently: Take 30 mg by mouth every 4 (four) hours as needed (pain). 02/10/21  Yes Owens Shark, NP  pantoprazole (PROTONIX) 40 MG tablet TAKE 1 TABLET(40 MG) BY MOUTH TWICE DAILY Patient taking differently: Take 40 mg by mouth 2 (two) times daily. 12/02/20  Yes Lorretta Harp, MD  polyethylene glycol (MIRALAX / GLYCOLAX) 17 g packet Take 17 g by mouth daily as needed for mild constipation or moderate constipation.    Yes [provider]  potassium chloride SA (KLOR-CON) 20 MEQ tablet TAKE 1 TABLET(20 MEQ) BY MOUTH DAILY Patient taking differently: Take 20 mEq by mouth daily. 03/15/20  Yes Lorretta Harp, MD  rosuvastatin (CRESTOR) 10 MG tablet TAKE 1 TABLET(10 MG) BY MOUTH DAILY Patient taking differently: Take 10 mg by mouth daily. 01/06/21  Yes Lorretta Harp, MD  methylPREDNISolone (MEDROL DOSEPAK) 4 MG TBPK tablet Take as directed Patient not taking: No sig reported 01/31/21   Owens Shark, NP  Allergies  Allergen Reactions  . Chlorhexidine     Do not add, port not accessed  . Contrast Media [Iodinated Diagnostic Agents] Hives    CT IV contrast- pt states she still has itching even with 4 hour and 13 hour preps 09/06/20  . Oxaliplatin     If pt receives Oxaliplatin please inf over 3 hrs.   Review of Systems Positive for nausea Positive for vomiting Positive for low back pain Positive for bilateral lower quadrant abdominal pain.  Physical Exam To sitting up in bed, appears restless and uncomfortable because of intractable nausea vomiting as well as ongoing pain Regular work of breathing Has rash Has some muscle wasting Moist oral mucosa Normal breathing effort No focal deficits noted.  Vital Signs: BP (!) 150/89 (BP Location: Right Arm)   Pulse 83   Temp 97.9 F (36.6 C) (Oral)   Resp 12   Ht 5\' 6"  (1.676 m)   Wt 54.4 kg   LMP  (LMP Unknown)   SpO2 97%   BMI 19.37 kg/m  Pain Scale: 0-10 POSS *See Group Information*: S-Acceptable,Sleep, easy to arouse Pain Score: Asleep   SpO2: SpO2: 97 % O2 Device:SpO2: 97 % O2 Flow Rate: .O2 Flow Rate (L/min): 2 L/min  IO: Intake/output summary:   Intake/Output Summary (Last 24 hours) at 02/26/2021 1032 Last data filed at 02/26/2021 0600 Gross per 24 hour  Intake --  Output 750 ml  Net -750 ml    LBM: Last BM Date: 02/24/21 Baseline Weight: Weight: 54.4 kg Most recent weight: Weight: 54.4 kg     Palliative Assessment/Data:   Palliative performance scale 50%.  Time In: 9.30 Time Out:  10.30 Time Total:  60 Greater than 50%  of this time was spent counseling and coordinating care related to the above assessment and plan.  Signed by: Loistine Chance, MD   Please contact Palliative Medicine Team phone at [phone] for questions and concerns.  For individual provider: See Shea Evans

## 2021-02-26 NOTE — Progress Notes (Signed)
PROGRESS NOTE    Vicki [name]  NID:[id number] DOB: September 24, 1956 DOA: 02/24/2021 PCP: Midge Minium, MD    Chief Complaint  Patient presents with  . Abdominal Pain    Brief Narrative 65 year old female with past medical history of pancreatic cancer with biliary stent on gemcitabine/Abraxane(last received gemcitabine on 3/18 and plan for chemo on 3/31), COPD, chronic cancer-related painon chronic opiates, hypertension, hyperlipidemia, MI and cardiogenic shock, collagenous colitis who presented to the ED with emesis and abdominal pain unrelieved by home analgesia. Patient placed on a Dilaudid PCA pump.  Palliative care consulted to help with pain management.  Oncology following.   Assessment & Plan:   Principal Problem:   Cancer related pain Active Problems:   Hyperlipidemia   Essential hypertension   Coronary artery disease involving native coronary artery of native heart without angina pectoris   Pancreatic cancer (HCC)   Hypokalemia due to excessive gastrointestinal loss of potassium   Hypomagnesemia   Constipation   Chronic obstructive pulmonary disease (HCC)  1 acute on chronic pancreatic cancer related abdominal pain with nausea and vomiting -CT abdomen and pelvis done with concern for progression of pancreatic cancer and nodal metastatic disease with small volume ascites and anasarca. -Patient noted at home to be on MS Contin 100 mg every 8 hours, oxycodone 30 mg every 4 hours as needed which was taking regularly with no significant relief. -Currently on IV Dilaudid PCA. -Palliative care consulted and are following and patient given 0.5 mg IV Dilaudid dose of PCA this morning and Dilaudid PCA being adjusted per palliative care. -Continue scheduled IV Zofran. -Continue IV Decadron. -QT prolongation improving. -Supportive care.  2.  Constipation Patient with complaints of constipation. -Patient on chronic pain medications. -Mineral oil enema x1. -Continue oral  Dulcolax daily. -Place on MiraLAX twice daily, Senokot-S twice daily.  3.  Hypokalemia -Secondary to GI losses from nausea and emesis.   -Replete.  4.  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 -Continue current pain management. -Patient being followed by oncology who recommended hospice care if patient's performance status has not improved over the next week. -Palliative care following. -Per oncology.  5.  Hypomagnesemia Repleted.  6.  Rash of unclear etiology -Asymptomatic -Supportive care.  7.  COPD -Discontinue albuterol as needed and placed on DuoNebs as needed. -Not in acute exacerbation.  8.  Hypertension - likely component of pain. -Not on antihypertensive medications on home -Hydralazine as needed.  9.  Hyperlipidemia/CAD/history of STEMI -Currently stable. -Aspirin/statin on hold due to nausea and emesis and not reliably tolerating oral intake. -Supportive care.   DVT prophylaxis: Lovenox Code Status: DNR Family Communication:  Disposition:   Status is: Inpatient    Dispo: The patient is from: Home              Anticipated d/c is to: Likely home.              Patient currently on Dilaudid PCA pump, pain management underway, not stable for discharge.   Difficult to place patient no       Consultants:   Palliative care: Dr. Rowe Pavy 02/26/2021  Oncology: Dr. Benay Spice 02/25/2021  Procedures:  CT abdomen and pelvis 02/24/2021  Acute abdominal series 02/24/2021    Antimicrobials:   None   Subjective: Patient sleeping but arousable.  Daughter at bedside.  Patient endorses nausea.  No emesis today.  Still with lower abdominal  pain.  No chest pain.  Denies any bleeding.  Has not had a bowel movement in 3 to 4 days.  Feeling constipated. On Dilaudid PCA pump.  Objective: Vitals:   02/26/21 1042 02/26/21 1223 02/26/21 1416 02/26/21  1540  BP: (!) 145/92  (!) 147/72   Pulse: 85  69   Resp: 15 14 14 14   Temp: 98.7 F (37.1 C)  (!) 97.2 F (36.2 C)   TempSrc: Axillary  Axillary   SpO2: 100% 100% 96%   Weight:      Height:        Intake/Output Summary (Last 24 hours) at 02/26/2021 1636 Last data filed at 02/26/2021 1330 Gross per 24 hour  Intake --  Output 850 ml  Net -850 ml   Filed Weights   02/24/21 1115  Weight: 54.4 kg    Examination:  General exam: Appears calm and comfortable  Respiratory system: Clear to auscultation. Respiratory effort normal. Cardiovascular system: S1 & S2 heard, RRR. No JVD, murmurs, rubs, gallops or clicks. No pedal edema. Gastrointestinal system: Abdomen is nondistended, soft and tender to palpation in the lower abdomen.  Positive bowel sounds.  No rebound.  No guarding. Central nervous system: Alert and oriented. No focal neurological deficits. Extremities: Symmetric 5 x 5 power. Skin: No rashes, lesions or ulcers Psychiatry: Judgement and insight appear normal. Mood & affect appropriate.     Data Reviewed: I have personally reviewed following labs and imaging studies  CBC: Recent Labs  Lab 02/24/21 1139 02/25/21 1032 02/26/21 0526  WBC 4.0 5.5 3.5*  NEUTROABS 3.2  --   --   HGB 10.3* 11.1* 11.0*  HCT 30.9* 32.3* 33.1*  MCV 91.4 89.2 91.2  PLT 226 250 329    Basic Metabolic Panel: Recent Labs  Lab 02/24/21 1139 02/25/21 1032 02/26/21 0526  NA 137 133* 135  K 2.5* 2.6* 3.4*  CL 99 95* 96*  CO2 27 28 26   GLUCOSE 121* 99 85  BUN 6* 6* 13  CREATININE 0.31* 0.50 0.56  CALCIUM 8.3* 8.0* 8.5*  MG 1.6* 2.0 2.0    GFR: Estimated Creatinine Clearance: 59.4 mL/min (by C-G formula based on SCr of 0.56 mg/dL).  Liver Function Tests: Recent Labs  Lab 02/24/21 1139 02/25/21 1032 02/26/21 0526  AST 28 22 20   ALT 22 20 20   ALKPHOS 102 93 97  BILITOT 0.6 0.7 1.1  PROT 6.1* 5.8* 6.2*  ALBUMIN 3.4* 3.1* 3.4*    CBG: No results for input(s): GLUCAP in  the last 168 hours.   Recent Results (from the past 240 hour(s))  SARS CORONAVIRUS 2 (TAT 6-24 HRS) Nasopharyngeal Nasopharyngeal Swab     Status: None   Collection Time: 02/24/21  3:04 PM   Specimen: Nasopharyngeal Swab  Result Value Ref Range Status   SARS Coronavirus 2 NEGATIVE NEGATIVE Final    Comment: (NOTE) SARS-CoV-2 target nucleic acids are NOT DETECTED.  The SARS-CoV-2 RNA is generally detectable in upper and lower respiratory specimens during the acute phase of infection. Negative results do not preclude SARS-CoV-2 infection, do not rule out co-infections with other pathogens, and should not be used as the sole basis for treatment or other patient management decisions. Negative results must be combined with clinical observations, patient history, and epidemiological information. The expected result is Negative.  Fact Sheet for Patients: SugarRoll.be  Fact Sheet for Healthcare Providers: https://www.woods-mathews.com/  This test is not yet approved or cleared by the [place] FDA and  has been authorized for  detection and/or diagnosis of SARS-CoV-2 by FDA under an Emergency Use Authorization (EUA). This EUA will remain  in effect (meaning this test can be used) for the duration of the COVID-19 declaration under Se ction 564(b)(1) of the Act, 21 U.S.C. section 360bbb-3(b)(1), unless the authorization is terminated or revoked sooner.  Performed at [facility], [place] [address], [place], [place] [address]          Radiology Studies: No results found.      Scheduled Meds: . bisacodyl  5 mg Oral Daily  . dexamethasone (DECADRON) injection  4 mg Intravenous Q12H  . enoxaparin (LOVENOX) injection  40 mg Subcutaneous Q24H  . HYDROmorphone   Intravenous Q4H  . mineral oil  1 enema Rectal Once  . ondansetron (ZOFRAN) IV  4 mg Intravenous Q6H  . pantoprazole (PROTONIX) IV  40 mg Intravenous Q24H   Continuous  Infusions: . promethazine (PHENERGAN) injection       LOS: 2 days    Time spent: 40 minutes    Irine Seal, MD [facility]   To contact the attending provider between 7A-7P or the covering provider during after hours 7P-7A, please log into the web site [email or web address] and access using universal [place] password for that web site. If you do not have the password, please call the hospital operator.  02/26/2021, 4:36 PM

## 2021-02-27 ENCOUNTER — Inpatient Hospital Stay: Payer: Medicare Other

## 2021-02-27 ENCOUNTER — Inpatient Hospital Stay: Payer: Medicare Other | Admitting: Oncology

## 2021-02-27 DIAGNOSIS — Z515 Encounter for palliative care: Secondary | ICD-10-CM | POA: Diagnosis not present

## 2021-02-27 DIAGNOSIS — Z7189 Other specified counseling: Secondary | ICD-10-CM

## 2021-02-27 DIAGNOSIS — G893 Neoplasm related pain (acute) (chronic): Secondary | ICD-10-CM | POA: Diagnosis not present

## 2021-02-27 DIAGNOSIS — E876 Hypokalemia: Secondary | ICD-10-CM | POA: Diagnosis not present

## 2021-02-27 DIAGNOSIS — R109 Unspecified abdominal pain: Secondary | ICD-10-CM | POA: Diagnosis not present

## 2021-02-27 DIAGNOSIS — I2510 Atherosclerotic heart disease of native coronary artery without angina pectoris: Secondary | ICD-10-CM | POA: Diagnosis not present

## 2021-02-27 LAB — COMPREHENSIVE METABOLIC PANEL
ALT: 19 U/L (ref 0–44)
AST: 17 U/L (ref 15–41)
Albumin: 3.2 g/dL — ABNORMAL LOW (ref 3.5–5.0)
Alkaline Phosphatase: 79 U/L (ref 38–126)
Anion gap: 13 (ref 5–15)
BUN: 17 mg/dL (ref 8–23)
CO2: 24 mmol/L (ref 22–32)
Calcium: 8.4 mg/dL — ABNORMAL LOW (ref 8.9–10.3)
Chloride: 98 mmol/L (ref 98–111)
Creatinine, Ser: 0.6 mg/dL (ref 0.44–1.00)
GFR, Estimated: >60 mL/min (ref >60)
Glucose, Bld: 81 mg/dL (ref 70–99)
Potassium: 3.7 mmol/L (ref 3.5–5.1)
Sodium: 135 mmol/L (ref 135–145)
Total Bilirubin: 1.1 mg/dL (ref 0.3–1.2)
Total Protein: 6 g/dL — ABNORMAL LOW (ref 6.5–8.1)

## 2021-02-27 LAB — CBC
HCT: 33.6 % — ABNORMAL LOW (ref 36.0–46.0)
Hemoglobin: 11.3 g/dL — ABNORMAL LOW (ref 12.0–15.0)
MCH: 30.7 pg (ref 26.0–34.0)
MCHC: 33.6 g/dL (ref 30.0–36.0)
MCV: 91.3 fL (ref 80.0–100.0)
Platelets: 288 10*3/uL (ref 150–400)
RBC: 3.68 MIL/uL — ABNORMAL LOW (ref 3.87–5.11)
RDW: 16.1 % — ABNORMAL HIGH (ref 11.5–15.5)
WBC: 5.4 10*3/uL (ref 4.0–10.5)
nRBC: 0 % (ref 0.0–0.2)

## 2021-02-27 MED ORDER — DEXAMETHASONE 4 MG PO TABS
4.0000 mg | ORAL_TABLET | Freq: Every day | ORAL | Status: DC
  Administered 2021-02-27 – 2021-03-03 (×5): 4 mg via ORAL
  Filled 2021-02-27 (×5): qty 1

## 2021-02-27 MED ORDER — POTASSIUM CHLORIDE 10 MEQ/100ML IV SOLN
10.0000 meq | INTRAVENOUS | Status: AC
  Administered 2021-02-27 (×4): 10 meq via INTRAVENOUS
  Filled 2021-02-27 (×4): qty 100

## 2021-02-27 MED ORDER — MINERAL OIL RE ENEM
1.0000 | ENEMA | Freq: Every day | RECTAL | Status: DC | PRN
  Administered 2021-02-27: 1 via RECTAL
  Filled 2021-02-27 (×3): qty 1

## 2021-02-27 MED ORDER — ONDANSETRON HCL 4 MG/2ML IJ SOLN: 4.0000 mg | Freq: Four times a day (QID) | INTRAMUSCULAR | Status: DC | PRN

## 2021-02-27 MED ORDER — MORPHINE SULFATE ER 100 MG PO TBCR
100.0000 mg | EXTENDED_RELEASE_TABLET | Freq: Two times a day (BID) | ORAL | Status: DC
Start: 2021-02-27 — End: 2021-03-02
  Administered 2021-02-27 – 2021-03-01 (×6): 100 mg via ORAL
  Filled 2021-02-27 (×6): qty 1

## 2021-02-27 NOTE — Care Management Important Message (Signed)
Important Message  Patient Details IM Letter given to the Patient. Name: Vicki [name] MRN: [id number] Date of Birth: [birth year]   Medicare Important Message Given:  Yes     Kerin Salen 02/27/2021, 10:53 AM

## 2021-02-27 NOTE — Progress Notes (Addendum)
HEMATOLOGY-ONCOLOGY PROGRESS NOTE  SUBJECTIVE: The patient is feeling better today.  She is getting up to go to the bathroom at time of visit.  Pain is much better controlled at this time.  She is using Dilaudid PCA.  MS Contin that was ordered yesterday morning was discontinued due to concern for nausea vomiting.  She also has an order for oxycodone as needed which she is not using.  Bowels have moved.  Nausea and vomiting much better.  Oncology History  Pancreatic cancer ([place])  03/20/2020 Initial Diagnosis   Pancreatic cancer ([place])   03/28/2020 - 11/27/2020 Chemotherapy         03/31/2020 Genetic Testing   Negative genetic testing on the common hereditary cancer panel and pancreatitis cancer panel.  The custom Hereditary Gene Panel offered by [facility] includes sequencing and/or deletion duplication testing of the following 56 genes: APC*, ATM*, AXIN2, BARD1, BMPR1A, BRCA1, BRCA2, BRIP1, CASR, CDH1, CDK4, CDKN2A (p14ARF), CDKN2A (p16INK4a), CFTR*, CHEK2, CPA1, CTNNA1, CTRC, DICER1*, EPCAM*, FANCC, GREM1*, HOXB13, KIT, MEN1*, MLH1*, MSH2*, MSH3*, MSH6*, MUTYH, NBN, NF1*, NTHL1, PALB2, PALLD, PDGFRA, PMS2*, POLD1*, POLE, PRSS1*, PTEN*, RAD50, RAD51C, RAD51D, RNF43, SDHA*, SDHB, SDHC*, SDHD, SMAD4, SMARCA4, SPINK1, STK11, TP53, TSC1*, TSC2, and VHL The report date is 03/31/2020.   01/08/2021 -  Chemotherapy    Patient is on Treatment Plan: PANCREATIC ABRAXANE / GEMCITABINE D1,8,15 Q28D       PHYSICAL EXAMINATION:  Vitals:   02/27/21 0535 02/27/21 0733  BP: 136/84   Pulse: 79   Resp: 14 16  Temp: 99.1 F (37.3 C)   SpO2: 96%    Filed Weights   02/24/21 1115  Weight: 54.4 kg    Intake/Output from previous day: 03/30 0701 - 03/31 0700 In: -  Out: 400 [Urine:400]  GENERAL: Chronically ill-appearing.  No acute distress. SKIN: Erythematous rash noted to back, legs, chest OROPHARYNX: No thrush or ulcers. LUNGS: Clear bilaterally HEART: regular rate & rhythm  ABDOMEN: Positive bowel  sounds, soft, mild generalized tenderness. NEURO: alert & oriented x 3 with fluent speech, no focal motor/sensory deficits  LABORATORY DATA:  I have reviewed the data as listed CMP Latest Ref Rng & Units 02/27/2021 02/26/2021 02/25/2021  Glucose 70 - 99 mg/dL 81 85 99  BUN 8 - 23 mg/dL 17 13 6(L)  Creatinine 0.44 - 1.00 mg/dL 0.60 0.56 0.50  Sodium 135 - 145 mmol/L 135 135 133(L)  Potassium 3.5 - 5.1 mmol/L 3.7 3.4(L) 2.6(LL)  Chloride 98 - 111 mmol/L 98 96(L) 95(L)  CO2 22 - 32 mmol/L '24 26 28  ' Calcium 8.9 - 10.3 mg/dL 8.4(L) 8.5(L) 8.0(L)  Total Protein 6.5 - 8.1 g/dL 6.0(L) 6.2(L) 5.8(L)  Total Bilirubin 0.3 - 1.2 mg/dL 1.1 1.1 0.7  Alkaline Phos 38 - 126 U/L 79 97 93  AST 15 - 41 U/L '17 20 22  ' ALT 0 - 44 U/L '19 20 20    ' Lab Results  Component Value Date   WBC 5.4 02/27/2021   HGB 11.3 (L) 02/27/2021   HCT 33.6 (L) 02/27/2021   MCV 91.3 02/27/2021   PLT 288 02/27/2021   NEUTROABS 3.2 02/24/2021    CT Abdomen Pelvis Wo Contrast  Result Date: 02/24/2021 CLINICAL DATA:  Abdominal distension, back and abdominal pain, pancreatic cancer EXAM: CT ABDOMEN AND PELVIS WITHOUT CONTRAST TECHNIQUE: Multidetector CT imaging of the abdomen and pelvis was performed following the standard protocol without IV contrast. COMPARISON:  11/14/2020 FINDINGS: Lower chest: No acute abnormality. Hepatobiliary: No solid liver abnormality is seen.  No gallstones or gallbladder wall thickening. Redemonstrated common bile duct stent with post stenting pneumobilia. Pancreas: Although ill-defined and difficult to clearly evaluate, particularly by noncontrast examination, pancreatic head mass appears significantly enlarged, overall dimensions of the pancreatic head approximately 4.4 x 4.3 cm, previously 3.1 x 2.9 cm when measured similarly (series 2, image 25). Spleen: Normal in size without significant abnormality. Adrenals/Urinary Tract: Adrenal glands are unremarkable. Kidneys are normal, without renal calculi,  solid lesion, or hydronephrosis. Bladder is unremarkable. Stomach/Bowel: Stomach is within normal limits. Appendix appears normal. No evidence of bowel wall thickening, distention, or inflammatory changes. Vascular/Lymphatic: Aortic atherosclerosis. Although indistinct and difficult to precisely measure, enlarged, matted left periaortic lymph nodes appear increased in size compared to prior examination, measuring approximately 3.5 x 2.7 cm, previously 2.4 x 2.1 cm when measured similarly (series 2, image 23). Reproductive: No mass or other significant abnormality. Other: No abdominal wall hernia or abnormality. Anasarca. Small volume ascites. Musculoskeletal: No acute or significant osseous findings. IMPRESSION: 1. Although ill-defined and difficult to clearly evaluate, particularly by noncontrast examination, pancreatic head mass appears significantly enlarged compared to prior examination, consistent with worsened primary pancreatic adenocarcinoma. 2. Although indistinct and difficult to precisely measure, enlarged, matted left periaortic lymph nodes appear increased in size compared to prior examination, concerning for worsened nodal metastatic disease. 3. Redemonstrated common bile duct stent with post stenting pneumobilia. 4. Small volume ascites and anasarca. Aortic Atherosclerosis (ICD10-I70.0). Electronically Signed   By: Eddie Candle M.D.   On: 02/24/2021 13:46   DG Chest 2 View  Result Date: 01/31/2021 CLINICAL DATA:  Shortness of breath and a cough for 2 days. Pancreatic cancer. EXAM: CHEST - 2 VIEW COMPARISON:  11/14/2020 chest CT.  09/12/2020 plain film. FINDINGS: Hyperinflation. Midline trachea. Normal heart size. Atherosclerosis in the transverse aorta. No pleural effusion or pneumothorax. Diffuse peribronchial thickening. No lobar consolidation. Biliary stent. No free intraperitoneal air. IMPRESSION: 1. No acute cardiopulmonary disease. 2. Hyperinflation and interstitial thickening, most  consistent with COPD/chronic bronchitis. Electronically Signed   By: Abigail Miyamoto M.D.   On: 01/31/2021 13:25   DG ABD ACUTE 2+V W 1V CHEST  Result Date: 02/24/2021 CLINICAL DATA:  Worsening abdominal pain with nausea and vomiting. History of pancreatic cancer. EXAM: DG ABDOMEN ACUTE WITH 1 VIEW CHEST COMPARISON:  Chest radiographs 01/31/2021.  Abdominal CT 11/14/2020. FINDINGS: Frontal chest radiograph demonstrates mild patient rotation to the right. Right IJ Port-A-Cath appears unchanged at the level of the superior cavoatrial junction. The heart size and mediastinal contours are stable allowing for the rotation. The lungs are clear. There is no pleural effusion or pneumothorax. Metallic biliary stent appears unchanged in position. Associated expected pneumobilia appears less evident. The bowel gas pattern is normal. There is no free intraperitoneal air. There is stable mild degenerative changes in the spine. IMPRESSION: 1. No acute cardiopulmonary or abdominal process identified. 2. Stable position of the biliary stent. Pneumobilia not well visualized; if concern of biliary obstruction, consider further evaluation with follow-up CT. Electronically Signed   By: Richardean Sale M.D.   On: 02/24/2021 12:12    ASSESSMENT AND PLAN: 1. 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 MRI abdomen 02/01/2020-2.2 x 3.3 cm mass in the posterior pancreas head/uncinate process, mild intrahepatic/extrahepatic ductal dilatation, no evidence of vascular invasion, possible small lymph nodes in the porta hepatis-poorly visualized ? EUS 02/29/2020-20 x 23 mm mass in the pancreas head, upstream pancreatic duct dilatation, 1 abnormal peripancreatic node, uT3?uN1 ? ERCP 03/05/2020-common  bile duct stricture, uncovered metal stent placed ? CTs 03/19/2020-2.9 x 2.1 x 3.1 cm pancreas head/uncinate mass, lesion associated with infrarenal abdominal aorta and superior mesenteric artery, prominent and  borderline enlarged retroperitoneal nodes including a left periaortic node, indeterminate 4 mm lung nodule ? Cycle 1 mFOLFIRINOX 06/30/64, complicated by n/v and hospital admission 5/2/ - 5/3 ? Second opinion at [facility] with Dr. Earnestine Mealing and Dr. Mariah Milling 04/02/20 ? PET scan 04/03/2020-hypermetabolic poorly marginated pancreatic head mass; 2 hypermetabolic left periaortic lymph nodes, hypermetabolic left supraclavicular lymph node ? Cycle 2 FOLFIRINOX 04/11/2020 ? Ultrasound-guided biopsy of a left supraclavicular mass on 04/18/2020-poorly differentiated adenocarcinoma, cytokeratin 7+, cytokeratin 20 and CDX2 positive in rare cells. ? Cycle 3 FOLFIRINOX 04/24/2020-oxaliplatin infusion lengthened to 3 hours, Udenyca was not given (ordered) ? Cycle 4 FOLFIRINOX 05/08/2020 ? Cycle 5 FOLFIRINOX 05/22/2020-atropine and Ativan premedication added secondary to cholinergic symptoms and nausea from irinotecan ? CTs 05/29/2020-decreased left supraclavicular lymph node, slight decrease in size of the pancreas head mass, decrease in retroperitoneal lymphadenopathy ? Cycle 6 FOLFIRINOX 06/05/2020 ? Cycle 7 FOLFIRINOX 06/26/2020 ? Cycle 8 FOLFIRINOX 07/11/2020 (oxaliplatin held secondary to neuropathy) ? Cycle 9 FOLFIRINOX 07/24/2020 (oxaliplatin held secondary to neuropathy) ? Cycle 10 FOLFIRINOX 08/07/2020 (oxaliplatin held secondary to neuropathy) ? CTs 08/19/2020-subtle 5 mm peripheral right liver lesion, pancreas mass unchanged and difficult to discretely delineate, unchanged left retroperitoneal node, no evidence of disease progression, onDr. Jordayn Mink'sreview of the CTs there is no change in the previously enlarged left supraclavicular node ? Cycle 11 FOLFIRINOX 09/18/2020 (oxaliplatin held secondary to neuropathy) ? Cycle 12 FOLFIRINOX 10/08/2020 (oxaliplatin held secondary to neuropathy) ? Cycle 13 FOLFIRINOX 10/30/2020 (oxaliplatin held secondary to neuropathy) ? CTs 11/14/2020-no evidence of thoracic metastases, slight  enlargement of the pancreas head mass, increase in size of periaortic lymph node ? Cycle 14 FOLFIRINOX 11/25/2020 (oxaliplatin held secondary to neuropathy) ? Cycle 1 gemcitabine/Abraxane 01/08/2021 ? Cycle 2 gemcitabine/Abraxane 01/30/2021 ? Cycle 3 gemcitabine 02/14/2021, Abraxane held due to progressive neuropathy  2. Abdomen/back pain secondary to #1-progressive January 2022 3. Anorexia/weight loss secondary to #1 4. History of coronary artery disease,STEMI with the fib arrest 2011, status post RCA stent 5. Constipation, secondary to #1 and morphine 6. 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 Metal stent placed into the common bile duct 09/06/2020. 9.Hoarsenessstatus post vocal cord injections2/24/2022  Ms. Fleek notes improvement in her pain and nausea.  Her constipation has resolved.  Plan is to resume her oral narcotic regimen today.  We will start with MS Contin 100 mg every 12 hours with plans to increase the frequency to every 8 hours.  I recommend that she try to use as needed oxycodone and try to limit use of Dilaudid PCA.  If she cannot tolerate oral regimen, she may need a home infusion pump.  For nausea, will change dexamethasone from 4 mg every 12 hours IV to 4 mg p.o. daily.  We will also change Zofran from scheduled every 6 hours to every 6 hours as needed.  Recommend to advance her diet as tolerated.  The patient is interested in home palliative care referral.  Recommendations: 1.  Restart MS Contin 100 mg every 12 hours today.  Will increase to every 8 hours tomorrow she is tolerating medication well. 2.  Continue Dilaudid PCA for now.  She has been  encouraged to use oxycodone as needed. 3.  We will make referral to home palliative care per her request. 4.  Dexamethasone has been changed to 4 mg p.o. daily and Zofran has been changed to every 6 hours as needed.  Continue as needed Phenergan. 5.  Continue bowel regimen. 6.  Advance diet as tolerated. 7.  Recommend for the patient to ambulate-PT consult placed.   LOS: 3 days   Mikey Bussing,  AGPCNP-BC 02/27/21 Ms. Vandevender was interviewed and examined.  The nausea has improved.  I recommend resuming oral narcotics to see if her pain can be controlled without the PCA.  If her pain is poorly controlled or she cannot tolerate an oral regimen we can arrange for a home Dilaudid infusion/PCA.  She stated that she does not wish to enter hospice care at present.  We will make a referral for home palliative care at discharge.  I was present for greater than 50% of today's visit.  I perform medical decision making.  Julieanne Manson, MD

## 2021-02-27 NOTE — Progress Notes (Signed)
PROGRESS NOTE    Vicki [name]  WJX:[id number] DOB: 1956-10-04 DOA: 02/24/2021 PCP: Midge Minium, MD    Chief Complaint  Patient presents with  . Abdominal Pain    Brief Narrative 65 year old female with past medical history of pancreatic cancer with biliary stent on gemcitabine/Abraxane(last received gemcitabine on 3/18 and plan for chemo on 3/31), COPD, chronic cancer-related painon chronic opiates, hypertension, hyperlipidemia, MI and cardiogenic shock, collagenous colitis who presented to the ED with emesis and abdominal pain unrelieved by home analgesia. Patient placed on a Dilaudid PCA pump.  Palliative care consulted to help with pain management.  Oncology following.   Assessment & Plan:   Principal Problem:   Cancer related pain Active Problems:   Hyperlipidemia   Essential hypertension   Coronary artery disease involving native coronary artery of native heart without angina pectoris   Pancreatic cancer (HCC)   Hypokalemia due to excessive gastrointestinal loss of potassium   Hypomagnesemia   Constipation   Chronic obstructive pulmonary disease (HCC)  1 acute on chronic pancreatic cancer related abdominal pain with nausea and vomiting -CT abdomen and pelvis done with concern for progression of pancreatic cancer and nodal metastatic disease with small volume ascites and anasarca. -Patient noted at home to be on MS Contin 100 mg every 8 hours, oxycodone 30 mg every 4 hours as needed which was taking regularly with no significant relief. -Currently on IV Dilaudid PCA. -Palliative care consulted and are following and patient given 0.5 mg IV Dilaudid dose of PCA and Dilaudid PCA being adjusted per palliative care. -IV scheduled Zofran has been changed to every 6 hours as needed.   -Continue Decadron.   -Patient has been restarted back on home regimen of MS Contin 100 mg every 12 hours per oncology will plan on increasing to every 8 hours if tolerated well. -Continue  Dilaudid PCA for now to assess breakthrough pain. -Start patient on clear liquid diet.  If tolerates will advance to full liquid diet for breakfast. -Supportive car5e  2.  Constipation Patient with complaints of constipation. -Patient on chronic pain medications. -Mineral oil enema x1 ordered yesterday however patient noted to have refused. -Patient stated had a small bowel movement yesterday. -Patient very hesitant for enema. -Continue twice daily MiraLAX, Senokot-S twice daily, daily oral Dulcolax..  3.  Hypokalemia -Secondary to GI losses.   -Repleted.  Potassium at 3.7.  Follow.  4.  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 -Continue current pain management. -Patient being followed by oncology who recommended hospice care if patient's performance status has not improved over the next week. -Palliative care following. -Per oncology.  5.  Hypomagnesemia Repleted.  6.  Rash of unclear etiology -Asymptomatic -Supportive care.  7.  COPD -Continue duo nebs as needed.    8.  Hypertension - likely component of pain. -Not on antihypertensive medications on home -Blood pressure improved.   9.  Hyperlipidemia/CAD/history of STEMI -Stable.   -Continue to hold aspirin and statin due to nausea and emesis and will resume once reliably tolerating oral intake.   -Supportive care.    DVT prophylaxis: Lovenox Code Status: DNR Family Communication: Updated patient and husband at bedside. Disposition:   Status is: Inpatient    Dispo: The patient is from: Home              Anticipated d/c is to: Likely home.  Patient currently on Dilaudid PCA pump, pain management underway, not stable for discharge.   Difficult to place patient no       Consultants:   Palliative care: Dr. Rowe Pavy 02/26/2021  Oncology: Dr. Benay Spice 02/25/2021  Procedures:  CT  abdomen and pelvis 02/24/2021  Acute abdominal series 02/24/2021    Antimicrobials:   None   Subjective: Patient sitting up in bed eating ice cream.  Husband at bedside.  Denies any further emesis.  Endorses some nausea.  Overall feeling better.  States abdominal pain is 9 out of 10 but states pain medication has been helping with pain.  Had small bowel movement yesterday.  Currently drinking MiraLAX.  On Dilaudid PCA pump.    Objective: Vitals:   02/27/21 0404 02/27/21 0535 02/27/21 0733 02/27/21 0900  BP:  136/84  (!) 143/83  Pulse:  79  66  Resp: 14 14 16    Temp:  99.1 F (37.3 C)  98.4 F (36.9 C)  TempSrc:  Oral  Oral  SpO2: 93% 96%  100%  Weight:      Height:        Intake/Output Summary (Last 24 hours) at 02/27/2021 1029 Last data filed at 02/26/2021 1330 Gross per 24 hour  Intake --  Output 400 ml  Net -400 ml   Filed Weights   02/24/21 1115  Weight: 54.4 kg    Examination:  General exam: : NAD Respiratory system: CTA B anterior lung fields.  No wheezes, no rhonchi.  Speaking in full sentences.  Normal respiratory effort. Cardiovascular system: Regular rate and rhythm no murmurs rubs or gallops.  No JVD.  No lower extremity edema.  Gastrointestinal system: Abdomen soft, nontender, nondistended, positive bowel sounds.  No rebound.  No guarding. Central nervous system: Alert and oriented. No focal neurological deficits. Extremities: Symmetric 5 x 5 power. Skin: No rashes, lesions or ulcers Psychiatry: Judgement and insight appear normal. Mood & affect appropriate.    Data Reviewed: I have personally reviewed following labs and imaging studies  CBC: Recent Labs  Lab 02/24/21 1139 02/25/21 1032 02/26/21 0526 02/27/21 0525  WBC 4.0 5.5 3.5* 5.4  NEUTROABS 3.2  --   --   --   HGB 10.3* 11.1* 11.0* 11.3*  HCT 30.9* 32.3* 33.1* 33.6*  MCV 91.4 89.2 91.2 91.3  PLT 226 250 266 102    Basic Metabolic Panel: Recent Labs  Lab 02/24/21 1139  02/25/21 1032 02/26/21 0526 02/27/21 0525  NA 137 133* 135 135  K 2.5* 2.6* 3.4* 3.7  CL 99 95* 96* 98  CO2 27 28 26 24   GLUCOSE 121* 99 85 81  BUN 6* 6* 13 17  CREATININE 0.31* 0.50 0.56 0.60  CALCIUM 8.3* 8.0* 8.5* 8.4*  MG 1.6* 2.0 2.0  --     GFR: Estimated Creatinine Clearance: 59.4 mL/min (by C-G formula based on SCr of 0.6 mg/dL).  Liver Function Tests: Recent Labs  Lab 02/24/21 1139 02/25/21 1032 02/26/21 0526 02/27/21 0525  AST 28 22 20 17   ALT 22 20 20 19   ALKPHOS 102 93 97 79  BILITOT 0.6 0.7 1.1 1.1  PROT 6.1* 5.8* 6.2* 6.0*  ALBUMIN 3.4* 3.1* 3.4* 3.2*    CBG: No results for input(s): GLUCAP in the last 168 hours.   Recent Results (from the past 240 hour(s))  SARS CORONAVIRUS 2 (TAT 6-24 HRS) Nasopharyngeal Nasopharyngeal Swab     Status: None   Collection Time: 02/24/21  3:04 PM   Specimen: Nasopharyngeal Swab  Result Value Ref Range Status   SARS Coronavirus 2 NEGATIVE NEGATIVE Final    Comment: (NOTE) SARS-CoV-2 target nucleic acids are NOT DETECTED.  The SARS-CoV-2 RNA is generally detectable in upper and lower respiratory specimens during the acute phase of infection. Negative results do not preclude SARS-CoV-2 infection, do not rule out co-infections with other pathogens, and should not be used as the sole basis for treatment or other patient management decisions. Negative results must be combined with clinical observations, patient history, and epidemiological information. The expected result is Negative.  Fact Sheet for Patients: SugarRoll.be  Fact Sheet for Healthcare Providers: https://www.woods-mathews.com/  This test is not yet approved or cleared by the [place] FDA and  has been authorized for detection and/or diagnosis of SARS-CoV-2 by FDA under an Emergency Use Authorization (EUA). This EUA will remain  in effect (meaning this test can be used) for the duration of the COVID-19  declaration under Se ction 564(b)(1) of the Act, 21 U.S.C. section 360bbb-3(b)(1), unless the authorization is terminated or revoked sooner.  Performed at [facility], [place] [address] [place], [place] [address]          Radiology Studies: No results found.      Scheduled Meds: . bisacodyl  5 mg Oral Daily  . dexamethasone  4 mg Oral Daily  . enoxaparin (LOVENOX) injection  40 mg Subcutaneous Q24H  . HYDROmorphone   Intravenous Q4H  . mineral oil  1 enema Rectal Once  . morphine  100 mg Oral Q12H  . pantoprazole (PROTONIX) IV  40 mg Intravenous Q24H  . polyethylene glycol  17 g Oral BID  . senna-docusate  1 tablet Oral BID   Continuous Infusions: . potassium chloride 10 mEq (02/27/21 1003)  . promethazine (PHENERGAN) injection 12.5 mg (02/27/21 0856)     LOS: 3 days    Time spent: 35 minutes    Irine Seal, MD [facility]   To contact the attending provider between 7A-7P or the covering provider during after hours 7P-7A, please log into the web site [email or web address] and access using universal [place] password for that web site. If you do not have the password, please call the hospital operator.  02/27/2021, 10:29 AM

## 2021-02-27 NOTE — Progress Notes (Signed)
Daily Progress Note   Patient Name: Vicki [name]       Date: 02/27/2021 DOB: May 06, 1956  Age: 65 y.o. MRN#: [id number] Attending Physician: Eugenie Filler, MD Primary Care Physician: Midge Minium, MD Admit Date: 02/24/2021  Reason for Consultation/Follow-up: Pain control   Subjective: Patient is resting in bed, husband at bedside.  Patient states that she is having a better day than yesterday.  She still has acute episodes of pain, states that the bolus option on the Dilaudid PCA is very helpful for when she has acute breakthrough pain.  Her p.o. long-acting opioids as well as p.o. as needed opioids have been resumed.  Discussed with patient about ongoing pain management options.  She did have a small bowel movement.  She states that the nausea is improved.  She is tolerating sips of liquids and even ice cream.  She complains of dry mouth.  Length of Stay: 3  Current Medications: Scheduled Meds:  . bisacodyl  5 mg Oral Daily  . dexamethasone  4 mg Oral Daily  . enoxaparin (LOVENOX) injection  40 mg Subcutaneous Q24H  . HYDROmorphone   Intravenous Q4H  . morphine  100 mg Oral Q12H  . pantoprazole (PROTONIX) IV  40 mg Intravenous Q24H  . polyethylene glycol  17 g Oral BID  . senna-docusate  1 tablet Oral BID    Continuous Infusions: . potassium chloride 10 mEq (02/27/21 1003)  . promethazine (PHENERGAN) injection 12.5 mg (02/27/21 0856)    PRN Meds: diphenhydrAMINE **OR** diphenhydrAMINE, ipratropium-albuterol, LORazepam, mineral oil, naloxone **AND** sodium chloride flush, ondansetron (ZOFRAN) IV, oxyCODONE, polyethylene glycol, promethazine (PHENERGAN) injection, sodium chloride flush  Physical Exam         Awake alert resting in bed today In good spirits No acute  distress Regular work of breathing Abdomen has mild generalized tenderness No focal deficits No edema Erythematous rash noted on the back, husband states it is much improved than before.  Vital Signs: BP (!) 143/83 (BP Location: Right Arm)   Pulse 66   Temp 98.4 F (36.9 C) (Oral)   Resp 16   Ht 5\' 6"  (1.676 m)   Wt 54.4 kg   LMP  (LMP Unknown)   SpO2 100%   BMI 19.37 kg/m  SpO2: SpO2: 100 % O2 Device: O2 Device: Room [employer]  O2 Flow Rate: O2 Flow Rate (L/min): 0 L/min  Intake/output summary:   Intake/Output Summary (Last 24 hours) at 02/27/2021 1040 Last data filed at 02/26/2021 1330 Gross per 24 hour  Intake --  Output 400 ml  Net -400 ml   LBM: Last BM Date: 02/26/21 Baseline Weight: Weight: 54.4 kg Most recent weight: Weight: 54.4 kg      Palliative performance scale 50%. Palliative Assessment/Data:      Patient Active Problem List   Diagnosis Date Noted  . Constipation   . Chronic obstructive pulmonary disease ([place])   . Cancer related pain 02/24/2021  . Hypomagnesemia 02/24/2021  . Neck pain 11/18/2020  . Diarrhea 11/18/2020  . Abnormal findings on diagnostic imaging of other specified body structures 11/18/2020  . Port-A-Cath in place 09/12/2020  . Abdominal pain 09/06/2020  . Obstruction of biliary stent 09/06/2020  . Genetic testing 04/03/2020  . Chemotherapy induced nausea and vomiting 04/01/2020  . Hypokalemia due to excessive gastrointestinal loss of potassium 04/01/2020  . Pancreatic cancer ([place]) 03/20/2020  . Goals of care, counseling/discussion 03/20/2020  . Family history of breast cancer   . Family history of cystic fibrosis   . Foot pain, right 11/25/2018  . Collagenous colitis 02/08/2018  . Family history of Alzheimer's disease 02/08/2018  . Psoriasis 02/12/2015  . 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 Bradycardia, symptomatic with fatigue 09/13/2013  . Low back pain 02/23/2013  . Coronary artery disease involving  native coronary artery of native heart without angina pectoris 05/30/2012  . Physical exam 05/04/2012  . Allergic rhinitis, seasonal 03/25/2012  . Hyperlipidemia 06/19/2010  . Essential hypertension 06/19/2010  . MYOCARDIAL INFARCTION, HX OF 06/19/2010    Palliative Care Assessment & Plan   Patient Profile:    Assessment: 65 year old lady with pancreatic cancer, biliary stent, on chemotherapy recently, also with history of COPD chronic cancer related pain and hypertension dyslipidemia history of MI and collagenous colitis presented with nausea vomiting abdominal pain.  Recommendations/Plan:  Continue current pain and on pain symptom management regimen.  Discussed with patient about initiation of her p.o. pain regimen.  She is on long-acting opioids as well as short acting as needed.  Discussed with her about using p.o. opioids first as well as trying to use the bolus option on the PCA for breakthrough.  Will monitor for the patient's opioid needs.  Agree with oncology's recommendations, will see if the patient will benefit from home-based PCA for better pain control.  Suggested oral care, Biotene and frequent moistening of her mouth for sensation of xerostomia.  Continue to monitor.  Continue current bowel regimen.   Code Status:    Code Status Orders  (From admission, onward)         Start     Ordered   02/24/21 1647  Do not attempt resuscitation (DNR)  Continuous       Question Answer Comment  In the event of cardiac or respiratory ARREST Do not call a "code blue"   In the event of cardiac or respiratory ARREST Do not perform Intubation, CPR, defibrillation or ACLS   In the event of cardiac or respiratory ARREST Use medication by any route, position, wound care, and other measures to relive pain and suffering. May use oxygen, suction and manual treatment of airway obstruction as needed for comfort.      02/24/21 1646        Code Status History    Date Active Date Inactive  Code Status Order  ID Comments User Context   09/06/2020 0639 09/08/2020 1609 Full Code [id number]  Rise Patience, MD Inpatient   04/01/2020 0458 04/01/2020 2324 Full Code [id number]  Vernelle Emerald, MD Inpatient   Advance Care Planning Activity    Advance Directive Documentation   Flowsheet Row Most Recent Value  Type of Advance Directive Healthcare Power of Attorney, Living will  Pre-existing out of facility DNR order (yellow form or pink MOST form) --  "MOST" Form in Place? --       Prognosis:   Unable to determine  Discharge Planning:  To Be Determined: likely for home with palliative.   Care plan was discussed with  Patient, her husband and also with [facility] MD.   Thank you for allowing the Palliative Medicine Team to assist in the care of this patient.   Time In: 10 Time Out: 10.35 Total Time 35 Prolonged Time Billed  no       Greater than 50%  of this time was spent counseling and coordinating care related to the above assessment and plan.  Loistine Chance, MD  Please contact Palliative Medicine Team phone at [phone] for questions and concerns.

## 2021-02-27 NOTE — Evaluation (Signed)
Physical Therapy Evaluation Patient Details Name: Vicki [name] MRN: [id number] DOB: [birth year] Today's Date: 02/27/2021   History of Present Illness  65 year old female with past medical history of pancreatic cancer with biliary stent on gemcitabine/Abraxane (last received gemcitabine on 3/18 and plan for chemo on 3/31), COPD, chronic cancer-related pain on chronic opiates, hypertension, hyperlipidemia, MI and cardiogenic shock, collagenous colitis who presented to the ED with emesis and abdominal pain unrelieved by home analgesia  Clinical Impression  Patient is very eager to  Ambulate, hoping to facilitate having a BM.  Patient Ambulated x 200' using Rw. Patient is encouraged to ask staff to assist her with ambulation.  Pt admitted with above diagnosis.  Pt currently with functional limitations due to the deficits listed below (see PT Problem List). Pt will benefit from skilled PT to increase their independence and safety with mobility to allow discharge to the venue listed below.       Follow Up Recommendations  none    Equipment Recommendations    none   Recommendations for Other Services       Precautions / Restrictions Precautions Precautions: Fall Restrictions Weight Bearing Restrictions: No      Mobility  Bed Mobility Overal bed mobility: Independent                  Transfers Overall transfer level: Needs assistance Equipment used: Rolling walker (2 wheeled) Transfers: Sit to/from Stand Sit to Stand: Supervision            Ambulation/Gait Ambulation/Gait assistance: Supervision Gait Distance (Feet): 200 Feet Assistive device: Rolling walker (2 wheeled) Gait Pattern/deviations: Step-through pattern     General Gait Details: steady gait,  Stairs            Wheelchair Mobility    Modified Rankin (Stroke Patients Only)       Balance Overall balance assessment: No apparent balance deficits (not formally assessed)                                            Pertinent Vitals/Pain Pain Assessment: 0-10 Pain Score: 7  Pain Location: abd Pain Descriptors / Indicators: Discomfort;Pressure Pain Intervention(s): Monitored during session;PCA encouraged    Home Living Family/patient expects to be discharged to:: Private residence Living Arrangements: Spouse/significant other Available Help at Discharge: Family Type of Home: House Home Access: Stairs to enter Entrance Stairs-Rails: Can reach both Entrance Stairs-Number of Steps: 5 Home Layout: Bed/bath upstairs;1/2 bath on main level Home Equipment: Walker - 2 wheels      Prior Function Level of Independence: Independent         Comments: denies falls     Hand Dominance        Extremity/Trunk Assessment   Upper Extremity Assessment Upper Extremity Assessment: Overall WFL for tasks assessed    Lower Extremity Assessment Lower Extremity Assessment: Overall WFL for tasks assessed    Cervical / Trunk Assessment Cervical / Trunk Assessment: Normal  Communication   Communication: No difficulties  Cognition Arousal/Alertness: Awake/alert Behavior During Therapy: WFL for tasks assessed/performed Overall Cognitive Status: Within Functional Limits for tasks assessed                                        General Comments      Exercises Other  Exercises Other Exercises: performed standing  exercises holding onto RW, various : knee bends, marching,back stretches   Assessment/Plan    PT Assessment Patient needs continued PT services  PT Problem List Decreased activity tolerance;Pain       PT Treatment Interventions DME instruction;Therapeutic activities;Gait training;Functional mobility training;Stair training;Patient/family education    PT Goals (Current goals can be found in the Care Plan section)  Acute Rehab PT Goals Patient Stated Goal: to go to the BR, walk PT Goal Formulation: With patient Time For Goal  Achievement: 03/13/21 Potential to Achieve Goals: Good    Frequency Min 2X/week   Barriers to discharge        Co-evaluation               AM-PAC PT "6 Clicks" Mobility  Outcome Measure Help needed turning from your back to your side while in a flat bed without using bedrails?: None Help needed moving from lying on your back to sitting on the side of a flat bed without using bedrails?: None Help needed moving to and from a bed to a chair (including a wheelchair)?: A Little Help needed standing up from a chair using your arms (e.g., wheelchair or bedside chair)?: A Little Help needed to walk in hospital room?: A Little Help needed climbing 3-5 steps with a railing? : A Lot 6 Click Score: 19    End of Session   Activity Tolerance: Patient tolerated treatment well Patient left: in chair;with call bell/phone within reach;with chair alarm set;with family/visitor present Nurse Communication: Mobility status PT Visit Diagnosis: Unsteadiness on feet (R26.81)    Time: 7741-4239 PT Time Calculation (min) (ACUTE ONLY): 28 min   Charges:   PT Evaluation $PT Eval Low Complexity: 1 Low PT Treatments $Gait Training: 8-22 mins        [place] Pager [phone] Office [phone]'   Claretha Cooper 02/27/2021, 3:04 PM

## 2021-02-28 ENCOUNTER — Telehealth: Payer: Self-pay | Admitting: Oncology

## 2021-02-28 DIAGNOSIS — Z515 Encounter for palliative care: Secondary | ICD-10-CM | POA: Diagnosis not present

## 2021-02-28 DIAGNOSIS — R112 Nausea with vomiting, unspecified: Secondary | ICD-10-CM | POA: Diagnosis not present

## 2021-02-28 DIAGNOSIS — I2510 Atherosclerotic heart disease of native coronary artery without angina pectoris: Secondary | ICD-10-CM | POA: Diagnosis not present

## 2021-02-28 DIAGNOSIS — E876 Hypokalemia: Secondary | ICD-10-CM | POA: Diagnosis not present

## 2021-02-28 DIAGNOSIS — G893 Neoplasm related pain (acute) (chronic): Secondary | ICD-10-CM | POA: Diagnosis not present

## 2021-02-28 DIAGNOSIS — R109 Unspecified abdominal pain: Secondary | ICD-10-CM

## 2021-02-28 LAB — COMPREHENSIVE METABOLIC PANEL
ALT: 18 U/L (ref 0–44)
AST: 21 U/L (ref 15–41)
Albumin: 3.1 g/dL — ABNORMAL LOW (ref 3.5–5.0)
Alkaline Phosphatase: 79 U/L (ref 38–126)
Anion gap: 8 (ref 5–15)
BUN: 11 mg/dL (ref 8–23)
CO2: 27 mmol/L (ref 22–32)
Calcium: 8.5 mg/dL — ABNORMAL LOW (ref 8.9–10.3)
Chloride: 100 mmol/L (ref 98–111)
Creatinine, Ser: 0.53 mg/dL (ref 0.44–1.00)
GFR, Estimated: >60 mL/min (ref >60)
Glucose, Bld: 110 mg/dL — ABNORMAL HIGH (ref 70–99)
Potassium: 3.6 mmol/L (ref 3.5–5.1)
Sodium: 135 mmol/L (ref 135–145)
Total Bilirubin: 0.8 mg/dL (ref 0.3–1.2)
Total Protein: 5.7 g/dL — ABNORMAL LOW (ref 6.5–8.1)

## 2021-02-28 LAB — CBC
HCT: 32.8 % — ABNORMAL LOW (ref 36.0–46.0)
Hemoglobin: 10.8 g/dL — ABNORMAL LOW (ref 12.0–15.0)
MCH: 30.5 pg (ref 26.0–34.0)
MCHC: 32.9 g/dL (ref 30.0–36.0)
MCV: 92.7 fL (ref 80.0–100.0)
Platelets: 283 10*3/uL (ref 150–400)
RBC: 3.54 MIL/uL — ABNORMAL LOW (ref 3.87–5.11)
RDW: 16.1 % — ABNORMAL HIGH (ref 11.5–15.5)
WBC: 6.7 10*3/uL (ref 4.0–10.5)
nRBC: 0 % (ref 0.0–0.2)

## 2021-02-28 MED ORDER — LORATADINE 10 MG PO TABS
10.0000 mg | ORAL_TABLET | Freq: Every day | ORAL | Status: DC
  Administered 2021-02-28 – 2021-03-03 (×4): 10 mg via ORAL
  Filled 2021-02-28 (×4): qty 1

## 2021-02-28 MED ORDER — ACETAMINOPHEN 500 MG PO TABS
500.0000 mg | ORAL_TABLET | Freq: Two times a day (BID) | ORAL | Status: DC
  Administered 2021-03-01 – 2021-03-03 (×5): 500 mg via ORAL
  Filled 2021-02-28 (×6): qty 1

## 2021-02-28 MED ORDER — ONDANSETRON 4 MG PO TBDP
8.0000 mg | ORAL_TABLET | Freq: Three times a day (TID) | ORAL | Status: DC | PRN
  Administered 2021-03-01: 8 mg via ORAL
  Filled 2021-02-28: qty 2

## 2021-02-28 MED ORDER — POTASSIUM CHLORIDE CRYS ER 10 MEQ PO TBCR
40.0000 meq | EXTENDED_RELEASE_TABLET | Freq: Once | ORAL | Status: AC
  Administered 2021-02-28: 40 meq via ORAL
  Filled 2021-02-28: qty 4

## 2021-02-28 MED ORDER — ASPIRIN EC 81 MG PO TBEC
81.0000 mg | DELAYED_RELEASE_TABLET | Freq: Every day | ORAL | Status: DC
Start: 2021-02-28 — End: 2021-03-08
  Administered 2021-02-28 – 2021-03-07 (×8): 81 mg via ORAL
  Filled 2021-02-28 (×8): qty 1

## 2021-02-28 MED ORDER — MAGIC MOUTHWASH
15.0000 mL | Freq: Four times a day (QID) | ORAL | Status: DC
  Administered 2021-02-28 – 2021-03-08 (×18): 15 mL via ORAL
  Filled 2021-02-28 (×35): qty 15

## 2021-02-28 MED ORDER — PANTOPRAZOLE SODIUM 40 MG PO TBEC
40.0000 mg | DELAYED_RELEASE_TABLET | Freq: Two times a day (BID) | ORAL | Status: DC
  Administered 2021-02-28 – 2021-03-08 (×16): 40 mg via ORAL
  Filled 2021-02-28 (×16): qty 1

## 2021-02-28 MED ORDER — PANTOPRAZOLE SODIUM 40 MG PO TBEC: 40.0000 mg | DELAYED_RELEASE_TABLET | Freq: Two times a day (BID) | ORAL | Status: DC

## 2021-02-28 MED ORDER — PROMETHAZINE HCL 25 MG PO TABS: 12.5000 mg | ORAL_TABLET | Freq: Four times a day (QID) | ORAL | Status: DC | PRN

## 2021-02-28 MED ORDER — HYDROMORPHONE HCL 1 MG/ML IJ SOLN
1.0000 mg | Freq: Four times a day (QID) | INTRAMUSCULAR | Status: DC | PRN
  Administered 2021-03-01: 1 mg via INTRAVENOUS
  Filled 2021-02-28: qty 1

## 2021-02-28 MED ORDER — PANTOPRAZOLE SODIUM 40 MG PO TBEC: 40.0000 mg | DELAYED_RELEASE_TABLET | Freq: Every day | ORAL | Status: DC

## 2021-02-28 MED ORDER — OXYCODONE HCL 5 MG PO TABS
20.0000 mg | ORAL_TABLET | ORAL | Status: DC | PRN
  Administered 2021-02-28 – 2021-03-02 (×12): 20 mg via ORAL
  Filled 2021-02-28 (×12): qty 4

## 2021-02-28 NOTE — Telephone Encounter (Signed)
Called and spoke with patient regarding appointments added per 4/1 sch msg

## 2021-02-28 NOTE — Progress Notes (Signed)
[facility] ([facility])  Hospital Liaison RN note         Notified by TOC manager of patient/family request for [facility] Palliative services at home after discharge.              [facility] Palliative team will follow up with patient after discharge.         Please call with any hospice or palliative related questions.         Thank you for the opportunity to participate in this patient's care.     Chrislyn King, BSN, RN [facility] Hospital Liaison (listed on AMION under Hospice/[facility])    [phone] (24h on call)    

## 2021-02-28 NOTE — TOC Initial Note (Signed)
Transition of Care [facility]) - Initial/Assessment Note    Patient Details  Name: Vicki [name] MRN: [id number] Date of Birth: 1956-01-06  Transition of Care [facility]) CM/SW Contact:    Lynnell Catalan, RN Phone Number: 02/28/2021, 2:01 PM  Clinical Narrative:                 [facility] consult for outpatient palliative referral. Referral called to [facility]. Per physical therapy eval pt has no recommendations for physical therapy follow up or DME needs.        Activities of Daily Living Home Assistive Devices/Equipment: Eyeglasses ADL Screening (condition at time of admission) Patient's cognitive ability adequate to safely complete daily activities?: Yes Is the patient deaf or have difficulty hearing?: No Does the patient have difficulty seeing, even when wearing glasses/contacts?: No Does the patient have difficulty concentrating, remembering, or making decisions?: No Patient able to express need for assistance with ADLs?: Yes Does the patient have difficulty dressing or bathing?: Yes Independently performs ADLs?: No Communication: Independent Dressing (OT): Needs assistance Is this a change from baseline?: Change from baseline, expected to last >3 days Grooming: Independent Feeding: Independent Bathing: Needs assistance Is this a change from baseline?: Change from baseline, expected to last >3 days Toileting: Needs assistance Is this a change from baseline?: Change from baseline, expected to last >3days In/Out Bed: Needs assistance Is this a change from baseline?: Change from baseline, expected to last >3 days Walks in Home: Needs assistance Is this a change from baseline?: Change from baseline, expected to last >3 days Does the patient have difficulty walking or climbing stairs?: Yes Weakness of Legs: Both Weakness of Arms/Hands: None  Permission Sought/Granted                  Emotional Assessment              Admission diagnosis:  Hypokalemia  [E87.6] Cancer related pain [G89.3] Abdominal pain, unspecified abdominal location [R10.9] Patient Active Problem List   Diagnosis Date Noted  . Constipation   . Chronic obstructive pulmonary disease ([place])   . Cancer related pain 02/24/2021  . Hypomagnesemia 02/24/2021  . Neck pain 11/18/2020  . Diarrhea 11/18/2020  . Abnormal findings on diagnostic imaging of other specified body structures 11/18/2020  . Port-A-Cath in place 09/12/2020  . Abdominal pain 09/06/2020  . Obstruction of biliary stent 09/06/2020  . Genetic testing 04/03/2020  . Chemotherapy induced nausea and vomiting 04/01/2020  . Hypokalemia due to excessive gastrointestinal loss of potassium 04/01/2020  . Pancreatic cancer ([place]) 03/20/2020  . Goals of care, counseling/discussion 03/20/2020  . Family history of breast cancer   . Family history of cystic fibrosis   . Foot pain, right 11/25/2018  . Collagenous colitis 02/08/2018  . Family history of Alzheimer's disease 02/08/2018  . Psoriasis 02/12/2015  . 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 Bradycardia, symptomatic with fatigue 09/13/2013  . Low back pain 02/23/2013  . Coronary artery disease involving native coronary artery of native heart without angina pectoris 05/30/2012  . Physical exam 05/04/2012  . Allergic rhinitis, seasonal 03/25/2012  . Hyperlipidemia 06/19/2010  . Essential hypertension 06/19/2010  . MYOCARDIAL INFARCTION, HX OF 06/19/2010   PCP:  Midge Minium, MD Pharmacy:   [facility], [address] [place] [address] Phone: [phone] Fax: [phone]  [facility] #[id number] - [address] AT [facility] [address] [place] [address] Phone: [phone] Fax: [phone]     Social Determinants of Health (SDOH) Interventions    Readmission Risk Interventions Readmission Risk Prevention  Plan 02/28/2021  Transportation Screening Complete  PCP or Specialist Appt within 3-5 Days Complete  HRI or [place] Complete  Social Work Consult for [place] Planning/Counseling Complete  Palliative Care Screening Complete  Medication Review Press photographer) Complete  Some recent data might be hidden

## 2021-02-28 NOTE — Progress Notes (Signed)
PROGRESS NOTE    Vicki [name]  ZSW:[id number] DOB: [birth year] DOA: 02/24/2021 PCP: Midge Minium, MD    Chief Complaint  Patient presents with  . Abdominal Pain    Brief Narrative 65 year old female with past medical history of pancreatic cancer with biliary stent on gemcitabine/Abraxane(last received gemcitabine on 3/18 and plan for chemo on 3/31), COPD, chronic cancer-related painon chronic opiates, hypertension, hyperlipidemia, MI and cardiogenic shock, collagenous colitis who presented to the ED with emesis and abdominal pain unrelieved by home analgesia. Patient placed on a Dilaudid PCA pump.  Palliative care consulted to help with pain management.  Oncology following.   Assessment & Plan:   Principal Problem:   Cancer related pain Active Problems:   Hyperlipidemia   Essential hypertension   Coronary artery disease involving native coronary artery of native heart without angina pectoris   Pancreatic cancer (H[facility])   Hypokalemia due to excessive gastrointestinal loss of potassium   Hypomagnesemia   Constipation   Chronic obstructive pulmonary disease (HCC)  1 acute on chronic pancreatic cancer related abdominal pain with nausea and vomiting -CT abdomen and pelvis done with concern for progression of pancreatic cancer and nodal metastatic disease with small volume ascites and anasarca. -Patient noted at home to be on MS Contin 100 mg every 8 hours, oxycodone 30 mg every 4 hours as needed which was taking regularly with no significant relief. -Was on IV Dilaudid PCA pump which has been discontinued per oncology this morning.  -Palliative care consulted and are following and adjusting and titrating pain medications. -IV scheduled Zofran has been changed to every 6 hours as needed.   -Continue Decadron for nausea per oncology..   -Patient has been restarted back on home regimen of MS Contin 100 mg every 12 hours per oncology.  Oxycodone as needed breakthrough pain.  -Diet  advanced to a soft/regular diet this morning. -Supportive car5e  2.  Constipation Patient with complaints of constipation which is improving as patient starting to have some bowel movements.. -Patient on chronic pain medications. -Continue current bowel regimen of MiraLAX twice daily, Senokot-S twice daily, Dulcolax daily.  Mineral oil enema as needed.   3.  Hypokalemia -Secondary to GI losses.   -Potassium at 3.6.  Follow.   4.  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 -Patient being followed by oncology who recommended hospice care if patient's performance status has not improved over the next week. -Continue MS Contin twice daily. -Dilaudid PCA discontinued per oncology and patient currently on home regimen oxycodone as needed breakthrough pain. -Palliative care following and helping manage patient's pain regimen. -Per oncology.  5.  Hypomagnesemia Repleted.  6.  Rash of unclear etiology -Asymptomatic -Supportive care.  7.  COPD -DuoNebs as needed.   8.  Hypertension - likely component of pain. -Not on antihypertensive medications on home -Blood pressure improved.   9.  Hyperlipidemia/CAD/history of STEMI -Stable.   -Aspirin and statin on hold during this hospitalization due to nausea and emesis and will resume statin on discharge.   -Resume aspirin -Supportive care.    DVT prophylaxis: Lovenox Code Status: DNR Family Communication: Updated patient and husband and son at bedside. Disposition:   Status is: Inpatient    Dispo: The patient is from: Home              Anticipated d/c is to: Likely home.  Patient currently with pain management underway and pain medication being titrated per palliative care.  Not stable for discharge.    Difficult to place patient no       Consultants:   Palliative care: Dr. Rowe Pavy  02/26/2021  Oncology: Dr. Benay Spice 02/25/2021  Procedures:  CT abdomen and pelvis 02/24/2021  Acute abdominal series 02/24/2021    Antimicrobials:   None   Subjective: Patient sleeping but arousable.  Some complaints of back pain.  No further emesis or nausea.  Tolerated about a quarter of breakfast this morning per husband who is at bedside.  Dilaudid PCA pump discontinued this morning per oncology.  Patient stated had small bowel movement yesterday.    Objective: Vitals:   02/28/21 0449 02/28/21 0551 02/28/21 0744 02/28/21 1021  BP:  138/75  138/75  Pulse:  (!) 57  70  Resp: 13 14 14 16   Temp:  (!) 97.3 F (36.3 C)  98.1 F (36.7 C)  TempSrc:  Oral  Oral  SpO2:  99% 94% 100%  Weight:      Height:        Intake/Output Summary (Last 24 hours) at 02/28/2021 1052 Last data filed at 02/28/2021 0302 Gross per 24 hour  Intake 410 ml  Output --  Net 410 ml   Filed Weights   02/24/21 1115  Weight: 54.4 kg    Examination:  General exam: : NAD Respiratory system: Lungs clear to auscultation bilaterally..  No wheezes, no rhonchi.  Speaking in full sentences.  Normal respiratory effort. Cardiovascular system: Regular rate and rhythm no murmurs rubs or gallops.  No JVD.  No lower extremity edema.  Gastrointestinal system: Abdomen soft, some tenderness to palpation in the lower abdominal region, nondistended, positive bowel sounds.  No rebound.  No guarding.  Central nervous system: Alert and oriented. No focal neurological deficits. Extremities: Symmetric 5 x 5 power. Skin: No rashes, lesions or ulcers Psychiatry: Judgement and insight appear normal. Mood & affect appropriate.  Data Reviewed: I have personally reviewed following labs and imaging studies  CBC: Recent Labs  Lab 02/24/21 1139 02/25/21 1032 02/26/21 0526 02/27/21 0525 02/28/21 0603  WBC 4.0 5.5 3.5* 5.4 6.7  NEUTROABS 3.2  --   --   --   --   HGB 10.3* 11.1* 11.0* 11.3* 10.8*  HCT 30.9* 32.3* 33.1*  33.6* 32.8*  MCV 91.4 89.2 91.2 91.3 92.7  PLT 226 250 266 288 222    Basic Metabolic Panel: Recent Labs  Lab 02/24/21 1139 02/25/21 1032 02/26/21 0526 02/27/21 0525 02/28/21 0603  NA 137 133* 135 135 135  K 2.5* 2.6* 3.4* 3.7 3.6  CL 99 95* 96* 98 100  CO2 27 28 26 24 27   GLUCOSE 121* 99 85 81 110*  BUN 6* 6* 13 17 11   CREATININE 0.31* 0.50 0.56 0.60 0.53  CALCIUM 8.3* 8.0* 8.5* 8.4* 8.5*  MG 1.6* 2.0 2.0  --   --     GFR: Estimated Creatinine Clearance: 59.4 mL/min (by C-G formula based on SCr of 0.53 mg/dL).  Liver Function Tests: Recent Labs  Lab 02/24/21 1139 02/25/21 1032 02/26/21 0526 02/27/21 0525 02/28/21 0603  AST 28 22 20 17 21   ALT 22 20 20 19 18   ALKPHOS 102 93 97 79 79  BILITOT 0.6 0.7 1.1 1.1 0.8  PROT 6.1* 5.8* 6.2* 6.0* 5.7*  ALBUMIN 3.4* 3.1* 3.4* 3.2* 3.1*    CBG: No results for input(s): GLUCAP in the last 168 hours.   Recent  Results (from the past 240 hour(s))  SARS CORONAVIRUS 2 (TAT 6-24 HRS) Nasopharyngeal Nasopharyngeal Swab     Status: None   Collection Time: 02/24/21  3:04 PM   Specimen: Nasopharyngeal Swab  Result Value Ref Range Status   SARS Coronavirus 2 NEGATIVE NEGATIVE Final    Comment: (NOTE) SARS-CoV-2 target nucleic acids are NOT DETECTED.  The SARS-CoV-2 RNA is generally detectable in upper and lower respiratory specimens during the acute phase of infection. Negative results do not preclude SARS-CoV-2 infection, do not rule out co-infections with other pathogens, and should not be used as the sole basis for treatment or other patient management decisions. Negative results must be combined with clinical observations, patient history, and epidemiological information. The expected result is Negative.  Fact Sheet for Patients: SugarRoll.be  Fact Sheet for Healthcare Providers: https://www.woods-mathews.com/  This test is not yet approved or cleared by the [place] FDA  and  has been authorized for detection and/or diagnosis of SARS-CoV-2 by FDA under an Emergency Use Authorization (EUA). This EUA will remain  in effect (meaning this test can be used) for the duration of the COVID-19 declaration under Se ction 564(b)(1) of the Act, 21 U.S.C. section 360bbb-3(b)(1), unless the authorization is terminated or revoked sooner.  Performed at [facility], [place] [address] [place], [place] [address]          Radiology Studies: No results found.      Scheduled Meds: . bisacodyl  5 mg Oral Daily  . dexamethasone  4 mg Oral Daily  . enoxaparin (LOVENOX) injection  40 mg Subcutaneous Q24H  . magic mouthwash  15 mL Oral QID  . morphine  100 mg Oral Q12H  . pantoprazole (PROTONIX) IV  40 mg Intravenous Q24H  . polyethylene glycol  17 g Oral BID  . senna-docusate  1 tablet Oral BID   Continuous Infusions:    LOS: 4 days    Time spent: 35 minutes    Irine Seal, MD [facility]   To contact the attending provider between 7A-7P or the covering provider during after hours 7P-7A, please log into the web site [email or web address] and access using universal [place] password for that web site. If you do not have the password, please call the hospital operator.  02/28/2021, 10:52 AM

## 2021-02-28 NOTE — Progress Notes (Addendum)
HEMATOLOGY-ONCOLOGY PROGRESS NOTE  SUBJECTIVE: The patient continues to feel better today.  Pain is overall controlled.  She has not been using the as needed oxycodone but has instead been using the Dilaudid PCA.  She is not having any nausea or vomiting this morning.  Only had a small bowel movement.  Tolerating diet.  Oncology History  Pancreatic cancer ([place])  03/20/2020 Initial Diagnosis   Pancreatic cancer ([place])   03/28/2020 - 11/27/2020 Chemotherapy         03/31/2020 Genetic Testing   Negative genetic testing on the common hereditary cancer panel and pancreatitis cancer panel.  The custom Hereditary Gene Panel offered by [facility] includes sequencing and/or deletion duplication testing of the following 56 genes: APC*, ATM*, AXIN2, BARD1, BMPR1A, BRCA1, BRCA2, BRIP1, CASR, CDH1, CDK4, CDKN2A (p14ARF), CDKN2A (p16INK4a), CFTR*, CHEK2, CPA1, CTNNA1, CTRC, DICER1*, EPCAM*, FANCC, GREM1*, HOXB13, KIT, MEN1*, MLH1*, MSH2*, MSH3*, MSH6*, MUTYH, NBN, NF1*, NTHL1, PALB2, PALLD, PDGFRA, PMS2*, POLD1*, POLE, PRSS1*, PTEN*, RAD50, RAD51C, RAD51D, RNF43, SDHA*, SDHB, SDHC*, SDHD, SMAD4, SMARCA4, SPINK1, STK11, TP53, TSC1*, TSC2, and VHL The report date is 03/31/2020.   01/08/2021 -  Chemotherapy    Patient is on Treatment Plan: PANCREATIC ABRAXANE / GEMCITABINE D1,8,15 Q28D       PHYSICAL EXAMINATION:  Vitals:   02/28/21 0551 02/28/21 0744  BP: 138/75   Pulse: (!) 57   Resp: 14 14  Temp: (!) 97.3 F (36.3 C)   SpO2: 99% 94%   Filed Weights   02/24/21 1115  Weight: 54.4 kg    Intake/Output from previous day: 03/31 0701 - 04/01 0700 In: 610 [P.O.:560; IV Piggyback:50] Out: -   GENERAL: Chronically ill-appearing.  No acute distress. SKIN: Erythematous rash noted to back, legs, chest OROPHARYNX: No thrush or ulcers. LUNGS: Clear bilaterally HEART: regular rate & rhythm  ABDOMEN: Positive bowel sounds, soft, mild generalized tenderness. NEURO: alert & oriented x 3 with fluent speech, no  focal motor/sensory deficits  LABORATORY DATA:  I have reviewed the data as listed CMP Latest Ref Rng & Units 02/28/2021 02/27/2021 02/26/2021  Glucose 70 - 99 mg/dL 110(H) 81 85  BUN 8 - 23 mg/dL _0 Creatinine 0.44 - 1.00 mg/dL 0.53 0.60 0.56  Sodium 135 - 145 mmol/L 135 135 135  Potassium 3.5 - 5.1 mmol/L 3.6 3.7 3.4(L)  Chloride 98 - 111 mmol/L 100 98 96(L)  CO2 22 - 32 mmol/L _1 Calcium 8.9 - 10.3 mg/dL 8.5(L) 8.4(L) 8.5(L)  Total Protein 6.5 - 8.1 g/dL 5.7(L) 6.0(L) 6.2(L)  Total Bilirubin 0.3 - 1.2 mg/dL 0.8 1.1 1.1  Alkaline Phos 38 - 126 U/L 79 79 97  AST 15 - 41 U/L _2 ALT 0 - 44 U/L _3 Lab Results  Component Value Date   WBC 6.7 02/28/2021   HGB 10.8 (L) 02/28/2021   HCT 32.8 (L) 02/28/2021   MCV 92.7 02/28/2021   PLT 283 02/28/2021   NEUTROABS 3.2 02/24/2021    CT Abdomen Pelvis Wo Contrast  Result Date: 02/24/2021 CLINICAL DATA:  Abdominal distension, back and abdominal pain, pancreatic cancer EXAM: CT ABDOMEN AND PELVIS WITHOUT CONTRAST TECHNIQUE: Multidetector CT imaging of the abdomen and pelvis was performed following the standard protocol without IV contrast. COMPARISON:  11/14/2020 FINDINGS: Lower chest: No acute abnormality. Hepatobiliary: No solid liver abnormality is seen. No gallstones or gallbladder wall thickening. Redemonstrated common bile duct stent with post stenting pneumobilia. Pancreas: Although ill-defined and difficult to  clearly evaluate, particularly by noncontrast examination, pancreatic head mass appears significantly enlarged, overall dimensions of the pancreatic head approximately 4.4 x 4.3 cm, previously 3.1 x 2.9 cm when measured similarly (series 2, image 25). Spleen: Normal in size without significant abnormality. Adrenals/Urinary Tract: Adrenal glands are unremarkable. Kidneys are normal, without renal calculi, solid lesion, or hydronephrosis. Bladder is unremarkable. Stomach/Bowel: Stomach is within normal limits.  Appendix appears normal. No evidence of bowel wall thickening, distention, or inflammatory changes. Vascular/Lymphatic: Aortic atherosclerosis. Although indistinct and difficult to precisely measure, enlarged, matted left periaortic lymph nodes appear increased in size compared to prior examination, measuring approximately 3.5 x 2.7 cm, previously 2.4 x 2.1 cm when measured similarly (series 2, image 23). Reproductive: No mass or other significant abnormality. Other: No abdominal wall hernia or abnormality. Anasarca. Small volume ascites. Musculoskeletal: No acute or significant osseous findings. IMPRESSION: 1. Although ill-defined and difficult to clearly evaluate, particularly by noncontrast examination, pancreatic head mass appears significantly enlarged compared to prior examination, consistent with worsened primary pancreatic adenocarcinoma. 2. Although indistinct and difficult to precisely measure, enlarged, matted left periaortic lymph nodes appear increased in size compared to prior examination, concerning for worsened nodal metastatic disease. 3. Redemonstrated common bile duct stent with post stenting pneumobilia. 4. Small volume ascites and anasarca. Aortic Atherosclerosis (ICD10-I70.0). Electronically Signed   By: Eddie Candle M.D.   On: 02/24/2021 13:46   DG Chest 2 View  Result Date: 01/31/2021 CLINICAL DATA:  Shortness of breath and a cough for 2 days. Pancreatic cancer. EXAM: CHEST - 2 VIEW COMPARISON:  11/14/2020 chest CT.  09/12/2020 plain film. FINDINGS: Hyperinflation. Midline trachea. Normal heart size. Atherosclerosis in the transverse aorta. No pleural effusion or pneumothorax. Diffuse peribronchial thickening. No lobar consolidation. Biliary stent. No free intraperitoneal air. IMPRESSION: 1. No acute cardiopulmonary disease. 2. Hyperinflation and interstitial thickening, most consistent with COPD/chronic bronchitis. Electronically Signed   By: Abigail Miyamoto M.D.   On: 01/31/2021 13:25    DG ABD ACUTE 2+V W 1V CHEST  Result Date: 02/24/2021 CLINICAL DATA:  Worsening abdominal pain with nausea and vomiting. History of pancreatic cancer. EXAM: DG ABDOMEN ACUTE WITH 1 VIEW CHEST COMPARISON:  Chest radiographs 01/31/2021.  Abdominal CT 11/14/2020. FINDINGS: Frontal chest radiograph demonstrates mild patient rotation to the right. Right IJ Port-A-Cath appears unchanged at the level of the superior cavoatrial junction. The heart size and mediastinal contours are stable allowing for the rotation. The lungs are clear. There is no pleural effusion or pneumothorax. Metallic biliary stent appears unchanged in position. Associated expected pneumobilia appears less evident. The bowel gas pattern is normal. There is no free intraperitoneal air. There is stable mild degenerative changes in the spine. IMPRESSION: 1. No acute cardiopulmonary or abdominal process identified. 2. Stable position of the biliary stent. Pneumobilia not well visualized; if concern of biliary obstruction, consider further evaluation with follow-up CT. Electronically Signed   By: Richardean Sale M.D.   On: 02/24/2021 12:12    ASSESSMENT AND PLAN: 1. 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 MRI abdomen 02/01/2020-2.2 x 3.3 cm mass in the posterior pancreas head/uncinate process, mild intrahepatic/extrahepatic ductal dilatation, no evidence of vascular invasion, possible small lymph nodes in the porta hepatis-poorly visualized ? EUS 02/29/2020-20 x 23 mm mass in the pancreas head, upstream pancreatic duct dilatation, 1 abnormal peripancreatic node, uT3?uN1 ? 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 CTs 03/19/2020-2.9 x 2.1 x 3.1 cm pancreas head/uncinate mass, lesion associated with  infrarenal abdominal aorta and superior mesenteric artery, prominent and borderline enlarged retroperitoneal nodes including a left periaortic node, indeterminate 4 mm lung  nodule ? Cycle 1 mFOLFIRINOX 2/44/01, complicated by n/v and hospital admission 5/2/ - 5/3 ? [facility] opinion at [facility] with Dr. Earnestine Mealing and Dr. Mariah Milling 04/02/20 ? PET scan 04/03/2020-hypermetabolic poorly marginated pancreatic head mass; 2 hypermetabolic left periaortic lymph nodes, hypermetabolic left supraclavicular lymph node ? Cycle 2 FOLFIRINOX 04/11/2020 ? Ultrasound-guided biopsy of a left supraclavicular mass on 04/18/2020-poorly differentiated adenocarcinoma, cytokeratin 7+, cytokeratin 20 and CDX2 positive in rare cells. ? Cycle 3 FOLFIRINOX 04/24/2020-oxaliplatin infusion lengthened to 3 hours, Udenyca was not given (ordered) ? Cycle 4 FOLFIRINOX 05/08/2020 ? Cycle 5 FOLFIRINOX 05/22/2020-atropine and Ativan premedication added secondary to cholinergic symptoms and nausea from irinotecan ? CTs 05/29/2020-decreased left supraclavicular lymph node, slight decrease in size of the pancreas head mass, decrease in retroperitoneal lymphadenopathy ? Cycle 6 FOLFIRINOX 06/05/2020 ? Cycle 7 FOLFIRINOX 06/26/2020 ? Cycle 8 FOLFIRINOX 07/11/2020 (oxaliplatin held secondary to neuropathy) ? Cycle 9 FOLFIRINOX 07/24/2020 (oxaliplatin held secondary to neuropathy) ? Cycle 10 FOLFIRINOX 08/07/2020 (oxaliplatin held secondary to neuropathy) ? CTs 08/19/2020-subtle 5 mm peripheral right liver lesion, pancreas mass unchanged and difficult to discretely delineate, unchanged left retroperitoneal node, no evidence of disease progression, onDr. Larissa Pegg'sreview of the CTs there is no change in the previously enlarged left supraclavicular node ? Cycle 11 FOLFIRINOX 09/18/2020 (oxaliplatin held secondary to neuropathy) ? Cycle 12 FOLFIRINOX 10/08/2020 (oxaliplatin held secondary to neuropathy) ? Cycle 13 FOLFIRINOX 10/30/2020 (oxaliplatin held secondary to neuropathy) ? CTs 11/14/2020-no evidence of thoracic metastases, slight enlargement of the pancreas head mass, increase in size of periaortic lymph node ? Cycle 14 FOLFIRINOX  11/25/2020 (oxaliplatin held secondary to neuropathy) ? Cycle 1 gemcitabine/Abraxane 01/08/2021 ? Cycle 2 gemcitabine/Abraxane 01/30/2021 ? Cycle 3 gemcitabine 02/14/2021, Abraxane held due to progressive neuropathy  2. Abdomen/back pain secondary to #1-progressive January 2022 3. Anorexia/weight loss secondary to #1 4. History of coronary artery disease,STEMI with the fib arrest 2011, status post RCA stent 5. Constipation, secondary to #1 and morphine 6. 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 Metal stent placed into the common bile duct 09/06/2020. 9.Hoarsenessstatus post vocal cord injections2/24/2022  Ms. Bergland appears improved.  Her pain is overall controlled at this time and she is not having any recurrent nausea or vomiting.  She still has some intermittent constipation.  Her MS Contin was restarted yesterday at 100 mg every 12 hours.  She is little bit sedated this morning and we will not increase the MS Contin to every 8 hours.  She continues to use a Dilaudid PCA and has not used any oral oxycodone for breakthrough pain.  We will discontinue Dilaudid PCA today and try to get her to use oral medication for pain control.  For nausea, will continue dexamethasone 4 mg p.o. daily.  I have discontinued IV Zofran and placed order for Zofran ODT which I recommend that she continue at discharge.  I have also changed IV Phenergan to p.o. Phenergan.  Diet has been advanced to regular.  The patient is interested in home palliative care referral.  Recommendations: 1.  Continue MS Contin 100 mg every 12 hours.  We will hold off on increasing this to every 8  hours due to sedation  this morning. 2.  Discontinue Dilaudid PCA.  Use oxycodone as needed for breakthrough pain. 3.  Advance diet to regular. 4.  Continue dexamethasone 4 mg daily.  Zofran and Phenergan have been changed to p.o. 5.  Recommend palliative care upon discharge. 6.  Continue current bowel regimen. 7.  Please call Oncology as needed over the weekend.   LOS: 4 days   Mikey Bussing,  AGPCNP-BC 02/28/21 Ms. Naab was interviewed and examined.  She has experienced improvement in pain and nausea over the past few days.  We will continue MS Contin at the current dose.  I encouraged her to use oxycodone as needed for breakthrough pain.  The Dilaudid PCA will be discontinued.  Ms. Leng would like to consider resuming chemotherapy as an outpatient.  We will schedule outpatient follow-up next week.  I discussed the case with her daughter last night.  She understands Ms. Randleman will not be eligible for hospice care unless she decides to discontinue further chemotherapy.  We will make a referral for home palliative care.  She is stable for discharge today if she is tolerating oral narcotics and a diet.  I was present for greater than 50% of today's visit.  I perform medical decision making.

## 2021-02-28 NOTE — Progress Notes (Signed)
Daily Progress Note   Patient Name: Vicki [name]       Date: 02/28/2021 DOB: 1956/09/11  Age: 65 y.o. MRN#: [id number] Attending Physician: Eugenie Filler, MD Primary Care Physician: Midge Minium, MD Admit Date: 02/24/2021  Reason for Consultation/Follow-up: Pain control   Subjective: Patient is sitting up in bed, she states that she felt better earlier during the day, now she is having severe abdominal pain.  She states that she is having bilateral lower quadrant pain right worse than left.  She also complains of lower back discomfort.  She is eating well.  She has had a small bowel movement she thinks she is still constipated.  Patient is very concerned about going home, patient son and daughter present at the bedside and have concerns about the patient having ongoing pain.  We discussed about pain and on pain symptom management in detail.   Length of Stay: 4  Current Medications: Scheduled Meds:  . acetaminophen  500 mg Oral BID  . bisacodyl  5 mg Oral Daily  . dexamethasone  4 mg Oral Daily  . enoxaparin (LOVENOX) injection  40 mg Subcutaneous Q24H  . magic mouthwash  15 mL Oral QID  . morphine  100 mg Oral Q12H  . [START ON 03/01/2021] pantoprazole  40 mg Oral Daily  . polyethylene glycol  17 g Oral BID  . senna-docusate  1 tablet Oral BID    Continuous Infusions:   PRN Meds: HYDROmorphone (DILAUDID) injection, ipratropium-albuterol, LORazepam, mineral oil, ondansetron, oxyCODONE, promethazine, sodium chloride flush  Physical Exam         Awake alert sitting up in bed today mild acute distress Regular work of breathing Patient complains of cramping in lower abdomen No focal deficits No edema    Vital Signs: BP (!) 135/95 (BP Location: Left Arm)   Pulse 66    Temp 98.6 F (37 C) (Oral)   Resp 14   Ht 5\' 6"  (1.676 m)   Wt 54.4 kg   LMP  (LMP Unknown)   SpO2 99%   BMI 19.37 kg/m  SpO2: SpO2: 99 % O2 Device: O2 Device: Room Air O2 Flow Rate: O2 Flow Rate (L/min): 0 L/min  Intake/output summary:   Intake/Output Summary (Last 24 hours) at 02/28/2021 1505 Last data filed at 02/28/2021 1300 Gross per  24 hour  Intake 710 ml  Output --  Net 710 ml   LBM: Last BM Date: 02/26/21 Baseline Weight: Weight: 54.4 kg Most recent weight: Weight: 54.4 kg      Palliative performance scale 50%. Palliative Assessment/Data:      Patient Active Problem List   Diagnosis Date Noted  . Constipation   . Chronic obstructive pulmonary disease ([place])   . Cancer related pain 02/24/2021  . Hypomagnesemia 02/24/2021  . Neck pain 11/18/2020  . Diarrhea 11/18/2020  . Abnormal findings on diagnostic imaging of other specified body structures 11/18/2020  . Port-A-Cath in place 09/12/2020  . Abdominal pain 09/06/2020  . Obstruction of biliary stent 09/06/2020  . Genetic testing 04/03/2020  . Chemotherapy induced nausea and vomiting 04/01/2020  . Hypokalemia due to excessive gastrointestinal loss of potassium 04/01/2020  . Pancreatic cancer ([place]) 03/20/2020  . Goals of care, counseling/discussion 03/20/2020  . Family history of breast cancer   . Family history of cystic fibrosis   . Foot pain, right 11/25/2018  . Collagenous colitis 02/08/2018  . Family history of Alzheimer's disease 02/08/2018  . Psoriasis 02/12/2015  . 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 Bradycardia, symptomatic with fatigue 09/13/2013  . Low back pain 02/23/2013  . Coronary artery disease involving native coronary artery of native heart without angina pectoris 05/30/2012  . Physical exam 05/04/2012  . Allergic rhinitis, seasonal 03/25/2012  . Hyperlipidemia 06/19/2010  . Essential hypertension 06/19/2010  . MYOCARDIAL INFARCTION, HX OF 06/19/2010    Palliative  Care Assessment & Plan   Patient Profile:    Assessment: 65 year old lady with pancreatic cancer, biliary stent, on chemotherapy recently, also with history of COPD chronic cancer related pain and hypertension dyslipidemia history of MI and collagenous colitis presented with nausea vomiting abdominal pain.  Recommendations/Plan:  Continue current pain and non pain symptom management regimen: Patient is very concerned about going home and has uncontrolled symptoms of pain.  Daughter and son present at the bedside.  We discussed about appropriate symptom management.  Patient is on MiraLAX twice daily, repeat the enema and continue other bowel regimen.  We discussed about adding scheduled Tylenol low-dose.  We discussed about making the oxycodone available every 3 hours PRN.  Patient wishes to continue with her long-acting MS Contin at the current dose.  As a rescue, only for severe breakthrough pain, IV Dilaudid low-dose every 6 hours as needed is also going to be provided.  Patient is aware that this will not be available at home after discharge if she goes home on oral opioid regimen.  Patient's daughter states that the patient might be coming to live with her in [place] towards the end of this hospitalization.  We talked about the differences between hospice and palliative care.  Patient wishes to establish palliative care at home.  Will request TOC to start referral for home-based palliative care.   Code Status:    Code Status Orders  (From admission, onward)         Start     Ordered   02/24/21 1647  Do not attempt resuscitation (DNR)  Continuous       Question Answer Comment  In the event of cardiac or respiratory ARREST Do not call a "code blue"   In the event of cardiac or respiratory ARREST Do not perform Intubation, CPR, defibrillation or ACLS   In the event of cardiac or respiratory ARREST Use medication by any route, position, wound care, and other  measures to relive  pain and suffering. May use oxygen, suction and manual treatment of airway obstruction as needed for comfort.      02/24/21 1646        Code Status History    Date Active Date Inactive Code Status Order ID Comments User Context   09/06/2020 0639 09/08/2020 1609 Full Code [id number]  Rise Patience, MD Inpatient   04/01/2020 0458 04/01/2020 2324 Full Code [id number]  Vernelle Emerald, MD Inpatient   Advance Care Planning Activity    Advance Directive Documentation   Flowsheet Row Most Recent Value  Type of Advance Directive Healthcare Power of Attorney, Living will  Pre-existing out of facility DNR order (yellow form or pink MOST form) --  "MOST" Form in Place? --       Prognosis:   Unable to determine  Discharge Planning:  To Be Determined: likely for home with palliative.   Care plan was discussed with  Patient, her children and also with [facility] MD.   Thank you for allowing the Palliative Medicine Team to assist in the care of this patient.   Time In: 1500 Time Out: 1540 Total Time 40 Prolonged Time Billed  no       Greater than 50%  of this time was spent counseling and coordinating care related to the above assessment and plan.  Loistine Chance, MD  Please contact Palliative Medicine Team phone at [phone] for questions and concerns.

## 2021-03-01 DIAGNOSIS — R109 Unspecified abdominal pain: Secondary | ICD-10-CM | POA: Diagnosis not present

## 2021-03-01 DIAGNOSIS — Z7189 Other specified counseling: Secondary | ICD-10-CM | POA: Diagnosis not present

## 2021-03-01 DIAGNOSIS — R1084 Generalized abdominal pain: Secondary | ICD-10-CM

## 2021-03-01 DIAGNOSIS — Z515 Encounter for palliative care: Secondary | ICD-10-CM | POA: Diagnosis not present

## 2021-03-01 DIAGNOSIS — G893 Neoplasm related pain (acute) (chronic): Secondary | ICD-10-CM | POA: Diagnosis not present

## 2021-03-01 DIAGNOSIS — C259 Malignant neoplasm of pancreas, unspecified: Secondary | ICD-10-CM | POA: Diagnosis not present

## 2021-03-01 DIAGNOSIS — I2510 Atherosclerotic heart disease of native coronary artery without angina pectoris: Secondary | ICD-10-CM | POA: Diagnosis not present

## 2021-03-01 DIAGNOSIS — E876 Hypokalemia: Secondary | ICD-10-CM | POA: Diagnosis not present

## 2021-03-01 LAB — CBC
HCT: 29.9 % — ABNORMAL LOW (ref 36.0–46.0)
Hemoglobin: 9.9 g/dL — ABNORMAL LOW (ref 12.0–15.0)
MCH: 31 pg (ref 26.0–34.0)
MCHC: 33.1 g/dL (ref 30.0–36.0)
MCV: 93.7 fL (ref 80.0–100.0)
Platelets: 275 10*3/uL (ref 150–400)
RBC: 3.19 MIL/uL — ABNORMAL LOW (ref 3.87–5.11)
RDW: 16.4 % — ABNORMAL HIGH (ref 11.5–15.5)
WBC: 6.8 10*3/uL (ref 4.0–10.5)
nRBC: 0 % (ref 0.0–0.2)

## 2021-03-01 LAB — BASIC METABOLIC PANEL
Anion gap: 7 (ref 5–15)
BUN: 8 mg/dL (ref 8–23)
CO2: 27 mmol/L (ref 22–32)
Calcium: 8.4 mg/dL — ABNORMAL LOW (ref 8.9–10.3)
Chloride: 102 mmol/L (ref 98–111)
Creatinine, Ser: 0.51 mg/dL (ref 0.44–1.00)
GFR, Estimated: >60 mL/min (ref >60)
Glucose, Bld: 118 mg/dL — ABNORMAL HIGH (ref 70–99)
Potassium: 3.7 mmol/L (ref 3.5–5.1)
Sodium: 136 mmol/L (ref 135–145)

## 2021-03-01 LAB — MAGNESIUM: Magnesium: 1.6 mg/dL — ABNORMAL LOW (ref 1.7–2.4)

## 2021-03-01 MED ORDER — HYDROMORPHONE HCL 1 MG/ML IJ SOLN
1.0000 mg | INTRAMUSCULAR | Status: DC | PRN
  Administered 2021-03-01: 2 mg via INTRAVENOUS
  Administered 2021-03-01: 1 mg via INTRAVENOUS
  Administered 2021-03-02 (×3): 2 mg via INTRAVENOUS
  Filled 2021-03-01 (×2): qty 2
  Filled 2021-03-01: qty 1
  Filled 2021-03-01 (×2): qty 2

## 2021-03-01 MED ORDER — HYDROMORPHONE HCL 1 MG/ML IJ SOLN
1.0000 mg | INTRAMUSCULAR | Status: DC | PRN
  Administered 2021-03-01: 1 mg via INTRAVENOUS
  Filled 2021-03-01: qty 1

## 2021-03-01 MED ORDER — MAGNESIUM SULFATE 4 GM/100ML IV SOLN
4.0000 g | Freq: Once | INTRAVENOUS | Status: AC
  Administered 2021-03-01: 4 g via INTRAVENOUS
  Filled 2021-03-01: qty 100

## 2021-03-01 MED ORDER — HYDROMORPHONE HCL 1 MG/ML IJ SOLN
0.5000 mg | Freq: Once | INTRAMUSCULAR | Status: AC
Start: 2021-03-01 — End: 2021-03-01
  Administered 2021-03-01: 0.5 mg via INTRAVENOUS
  Filled 2021-03-01: qty 0.5

## 2021-03-01 MED ORDER — ALPRAZOLAM 0.5 MG PO TABS
0.5000 mg | ORAL_TABLET | Freq: Two times a day (BID) | ORAL | Status: DC
  Administered 2021-03-01 – 2021-03-04 (×7): 0.5 mg via ORAL
  Filled 2021-03-01 (×7): qty 1

## 2021-03-01 NOTE — Progress Notes (Signed)
Daily Progress Note   Patient Name: Vicki [name]       Date: 03/01/2021 DOB: [birth year]  Age: 64 y.o. MRN#: [id number] Attending Physician: Eugenie Filler, MD Primary Care Physician: Midge Minium, MD Admit Date: 02/24/2021  Reason for Consultation/Follow-up: Pain control   Subjective: Overnight events noted. Call received from patient's bedside RN at around 3 AM regarding the patient having consistent pain 10/10, while being on PO opioids.  Patient is sitting up in bed this morning, she slept for a few hours after receiving IV Dilaudid, her husband is at the bedside. She complains of ongoing back pain. Patient is very concerned about going home, she states she is worried that she will have to come right back if she goes home. We talked about adequate pain management options.   Patient did have a good afternoon yesterday, she visited with her family and spent quality time with them. How ever, she had severe pain overnight/earlier this am.   She is now on IV Dilaudid 1 mg Q 3 hours for severe pain, she is on PO scheduled and PRN Opioids for continued efforts at pain management. she is on anti emetic and bowel regimen as well.   See below.   Length of Stay: 5  Current Medications: Scheduled Meds:  . acetaminophen  500 mg Oral BID  . aspirin EC  81 mg Oral QHS  . bisacodyl  5 mg Oral Daily  . dexamethasone  4 mg Oral Daily  . enoxaparin (LOVENOX) injection  40 mg Subcutaneous Q24H  . loratadine  10 mg Oral Daily  . magic mouthwash  15 mL Oral QID  . morphine  100 mg Oral Q12H  . pantoprazole  40 mg Oral BID  . polyethylene glycol  17 g Oral BID  . senna-docusate  1 tablet Oral BID    Continuous Infusions:   PRN Meds: HYDROmorphone (DILAUDID) injection,  ipratropium-albuterol, LORazepam, mineral oil, ondansetron, oxyCODONE, promethazine, sodium chloride flush  Physical Exam         Awake alert sitting up in bed today mild acute distress Regular work of breathing Patient complains of ongoing low back pain.  No focal deficits No edema    Vital Signs: BP 121/62   Pulse 67   Temp 97.7 F (36.5 C) (Oral)   Resp 18  Ht 5\' 6"  (1.676 m)   Wt 54.4 kg   LMP  (LMP Unknown)   SpO2 98%   BMI 19.37 kg/m  SpO2: SpO2: 98 % O2 Device: O2 Device: Room Air O2 Flow Rate: O2 Flow Rate (L/min): 0 L/min  Intake/output summary:   Intake/Output Summary (Last 24 hours) at 03/01/2021 1112 Last data filed at 02/28/2021 1855 Gross per 24 hour  Intake 720 ml  Output --  Net 720 ml   LBM: Last BM Date: 02/26/21 Baseline Weight: Weight: 54.4 kg Most recent weight: Weight: 54.4 kg      Palliative performance scale 50%. Palliative Assessment/Data:      Patient Active Problem List   Diagnosis Date Noted  . Palliative care by specialist   . Constipation   . Chronic obstructive pulmonary disease ([place])   . Cancer related pain 02/24/2021  . Hypomagnesemia 02/24/2021  . Neck pain 11/18/2020  . Diarrhea 11/18/2020  . Abnormal findings on diagnostic imaging of other specified body structures 11/18/2020  . Port-A-Cath in place 09/12/2020  . Abdominal pain 09/06/2020  . Obstruction of biliary stent 09/06/2020  . Genetic testing 04/03/2020  . Chemotherapy induced nausea and vomiting 04/01/2020  . Hypokalemia due to excessive gastrointestinal loss of potassium 04/01/2020  . Pancreatic cancer ([place]) 03/20/2020  . Goals of care, counseling/discussion 03/20/2020  . Family history of breast cancer   . Family history of cystic fibrosis   . Foot pain, right 11/25/2018  . Collagenous colitis 02/08/2018  . Family history of Alzheimer's disease 02/08/2018  . Psoriasis 02/12/2015  . 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 Bradycardia,  symptomatic with fatigue 09/13/2013  . Low back pain 02/23/2013  . Coronary artery disease involving native coronary artery of native heart without angina pectoris 05/30/2012  . Physical exam 05/04/2012  . Allergic rhinitis, seasonal 03/25/2012  . Hyperlipidemia 06/19/2010  . Essential hypertension 06/19/2010  . MYOCARDIAL INFARCTION, HX OF 06/19/2010    Palliative Care Assessment & Plan   Patient Profile:    Assessment: 65 year old lady with pancreatic cancer, biliary stent, on chemotherapy recently, also with history of COPD chronic cancer related pain and hypertension dyslipidemia history of MI and collagenous colitis presented with nausea vomiting abdominal pain.  Recommendations/Plan:  Continue current pain and non pain symptom management regimen: MS Contin 100 mg every 12 hours.  Oxy IR PO 20 mg Q 3 hours PRN.  Dilaudid 1 mg IV Q 3 hours PRN severe pain.  If patient is consistently requiring Q 3 hours IV Dilaudid to control her pain, then it will be better to start bolus only Dilaudid PCA and to make arrangements for her to have this PCA at home upon discharge. Monitor how she does today and continue to assess, discussed with bedside RN.      Code Status:    Code Status Orders  (From admission, onward)         Start     Ordered   02/24/21 1647  Do not attempt resuscitation (DNR)  Continuous       Question Answer Comment  In the event of cardiac or respiratory ARREST Do not call a "code blue"   In the event of cardiac or respiratory ARREST Do not perform Intubation, CPR, defibrillation or ACLS   In the event of cardiac or respiratory ARREST Use medication by any route, position, wound care, and other measures to relive pain and suffering. May use oxygen, suction and manual treatment of airway obstruction as needed for  comfort.      02/24/21 1646        Code Status History    Date Active Date Inactive Code Status Order ID Comments User Context   09/06/2020 0639  09/08/2020 1609 Full Code [id number]  Rise Patience, MD Inpatient   04/01/2020 0458 04/01/2020 2324 Full Code [id number]  Vernelle Emerald, MD Inpatient   Advance Care Planning Activity    Advance Directive Documentation   Flowsheet Row Most Recent Value  Type of Advance Directive Healthcare Power of Attorney, Living will  Pre-existing out of facility DNR order (yellow form or pink MOST form) --  "MOST" Form in Place? --       Prognosis:   Unable to determine  Discharge Planning:  To Be Determined: likely for home with palliative.   Care plan was discussed with  Patient, her spouse    Thank you for allowing the Palliative Medicine Team to assist in the care of this patient.   Time In: 10 Time Out: 10.40 Total Time 40 Prolonged Time Billed  no       Greater than 50%  of this time was spent counseling and coordinating care related to the above assessment and plan.  Loistine Chance, MD  Please contact Palliative Medicine Team phone at [phone] for questions and concerns.  Please note that PMT consult service is only available from 7 AM to 7 PM, please contact primary service attending for care outside of these hours.

## 2021-03-01 NOTE — Progress Notes (Signed)
PROGRESS NOTE    JOSSLYN CIOLEK  GEX:[id number] DOB: 25-Aug-1956 DOA: 02/24/2021 PCP: Midge Minium, MD    Chief Complaint  Patient presents with  . Abdominal Pain    Brief Narrative 65 year old female with past medical history of pancreatic cancer with biliary stent on gemcitabine/Abraxane(last received gemcitabine on 3/18 and plan for chemo on 3/31), COPD, chronic cancer-related painon chronic opiates, hypertension, hyperlipidemia, MI and cardiogenic shock, collagenous colitis who presented to the ED with emesis and abdominal pain unrelieved by home analgesia. Patient placed on a Dilaudid PCA pump.  Palliative care consulted to help with pain management.  Oncology following.   Assessment & Plan:   Principal Problem:   Cancer related pain Active Problems:   Hyperlipidemia   Essential hypertension   Coronary artery disease involving native coronary artery of native heart without angina pectoris   Pancreatic cancer (HCC)   Hypokalemia due to excessive gastrointestinal loss of potassium   Hypomagnesemia   Constipation   Chronic obstructive pulmonary disease (HCC)   Palliative care by specialist  1 acute on chronic pancreatic cancer related abdominal pain with nausea and vomiting -CT abdomen and pelvis done with concern for progression of pancreatic cancer and nodal metastatic disease with small volume ascites and anasarca. -Patient noted at home to be on MS Contin 100 mg every 8 hours, oxycodone 30 mg every 4 hours as needed which was taking regularly with no significant relief. -Was on IV Dilaudid PCA pump which has been discontinued per oncology this morning.  -Palliative care consulted and are following and adjusting and titrating pain medications. -IV scheduled Zofran has been changed to every 6 hours as needed.   -Continue Decadron for nausea per oncology..   -Patient has been restarted back on home regimen of MS Contin 100 mg every 12 hours per oncology.  Oxycodone as  needed breakthrough pain.  -IV Dilaudid 1 to 2 mg every 3 hours as needed has been added for pain not controlled with oral oxycodone. -Palliative care managing pain control. -Continue soft/regular diet. -Supportive care.  2.  Constipation Patient with complaints of constipation which has improved.  -Patient on chronic pain medications.  -Continue current bowel regimen of MiraLAX twice daily, Senokot-S twice daily, Dulcolax daily.  -Mineral oil enema as needed.   3.  Hypokalemia -Secondary to GI losses.   -Potassium at 3.6.  Follow.   4.  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 -Patient being followed by oncology who recommended hospice care if patient's performance status has not improved over the next week. -Continue MS Contin twice daily. -Dilaudid PCA discontinued per oncology and patient currently on home regimen oxycodone as needed breakthrough pain. -Pain not currently controlled. -Palliative care following and titrating pain medication for better control. -Placed on IV Dilaudid 1 to 2 mg every 3 hours as needed and if further pain control is needed could decrease to every 2 hours or every 1 hour as needed.  5.  Hypomagnesemia -Magnesium at 1.6 today.   -Magnesium sulfate 4 g IV x1.   6.  Rash of unclear etiology -Stable.   -Outpatient follow-up.  7.  COPD -DuoNebs as needed.   8.  Hypertension - likely component of pain. -Not on antihypertensive medications prior to admission  -Blood pressure improved.   9.  Hyperlipidemia/CAD/history of STEMI -Stable.   -Patient now tolerating oral intake and as such aspirin was resumed.   -  Continue to hold statin and resume on discharge.   -Supportive care.    DVT prophylaxis: Lovenox Code Status: DNR Family Communication: Updated patient and husband at bedside. Disposition:   Status is:  Inpatient    Dispo: The patient is from: Home              Anticipated d/c is to: Likely home.              Patient currently with pain management underway, pain not currently controlled, and pain medication being titrated per palliative care.  Not stable for discharge.    Difficult to place patient no       Consultants:   Palliative care: Dr. Rowe Pavy 02/26/2021  Oncology: Dr. Benay Spice 02/25/2021  Procedures:  CT abdomen and pelvis 02/24/2021  Acute abdominal series 02/24/2021    Antimicrobials:   None   Subjective: Patient complaining of back pain this morning.   Patient states pain is not adequately controlled in order to be able to be discharged home.  No chest pain.  No shortness of breath.  Tolerating diet.  States has some occasional wheezing in her throat which is unchanged from prior to admission which she follows up with ENT for.   Objective: Vitals:   02/28/21 1021 02/28/21 1348 02/28/21 2036 03/01/21 0433  BP: 138/75 (!) 135/95 132/76 121/62  Pulse: 70 66 75 67  Resp: 16 14 18 18   Temp: 98.1 F (36.7 C) 98.6 F (37 C) 97.7 F (36.5 C) 97.7 F (36.5 C)  TempSrc: Oral Oral Oral Oral  SpO2: 100% 99% 100% 98%  Weight:      Height:        Intake/Output Summary (Last 24 hours) at 03/01/2021 1027 Last data filed at 02/28/2021 1855 Gross per 24 hour  Intake 720 ml  Output --  Net 720 ml   Filed Weights   02/24/21 1115  Weight: 54.4 kg    Examination:  General exam: : NAD Respiratory system: CTA B.  No wheezes, no crackles, no rhonchi.  Normal respiratory effort.  Speaking in full sentences.  Cardiovascular system: RRR no murmurs rubs or gallops.  No JVD.  No lower extremity edema.  Gastrointestinal system: Abdomen is soft, decreased tenderness to palpation lower abdominal region, nondistended, positive bowel sounds.  No rebound.  No guarding.  Central nervous system: Alert and oriented.  No focal neurological deficits. Extremities: Symmetric 5 x 5  power. Skin: No rashes, lesions or ulcers Psychiatry: Judgement and insight appear normal. Mood & affect appropriate.  Data Reviewed: I have personally reviewed following labs and imaging studies  CBC: Recent Labs  Lab 02/24/21 1139 02/25/21 1032 02/26/21 0526 02/27/21 0525 02/28/21 0603 03/01/21 0425  WBC 4.0 5.5 3.5* 5.4 6.7 6.8  NEUTROABS 3.2  --   --   --   --   --   HGB 10.3* 11.1* 11.0* 11.3* 10.8* 9.9*  HCT 30.9* 32.3* 33.1* 33.6* 32.8* 29.9*  MCV 91.4 89.2 91.2 91.3 92.7 93.7  PLT 226 250 266 288 283 144    Basic Metabolic Panel: Recent Labs  Lab 02/24/21 1139 02/25/21 1032 02/26/21 0526 02/27/21 0525 02/28/21 0603 03/01/21 0425  NA 137 133* 135 135 135 136  K 2.5* 2.6* 3.4* 3.7 3.6 3.7  CL 99 95* 96* 98 100 102  CO2 27 28 26 24 27 27   GLUCOSE 121* 99 85 81 110* 118*  BUN 6* 6* 13 17 11 8   CREATININE 0.31* 0.50 0.56 0.60 0.53 0.51  CALCIUM 8.3* 8.0* 8.5* 8.4* 8.5* 8.4*  MG 1.6* 2.0 2.0  --   --  1.6*    GFR: Estimated Creatinine Clearance: 59.4 mL/min (by C-G formula based on SCr of 0.51 mg/dL).  Liver Function Tests: Recent Labs  Lab 02/24/21 1139 02/25/21 1032 02/26/21 0526 02/27/21 0525 02/28/21 0603  AST 28 22 20 17 21   ALT 22 20 20 19 18   ALKPHOS 102 93 97 79 79  BILITOT 0.6 0.7 1.1 1.1 0.8  PROT 6.1* 5.8* 6.2* 6.0* 5.7*  ALBUMIN 3.4* 3.1* 3.4* 3.2* 3.1*    CBG: No results for input(s): GLUCAP in the last 168 hours.   Recent Results (from the past 240 hour(s))  SARS CORONAVIRUS 2 (TAT 6-24 HRS) Nasopharyngeal Nasopharyngeal Swab     Status: None   Collection Time: 02/24/21  3:04 PM   Specimen: Nasopharyngeal Swab  Result Value Ref Range Status   SARS Coronavirus 2 NEGATIVE NEGATIVE Final    Comment: (NOTE) SARS-CoV-2 target nucleic acids are NOT DETECTED.  The SARS-CoV-2 RNA is generally detectable in upper and lower respiratory specimens during the acute phase of infection. Negative results do not preclude SARS-CoV-2  infection, do not rule out co-infections with other pathogens, and should not be used as the sole basis for treatment or other patient management decisions. Negative results must be combined with clinical observations, patient history, and epidemiological information. The expected result is Negative.  Fact Sheet for Patients: SugarRoll.be  Fact Sheet for Healthcare Providers: https://www.woods-mathews.com/  This test is not yet approved or cleared by the [place] FDA and  has been authorized for detection and/or diagnosis of SARS-CoV-2 by FDA under an Emergency Use Authorization (EUA). This EUA will remain  in effect (meaning this test can be used) for the duration of the COVID-19 declaration under Se ction 564(b)(1) of the Act, 21 U.S.C. section 360bbb-3(b)(1), unless the authorization is terminated or revoked sooner.  Performed at [facility], [place] [address] [place], [place] [address]          Radiology Studies: No results found.      Scheduled Meds: . acetaminophen  500 mg Oral BID  . aspirin EC  81 mg Oral QHS  . bisacodyl  5 mg Oral Daily  . dexamethasone  4 mg Oral Daily  . enoxaparin (LOVENOX) injection  40 mg Subcutaneous Q24H  . loratadine  10 mg Oral Daily  . magic mouthwash  15 mL Oral QID  . morphine  100 mg Oral Q12H  . pantoprazole  40 mg Oral BID  . polyethylene glycol  17 g Oral BID  . senna-docusate  1 tablet Oral BID   Continuous Infusions: . magnesium sulfate bolus IVPB 4 g (03/01/21 0834)     LOS: 5 days    Time spent: 35 minutes    Irine Seal, MD [facility]   To contact the attending provider between 7A-7P or the covering provider during after hours 7P-7A, please log into the web site [email or web address] and access using universal [place] password for that web site. If you do not have the password, please call the hospital operator.  03/01/2021, 10:27 AM

## 2021-03-01 NOTE — Progress Notes (Signed)
Family Meeting Note.  Received call from patient's RN that patient's daughter has arrived at the bedside and has questions about the patient's current pain regimen and also that the patient wished to discuss further about her overall goals of care.   Patient states that she has had ongoing back and abdominal discomfort, at times her pain is controlled enough and at times, her pain is really exacerbated.   Patient and daughter state that they are aware of the serious nature of the patient's illness. They are wondering about how the patient might be able to tolerate the next course of treatment.   We discussed about seeking input from oncology regarding next steps and treatment options that will match the patient's goals. She describes herself as a loving person who loves life, loves everyone and is usually not a [employer] officer, trade [employer]. She complains of exacerbated anxiety from her current condition. Differences between home with palliative care versus home with hospice care were explored.   We talked about pain management options, discussed about opioid rotating from MS Contin to trans dermal Fentanyl since the patient's PRN opioid use is escalating.   Offered active listening and supportive empathic presence. All of the patient's and family's concerns addressed to the best of my ability.   Additional 35 minutes spent Time in 1:30 PM Time out 2:05 PM    Greater than 50%  of this time was spent counseling and coordinating care related to the above assessment and plan.

## 2021-03-01 NOTE — Progress Notes (Signed)
   03/01/21 0230  Provider Notification  Provider Name/Title Lovey Newcomer  Date Provider Notified 03/01/21  Time Provider Notified 0230  Notification Type Page  Notification Reason Other (Comment) (Pain 9/10; all pain PRNs exhausted at this time)  Provider response Other (Comment) (MD gave a one time dose of 0.5mg IV Diluadid & explained for RN to contact Palliative team for further medications)  Date of Provider Response 03/01/21  Time of Provider Response [phone]

## 2021-03-01 NOTE — Progress Notes (Signed)
   03/01/21 0230  Provider Notification  Provider Name/Title Loistine Chance MD  Date Provider Notified 03/01/21  Time Provider Notified 0230  Notification Type Page  Notification Reason Other (Comment) (Pain 9/10; all pain PRNs exhausted at this time)  Provider response Other (Comment) (MD gave a one time dose of 0.5mg IV Diluadid & explained for RN to contact Palliative team for further medications)  Date of Provider Response 03/01/21  Time of Provider Response 0235  0.5 mg IV Dilaudid ordered by primary team was given and ineffective, patient still in pain with no improvement. Palliative team contacted per primary MD request for all other pain medications. RN was able to get ahold of palliative team however they explained that they do not typically have coverage at night time but was able to give TORB for RN to help patient with pain control.

## 2021-03-02 DIAGNOSIS — E876 Hypokalemia: Secondary | ICD-10-CM | POA: Diagnosis not present

## 2021-03-02 DIAGNOSIS — R109 Unspecified abdominal pain: Secondary | ICD-10-CM | POA: Diagnosis not present

## 2021-03-02 DIAGNOSIS — Z515 Encounter for palliative care: Secondary | ICD-10-CM | POA: Diagnosis not present

## 2021-03-02 DIAGNOSIS — R52 Pain, unspecified: Secondary | ICD-10-CM | POA: Diagnosis not present

## 2021-03-02 DIAGNOSIS — I2510 Atherosclerotic heart disease of native coronary artery without angina pectoris: Secondary | ICD-10-CM | POA: Diagnosis not present

## 2021-03-02 DIAGNOSIS — G893 Neoplasm related pain (acute) (chronic): Secondary | ICD-10-CM | POA: Diagnosis not present

## 2021-03-02 LAB — BASIC METABOLIC PANEL
Anion gap: 6 (ref 5–15)
BUN: 6 mg/dL — ABNORMAL LOW (ref 8–23)
CO2: 29 mmol/L (ref 22–32)
Calcium: 8.1 mg/dL — ABNORMAL LOW (ref 8.9–10.3)
Chloride: 102 mmol/L (ref 98–111)
Creatinine, Ser: 0.37 mg/dL — ABNORMAL LOW (ref 0.44–1.00)
GFR, Estimated: >60 mL/min (ref >60)
Glucose, Bld: 92 mg/dL (ref 70–99)
Potassium: 3.6 mmol/L (ref 3.5–5.1)
Sodium: 137 mmol/L (ref 135–145)

## 2021-03-02 LAB — MAGNESIUM: Magnesium: 2.1 mg/dL (ref 1.7–2.4)

## 2021-03-02 MED ORDER — DIPHENHYDRAMINE HCL 12.5 MG/5ML PO ELIX: 12.5000 mg | ORAL_SOLUTION | Freq: Four times a day (QID) | ORAL | Status: DC | PRN

## 2021-03-02 MED ORDER — HYDROMORPHONE 1 MG/ML IV SOLN
INTRAVENOUS | Status: DC
  Administered 2021-03-02: 5.53 mg via INTRAVENOUS
  Administered 2021-03-02: 0.205 mg via INTRAVENOUS
  Administered 2021-03-03: 30 mg via INTRAVENOUS
  Administered 2021-03-03: 12.49 mg via INTRAVENOUS
  Administered 2021-03-03: 8.05 mg via INTRAVENOUS
  Filled 2021-03-02: qty 30

## 2021-03-02 MED ORDER — DIPHENHYDRAMINE HCL 50 MG/ML IJ SOLN: 12.5000 mg | Freq: Four times a day (QID) | INTRAMUSCULAR | Status: DC | PRN

## 2021-03-02 MED ORDER — HYDROMORPHONE HCL 2 MG/ML IJ SOLN
2.0000 mg | Freq: Once | INTRAMUSCULAR | Status: DC
  Filled 2021-03-02: qty 1

## 2021-03-02 MED ORDER — HYDROMORPHONE 1 MG/ML IV SOLN
INTRAVENOUS | Status: DC
Start: 2021-03-02 — End: 2021-03-02
  Administered 2021-03-02: 30 mg via INTRAVENOUS
  Administered 2021-03-02: 7.5 mg via INTRAVENOUS
  Filled 2021-03-02: qty 30

## 2021-03-02 MED ORDER — SODIUM CHLORIDE 0.9% FLUSH: 9.0000 mL | INTRAVENOUS | Status: DC | PRN

## 2021-03-02 MED ORDER — ONDANSETRON HCL 4 MG/2ML IJ SOLN
4.0000 mg | Freq: Four times a day (QID) | INTRAMUSCULAR | Status: DC | PRN
  Administered 2021-03-03: 4 mg via INTRAVENOUS
  Filled 2021-03-02: qty 2

## 2021-03-02 MED ORDER — NALOXONE HCL 0.4 MG/ML IJ SOLN: 0.4000 mg | INTRAMUSCULAR | Status: DC | PRN

## 2021-03-02 MED ORDER — HYDROMORPHONE HCL 2 MG/ML IJ SOLN
2.0000 mg | INTRAMUSCULAR | Status: DC | PRN
  Administered 2021-03-02: 2 mg via INTRAVENOUS

## 2021-03-02 MED ORDER — LIDOCAINE 5 % EX PTCH
1.0000 | MEDICATED_PATCH | CUTANEOUS | Status: DC
  Administered 2021-03-02 – 2021-03-07 (×6): 1 via TRANSDERMAL
  Filled 2021-03-02 (×7): qty 1

## 2021-03-02 MED ORDER — FENTANYL 50 MCG/HR TD PT72
1.0000 | MEDICATED_PATCH | TRANSDERMAL | Status: DC
  Administered 2021-03-02: 1 via TRANSDERMAL
  Filled 2021-03-02: qty 1

## 2021-03-02 NOTE — Progress Notes (Signed)
PROGRESS NOTE    Vicki [name]  RCV:[id number] DOB: 1956/10/15 DOA: 02/24/2021 PCP: Midge Minium, MD    Chief Complaint  Patient presents with  . Abdominal Pain    Brief Narrative 65 year old female with past medical history of pancreatic cancer with biliary stent on gemcitabine/Abraxane(last received gemcitabine on 3/18 and plan for chemo on 3/31), COPD, chronic cancer-related painon chronic opiates, hypertension, hyperlipidemia, MI and cardiogenic shock, collagenous colitis who presented to the ED with emesis and abdominal pain unrelieved by home analgesia. Patient placed on a Dilaudid PCA pump.  Palliative care consulted to help with pain management.  Oncology following.   Assessment & Plan:   Principal Problem:   Cancer related pain Active Problems:   Hyperlipidemia   Essential hypertension   Coronary artery disease involving native coronary artery of native heart without angina pectoris   Pancreatic cancer (HCC)   Hypokalemia due to excessive gastrointestinal loss of potassium   Hypomagnesemia   Constipation   Chronic obstructive pulmonary disease (HCC)   Palliative care by specialist  1 acute on chronic pancreatic cancer related abdominal pain with nausea and vomiting -CT abdomen and pelvis done with concern for progression of pancreatic cancer and nodal metastatic disease with small volume ascites and anasarca. -Patient noted at home to be on MS Contin 100 mg every 8 hours, oxycodone 30 mg every 4 hours as needed which was taking regularly with no significant relief. -Was on IV Dilaudid PCA pump which was discontinued per oncology this morning.  -Palliative care consulted and are following and adjusting and titrating pain medications. -IV scheduled Zofran has been changed to every 6 hours as needed.   -Continue Decadron for nausea per oncology..   -Patient has been restarted back on home regimen of MS Contin 100 mg every 12 hours per oncology.  Oxycodone as  needed breakthrough pain, IV Dilaudid as needed. -Patient pain currently not well controlled.  Patient on MS Contin 100 mg twice daily required 150 mg of p.o. OxyIR over the past 24 hours, also 10 mg IV Dilaudid used over the past 24 hours. -Palliative care following and pain regimen being adjusted -Patient started on Dilaudid PCA pump today and patient being started on a fentanyl patch per palliative care. -Continue current soft/regular diet. -Palliative care managing pain medications.  2.  Constipation Patient with complaints of constipation early on in the hospitalization which has since resolved on current bowel regimen. -Patient on chronic pain medications.  -Continue current bowel regimen of MiraLAX twice daily, Senokot-S twice daily, Dulcolax daily.  -Mineral oil enema as needed.   3.  Hypokalemia -Secondary to GI losses.   -Potassium at 3.6.   4.  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 -Patient being followed by oncology who recommended hospice care if patient's performance status has not improved over the next week. -Currently on MS Contin twice daily oxycodone IR as needed breakthrough pain, IV Dilaudid as needed.. -Dilaudid PCA discontinued per oncology and patient currently on home regimen oxycodone as needed breakthrough pain. -Pain not currently controlled. -Palliative care following and titrating pain medication for better control. -Placed on IV Dilaudid 1 to 2 mg every 3 hours as needed, however patient with significant pain this morning and has been started on a Dilaudid PCA pump per palliative care.   -Patient also placed and fentanyl patch placed.   -Pain management per palliative care  -Oncology following.  5.  Hypomagnesemia -Repleted.  Magnesium at 2.1.   6.  Rash of unclear etiology -Stable.   -Outpatient follow-up.  7.  COPD -DuoNebs as  needed.   8.  Hypertension - likely component of pain. -Not on antihypertensive medications prior to admission  -Blood pressure currently controlled.     9.  Hyperlipidemia/CAD/history of STEMI -Patient tolerating oral intake.   -Continue aspirin.   -Statin on hold and will be resumed on discharge.   -Supportive care.     DVT prophylaxis: Lovenox Code Status: DNR Family Communication: Updated patient and husband at bedside. Disposition:   Status is: Inpatient    Dispo: The patient is from: Home              Anticipated d/c is to: Likely home.              Patient currently with pain management underway, pain not currently controlled, and pain medication being titrated per palliative care.  Not stable for discharge.    Difficult to place patient no       Consultants:   Palliative care: Dr. Rowe Pavy 02/26/2021  Oncology: Dr. Benay Spice 02/25/2021  Procedures:  CT abdomen and pelvis 02/24/2021  Acute abdominal series 02/24/2021    Antimicrobials:   None   Subjective: Sitting up on the side of the bed.   Still complaining of significant back pain not fully controlled at this time.   Patient received Dilaudid 2 mg IV x1 with no significant improvement early on this morning.  Palliative care following and Dilaudid PCA reordered. No chest pain.  No shortness of breath.  No significant abdominal pain.  Having bowel movement.  Tolerating diet but poor oral intake.   Objective: Vitals:   03/01/21 0433 03/01/21 1328 03/01/21 2157 03/02/21 0512  BP: 121/62 117/82 (!) 103/92 129/82  Pulse: 67 72 76 75  Resp: 18 15 18 17   Temp: 97.7 F (36.5 C) 98 F (36.7 C)    TempSrc: Oral Oral    SpO2: 98% 98% 99% 94%  Weight:      Height:        Intake/Output Summary (Last 24 hours) at 03/02/2021 1108 Last data filed at 03/01/2021 1300 Gross per 24 hour  Intake 240 ml  Output --  Net 240 ml   Filed Weights   02/24/21 1115  Weight: 54.4 kg    Examination: General exam: :  NAD Respiratory system: CTA B anterior lung fields.  No wheezes, no rhonchi.  Speaking in full sentences.  Normal respiratory effort. Cardiovascular system: Regular rate and rhythm no murmurs rubs or gallops.  No JVD.  No lower extremity edema.  Gastrointestinal system: Abdomen soft, nontender, nondistended, positive bowel sounds.  No rebound.  No guarding. Central nervous system: Alert and oriented. No focal neurological deficits. Extremities: Symmetric 5 x 5 power. Skin: No rashes, lesions or ulcers Psychiatry: Judgement and insight appear normal. Mood & affect appropriate.  Data Reviewed: I have personally reviewed following labs and imaging studies  CBC: Recent Labs  Lab 02/24/21 1139 02/25/21 1032 02/26/21 0526 02/27/21 0525 02/28/21 0603 03/01/21 0425  WBC 4.0 5.5 3.5* 5.4 6.7 6.8  NEUTROABS 3.2  --   --   --   --   --   HGB 10.3* 11.1* 11.0* 11.3* 10.8* 9.9*  HCT 30.9* 32.3* 33.1* 33.6* 32.8* 29.9*  MCV 91.4 89.2 91.2 91.3 92.7 93.7  PLT 226 250 266 288 283 364    Basic Metabolic Panel: Recent Labs  Lab  02/24/21 1139 02/25/21 1032 02/26/21 0526 02/27/21 0525 02/28/21 0603 03/01/21 0425 03/02/21 0541  NA 137 133* 135 135 135 136 137  K 2.5* 2.6* 3.4* 3.7 3.6 3.7 3.6  CL 99 95* 96* 98 100 102 102  CO2 27 28 26 24 27 27 29   GLUCOSE 121* 99 85 81 110* 118* 92  BUN 6* 6* 13 17 11 8  6*  CREATININE 0.31* 0.50 0.56 0.60 0.53 0.51 0.37*  CALCIUM 8.3* 8.0* 8.5* 8.4* 8.5* 8.4* 8.1*  MG 1.6* 2.0 2.0  --   --  1.6* 2.1    GFR: Estimated Creatinine Clearance: 59.4 mL/min (A) (by C-G formula based on SCr of 0.37 mg/dL (L)).  Liver Function Tests: Recent Labs  Lab 02/24/21 1139 02/25/21 1032 02/26/21 0526 02/27/21 0525 02/28/21 0603  AST 28 22 20 17 21   ALT 22 20 20 19 18   ALKPHOS 102 93 97 79 79  BILITOT 0.6 0.7 1.1 1.1 0.8  PROT 6.1* 5.8* 6.2* 6.0* 5.7*  ALBUMIN 3.4* 3.1* 3.4* 3.2* 3.1*    CBG: No results for input(s): GLUCAP in the last 168  hours.   Recent Results (from the past 240 hour(s))  SARS CORONAVIRUS 2 (TAT 6-24 HRS) Nasopharyngeal Nasopharyngeal Swab     Status: None   Collection Time: 02/24/21  3:04 PM   Specimen: Nasopharyngeal Swab  Result Value Ref Range Status   SARS Coronavirus 2 NEGATIVE NEGATIVE Final    Comment: (NOTE) SARS-CoV-2 target nucleic acids are NOT DETECTED.  The SARS-CoV-2 RNA is generally detectable in upper and lower respiratory specimens during the acute phase of infection. Negative results do not preclude SARS-CoV-2 infection, do not rule out co-infections with other pathogens, and should not be used as the sole basis for treatment or other patient management decisions. Negative results must be combined with clinical observations, patient history, and epidemiological information. The expected result is Negative.  Fact Sheet for Patients: SugarRoll.be  Fact Sheet for Healthcare Providers: https://www.woods-mathews.com/  This test is not yet approved or cleared by the [place] FDA and  has been authorized for detection and/or diagnosis of SARS-CoV-2 by FDA under an Emergency Use Authorization (EUA). This EUA will remain  in effect (meaning this test can be used) for the duration of the COVID-19 declaration under Se ction 564(b)(1) of the Act, 21 U.S.C. section 360bbb-3(b)(1), unless the authorization is terminated or revoked sooner.  Performed at [facility], [place] [address] [place], [place] [address]          Radiology Studies: No results found.      Scheduled Meds: . acetaminophen  500 mg Oral BID  . ALPRAZolam  0.5 mg Oral BID  . aspirin EC  81 mg Oral QHS  . bisacodyl  5 mg Oral Daily  . dexamethasone  4 mg Oral Daily  . enoxaparin (LOVENOX) injection  40 mg Subcutaneous Q24H  . fentaNYL  1 patch Transdermal Q72H  . HYDROmorphone   Intravenous Q4H  . loratadine  10 mg Oral Daily  . magic mouthwash  15 mL  Oral QID  . pantoprazole  40 mg Oral BID  . polyethylene glycol  17 g Oral BID  . senna-docusate  1 tablet Oral BID   Continuous Infusions:    LOS: 6 days    Time spent: 35 minutes    Irine Seal, MD [facility]   To contact the attending provider between 7A-7P or the covering provider during after hours 7P-7A, please log into the  web site [email or web address] and access using universal [place] password for that web site. If you do not have the password, please call the hospital operator.  03/02/2021, 11:08 AM

## 2021-03-02 NOTE — Progress Notes (Signed)
Daily Progress Note   Patient Name: Vicki [name]       Date: 03/02/2021 DOB: September 13, 1956  Age: 65 y.o. MRN#: [id number] Attending Physician: Eugenie Filler, MD Primary Care Physician: Midge Minium, MD Admit Date: 02/24/2021  Reason for Consultation/Follow-up: Pain control   Subjective: Ongoing back pan and generalized abdominal pain Husband at bedside.   See below.   Length of Stay: 6  Current Medications: Scheduled Meds:  . acetaminophen  500 mg Oral BID  . ALPRAZolam  0.5 mg Oral BID  . aspirin EC  81 mg Oral QHS  . bisacodyl  5 mg Oral Daily  . dexamethasone  4 mg Oral Daily  . enoxaparin (LOVENOX) injection  40 mg Subcutaneous Q24H  . fentaNYL  1 patch Transdermal Q72H  . HYDROmorphone   Intravenous Q4H  . loratadine  10 mg Oral Daily  . magic mouthwash  15 mL Oral QID  . pantoprazole  40 mg Oral BID  . polyethylene glycol  17 g Oral BID  . senna-docusate  1 tablet Oral BID    Continuous Infusions:   PRN Meds: diphenhydrAMINE **OR** diphenhydrAMINE, HYDROmorphone (DILAUDID) injection, ipratropium-albuterol, LORazepam, mineral oil, naloxone **AND** sodium chloride flush, ondansetron (ZOFRAN) IV, ondansetron, oxyCODONE, promethazine, sodium chloride flush  Physical Exam         In mild distress due to back and general abdomen pain  abdomen not distended Regular work of breathing Patient complains of ongoing low back pain.  No focal deficits No edema    Vital Signs: BP 129/82 (BP Location: Left Arm)   Pulse 75   Temp 98 F (36.7 C) (Oral)   Resp 17   Ht 5\' 6"  (1.676 m)   Wt 54.4 kg   LMP  (LMP Unknown)   SpO2 94%   BMI 19.37 kg/m  SpO2: SpO2: 94 % O2 Device: O2 Device: Room Air O2 Flow Rate: O2 Flow Rate (L/min): 0 L/min  Intake/output summary:    Intake/Output Summary (Last 24 hours) at 03/02/2021 1135 Last data filed at 03/01/2021 1300 Gross per 24 hour  Intake 240 ml  Output --  Net 240 ml   LBM: Last BM Date: 02/28/21 Baseline Weight: Weight: 54.4 kg Most recent weight: Weight: 54.4 kg      Palliative performance scale 40%. Palliative Assessment/Data:  Patient Active Problem List   Diagnosis Date Noted  . Palliative care by specialist   . Constipation   . Chronic obstructive pulmonary disease ([place])   . Cancer related pain 02/24/2021  . Hypomagnesemia 02/24/2021  . Neck pain 11/18/2020  . Diarrhea 11/18/2020  . Abnormal findings on diagnostic imaging of other specified body structures 11/18/2020  . Port-A-Cath in place 09/12/2020  . Abdominal pain 09/06/2020  . Obstruction of biliary stent 09/06/2020  . Genetic testing 04/03/2020  . Chemotherapy induced nausea and vomiting 04/01/2020  . Hypokalemia due to excessive gastrointestinal loss of potassium 04/01/2020  . Pancreatic cancer ([place]) 03/20/2020  . Goals of care, counseling/discussion 03/20/2020  . Family history of breast cancer   . Family history of cystic fibrosis   . Foot pain, right 11/25/2018  . Collagenous colitis 02/08/2018  . Family history of Alzheimer's disease 02/08/2018  . Psoriasis 02/12/2015  . 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 Bradycardia, symptomatic with fatigue 09/13/2013  . Low back pain 02/23/2013  . Coronary artery disease involving native coronary artery of native heart without angina pectoris 05/30/2012  . Physical exam 05/04/2012  . Allergic rhinitis, seasonal 03/25/2012  . Hyperlipidemia 06/19/2010  . Essential hypertension 06/19/2010  . MYOCARDIAL INFARCTION, HX OF 06/19/2010    Palliative Care Assessment & Plan   Patient Profile:    Assessment: 65 year old lady with pancreatic cancer, biliary stent, on chemotherapy recently, also with history of COPD chronic cancer related pain and  hypertension dyslipidemia history of MI and collagenous colitis presented with nausea vomiting abdominal pain.  Recommendations/Plan:  Discussed with patient regarding her pain management options. Patient's opioids noted: MS Contin 100 mg PO BID, total of 150 mg PO OxyIR used in the past 24 hours, total of 10 mg IV Dilaudid used in the past 24 hours.   Patient and daughter asked about Fentanyl patch, patient needs long acting medication at higher dosages, hence, will opioid rotate to trans dermal fentanyl today. In addition, will begin bolus only Dilaudid PCA for pain control that she will need to be set up with at home.   As discussed yesterday, patient wishes to further discuss with oncology regarding next steps in her cancer care, scope of further treatments and their risks/benefits given her current overall condition.       Code Status:    Code Status Orders  (From admission, onward)         Start     Ordered   02/24/21 1647  Do not attempt resuscitation (DNR)  Continuous       Question Answer Comment  In the event of cardiac or respiratory ARREST Do not call a "code blue"   In the event of cardiac or respiratory ARREST Do not perform Intubation, CPR, defibrillation or ACLS   In the event of cardiac or respiratory ARREST Use medication by any route, position, wound care, and other measures to relive pain and suffering. May use oxygen, suction and manual treatment of airway obstruction as needed for comfort.      02/24/21 1646        Code Status History    Date Active Date Inactive Code Status Order ID Comments User Context   09/06/2020 0639 09/08/2020 1609 Full Code [id number]  Rise Patience, MD Inpatient   04/01/2020 0458 04/01/2020 2324 Full Code [id number]  Shalhoub, Sherryll Burger, MD Inpatient   Advance Care Planning Activity    Advance Directive Documentation   Flowsheet Row Most Recent Value  Type of Advance Directive Healthcare Power of Attorney, Living will  Pre-existing  out of facility DNR order (yellow form or pink MOST form) --  "MOST" Form in Place? --       Prognosis:   Unable to determine  Discharge Planning:  To Be Determined: likely for home with palliative versus hospice.   Care plan was discussed with  Patient, her spouse    Thank you for allowing the Palliative Medicine Team to assist in the care of this patient.   Time In: 10 Time Out: 10.40 Total Time 40 Prolonged Time Billed  no       Greater than 50%  of this time was spent counseling and coordinating care related to the above assessment and plan.  Loistine Chance, MD  Please contact Palliative Medicine Team phone at [phone] for questions and concerns.  Please note that PMT consult service is only available from 7 AM to 7 PM, please contact primary service attending for care outside of these hours.

## 2021-03-03 ENCOUNTER — Telehealth: Payer: Self-pay | Admitting: Family Medicine

## 2021-03-03 DIAGNOSIS — R112 Nausea with vomiting, unspecified: Secondary | ICD-10-CM | POA: Diagnosis not present

## 2021-03-03 DIAGNOSIS — C250 Malignant neoplasm of head of pancreas: Secondary | ICD-10-CM

## 2021-03-03 DIAGNOSIS — J449 Chronic obstructive pulmonary disease, unspecified: Secondary | ICD-10-CM | POA: Diagnosis not present

## 2021-03-03 DIAGNOSIS — G893 Neoplasm related pain (acute) (chronic): Secondary | ICD-10-CM | POA: Diagnosis not present

## 2021-03-03 DIAGNOSIS — E876 Hypokalemia: Secondary | ICD-10-CM | POA: Diagnosis not present

## 2021-03-03 LAB — BASIC METABOLIC PANEL
Anion gap: 6 (ref 5–15)
BUN: 7 mg/dL — ABNORMAL LOW (ref 8–23)
CO2: 27 mmol/L (ref 22–32)
Calcium: 8.4 mg/dL — ABNORMAL LOW (ref 8.9–10.3)
Chloride: 102 mmol/L (ref 98–111)
Creatinine, Ser: 0.36 mg/dL — ABNORMAL LOW (ref 0.44–1.00)
GFR, Estimated: >60 mL/min (ref >60)
Glucose, Bld: 93 mg/dL (ref 70–99)
Potassium: 3.9 mmol/L (ref 3.5–5.1)
Sodium: 135 mmol/L (ref 135–145)

## 2021-03-03 LAB — CBC
HCT: 31.3 % — ABNORMAL LOW (ref 36.0–46.0)
Hemoglobin: 10.1 g/dL — ABNORMAL LOW (ref 12.0–15.0)
MCH: 30.6 pg (ref 26.0–34.0)
MCHC: 32.3 g/dL (ref 30.0–36.0)
MCV: 94.8 fL (ref 80.0–100.0)
Platelets: 333 10*3/uL (ref 150–400)
RBC: 3.3 MIL/uL — ABNORMAL LOW (ref 3.87–5.11)
RDW: 16.3 % — ABNORMAL HIGH (ref 11.5–15.5)
WBC: 5 10*3/uL (ref 4.0–10.5)
nRBC: 0 % (ref 0.0–0.2)

## 2021-03-03 LAB — MAGNESIUM: Magnesium: 2 mg/dL (ref 1.7–2.4)

## 2021-03-03 MED ORDER — ONDANSETRON 4 MG PO TBDP
4.0000 mg | ORAL_TABLET | Freq: Three times a day (TID) | ORAL | Status: DC
  Administered 2021-03-03 – 2021-03-08 (×16): 4 mg via ORAL
  Filled 2021-03-03 (×16): qty 1

## 2021-03-03 MED ORDER — SIMETHICONE 80 MG PO CHEW
80.0000 mg | CHEWABLE_TABLET | Freq: Four times a day (QID) | ORAL | Status: DC
  Administered 2021-03-03 – 2021-03-08 (×21): 80 mg via ORAL
  Filled 2021-03-03 (×21): qty 1

## 2021-03-03 MED ORDER — OLANZAPINE 5 MG PO TABS
2.5000 mg | ORAL_TABLET | Freq: Every day | ORAL | Status: DC
  Administered 2021-03-03 – 2021-03-07 (×5): 2.5 mg via ORAL
  Filled 2021-03-03 (×5): qty 1

## 2021-03-03 MED ORDER — ONDANSETRON HCL 4 MG/2ML IJ SOLN: 4.0000 mg | Freq: Four times a day (QID) | INTRAMUSCULAR | Status: DC | PRN

## 2021-03-03 MED ORDER — KETOROLAC TROMETHAMINE 30 MG/ML IJ SOLN: 15.0000 mg | Freq: Three times a day (TID) | INTRAMUSCULAR | Status: DC

## 2021-03-03 MED ORDER — METHADONE HCL 10 MG PO TABS
10.0000 mg | ORAL_TABLET | Freq: Once | ORAL | Status: AC
  Administered 2021-03-03: 10 mg via ORAL
  Filled 2021-03-03: qty 1

## 2021-03-03 MED ORDER — FENTANYL 50 MCG/ML IV PCA SOLN
INTRAVENOUS | Status: DC
  Administered 2021-03-03 (×2): 50 ug via INTRAVENOUS
  Administered 2021-03-04: 100 ug via INTRAVENOUS
  Administered 2021-03-04 (×3): 50 ug via INTRAVENOUS
  Administered 2021-03-04: 200 ug via INTRAVENOUS
  Administered 2021-03-04: 50 ug via INTRAVENOUS
  Administered 2021-03-04: 200 ug via INTRAVENOUS
  Administered 2021-03-05: 150 ug via INTRAVENOUS
  Administered 2021-03-05: 400 ug via INTRAVENOUS
  Administered 2021-03-05: 150 ug via INTRAVENOUS
  Filled 2021-03-03 (×3): qty 20

## 2021-03-03 MED ORDER — DIPHENHYDRAMINE HCL 50 MG/ML IJ SOLN: 12.5000 mg | Freq: Four times a day (QID) | INTRAMUSCULAR | Status: DC | PRN

## 2021-03-03 MED ORDER — METHADONE HCL 10 MG PO TABS
10.0000 mg | ORAL_TABLET | Freq: Three times a day (TID) | ORAL | Status: DC
  Administered 2021-03-03 – 2021-03-08 (×16): 10 mg via ORAL
  Filled 2021-03-03 (×16): qty 1

## 2021-03-03 MED ORDER — DEXAMETHASONE 4 MG PO TABS
8.0000 mg | ORAL_TABLET | Freq: Two times a day (BID) | ORAL | Status: DC
Start: 2021-03-04 — End: 2021-03-03

## 2021-03-03 MED ORDER — DEXAMETHASONE 4 MG PO TABS
8.0000 mg | ORAL_TABLET | Freq: Every day | ORAL | Status: DC
Start: 2021-03-04 — End: 2021-03-08
  Administered 2021-03-04 – 2021-03-08 (×5): 8 mg via ORAL
  Filled 2021-03-03 (×5): qty 2

## 2021-03-03 MED ORDER — FENTANYL CITRATE (PF) 100 MCG/2ML IJ SOLN
100.0000 ug | INTRAMUSCULAR | Status: DC | PRN
  Administered 2021-03-03 – 2021-03-05 (×4): 100 ug via INTRAVENOUS
  Filled 2021-03-03 (×5): qty 2

## 2021-03-03 MED ORDER — NALOXONE HCL 0.4 MG/ML IJ SOLN: 0.4000 mg | INTRAMUSCULAR | Status: DC | PRN

## 2021-03-03 MED ORDER — DEXAMETHASONE 4 MG PO TABS
4.0000 mg | ORAL_TABLET | Freq: Once | ORAL | Status: AC
  Administered 2021-03-03: 4 mg via ORAL
  Filled 2021-03-03: qty 1

## 2021-03-03 MED ORDER — FENTANYL 50 MCG/ML IV PCA SOLN
INTRAVENOUS | Status: DC
  Administered 2021-03-03: 220 ug via INTRAVENOUS
  Filled 2021-03-03: qty 20

## 2021-03-03 MED ORDER — SODIUM CHLORIDE 0.9% FLUSH: 9.0000 mL | INTRAVENOUS | Status: DC | PRN

## 2021-03-03 MED ORDER — DIPHENHYDRAMINE HCL 12.5 MG/5ML PO ELIX: 12.5000 mg | ORAL_SOLUTION | Freq: Four times a day (QID) | ORAL | Status: DC | PRN

## 2021-03-03 MED ORDER — KETOROLAC TROMETHAMINE 30 MG/ML IJ SOLN
30.0000 mg | Freq: Three times a day (TID) | INTRAMUSCULAR | Status: DC
  Administered 2021-03-03 – 2021-03-05 (×6): 30 mg via INTRAVENOUS
  Filled 2021-03-03 (×6): qty 1

## 2021-03-03 NOTE — Telephone Encounter (Signed)
Roderic Ovens from [facility] called in asking if Dr. Birdie Riddle would be the attending physician and be willing to sign off on orders, and pt's death cert. When that time comes.   Please advise

## 2021-03-03 NOTE — TOC Progression Note (Addendum)
Transition of Care [facility]) - Progression Note    Patient Details  Name: Vicki [name] MRN: [id number] Date of Birth: 01-18-1956  Transition of Care [facility]) CM/SW Contact  Gottfried Standish, Marjie Skiff, RN Phone Number: 03/03/2021, 12:50 PM  Clinical Narrative:    Addendum 1355:  Per Dr. Hilma Favors the [facility] needed has changed from [place] to [place]. Both companies have been notified.   Spoke with pt daughter and husband at bedside for dc planning as pt is sleeping soundly. Pt was previously set up for home palliative services with [facility]. Now they are wanting full hospice services and the pt will be going to the daughter's home at dc. That address is [place] [address]. It was confirmed with [facility] liaison that they could service that area. [facility] liaison to connect with the daughter to prepare for home.   Expected Discharge Plan: Home w Hospice Care Barriers to Discharge: Continued Medical Work up  Expected Discharge Plan and Services Expected Discharge Plan: [place]   Discharge Planning Services: CM Consult   Living arrangements for the past 2 months: Single Family Home                           HH Arranged: Disease Management [place] Agency: Hospice and [place] Date [facility] Agency Contacted: 03/03/21 Time HH Agency Contacted: 1200 Representative spoke with at [place]: [place] ([place]) Interventions    Readmission Risk Interventions Readmission Risk Prevention Plan 02/28/2021  Transportation Screening Complete  PCP or Specialist Appt within 3-5 Days Complete  HRI or [place] Complete  Social Work Consult for [place] Planning/Counseling Complete  Palliative Care Screening Complete  Medication Review Press photographer) Complete  Some recent data might be hidden

## 2021-03-03 NOTE — Progress Notes (Signed)
   Referral received from Alinda Sierras in [place] with transitions of care dept. The pt has opted to go home with her daughter in [place] and now would like to purse hospice services. I have spoke to the daughter regarding the hospice philosophy and goals of care. She will be discussing with the pt tomorrow what equipment is needed in home and we will work on getting this in place. I will follow up with our pharmacy in am to see if Fent. Is an option there has been a shortage recently and update Dr. Hilma Favors and CM team.  Daughter is in agreement with proceeding with hospice services at d/c with hospice of [facility].  Webb Silversmith RN [phone]

## 2021-03-03 NOTE — Telephone Encounter (Signed)
Yes.  I will be happy to do that

## 2021-03-03 NOTE — Progress Notes (Signed)
PROGRESS NOTE    MARRIETTA THUNDER  KPV:[id number] DOB: 1956/01/07 DOA: 02/24/2021 PCP: Midge Minium, MD    Chief Complaint  Patient presents with  . Abdominal Pain    Brief Narrative 65 year old female with past medical history of pancreatic cancer with biliary stent on gemcitabine/Abraxane(last received gemcitabine on 3/18 and plan for chemo on 3/31), COPD, chronic cancer-related painon chronic opiates, hypertension, hyperlipidemia, MI and cardiogenic shock, collagenous colitis who presented to the ED with emesis and abdominal pain unrelieved by home analgesia. Patient placed on a Dilaudid PCA pump.  Palliative care consulted to help with pain management.  Oncology following.   Assessment & Plan:   Principal Problem:   Cancer related pain Active Problems:   Hyperlipidemia   Essential hypertension   Coronary artery disease involving native coronary artery of native heart without angina pectoris   Pancreatic cancer (HCC)   Hypokalemia due to excessive gastrointestinal loss of potassium   Hypomagnesemia   Constipation   Chronic obstructive pulmonary disease (HCC)   Palliative care by specialist  1 acute on chronic pancreatic cancer related abdominal pain with nausea and vomiting -CT abdomen and pelvis done with concern for progression of pancreatic cancer and nodal metastatic disease with small volume ascites and anasarca. -Patient noted at home to be on MS Contin 100 mg every 8 hours, oxycodone 30 mg every 4 hours as needed which was taking regularly with no significant relief. -Was on IV Dilaudid PCA pump which was discontinued per oncology early on in the hospitalization.  -Palliative care consulted and are following and adjusting and titrating pain medications. -IV scheduled Zofran has been changed to every 6 hours as needed.   -Continue Decadron for nausea per oncology..   -Patient was restarted back on home regimen of MS Contin 100 mg every 12 hours per oncology.   Oxycodone as needed breakthrough pain, IV Dilaudid as needed. -Patient pain currently NOT well controlled.   -Patient was on MS Contin 100 mg twice daily required 150 mg of p.o. OxyIR over the past 24 hours, also 10 mg IV Dilaudid used over the past 24 hours. -Palliative care following and pain regimen being adjusted -Patient started on Dilaudid PCA pump, fentanyl patch and Lidoderm patch added for better pain control.  -Patient however still with ongoing pain and palliative care following and patient being initiated on methadone 10 mg 3 times daily, fentanyl PCA, low-dose Zyprexa, scheduled Zofran with meals, simethicone, Decadron been increased to 8 mg daily.  -Continue current soft/regular diet. -Palliative care managing pain medications.  2.  Constipation Patient with complaints of constipation early on in the hospitalization which has since resolved on current bowel regimen. -Patient on chronic pain medications.  -Continue current bowel regimen of MiraLAX twice daily, Senokot-S twice daily, Dulcolax daily.  -Mineral oil enema as needed.   3.  Hypokalemia -Secondary to GI losses.   -Potassium currently at 3.9.    4.  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 -Patient being followed by oncology who recommended hospice care if patient's performance status has not improved over the next week. -Wa on MS Contin twice daily oxycodone IR as needed breakthrough pain, IV Dilaudid as needed.. -Dilaudid PCA discontinued per oncology initially and patient was placed on home regimen oxycodone as needed breakthrough pain. -Pain not currently controlled. -Palliative care following and titrating pain medication for better control. -Patient started on Dilaudid PCA pump  per palliative care 03/02/2021 for better pain control as well as a fentanyl patch.   -Lidoderm patch ordered yesterday.    -Warm compresses to lower back for  -Palliative care following and pain regimen being changed to methadone 10 mg 3 times daily, fentanyl PCA, low-dose Zyprexa, scheduled Zofran with meals, simethicone, Decadron been increased to 8 mg daily.   -Oncology following and currently recommending home hospice care.  -Once patient's pain is adequately controlled could likely be discharged home with hospice.    5.  Hypomagnesemia -Magnesium at 2.0.   -Follow.   6.  Rash of unclear etiology -Stable.   -Outpatient follow-up.  7.  COPD -DuoNebs as needed.   8.  Hypertension -Blood pressure noted to be elevated likely component of pain.   -Not on antihypertensive medications prior to admission.   -Follow.    9.  Hyperlipidemia/CAD/history of STEMI -Stable.   -Continue aspirin.   -Hold statin and likely resume on discharge.   -Supportive care.     DVT prophylaxis: Lovenox Code Status: DNR Family Communication: Updated patient and husband at bedside. Disposition:   Status is: Inpatient    Dispo: The patient is from: Home              Anticipated d/c is to: Likely home with hospice.              Patient currently with pain management underway, pain not currently controlled, and pain medication being titrated per palliative care.  Not stable for discharge.    Difficult to place patient no       Consultants:   Palliative care: Dr. Rowe Pavy 02/26/2021  Oncology: Dr. Benay Spice 02/25/2021  Procedures:  CT abdomen and pelvis 02/24/2021  Acute abdominal series 02/24/2021    Antimicrobials:   None   Subjective: Sitting up on the side of the bed eating breakfast.   Complaining of significant lower back pain and lower abdominal pain that has been ongoing over the past year and a half which is currently worse and pain currently not fully controlled on current PCA pump.   No chest pain.  No shortness of breath.  Having bowel movements.  Tolerating current diet.     Objective: Vitals:   03/03/21 0356 03/03/21 0530 03/03/21 0739 03/03/21 0954  BP:  123/71  138/78  Pulse:  64  72  Resp: 18 17 16 16   Temp:  97.7 F (36.5 C)  97.9 F (36.6 C)  TempSrc:  Oral  Oral  SpO2: 99% 98% 99% 100%  Weight:      Height:        Intake/Output Summary (Last 24 hours) at 03/03/2021 1004 Last data filed at 03/02/2021 1300 Gross per 24 hour  Intake 240 ml  Output --  Net 240 ml   Filed Weights   02/24/21 1115  Weight: 54.4 kg    Examination:  General exam: : NAD Respiratory system: CTA B anterior lung fields.  No wheezes, no rhonchi.  Speaking in full sentences.  Normal respiratory effort. Cardiovascular system: Regular rate and rhythm no murmurs rubs or gallops.  No JVD.  No lower extremity edema.  Gastrointestinal system: Abdomen soft, nontender, nondistended, positive bowel sounds.  No rebound.  No guarding. Central nervous system: Alert and oriented. No focal neurological deficits. Extremities: Symmetric 5 x 5 power. Skin: No rashes, lesions or ulcers Psychiatry: Judgement and insight appear normal. Mood & affect appropriate.  Data Reviewed: I have personally reviewed following labs and imaging studies  CBC:  Recent Labs  Lab 02/24/21 1139 02/25/21 1032 02/26/21 0526 02/27/21 0525 02/28/21 0603 03/01/21 0425 03/03/21 0549  WBC 4.0   < > 3.5* 5.4 6.7 6.8 5.0  NEUTROABS 3.2  --   --   --   --   --   --   HGB 10.3*   < > 11.0* 11.3* 10.8* 9.9* 10.1*  HCT 30.9*   < > 33.1* 33.6* 32.8* 29.9* 31.3*  MCV 91.4   < > 91.2 91.3 92.7 93.7 94.8  PLT 226   < > 266 288 283 275 333   < > = values in this interval not displayed.    Basic Metabolic Panel: Recent Labs  Lab 02/25/21 1032 02/26/21 0526 02/27/21 0525 02/28/21 0603 03/01/21 0425 03/02/21 0541 03/03/21 0549  NA 133* 135 135 135 136 137 135  K 2.6* 3.4* 3.7 3.6 3.7 3.6 3.9  CL 95* 96* 98 100 102 102 102  CO2 28 26 24 27 27 29 27   GLUCOSE 99 85 81 110* 118* 92 93  BUN 6* 13 17  11 8  6* 7*  CREATININE 0.50 0.56 0.60 0.53 0.51 0.37* 0.36*  CALCIUM 8.0* 8.5* 8.4* 8.5* 8.4* 8.1* 8.4*  MG 2.0 2.0  --   --  1.6* 2.1 2.0    GFR: Estimated Creatinine Clearance: 59.4 mL/min (A) (by C-G formula based on SCr of 0.36 mg/dL (L)).  Liver Function Tests: Recent Labs  Lab 02/24/21 1139 02/25/21 1032 02/26/21 0526 02/27/21 0525 02/28/21 0603  AST 28 22 20 17 21   ALT 22 20 20 19 18   ALKPHOS 102 93 97 79 79  BILITOT 0.6 0.7 1.1 1.1 0.8  PROT 6.1* 5.8* 6.2* 6.0* 5.7*  ALBUMIN 3.4* 3.1* 3.4* 3.2* 3.1*    CBG: No results for input(s): GLUCAP in the last 168 hours.   Recent Results (from the past 240 hour(s))  SARS CORONAVIRUS 2 (TAT 6-24 HRS) Nasopharyngeal Nasopharyngeal Swab     Status: None   Collection Time: 02/24/21  3:04 PM   Specimen: Nasopharyngeal Swab  Result Value Ref Range Status   SARS Coronavirus 2 NEGATIVE NEGATIVE Final    Comment: (NOTE) SARS-CoV-2 target nucleic acids are NOT DETECTED.  The SARS-CoV-2 RNA is generally detectable in upper and lower respiratory specimens during the acute phase of infection. Negative results do not preclude SARS-CoV-2 infection, do not rule out co-infections with other pathogens, and should not be used as the sole basis for treatment or other patient management decisions. Negative results must be combined with clinical observations, patient history, and epidemiological information. The expected result is Negative.  Fact Sheet for Patients: SugarRoll.be  Fact Sheet for Healthcare Providers: https://www.woods-mathews.com/  This test is not yet approved or cleared by the [place] FDA and  has been authorized for detection and/or diagnosis of SARS-CoV-2 by FDA under an Emergency Use Authorization (EUA). This EUA will remain  in effect (meaning this test can be used) for the duration of the COVID-19 declaration under Se ction 564(b)(1) of the Act, 21 U.S.C. section  360bbb-3(b)(1), unless the authorization is terminated or revoked sooner.  Performed at [facility], [place] [address] [place], [place] [address]          Radiology Studies: No results found.      Scheduled Meds: . acetaminophen  500 mg Oral BID  . ALPRAZolam  0.5 mg Oral BID  . aspirin EC  81 mg Oral QHS  . bisacodyl  5 mg Oral Daily  .  dexamethasone  4 mg Oral Daily  . enoxaparin (LOVENOX) injection  40 mg Subcutaneous Q24H  . fentaNYL  1 patch Transdermal Q72H  . HYDROmorphone   Intravenous Q4H  . lidocaine  1 patch Transdermal Q24H  . loratadine  10 mg Oral Daily  . magic mouthwash  15 mL Oral QID  . pantoprazole  40 mg Oral BID  . polyethylene glycol  17 g Oral BID  . senna-docusate  1 tablet Oral BID   Continuous Infusions:    LOS: 7 days    Time spent: 35 minutes    Irine Seal, MD [facility]   To contact the attending provider between 7A-7P or the covering provider during after hours 7P-7A, please log into the web site [email or web address] and access using universal [place] password for that web site. If you do not have the password, please call the hospital operator.  03/03/2021, 10:04 AM

## 2021-03-03 NOTE — Progress Notes (Addendum)
HEMATOLOGY-ONCOLOGY PROGRESS NOTE  SUBJECTIVE: The patient developed significant increase in her back pain over the weekend.  She has been restarted on her Dilaudid PCA.  She is also on a fentanyl patch.  For some mild nausea this morning but no vomiting.  She took oral Zofran which was effective.  Husband and daughter are at the bedside.  Oncology History  Pancreatic cancer ([place])  03/20/2020 Initial Diagnosis   Pancreatic cancer ([place])   03/28/2020 - 11/27/2020 Chemotherapy         03/31/2020 Genetic Testing   Negative genetic testing on the common hereditary cancer panel and pancreatitis cancer panel.  The custom Hereditary Gene Panel offered by [facility] includes sequencing and/or deletion duplication testing of the following 56 genes: APC*, ATM*, AXIN2, BARD1, BMPR1A, BRCA1, BRCA2, BRIP1, CASR, CDH1, CDK4, CDKN2A (p14ARF), CDKN2A (p16INK4a), CFTR*, CHEK2, CPA1, CTNNA1, CTRC, DICER1*, EPCAM*, FANCC, GREM1*, HOXB13, KIT, MEN1*, MLH1*, MSH2*, MSH3*, MSH6*, MUTYH, NBN, NF1*, NTHL1, PALB2, PALLD, PDGFRA, PMS2*, POLD1*, POLE, PRSS1*, PTEN*, RAD50, RAD51C, RAD51D, RNF43, SDHA*, SDHB, SDHC*, SDHD, SMAD4, SMARCA4, SPINK1, STK11, TP53, TSC1*, TSC2, and VHL The report date is 03/31/2020.   01/08/2021 -  Chemotherapy    Patient is on Treatment Plan: PANCREATIC ABRAXANE / GEMCITABINE D1,8,15 Q28D       PHYSICAL EXAMINATION:  Vitals:   03/03/21 0530 03/03/21 0739  BP: 123/71   Pulse: 64   Resp: 17 16  Temp: 97.7 F (36.5 C)   SpO2: 98% 99%   Filed Weights   02/24/21 1115  Weight: 54.4 kg    Intake/Output from previous day: 04/03 0701 - 04/04 0700 In: 480 [P.O.:480] Out: -   GENERAL: Chronically ill-appearing.  No acute distress. SKIN: Erythematous rash noted to back, legs, chest, less prominent OROPHARYNX: No thrush or ulcers. LUNGS: Clear bilaterally HEART: regular rate & rhythm  ABDOMEN: Positive bowel sounds, soft, mild generalized tenderness. NEURO: alert & oriented x 3 with fluent  speech, no focal motor/sensory deficits  LABORATORY DATA:  I have reviewed the data as listed CMP Latest Ref Rng & Units 03/03/2021 03/02/2021 03/01/2021  Glucose 70 - 99 mg/dL 93 92 118(H)  BUN 8 - 23 mg/dL 7(L) 6(L) 8  Creatinine 0.44 - 1.00 mg/dL 0.36(L) 0.37(L) 0.51  Sodium 135 - 145 mmol/L 135 137 136  Potassium 3.5 - 5.1 mmol/L 3.9 3.6 3.7  Chloride 98 - 111 mmol/L 102 102 102  CO2 22 - 32 mmol/L '27 29 27  ' Calcium 8.9 - 10.3 mg/dL 8.4(L) 8.1(L) 8.4(L)  Total Protein 6.5 - 8.1 g/dL - - -  Total Bilirubin 0.3 - 1.2 mg/dL - - -  Alkaline Phos 38 - 126 U/L - - -  AST 15 - 41 U/L - - -  ALT 0 - 44 U/L - - -    Lab Results  Component Value Date   WBC 5.0 03/03/2021   HGB 10.1 (L) 03/03/2021   HCT 31.3 (L) 03/03/2021   MCV 94.8 03/03/2021   PLT 333 03/03/2021   NEUTROABS 3.2 02/24/2021    CT Abdomen Pelvis Wo Contrast  Result Date: 02/24/2021 CLINICAL DATA:  Abdominal distension, back and abdominal pain, pancreatic cancer EXAM: CT ABDOMEN AND PELVIS WITHOUT CONTRAST TECHNIQUE: Multidetector CT imaging of the abdomen and pelvis was performed following the standard protocol without IV contrast. COMPARISON:  11/14/2020 FINDINGS: Lower chest: No acute abnormality. Hepatobiliary: No solid liver abnormality is seen. No gallstones or gallbladder wall thickening. Redemonstrated common bile duct stent with post stenting pneumobilia. Pancreas: Although ill-defined and  difficult to clearly evaluate, particularly by noncontrast examination, pancreatic head mass appears significantly enlarged, overall dimensions of the pancreatic head approximately 4.4 x 4.3 cm, previously 3.1 x 2.9 cm when measured similarly (series 2, image 25). Spleen: Normal in size without significant abnormality. Adrenals/Urinary Tract: Adrenal glands are unremarkable. Kidneys are normal, without renal calculi, solid lesion, or hydronephrosis. Bladder is unremarkable. Stomach/Bowel: Stomach is within normal limits. Appendix appears  normal. No evidence of bowel wall thickening, distention, or inflammatory changes. Vascular/Lymphatic: Aortic atherosclerosis. Although indistinct and difficult to precisely measure, enlarged, matted left periaortic lymph nodes appear increased in size compared to prior examination, measuring approximately 3.5 x 2.7 cm, previously 2.4 x 2.1 cm when measured similarly (series 2, image 23). Reproductive: No mass or other significant abnormality. Other: No abdominal wall hernia or abnormality. Anasarca. Small volume ascites. Musculoskeletal: No acute or significant osseous findings. IMPRESSION: 1. Although ill-defined and difficult to clearly evaluate, particularly by noncontrast examination, pancreatic head mass appears significantly enlarged compared to prior examination, consistent with worsened primary pancreatic adenocarcinoma. 2. Although indistinct and difficult to precisely measure, enlarged, matted left periaortic lymph nodes appear increased in size compared to prior examination, concerning for worsened nodal metastatic disease. 3. Redemonstrated common bile duct stent with post stenting pneumobilia. 4. Small volume ascites and anasarca. Aortic Atherosclerosis (ICD10-I70.0). Electronically Signed   By: Eddie Candle M.D.   On: 02/24/2021 13:46   DG ABD ACUTE 2+V W 1V CHEST  Result Date: 02/24/2021 CLINICAL DATA:  Worsening abdominal pain with nausea and vomiting. History of pancreatic cancer. EXAM: DG ABDOMEN ACUTE WITH 1 VIEW CHEST COMPARISON:  Chest radiographs 01/31/2021.  Abdominal CT 11/14/2020. FINDINGS: Frontal chest radiograph demonstrates mild patient rotation to the right. Right IJ Port-A-Cath appears unchanged at the level of the superior cavoatrial junction. The heart size and mediastinal contours are stable allowing for the rotation. The lungs are clear. There is no pleural effusion or pneumothorax. Metallic biliary stent appears unchanged in position. Associated expected pneumobilia appears  less evident. The bowel gas pattern is normal. There is no free intraperitoneal air. There is stable mild degenerative changes in the spine. IMPRESSION: 1. No acute cardiopulmonary or abdominal process identified. 2. Stable position of the biliary stent. Pneumobilia not well visualized; if concern of biliary obstruction, consider further evaluation with follow-up CT. Electronically Signed   By: Richardean Sale M.D.   On: 02/24/2021 12:12    ASSESSMENT AND PLAN: 1. 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 MRI abdomen 02/01/2020-2.2 x 3.3 cm mass in the posterior pancreas head/uncinate process, mild intrahepatic/extrahepatic ductal dilatation, no evidence of vascular invasion, possible small lymph nodes in the porta hepatis-poorly visualized ? EUS 02/29/2020-20 x 23 mm mass in the pancreas head, upstream pancreatic duct dilatation, 1 abnormal peripancreatic node, uT3?uN1 ? 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 CTs 03/19/2020-2.9 x 2.1 x 3.1 cm pancreas head/uncinate mass, lesion associated with infrarenal abdominal aorta and superior mesenteric artery, prominent and borderline enlarged retroperitoneal nodes including a left periaortic node, indeterminate 4 mm lung nodule ? Cycle 1 mFOLFIRINOX 8/41/66, complicated by n/v and hospital admission 5/2/ - 5/3 ? Second opinion at [facility] with Dr. Earnestine Mealing and Dr. Mariah Milling 04/02/20 ? PET scan 04/03/2020-hypermetabolic poorly marginated pancreatic head mass; 2 hypermetabolic left periaortic lymph nodes, hypermetabolic left supraclavicular lymph node ? Cycle 2 FOLFIRINOX 04/11/2020 ? Ultrasound-guided biopsy of a left supraclavicular mass on 04/18/2020-poorly differentiated adenocarcinoma, cytokeratin 7+, cytokeratin 20 and CDX2 positive in rare  cells. ? Cycle 3 FOLFIRINOX 04/24/2020-oxaliplatin infusion lengthened to 3 hours, Udenyca was not given (ordered) ? Cycle 4 FOLFIRINOX 05/08/2020 ? Cycle 5  FOLFIRINOX 05/22/2020-atropine and Ativan premedication added secondary to cholinergic symptoms and nausea from irinotecan ? CTs 05/29/2020-decreased left supraclavicular lymph node, slight decrease in size of the pancreas head mass, decrease in retroperitoneal lymphadenopathy ? Cycle 6 FOLFIRINOX 06/05/2020 ? Cycle 7 FOLFIRINOX 06/26/2020 ? Cycle 8 FOLFIRINOX 07/11/2020 (oxaliplatin held secondary to neuropathy) ? Cycle 9 FOLFIRINOX 07/24/2020 (oxaliplatin held secondary to neuropathy) ? Cycle 10 FOLFIRINOX 08/07/2020 (oxaliplatin held secondary to neuropathy) ? CTs 08/19/2020-subtle 5 mm peripheral right liver lesion, pancreas mass unchanged and difficult to discretely delineate, unchanged left retroperitoneal node, no evidence of disease progression, onDr. Karley Pho'sreview of the CTs there is no change in the previously enlarged left supraclavicular node ? Cycle 11 FOLFIRINOX 09/18/2020 (oxaliplatin held secondary to neuropathy) ? Cycle 12 FOLFIRINOX 10/08/2020 (oxaliplatin held secondary to neuropathy) ? Cycle 13 FOLFIRINOX 10/30/2020 (oxaliplatin held secondary to neuropathy) ? CTs 11/14/2020-no evidence of thoracic metastases, slight enlargement of the pancreas head mass, increase in size of periaortic lymph node ? Cycle 14 FOLFIRINOX 11/25/2020 (oxaliplatin held secondary to neuropathy) ? Cycle 1 gemcitabine/Abraxane 01/08/2021 ? Cycle 2 gemcitabine/Abraxane 01/30/2021 ? Cycle 3 gemcitabine 02/14/2021, Abraxane held due to progressive neuropathy  2. Abdomen/back pain secondary to #1-progressive January 2022 3. Anorexia/weight loss secondary to #1 4. History of coronary artery disease,STEMI with the fib arrest 2011, status post RCA stent 5. Constipation, secondary to #1 and morphine 6. 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 Metal stent placed into the common bile duct 09/06/2020. 9.Hoarsenessstatus post vocal cord injections2/24/2022  Ms. Detzel developed significant worsening of her back pain over the weekend.  Pain due to underlying malignancy.  She developed significant worsening of her back pain on oral pain medication and is now back on Dilaudid PCA.  She is also on a fentanyl patch.  Pain is not adequately controlled at this time.  Palliative care is assisting with pain management.  Discussed goals of care with the patient and her family today.  They are aware that she will likely have to go home on Dilaudid PCA to control her pain.  Given her continued decline in performance status, she is not a candidate for additional chemotherapy.  Recommend hospice.  Hospice services were discussed in detail with the patient and her family and they are interested in pursuing hospice.  TOC consult has been placed.  Recommendations: 1.  Narcotics per the palliative care service 2.  Continue dexamethasone 4 mg daily for nausea and as needed Zofran and Phenergan. 3.  Continue current bowel regimen. 4.  TOC consult has been placed for hospice services.   LOS: 7 days   Mikey Bussing,  AGPCNP-BC 03/03/21 Ms. Lichtenberger was interviewed and examined.  She had increased pain over the weekend.  The Dilaudid PCA was resumed.  She is now maintained on methadone and a fentanyl PCA.  She understands the increased pain and recent CT likely indicate disease progression.  I feel she will benefit from home hospice care.  She is in agreement with a referral for home hospice care.  I will plan to serve as the primary provider with the home hospice team.  I  was present for greater than 50% of today's visit.  I performed medical decision making.  Julieanne Manson, MD

## 2021-03-03 NOTE — Progress Notes (Signed)
Palliative Care Progress Note  Mrs. Oldaker continues to struggle with refractory cancer pain despite many attempts throughout her hospitalization to get her cancer related pain under better control. She had a major setback this weekend after discontinuing her PCA, and reports today that she woke up this AM in severe pain and she finally has relief after 10 mg of hydromorphone IV via PCA in the last 4 hours, a fentanyl patch was started at 57mg yesterday and her PCA was restarted with hydromorphone. Previously she was on MSContin 1042mTID and Oxycodone3037mvery 4 hours as needed.  On exam she is showing signs of opioid toxicity-muscle twitching and myclonic jerks -she is also having periods of mental confusion and short term memory loss- but still communicates well. I also met with her daughter and her husband-they are extremely supportive and helpful-also concerned and want to make sure she gets the very best care possible.  Summary:  1. Pain is largely unresponsive to very high opioid dosing-signs of toxicity.   Discontinue all current opioids  Will initiate Methadone 8m39mD and titrate based on pain levels.  Will Start a Fentanyl PCA (her hydromorphone doses are in a toxicity range) Will start at 20mc69m0 prn until methadone dosine reaches steady state.   Request no changes be made to this regimen without first consulting with palliative care.   2. Nausea-will start low dose xyprexa 2.5mg Q57m schedule zofran TID with meals. Need to take scheduled doses consistently. Simethicone for bloating.  3. Her 12-lead EKG is normal-no QT prolongation.  4. Increase her current dose of decadron will increased to 8mg da36m.  5. Her goals of care have changed-she wants to focus entirely on symptom control and quality of life-she wants to get home to be with her family as soon as possible. She is agreeable to hospice services. She will have very complex pain needs at home including a PCA and  methadone titration. She will go [place] in case there is a need for urgent [facility] level care- transporting her to [place] event of pain crisis would probably not be the best plan in her situation. Have asked referral be made to [facility] at families request after considering this possibility.  6. Provided spiritual support today-she has a strong Christian faith- will place chaplain consult.  [facility] [phone] 35 min ([phone] Goals of care: 12:35-105PM. Greater than 50%  of this time was spent counseling and coordinating care related to the above assessment and plan.

## 2021-03-03 NOTE — Care Management Important Message (Signed)
Important Message  Patient Details IM Letter given to the Patient. Name: Vicki [name] MRN: [id number] Date of Birth: 1956-06-29   Medicare Important Message Given:  Yes     Kerin Salen 03/03/2021, 10:41 AM

## 2021-03-03 NOTE — Telephone Encounter (Signed)
Vicki [name] from [facility] called in asking if Dr. Birdie Riddle would be the attending physician and be willing to sign off on orders, and pt's death cert. When that time comes.  Please advise

## 2021-03-03 NOTE — Telephone Encounter (Signed)
Vicki [name] from [facility] hospice to let her know that Dr. Birdie Riddle would be Vicki [name] attending physician.

## 2021-03-04 ENCOUNTER — Telehealth: Payer: Self-pay | Admitting: Oncology

## 2021-03-04 DIAGNOSIS — E876 Hypokalemia: Secondary | ICD-10-CM | POA: Diagnosis not present

## 2021-03-04 DIAGNOSIS — R112 Nausea with vomiting, unspecified: Secondary | ICD-10-CM | POA: Diagnosis not present

## 2021-03-04 DIAGNOSIS — G893 Neoplasm related pain (acute) (chronic): Secondary | ICD-10-CM | POA: Diagnosis not present

## 2021-03-04 DIAGNOSIS — J449 Chronic obstructive pulmonary disease, unspecified: Secondary | ICD-10-CM | POA: Diagnosis not present

## 2021-03-04 LAB — BASIC METABOLIC PANEL
Anion gap: 6 (ref 5–15)
BUN: 9 mg/dL (ref 8–23)
CO2: 27 mmol/L (ref 22–32)
Calcium: 8.4 mg/dL — ABNORMAL LOW (ref 8.9–10.3)
Chloride: 105 mmol/L (ref 98–111)
Creatinine, Ser: 0.31 mg/dL — ABNORMAL LOW (ref 0.44–1.00)
GFR, Estimated: >60 mL/min (ref >60)
Glucose, Bld: 90 mg/dL (ref 70–99)
Potassium: 3.6 mmol/L (ref 3.5–5.1)
Sodium: 138 mmol/L (ref 135–145)

## 2021-03-04 LAB — MAGNESIUM: Magnesium: 1.9 mg/dL (ref 1.7–2.4)

## 2021-03-04 MED ORDER — ALPRAZOLAM 0.5 MG PO TABS
0.5000 mg | ORAL_TABLET | Freq: Two times a day (BID) | ORAL | Status: DC | PRN
  Administered 2021-03-04 – 2021-03-05 (×2): 0.5 mg via ORAL
  Filled 2021-03-04 (×2): qty 1

## 2021-03-04 NOTE — Progress Notes (Signed)
PROGRESS NOTE    Vicki [name]  PXT:[id number] DOB: 1956-07-25 DOA: 02/24/2021 PCP: Midge Minium, MD    Chief Complaint  Patient presents with  . Abdominal Pain    Brief Narrative 65 year old female with past medical history of pancreatic cancer with biliary stent on gemcitabine/Abraxane(last received gemcitabine on 3/18 and plan for chemo on 3/31), COPD, chronic cancer-related painon chronic opiates, hypertension, hyperlipidemia, MI and cardiogenic shock, collagenous colitis who presented to the ED with emesis and abdominal pain unrelieved by home analgesia. Patient placed on a Dilaudid PCA pump.  Palliative care consulted to help with pain management.  Oncology following.   Assessment & Plan:   Principal Problem:   Cancer related pain Active Problems:   Hyperlipidemia   Essential hypertension   Coronary artery disease involving native coronary artery of native heart without angina pectoris   Pancreatic cancer (HCC)   Hypokalemia due to excessive gastrointestinal loss of potassium   Hypomagnesemia   Constipation   Chronic obstructive pulmonary disease (HCC)   Palliative care by specialist  1 acute on chronic pancreatic cancer related abdominal pain with nausea and vomiting -CT abdomen and pelvis done with concern for progression of pancreatic cancer and nodal metastatic disease with small volume ascites and anasarca. -Patient noted at home to be on MS Contin 100 mg every 8 hours, oxycodone 30 mg every 4 hours as needed which was taking regularly with no significant relief. -Was on IV Dilaudid PCA pump which was discontinued per oncology early on in the hospitalization.  -Palliative care consulted and are following and adjusting and titrating pain medications. -IV scheduled Zofran has been changed to every 6 hours as needed.   -Continue Decadron for nausea per oncology..   -Patient was restarted back on home regimen of MS Contin 100 mg every 12 hours per oncology.   Oxycodone as needed breakthrough pain, IV Dilaudid as needed. -Patient pain currently NOT well controlled.   -Patient was on MS Contin 100 mg twice daily required 150 mg of p.o. OxyIR over the past 24 hours, also 10 mg IV Dilaudid used over the past 24 hours. -Palliative care following and pain regimen being adjusted -Patient started on Dilaudid PCA pump, fentanyl patch and Lidoderm patch added for better pain control.  -Patient however still with ongoing pain and palliative care following and patient being initiated on methadone 10 mg 3 times daily, fentanyl PCA, low-dose Zyprexa, scheduled Zofran with meals, simethicone, Decadron been increased to 8 mg daily.  -Continue current soft/regular diet. -Palliative care managing pain medications.  2.  Constipation Patient with complaints of constipation early on in the hospitalization which has since resolved on current bowel regimen. -Patient on chronic pain medications.  -Continue current bowel regimen of MiraLAX twice daily, Senokot-S twice daily, Dulcolax daily.  -Mineral oil enema as needed.   3.  Hypokalemia -Potassium at 3.6.   -Follow.    4.  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 -Patient being followed by oncology who recommended hospice care if patient's performance status has not improved over the next week. -Wa on MS Contin twice daily oxycodone IR as needed breakthrough pain, IV Dilaudid as needed.. -Dilaudid PCA discontinued per oncology initially and patient was placed on home regimen oxycodone as needed breakthrough pain. -Pain not currently controlled. -Palliative care following and titrating pain medication for better control. -Patient started on Dilaudid PCA pump per palliative care 03/02/2021  for better pain control as well as a fentanyl patch.   -Lidoderm patch ordered for 02/16/2021.   -Warm compresses to lower  back for  -Palliative care following and pain regimen being changed to methadone 10 mg 3 times daily, fentanyl PCA, low-dose Zyprexa, scheduled Zofran with meals, simethicone, Decadron been increased to 8 mg daily.   -Oncology following and currently recommending home hospice care.  -Once patient's pain is adequately controlled could likely be discharged home with hospice.    5.  Hypomagnesemia -Magnesium at 1.9  -Follow.   6.  Rash of unclear etiology -Stable.   -Outpatient follow-up.  7.  COPD -Continue duo nebs as needed.   8.  Hypertension -Blood pressure noted to be elevated likely component of pain.   -Not on antihypertensive medications prior to admission.   -Follow.    9.  Hyperlipidemia/CAD/history of STEMI -Currently stable.   -Continue aspirin.   -Continue to hold statin and resume on discharge.   -Supportive care.     DVT prophylaxis: Lovenox Code Status: DNR Family Communication: Updated patient and husband at bedside. Disposition:   Status is: Inpatient    Dispo: The patient is from: Home              Anticipated d/c is to: Likely home with hospice.              Patient currently with pain management underway, pain medications being titrated. Not stable for discharge.    Difficult to place patient no       Consultants:   Palliative care: Dr. Rowe Pavy 02/26/2021  Oncology: Dr. Benay Spice 02/25/2021  Palliative care: Dr. Hilma Favors 03/03/2021  Procedures:  CT abdomen and pelvis 02/24/2021  Acute abdominal series 02/24/2021    Antimicrobials:   None   Subjective: Patient sitting up on the side of the bed.  Stated she felt out of sorts this morning.  Feeling a little bit groggy.   Pain is significantly better controlled per patient.  No chest pain, no shortness of breath, no nausea or vomiting.  Tolerating oral intake.   States pain is controlled to the point where she does not feel the need to push the PCA pump.   Objective: Vitals:   03/03/21  1528 03/03/21 1758 03/03/21 2158 03/04/21 0523  BP:  126/80 118/78 (!) 142/78  Pulse:  86 75 (!) 58  Resp: 16 16 16 16   Temp:  98.1 F (36.7 C) 98.3 F (36.8 C) (!) 97.5 F (36.4 C)  TempSrc:  Oral Oral Oral  SpO2:  100% 94% 95%  Weight:      Height:        Intake/Output Summary (Last 24 hours) at 03/04/2021 1030 Last data filed at 03/04/2021 0313 Gross per 24 hour  Intake 361 ml  Output --  Net 361 ml   Filed Weights   02/24/21 1115  Weight: 54.4 kg    Examination:  General exam: : NAD Respiratory system: CTA B anterior lung fields.  No wheezes, no rhonchi.  Speaking in full sentences.  Normal respiratory effort. Cardiovascular system: Regular rate and rhythm no murmurs rubs or gallops.  No JVD.  No lower extremity edema.  Gastrointestinal system: Abdomen soft, nontender, nondistended, positive bowel sounds.  No rebound.  No guarding. Central nervous system: Alert and oriented. No focal neurological deficits. Extremities: Symmetric 5 x 5 power. Skin: No rashes, lesions or ulcers Psychiatry: Judgement and insight appear normal. Mood & affect appropriate. Data Reviewed: I have personally reviewed following  labs and imaging studies  CBC: Recent Labs  Lab 02/26/21 0526 02/27/21 0525 02/28/21 0603 03/01/21 0425 03/03/21 0549  WBC 3.5* 5.4 6.7 6.8 5.0  HGB 11.0* 11.3* 10.8* 9.9* 10.1*  HCT 33.1* 33.6* 32.8* 29.9* 31.3*  MCV 91.2 91.3 92.7 93.7 94.8  PLT 266 288 283 275 010    Basic Metabolic Panel: Recent Labs  Lab 02/26/21 0526 02/27/21 0525 02/28/21 0603 03/01/21 0425 03/02/21 0541 03/03/21 0549 03/04/21 0532  NA 135   < > 135 136 137 135 138  K 3.4*   < > 3.6 3.7 3.6 3.9 3.6  CL 96*   < > 100 102 102 102 105  CO2 26   < > 27 27 29 27 27   GLUCOSE 85   < > 110* 118* 92 93 90  BUN 13   < > 11 8 6* 7* 9  CREATININE 0.56   < > 0.53 0.51 0.37* 0.36* 0.31*  CALCIUM 8.5*   < > 8.5* 8.4* 8.1* 8.4* 8.4*  MG 2.0  --   --  1.6* 2.1 2.0 1.9   < > = values in this  interval not displayed.    GFR: Estimated Creatinine Clearance: 59.4 mL/min (A) (by C-G formula based on SCr of 0.31 mg/dL (L)).  Liver Function Tests: Recent Labs  Lab 02/25/21 1032 02/26/21 0526 02/27/21 0525 02/28/21 0603  AST 22 20 17 21   ALT 20 20 19 18   ALKPHOS 93 97 79 79  BILITOT 0.7 1.1 1.1 0.8  PROT 5.8* 6.2* 6.0* 5.7*  ALBUMIN 3.1* 3.4* 3.2* 3.1*    CBG: No results for input(s): GLUCAP in the last 168 hours.   Recent Results (from the past 240 hour(s))  SARS CORONAVIRUS 2 (TAT 6-24 HRS) Nasopharyngeal Nasopharyngeal Swab     Status: None   Collection Time: 02/24/21  3:04 PM   Specimen: Nasopharyngeal Swab  Result Value Ref Range Status   SARS Coronavirus 2 NEGATIVE NEGATIVE Final    Comment: (NOTE) SARS-CoV-2 target nucleic acids are NOT DETECTED.  The SARS-CoV-2 RNA is generally detectable in upper and lower respiratory specimens during the acute phase of infection. Negative results do not preclude SARS-CoV-2 infection, do not rule out co-infections with other pathogens, and should not be used as the sole basis for treatment or other patient management decisions. Negative results must be combined with clinical observations, patient history, and epidemiological information. The expected result is Negative.  Fact Sheet for Patients: SugarRoll.be  Fact Sheet for Healthcare Providers: https://www.woods-mathews.com/  This test is not yet approved or cleared by the [place] FDA and  has been authorized for detection and/or diagnosis of SARS-CoV-2 by FDA under an Emergency Use Authorization (EUA). This EUA will remain  in effect (meaning this test can be used) for the duration of the COVID-19 declaration under Se ction 564(b)(1) of the Act, 21 U.S.C. section 360bbb-3(b)(1), unless the authorization is terminated or revoked sooner.  Performed at [facility], [place] [address] [place], [place] [address]           Radiology Studies: No results found.      Scheduled Meds: . ALPRAZolam  0.5 mg Oral BID  . aspirin EC  81 mg Oral QHS  . bisacodyl  5 mg Oral Daily  . dexamethasone  8 mg Oral Daily  . enoxaparin (LOVENOX) injection  40 mg Subcutaneous Q24H  . fentaNYL   Intravenous Q4H  . ketorolac  30 mg Intravenous Q8H  . lidocaine  1 patch Transdermal Q24H  . magic mouthwash  15 mL Oral QID  . methadone  10 mg Oral Q8H  . OLANZapine  2.5 mg Oral QHS  . ondansetron  4 mg Oral TID AC  . pantoprazole  40 mg Oral BID  . polyethylene glycol  17 g Oral BID  . senna-docusate  1 tablet Oral BID  . simethicone  80 mg Oral QID   Continuous Infusions:    LOS: 8 days    Time spent: 35 minutes    Irine Seal, MD [facility]   To contact the attending provider between 7A-7P or the covering provider during after hours 7P-7A, please log into the web site [email or web address] and access using universal [place] password for that web site. If you do not have the password, please call the hospital operator.  03/04/2021, 10:30 AM

## 2021-03-04 NOTE — Progress Notes (Signed)
   Mrs. Seneca has been evalutated and is undergoing current consideration for hospice care upon d/c home from hospital. I spoke to her daughter Brigid Re and confirmed that her goals and this was the pt's wishes. She will go to live with the pt daughter at d/c. Her address is [place] [address]. I spoke about the support that hospice would be able to provide in home. We however would not be able to provide a fentanyl PCA due to nation wide shortage. We are unable to get this medication from our pharmacy.   I have updated Dr. Hilma Favors on this as well and she is hopeful that with starting methadone she will be able to wean off fentanyl when the methadone gets level in system. I have updated her daughter that we are unable to get Fentanyl as well. The daughter was going to visit pt today and have conversation with her about what equipment she felt would be needed to keep her mother comfortable at the home. The daughter Wilfred Lacy is suppose to return a call to me today after this discussion so that I can get any needed equipment ordered for delivery. I am waiting this phone call. I called her earlier and left VM to return call. Webb Silversmith RN [phone]

## 2021-03-04 NOTE — Progress Notes (Addendum)
HEMATOLOGY-ONCOLOGY PROGRESS NOTE  SUBJECTIVE: Pain is much better controlled this morning on fentanyl PCA.  She has also been started on methadone.  Reported mild nausea this morning which was controlled with oral antiemetics.  Oncology History  Pancreatic cancer ([place])  03/20/2020 Initial Diagnosis   Pancreatic cancer ([place])   03/28/2020 - 11/27/2020 Chemotherapy         03/31/2020 Genetic Testing   Negative genetic testing on the common hereditary cancer panel and pancreatitis cancer panel.  The custom Hereditary Gene Panel offered by [facility] includes sequencing and/or deletion duplication testing of the following 56 genes: APC*, ATM*, AXIN2, BARD1, BMPR1A, BRCA1, BRCA2, BRIP1, CASR, CDH1, CDK4, CDKN2A (p14ARF), CDKN2A (p16INK4a), CFTR*, CHEK2, CPA1, CTNNA1, CTRC, DICER1*, EPCAM*, FANCC, GREM1*, HOXB13, KIT, MEN1*, MLH1*, MSH2*, MSH3*, MSH6*, MUTYH, NBN, NF1*, NTHL1, PALB2, PALLD, PDGFRA, PMS2*, POLD1*, POLE, PRSS1*, PTEN*, RAD50, RAD51C, RAD51D, RNF43, SDHA*, SDHB, SDHC*, SDHD, SMAD4, SMARCA4, SPINK1, STK11, TP53, TSC1*, TSC2, and VHL The report date is 03/31/2020.   01/08/2021 -  Chemotherapy    Patient is on Treatment Plan: PANCREATIC ABRAXANE / GEMCITABINE D1,8,15 Q28D       PHYSICAL EXAMINATION:  Vitals:   03/03/21 2158 03/04/21 0523  BP: 118/78 (!) 142/78  Pulse: 75 (!) 58  Resp: 16 16  Temp: 98.3 F (36.8 C) (!) 97.5 F (36.4 C)  SpO2: 94% 95%   Filed Weights   02/24/21 1115  Weight: 54.4 kg    Intake/Output from previous day: 04/04 0701 - 04/05 0700 In: 601 [P.O.:600; I.V.:1] Out: -   GENERAL: Chronically ill-appearing.  No acute distress. SKIN: Erythematous rash noted to back, legs, chest, less prominent OROPHARYNX: No thrush or ulcers. LUNGS: Clear bilaterally HEART: regular rate & rhythm  ABDOMEN: Positive bowel sounds, soft, mild generalized tenderness. NEURO: alert & oriented x 3 with fluent speech, no focal motor/sensory deficits  LABORATORY DATA:  I have  reviewed the data as listed CMP Latest Ref Rng & Units 03/04/2021 03/03/2021 03/02/2021  Glucose 70 - 99 mg/dL 90 93 92  BUN 8 - 23 mg/dL 9 7(L) 6(L)  Creatinine 0.44 - 1.00 mg/dL 0.31(L) 0.36(L) 0.37(L)  Sodium 135 - 145 mmol/L 138 135 137  Potassium 3.5 - 5.1 mmol/L 3.6 3.9 3.6  Chloride 98 - 111 mmol/L 105 102 102  CO2 22 - 32 mmol/L _0 Calcium 8.9 - 10.3 mg/dL 8.4(L) 8.4(L) 8.1(L)  Total Protein 6.5 - 8.1 g/dL - - -  Total Bilirubin 0.3 - 1.2 mg/dL - - -  Alkaline Phos 38 - 126 U/L - - -  AST 15 - 41 U/L - - -  ALT 0 - 44 U/L - - -    Lab Results  Component Value Date   WBC 5.0 03/03/2021   HGB 10.1 (L) 03/03/2021   HCT 31.3 (L) 03/03/2021   MCV 94.8 03/03/2021   PLT 333 03/03/2021   NEUTROABS 3.2 02/24/2021    CT Abdomen Pelvis Wo Contrast  Result Date: 02/24/2021 CLINICAL DATA:  Abdominal distension, back and abdominal pain, pancreatic cancer EXAM: CT ABDOMEN AND PELVIS WITHOUT CONTRAST TECHNIQUE: Multidetector CT imaging of the abdomen and pelvis was performed following the standard protocol without IV contrast. COMPARISON:  11/14/2020 FINDINGS: Lower chest: No acute abnormality. Hepatobiliary: No solid liver abnormality is seen. No gallstones or gallbladder wall thickening. Redemonstrated common bile duct stent with post stenting pneumobilia. Pancreas: Although ill-defined and difficult to clearly evaluate, particularly by noncontrast examination, pancreatic head mass appears significantly enlarged, overall dimensions of the  pancreatic head approximately 4.4 x 4.3 cm, previously 3.1 x 2.9 cm when measured similarly (series 2, image 25). Spleen: Normal in size without significant abnormality. Adrenals/Urinary Tract: Adrenal glands are unremarkable. Kidneys are normal, without renal calculi, solid lesion, or hydronephrosis. Bladder is unremarkable. Stomach/Bowel: Stomach is within normal limits. Appendix appears normal. No evidence of bowel wall thickening, distention, or  inflammatory changes. Vascular/Lymphatic: Aortic atherosclerosis. Although indistinct and difficult to precisely measure, enlarged, matted left periaortic lymph nodes appear increased in size compared to prior examination, measuring approximately 3.5 x 2.7 cm, previously 2.4 x 2.1 cm when measured similarly (series 2, image 23). Reproductive: No mass or other significant abnormality. Other: No abdominal wall hernia or abnormality. Anasarca. Small volume ascites. Musculoskeletal: No acute or significant osseous findings. IMPRESSION: 1. Although ill-defined and difficult to clearly evaluate, particularly by noncontrast examination, pancreatic head mass appears significantly enlarged compared to prior examination, consistent with worsened primary pancreatic adenocarcinoma. 2. Although indistinct and difficult to precisely measure, enlarged, matted left periaortic lymph nodes appear increased in size compared to prior examination, concerning for worsened nodal metastatic disease. 3. Redemonstrated common bile duct stent with post stenting pneumobilia. 4. Small volume ascites and anasarca. Aortic Atherosclerosis (ICD10-I70.0). Electronically Signed   By: Eddie Candle M.D.   On: 02/24/2021 13:46   DG ABD ACUTE 2+V W 1V CHEST  Result Date: 02/24/2021 CLINICAL DATA:  Worsening abdominal pain with nausea and vomiting. History of pancreatic cancer. EXAM: DG ABDOMEN ACUTE WITH 1 VIEW CHEST COMPARISON:  Chest radiographs 01/31/2021.  Abdominal CT 11/14/2020. FINDINGS: Frontal chest radiograph demonstrates mild patient rotation to the right. Right IJ Port-A-Cath appears unchanged at the level of the superior cavoatrial junction. The heart size and mediastinal contours are stable allowing for the rotation. The lungs are clear. There is no pleural effusion or pneumothorax. Metallic biliary stent appears unchanged in position. Associated expected pneumobilia appears less evident. The bowel gas pattern is normal. There is no  free intraperitoneal air. There is stable mild degenerative changes in the spine. IMPRESSION: 1. No acute cardiopulmonary or abdominal process identified. 2. Stable position of the biliary stent. Pneumobilia not well visualized; if concern of biliary obstruction, consider further evaluation with follow-up CT. Electronically Signed   By: Richardean Sale M.D.   On: 02/24/2021 12:12    ASSESSMENT AND PLAN: 1. 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 MRI abdomen 02/01/2020-2.2 x 3.3 cm mass in the posterior pancreas head/uncinate process, mild intrahepatic/extrahepatic ductal dilatation, no evidence of vascular invasion, possible small lymph nodes in the porta hepatis-poorly visualized ? EUS 02/29/2020-20 x 23 mm mass in the pancreas head, upstream pancreatic duct dilatation, 1 abnormal peripancreatic node, uT3?uN1 ? 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 CTs 03/19/2020-2.9 x 2.1 x 3.1 cm pancreas head/uncinate mass, lesion associated with infrarenal abdominal aorta and superior mesenteric artery, prominent and borderline enlarged retroperitoneal nodes including a left periaortic node, indeterminate 4 mm lung nodule ? Cycle 1 mFOLFIRINOX 5/46/50, complicated by n/v and hospital admission 5/2/ - 5/3 ? [facility] opinion at [facility] with Dr. Earnestine Mealing and Dr. Mariah Milling 04/02/20 ? PET scan 04/03/2020-hypermetabolic poorly marginated pancreatic head mass; 2 hypermetabolic left periaortic lymph nodes, hypermetabolic left supraclavicular lymph node ? Cycle 2 FOLFIRINOX 04/11/2020 ? Ultrasound-guided biopsy of a left supraclavicular mass on 04/18/2020-poorly differentiated adenocarcinoma, cytokeratin 7+, cytokeratin 20 and CDX2 positive in rare cells. ? Cycle 3 FOLFIRINOX 04/24/2020-oxaliplatin infusion lengthened to 3 hours, Udenyca was not given (ordered) ? Cycle  4 FOLFIRINOX 05/08/2020 ? Cycle 5 FOLFIRINOX 05/22/2020-atropine and Ativan premedication added  secondary to cholinergic symptoms and nausea from irinotecan ? CTs 05/29/2020-decreased left supraclavicular lymph node, slight decrease in size of the pancreas head mass, decrease in retroperitoneal lymphadenopathy ? Cycle 6 FOLFIRINOX 06/05/2020 ? Cycle 7 FOLFIRINOX 06/26/2020 ? Cycle 8 FOLFIRINOX 07/11/2020 (oxaliplatin held secondary to neuropathy) ? Cycle 9 FOLFIRINOX 07/24/2020 (oxaliplatin held secondary to neuropathy) ? Cycle 10 FOLFIRINOX 08/07/2020 (oxaliplatin held secondary to neuropathy) ? CTs 08/19/2020-subtle 5 mm peripheral right liver lesion, pancreas mass unchanged and difficult to discretely delineate, unchanged left retroperitoneal node, no evidence of disease progression, onDr. 'sreview of the CTs there is no change in the previously enlarged left supraclavicular node ? Cycle 11 FOLFIRINOX 09/18/2020 (oxaliplatin held secondary to neuropathy) ? Cycle 12 FOLFIRINOX 10/08/2020 (oxaliplatin held secondary to neuropathy) ? Cycle 13 FOLFIRINOX 10/30/2020 (oxaliplatin held secondary to neuropathy) ? CTs 11/14/2020-no evidence of thoracic metastases, slight enlargement of the pancreas head mass, increase in size of periaortic lymph node ? Cycle 14 FOLFIRINOX 11/25/2020 (oxaliplatin held secondary to neuropathy) ? Cycle 1 gemcitabine/Abraxane 01/08/2021 ? Cycle 2 gemcitabine/Abraxane 01/30/2021 ? Cycle 3 gemcitabine 02/14/2021, Abraxane held due to progressive neuropathy  2. Abdomen/back pain secondary to #1-progressive January 2022 3. Anorexia/weight loss secondary to #1 4. History of coronary artery disease,STEMI with the fib arrest 2011, status post RCA stent 5. Constipation, secondary to #1 and morphine 6. 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 Metal stent placed into the common bile duct 09/06/2020. 9.Hoarsenessstatus post vocal cord injections2/24/2022  Vicki [name] appears improved.  Appreciate assistance from palliative care team with pain management.  She is now on a fentanyl PCA and also on methadone.  Her pain is significantly better today.  Anticipate that she will discharge home with an IV pain pump.  Goals of care were readdressed with the patient yesterday and she now wishes to discharged home with hospice.  She will be staying at her daughter's home.  Recommendations: 1.  Narcotics per the palliative care service 2.  Continue dexamethasone. 3.  Continue current bowel regimen. 4.  Continue as needed Zofran and Zyprexa was added yesterday. 5.  The patient will plan to discharge to home once pain is adequately controlled and hospice services are arranged.   LOS: 8 days   Mikey Bussing,  AGPCNP-BC 03/04/21 Vicki [name] was interviewed and examined.  Her pain appears to be under better control today.  We discussed disposition plans.  She plans to be discharged to the home of her daughter with home hospice care.  This will be in [facility].  I discussed the case with her husband by telephone.  He reports the family is in agreement with hospice care.  We will arrange for outpatient follow-up at the Cancer center (in person or telehealth) in approximately 2 weeks.  I was present for greater than 50% of today's visit.  I perform medical decision making.  Julieanne Manson, MD

## 2021-03-05 DIAGNOSIS — R109 Unspecified abdominal pain: Secondary | ICD-10-CM | POA: Diagnosis not present

## 2021-03-05 DIAGNOSIS — G893 Neoplasm related pain (acute) (chronic): Secondary | ICD-10-CM | POA: Diagnosis not present

## 2021-03-05 LAB — BASIC METABOLIC PANEL
Anion gap: 7 (ref 5–15)
BUN: 9 mg/dL (ref 8–23)
CO2: 26 mmol/L (ref 22–32)
Calcium: 8.3 mg/dL — ABNORMAL LOW (ref 8.9–10.3)
Chloride: 105 mmol/L (ref 98–111)
Creatinine, Ser: 0.56 mg/dL (ref 0.44–1.00)
GFR, Estimated: >60 mL/min (ref >60)
Glucose, Bld: 88 mg/dL (ref 70–99)
Potassium: 3.7 mmol/L (ref 3.5–5.1)
Sodium: 138 mmol/L (ref 135–145)

## 2021-03-05 LAB — CBC WITH DIFFERENTIAL/PLATELET
Abs Immature Granulocytes: 0.01 10*3/uL (ref 0.00–0.07)
Basophils Absolute: 0 10*3/uL (ref 0.0–0.1)
Basophils Relative: 1 %
Eosinophils Absolute: 0 10*3/uL (ref 0.0–0.5)
Eosinophils Relative: 1 %
HCT: 29.9 % — ABNORMAL LOW (ref 36.0–46.0)
Hemoglobin: 9.9 g/dL — ABNORMAL LOW (ref 12.0–15.0)
Immature Granulocytes: 0 %
Lymphocytes Relative: 31 %
Lymphs Abs: 1.9 10*3/uL (ref 0.7–4.0)
MCH: 31.6 pg (ref 26.0–34.0)
MCHC: 33.1 g/dL (ref 30.0–36.0)
MCV: 95.5 fL (ref 80.0–100.0)
Monocytes Absolute: 0.5 10*3/uL (ref 0.1–1.0)
Monocytes Relative: 8 %
Neutro Abs: 3.8 10*3/uL (ref 1.7–7.7)
Neutrophils Relative %: 59 %
Platelets: 403 10*3/uL — ABNORMAL HIGH (ref 150–400)
RBC: 3.13 MIL/uL — ABNORMAL LOW (ref 3.87–5.11)
RDW: 16.5 % — ABNORMAL HIGH (ref 11.5–15.5)
WBC: 6.2 10*3/uL (ref 4.0–10.5)
nRBC: 0 % (ref 0.0–0.2)

## 2021-03-05 LAB — MAGNESIUM: Magnesium: 1.6 mg/dL — ABNORMAL LOW (ref 1.7–2.4)

## 2021-03-05 MED ORDER — OXYCODONE HCL 5 MG PO TABS
10.0000 mg | ORAL_TABLET | Freq: Once | ORAL | Status: AC
  Administered 2021-03-05: 10 mg via ORAL
  Filled 2021-03-05: qty 2

## 2021-03-05 MED ORDER — OXYCODONE HCL 20 MG/ML PO CONC
30.0000 mg | ORAL | Status: DC | PRN
  Administered 2021-03-05: 30 mg via ORAL
  Filled 2021-03-05: qty 2

## 2021-03-05 MED ORDER — AZELASTINE HCL 0.1 % NA SOLN
2.0000 | Freq: Two times a day (BID) | NASAL | Status: DC
  Administered 2021-03-05 – 2021-03-08 (×7): 2 via NASAL
  Filled 2021-03-05: qty 30

## 2021-03-05 MED ORDER — SENNOSIDES-DOCUSATE SODIUM 8.6-50 MG PO TABS
1.0000 | ORAL_TABLET | Freq: Every day | ORAL | Status: DC
  Administered 2021-03-06: 1 via ORAL
  Filled 2021-03-05 (×3): qty 1

## 2021-03-05 MED ORDER — CELECOXIB 100 MG PO CAPS
100.0000 mg | ORAL_CAPSULE | Freq: Two times a day (BID) | ORAL | Status: DC
  Administered 2021-03-05: 100 mg via ORAL
  Filled 2021-03-05 (×2): qty 1

## 2021-03-05 MED ORDER — SODIUM CHLORIDE 0.9% FLUSH: 9.0000 mL | INTRAVENOUS | Status: DC | PRN

## 2021-03-05 MED ORDER — DIPHENHYDRAMINE HCL 50 MG/ML IJ SOLN: 12.5000 mg | Freq: Four times a day (QID) | INTRAMUSCULAR | Status: DC | PRN

## 2021-03-05 MED ORDER — OXYCODONE HCL 5 MG/5ML PO SOLN
20.0000 mg | ORAL | Status: DC | PRN
Start: 2021-03-05 — End: 2021-03-05

## 2021-03-05 MED ORDER — DIPHENHYDRAMINE HCL 12.5 MG/5ML PO ELIX: 12.5000 mg | ORAL_SOLUTION | Freq: Four times a day (QID) | ORAL | Status: DC | PRN

## 2021-03-05 MED ORDER — OXYCODONE HCL 5 MG/5ML PO SOLN
20.0000 mg | Freq: Four times a day (QID) | ORAL | Status: DC
  Administered 2021-03-05: 20 mg via ORAL
  Filled 2021-03-05: qty 20

## 2021-03-05 MED ORDER — NALOXONE HCL 0.4 MG/ML IJ SOLN: 0.4000 mg | INTRAMUSCULAR | Status: DC | PRN

## 2021-03-05 MED ORDER — OXYCODONE HCL 20 MG/ML PO CONC: 20.0000 mg | ORAL | Status: DC | PRN

## 2021-03-05 MED ORDER — MAGNESIUM SULFATE 2 GM/50ML IV SOLN
2.0000 g | Freq: Once | INTRAVENOUS | Status: AC
  Administered 2021-03-05: 2 g via INTRAVENOUS
  Filled 2021-03-05: qty 50

## 2021-03-05 MED ORDER — CELECOXIB 200 MG PO CAPS
200.0000 mg | ORAL_CAPSULE | Freq: Two times a day (BID) | ORAL | Status: DC
  Administered 2021-03-05 – 2021-03-08 (×6): 200 mg via ORAL
  Filled 2021-03-05 (×8): qty 1

## 2021-03-05 MED ORDER — ONDANSETRON HCL 4 MG/2ML IJ SOLN: 4.0000 mg | Freq: Four times a day (QID) | INTRAMUSCULAR | Status: DC | PRN

## 2021-03-05 MED ORDER — POLYETHYLENE GLYCOL 3350 17 G PO PACK: 17.0000 g | PACK | Freq: Two times a day (BID) | ORAL | Status: DC | PRN

## 2021-03-05 MED ORDER — MAGNESIUM OXIDE 400 (241.3 MG) MG PO TABS
400.0000 mg | ORAL_TABLET | Freq: Every day | ORAL | Status: DC
  Administered 2021-03-05 – 2021-03-08 (×4): 400 mg via ORAL
  Filled 2021-03-05 (×4): qty 1

## 2021-03-05 MED ORDER — FENTANYL 50 MCG/ML IV PCA SOLN
INTRAVENOUS | Status: DC
  Administered 2021-03-06: 450 ug via INTRAVENOUS
  Administered 2021-03-06: 150 ug via INTRAVENOUS
  Administered 2021-03-06: 420.1 ug via INTRAVENOUS
  Administered 2021-03-06: 450 ug via INTRAVENOUS
  Administered 2021-03-06: 300 ug via INTRAVENOUS
  Administered 2021-03-07: 500 ug via INTRAVENOUS
  Administered 2021-03-07: 350 ug/h via INTRAVENOUS
  Administered 2021-03-07: 0 ug via INTRAVENOUS
  Administered 2021-03-07: 319.9 ug via INTRAVENOUS
  Administered 2021-03-07: 60.04 ug via INTRAVENOUS
  Filled 2021-03-05 (×4): qty 20

## 2021-03-05 NOTE — Progress Notes (Signed)
Spoke with Dr. Domingo Cocking about patient having uncontrolled 10/10 pain ever since Fentanyl PCA was discontinued today. MD stated to go ahead and give 30 mg PRN dose of oxycodone since PRN Fentanyl and scheduled medications are not working. Care for this patient has been handed off to [place], Therapist, sports.

## 2021-03-05 NOTE — Progress Notes (Signed)
Palliative Care Progress Note  Vicki [name] is overall much improved.Methadone was started on 4/4-day two. She is using a fentanyl PCA, 400mg  total in 24 hours. She hopeful to be able to go home soon. We discussed the pro's and con's of going home on a PCA-she would like to try to go off the PCA and see how she does-hopeful for d/c tomorrow. There is also a shortage of IV fentanyl which may make this more challenging.She is day two of methadone so I anticipate methadone will continue to reach more effective levels over the next few days.  Plan:  1. Will try to stop her fentanyl PCA but I will leave RN bolus doses available if she has severe pain.  2. Will start Oxycodone 20mg  q4 prn, encouraged her to be proactive about asking for this if her pain begins to worsen. Will give her one dose now.  3. Will transition the IV toradol to celebrex- 200 BID  4. Hospice has reached out to her daughter about equipment set up and needs.  Today we discussed her support system outside of the hospital and I provided reassurance that her pain management could continue even once she is at home- I asked her to consider what was a tolerable place for her where she felt she could go home.  Her nausea and appetite are improved.-Will continue steroids and scheduled zofran and PM xyprexa.  Hopeful for d/c tomorrow depending on how she does off of the PCA.  Lane Hacker, DO Palliative Medicine

## 2021-03-05 NOTE — Progress Notes (Signed)
PROGRESS NOTE  Vicki [name]  DOB: Sep 10, 1956  PCP: Midge Minium, MD INO:[id number]  DOA: 02/24/2021  LOS: 9 days   Chief Complaint  Patient presents with  . Abdominal Pain   Brief narrative: Vicki [name] is a 65 y.o. female with PMH significant for pancreatic cancer with biliary stent on gemcitabine/Abraxane(last received gemcitabine on 3/18), COPD, chronic cancer-related painon chronic opiates, collagenous colitis, hypertension, hyperlipidemia, MI and cardiogenic shock. Patient presented to the ED on 3/28 with complaint of nausea, vomiting and severe abdominal pain. She was admitted to hospitalist service. She was placed on Dilaudid PCA pump. Palliative care involved for pain management.  Subjective: Patient was seen and examined this morning. Elderly Caucasian female.  Sitting up in bed.  Pain controlled at this time.  Assessment/Plan: Intractable abdominal pain, nausea, vomiting Progressive adenocarcinoma of the pancreas with suspected nodal mets and biliary stent -CT abdomen and pelvis showed progression of pancreatic cancer and nodal metastatic disease with small volume ascites and anasarca.  Biliary stent did not appear obstructed. -At home pain was not adequately controlled with MS Contin 100 mg every 8 hours, oxycodone 30 mg every 4 hours PRN. -Palliative care consult and oncology consult obtained.  Hospice care recommended. -Currently patient is getting symptomatic management with methadone 10 mg 3 times daily, fentanyl PCA, Lidoderm [name], low-dose Zyprexa, scheduled Zofran, simethicone and Decadron. -Currently on soft/regular diet -Once patient's pain is adequately controlled, plan is to discharge her home with home hospice and pain management regimen  Constipation -Opioid related. -Continue current bowel regimen of MiraLAX twice daily, Senokot-S twice daily, Dulcolax daily.  -Mineral oil enema as needed.   Hypokalemia/hypomagnesemia -Magnesium level low at  1.6 today.  Replacement ordered. Recent Labs  Lab 03/01/21 0425 03/02/21 0541 03/03/21 0549 03/04/21 0532 03/05/21 0612  K 3.7 3.6 3.9 3.6 3.7  MG 1.6* 2.1 2.0 1.9 1.6*   COPD -Continue duo nebs as needed.   CAD, MI, history of cardiogenic shock Hyperlipidemia -Continue aspirin.  Statin on hold  Mobility: Encourage ambulation Code Status:   Code Status: DNR  Nutritional status: Body mass index is 19.37 kg/m.     Diet Order            Diet regular Room service appropriate? Yes; Fluid consistency: Thin  Diet effective now                 DVT prophylaxis: enoxaparin (LOVENOX) injection 40 mg Start: 02/24/21 2200   Antimicrobials:  None Fluid: None Consultants: Oncology, palliative care Family Communication:  Husband at bedside  Status is: Inpatient  Remains inpatient appropriate because: Pain medicines being adjusted  Dispo: The patient is from: Home              Anticipated d/c is to: Home with hospice once pain medication regimen is finalized              Patient currently is not medically stable to d/c.   Difficult to place patient No       Infusions:    Scheduled Meds: . aspirin EC  81 mg Oral QHS  . bisacodyl  5 mg Oral Daily  . dexamethasone  8 mg Oral Daily  . enoxaparin (LOVENOX) injection  40 mg Subcutaneous Q24H  . fentaNYL   Intravenous Q4H  . ketorolac  30 mg Intravenous Q8H  . lidocaine  1 [name] Transdermal Q24H  . magic mouthwash  15 mL Oral QID  . methadone  10 mg Oral Q8H  .  OLANZapine  2.5 mg Oral QHS  . ondansetron  4 mg Oral TID AC  . pantoprazole  40 mg Oral BID  . polyethylene glycol  17 g Oral BID  . senna-docusate  1 tablet Oral BID  . simethicone  80 mg Oral QID    Antimicrobials: Anti-infectives (From admission, onward)   None      PRN meds: ALPRAZolam, diphenhydrAMINE **OR** diphenhydrAMINE, fentaNYL (SUBLIMAZE) injection, ipratropium-albuterol, LORazepam, mineral oil, naloxone **AND** sodium chloride flush,  ondansetron (ZOFRAN) IV, sodium chloride flush   Objective: Vitals:   03/04/21 2005 03/05/21 0555  BP: 130/73 140/71  Pulse: 70 (!) 57  Resp: 18 14  Temp: (!) 97.5 F (36.4 C) (!) 97.4 F (36.3 C)  SpO2: 97% 96%    Intake/Output Summary (Last 24 hours) at 03/05/2021 1238 Last data filed at 03/05/2021 1000 Gross per 24 hour  Intake 238 ml  Output --  Net 238 ml   Filed Weights   02/24/21 1115  Weight: 54.4 kg   Weight change:  Body mass index is 19.37 kg/m.   Physical Exam: General exam: Pleasant elderly Caucasian female Skin: No rashes, lesions or ulcers. HEENT: Atraumatic, normocephalic, no obvious bleeding Lungs: Clear to auscultation bilaterally CVS: Regular rate and rhythm, no murmur GI/Abd soft, mild epigastric tenderness, bowel sound present CNS: Alert, awake abound x3 Psychiatry: Depressed look Extremities: No pedal edema, no calf tenderness  Data Review: I have personally reviewed the laboratory data and studies available.  Recent Labs  Lab 02/27/21 0525 02/28/21 0603 03/01/21 0425 03/03/21 0549 03/05/21 0612  WBC 5.4 6.7 6.8 5.0 6.2  NEUTROABS  --   --   --   --  3.8  HGB 11.3* 10.8* 9.9* 10.1* 9.9*  HCT 33.6* 32.8* 29.9* 31.3* 29.9*  MCV 91.3 92.7 93.7 94.8 95.5  PLT 288 283 275 333 403*   Recent Labs  Lab 03/01/21 0425 03/02/21 0541 03/03/21 0549 03/04/21 0532 03/05/21 0612  NA 136 137 135 138 138  K 3.7 3.6 3.9 3.6 3.7  CL 102 102 102 105 105  CO2 27 29 27 27 26   GLUCOSE 118* 92 93 90 88  BUN 8 6* 7* 9 9  CREATININE 0.51 0.37* 0.36* 0.31* 0.56  CALCIUM 8.4* 8.1* 8.4* 8.4* 8.3*  MG 1.6* 2.1 2.0 1.9 1.6*    F/u labs ordered Unresulted Labs (From admission, onward)         None      Signed, Terrilee Croak, MD [facility] 03/05/2021

## 2021-03-05 NOTE — Progress Notes (Signed)
Palliative care brief progress note  Received call from RN that [name] is reporting that her pain has been uncontrolled since stopping PCA.  I saw and examined Vicki [name] and we discussed pain management moving forward.  She has had 2 rescue doses of 162mcg of IV fentanyl.  She has not yet had any oxycodone.  Discussed plan to schedule oxycodone 20mg  every hours with additional dose of oxycodone 20mg  every 3 hours as needed for breakthrough pain.  She is agreeable to trying this, but also reports that her home dose of oxycodone has been 30mg  for a very long time and she is worried that the lower dose will not be effective.  Discussed that I will call in this evening and if 20mg  dose is not sufficient to relieve pain, we could consider increase in oxycodone.  Overall, plan remains to transition home with hospice and overall goal is sufficient pain relief and for her comfort.  D/w Dr. Hilma Favors.  Micheline Rough, MD [place] Team [phone]  No Charge Note

## 2021-03-05 NOTE — Progress Notes (Signed)
Palliative care brief note  I called to check in on Vicki [name] this evening.  Her RN reports that her pain remains uncontrolled.  She is just received rescue dose of fentanyl 100 mcg.  Discussed plan to reassess in 20 minutes and if she is still in pain we will try higher dose of oxycodone (30 mg, which she reports is her home dose).  If pain remains uncontrolled after this, will plan to restart fentanyl PCA overnight and reassess tomorrow as she may just need more time for methadone to reach steady state.  Micheline Rough, MD [place] Team [phone]

## 2021-03-05 NOTE — Progress Notes (Signed)
Vicki [name] continues to have uncontrolled pain.  Will plan to restart fentanyl PCA overnight.  Micheline Rough, MD [place] Team [phone]

## 2021-03-06 ENCOUNTER — Inpatient Hospital Stay: Payer: Medicare Other

## 2021-03-06 ENCOUNTER — Inpatient Hospital Stay: Payer: Medicare Other | Admitting: Oncology

## 2021-03-06 DIAGNOSIS — R109 Unspecified abdominal pain: Secondary | ICD-10-CM | POA: Diagnosis not present

## 2021-03-06 MED ORDER — ALPRAZOLAM 0.25 MG PO TABS
0.2500 mg | ORAL_TABLET | Freq: Two times a day (BID) | ORAL | Status: DC
  Administered 2021-03-06 – 2021-03-07 (×2): 0.25 mg via ORAL
  Filled 2021-03-06: qty 1

## 2021-03-06 MED ORDER — BUDESONIDE 3 MG PO CPEP
3.0000 mg | ORAL_CAPSULE | Freq: Every day | ORAL | Status: DC
  Administered 2021-03-06 – 2021-03-08 (×3): 3 mg via ORAL
  Filled 2021-03-06 (×3): qty 1

## 2021-03-06 MED ORDER — ALPRAZOLAM 0.25 MG PO TABS
0.2500 mg | ORAL_TABLET | Freq: Two times a day (BID) | ORAL | Status: DC
  Administered 2021-03-06: 0.25 mg via ORAL
  Filled 2021-03-06 (×2): qty 1

## 2021-03-06 NOTE — Progress Notes (Signed)
Chaplain engaged in a follow-up visit.  Chaplain had previously spoken with Rosangela's husband, Merry Proud.  Demetra shared her strong faith, her healthcare journey, family, and plan outside of the hospital with chaplain.  Harold grew up in a [place] household.  She credits her dad for teaching her how to be strong in her faith.  Chaplain could assess that Velora's faith has gotten her through monumental moments in her life.  Gustavo shared that she had a massive heart attack about ten years ago that left her in a coma for about a month.  She declared her gratefulness for the time she was given after that life-changing event to provide education around defibrillators, systems of heart attacks for women, and to speak about her faith to others.  Trinidee detailed the many people that helped her through her recovery and divine placement of those people that saved her.   Chaplain also checked in with Gregary Signs regarding her emotions and those of her family as she prepares go into hospice.  She stated that she is ok with whatever God decides for her and she knows that she has lived a good life and been able to accomplish and do what God called her to do.  She understands how hard this has been for her family, but also leans into the preparation she has provided them in reference to faith and her current state of health.  She has been married about 84 years and is thankful for the support and love of not only her husband, but her children.  Chaplain was able to see pictures of Romonia's children and grandchildren.  She talked about them with such joy and is thankful for the time she has been allowed to spend with them.  Darolyn plans on staying at her daughter's house which provides some accessibility because of being one level.  She is excited about the time she has with her daughter as well as the possibilities of transferring back home one day.    03/06/21 1300  Clinical Encounter Type  Visited With Patient  Visit Type Spiritual  support;Initial

## 2021-03-06 NOTE — Progress Notes (Signed)
Palliative Care Progress Note  Vicki [name] had worsening pain yesterday afternoon and into the evening-she got relief around 11 PM shortly after PCA was restarted.She was having some diarrhea and loose stools yesterday, she thinks pain increases just before she has urgency to go to the bathroom. She reports a history of Collagenous Colitis and the description of her pain is classically visceral pain- difficult to localize-deep aching with burning sensation in her abdomen and pelvis.She is day 3 of methadone at 10mg  TID. She has used around 900 mcg of fentanyl via PCA since in was started around 11PM.  She is sitting up in the bed- she is talkative, slightly anxious. Despite reporting severe pain she does not show signs of distress, but clearly remains uncomfortable.  Will leave her on PCA for another 24 hours and reassess her needs. Continue methadone. Will add oral budesonide for her coltis. No other changes today. She has alprazolam as needed for her anxiety-I changed to prn yesterday- but she may need a scheduled dose added back on to her regimen.  Will discharge home(daughter's home) when medically stable and symptoms are under better control.  I will update her daughter and HOTP RN. No family at bedside today during my visit.  Lane Hacker, DO Palliative Medicine  Time: 35 min Greater than 50%  of this time was spent counseling and coordinating care related to the above assessment and plan.

## 2021-03-06 NOTE — Progress Notes (Signed)
PROGRESS NOTE  Vicki [name]  DOB: Oct 16, 1956  PCP: Midge Minium, MD NFA:[id number]  DOA: 02/24/2021  LOS: 10 days   Chief Complaint  Patient presents with  . Abdominal Pain   Brief narrative: Vicki [name] is a 65 y.o. female with PMH significant for pancreatic cancer with biliary stent on gemcitabine/Abraxane(last received gemcitabine on 3/18), COPD, chronic cancer-related painon chronic opiates, collagenous colitis, hypertension, hyperlipidemia, MI and cardiogenic shock. Patient presented to the ED on 3/28 with complaint of nausea, vomiting and severe abdominal pain. She was admitted to hospitalist service. She was placed on Dilaudid PCA pump. Palliative care involved for pain management.   Subjective: Patient was seen and examined this morning. She had some episodes of diarrhea yesterday and this morning. Even from yesterday noted.  Fentanyl PCA pump was stopped with an intention to discharge home without it.  However last night she had severe pain and hence PCA pump was put back.  Assessment/Plan: Intractable abdominal pain, nausea, vomiting Progressive adenocarcinoma of the pancreas with suspected nodal mets and biliary stent -CT abdomen and pelvis showed progression of pancreatic cancer and nodal metastatic disease with small volume ascites and anasarca.  Biliary stent did not appear obstructed. -At home patient was on morphine and oxycodone for pain but it was not adequately controlled. -Palliative care consult and oncology consult obtained.  Hospice care recommended. -Currently patient is getting symptomatic management with methadone 10 mg 3 times daily, fentanyl PCA, Lidoderm patch, low-dose Zyprexa, scheduled Zofran, simethicone and Decadron. -Currently on soft/regular diet -Once patient's pain is adequately controlled, plan is to discharge her home with home hospice and pain management regimen  Collagenous colitis -Patient is having episodes of diarrhea and  deep-seated abdominal pain which probably could be related to a flareup of collagenous colitis.  Palliative care has started the patient on budesonide.  Hypokalemia/hypomagnesemia -Improving with replacement. Recent Labs  Lab 03/01/21 0425 03/02/21 0541 03/03/21 0549 03/04/21 0532 03/05/21 0612  K 3.7 3.6 3.9 3.6 3.7  MG 1.6* 2.1 2.0 1.9 1.6*   COPD -Continue duo nebs as needed.   CAD, MI, history of cardiogenic shock Hyperlipidemia -Continue aspirin.  Statin on hold  Mobility: Encourage ambulation Code Status:   Code Status: DNR  Nutritional status: Body mass index is 19.75 kg/m.     Diet Order            Diet regular Room service appropriate? Yes; Fluid consistency: Thin  Diet effective now                 DVT prophylaxis: enoxaparin (LOVENOX) injection 40 mg Start: 02/24/21 2200   Antimicrobials:  None Fluid: None Consultants: Oncology, palliative care Family Communication:  Husband not at bedside  Status is: Inpatient  Remains inpatient appropriate because: Pain medicines being adjusted  Dispo: The patient is from: Home              Anticipated d/c is to: Home with hospice once pain medication regimen is finalized              Patient currently is not medically stable to d/c.   Difficult to place patient No       Infusions:    Scheduled Meds: . ALPRAZolam  0.25 mg Oral BID  . aspirin EC  81 mg Oral QHS  . azelastine  2 spray Each Nare BID  . bisacodyl  5 mg Oral Daily  . budesonide  3 mg Oral Daily  . celecoxib  200 mg  Oral BID  . dexamethasone  8 mg Oral Daily  . enoxaparin (LOVENOX) injection  40 mg Subcutaneous Q24H  . fentaNYL   Intravenous Q4H  . lidocaine  1 patch Transdermal Q24H  . magic mouthwash  15 mL Oral QID  . magnesium oxide  400 mg Oral Daily  . methadone  10 mg Oral Q8H  . OLANZapine  2.5 mg Oral QHS  . ondansetron  4 mg Oral TID AC  . pantoprazole  40 mg Oral BID  . senna-docusate  1 tablet Oral Daily  .  simethicone  80 mg Oral QID    Antimicrobials: Anti-infectives (From admission, onward)   None      PRN meds: diphenhydrAMINE **OR** diphenhydrAMINE, fentaNYL (SUBLIMAZE) injection, ipratropium-albuterol, LORazepam, mineral oil, naloxone **AND** sodium chloride flush, ondansetron (ZOFRAN) IV, polyethylene glycol, sodium chloride flush   Objective: Vitals:   03/06/21 1002 03/06/21 1423  BP:  122/79  Pulse:  86  Resp: 18 18  Temp:  98.2 F (36.8 C)  SpO2: 100% 100%    Intake/Output Summary (Last 24 hours) at 03/06/2021 1617 Last data filed at 03/06/2021 0730 Gross per 24 hour  Intake 480 ml  Output --  Net 480 ml   Filed Weights   02/24/21 1115 03/06/21 0500  Weight: 54.4 kg 55.5 kg   Weight change:  Body mass index is 19.75 kg/m.   Physical Exam: General exam: Pleasant elderly Caucasian female Skin: No rashes, lesions or ulcers. HEENT: Atraumatic, normocephalic, no obvious bleeding Lungs: Clear to auscultation bilaterally CVS: Regular rate and rhythm, no murmur GI/Abd soft, mild epigastric tenderness, bowel sound present CNS: Alert, awake abound x3 Psychiatry: Depressed look Extremities: No pedal edema, no calf tenderness  Data Review: I have personally reviewed the laboratory data and studies available.  Recent Labs  Lab 02/28/21 0603 03/01/21 0425 03/03/21 0549 03/05/21 0612  WBC 6.7 6.8 5.0 6.2  NEUTROABS  --   --   --  3.8  HGB 10.8* 9.9* 10.1* 9.9*  HCT 32.8* 29.9* 31.3* 29.9*  MCV 92.7 93.7 94.8 95.5  PLT 283 275 333 403*   Recent Labs  Lab 03/01/21 0425 03/02/21 0541 03/03/21 0549 03/04/21 0532 03/05/21 0612  NA 136 137 135 138 138  K 3.7 3.6 3.9 3.6 3.7  CL 102 102 102 105 105  CO2 27 29 27 27 26   GLUCOSE 118* 92 93 90 88  BUN 8 6* 7* 9 9  CREATININE 0.51 0.37* 0.36* 0.31* 0.56  CALCIUM 8.4* 8.1* 8.4* 8.4* 8.3*  MG 1.6* 2.1 2.0 1.9 1.6*    F/u labs ordered Unresulted Labs (From admission, onward)         None       Signed, Terrilee Croak, MD [facility] 03/06/2021

## 2021-03-07 MED ORDER — ALUM & MAG HYDROXIDE-SIMETH 200-200-20 MG/5ML PO SUSP
30.0000 mL | ORAL | Status: DC | PRN
  Administered 2021-03-07: 30 mL via ORAL
  Filled 2021-03-07: qty 30

## 2021-03-07 MED ORDER — ALPRAZOLAM 0.5 MG PO TABS: 0.5000 mg | ORAL_TABLET | Freq: Two times a day (BID) | ORAL | Status: DC | PRN

## 2021-03-07 MED ORDER — HYDROMORPHONE 1 MG/ML IV SOLN
INTRAVENOUS | Status: DC
  Administered 2021-03-07: 1.25 mg via INTRAVENOUS
  Administered 2021-03-07: 30 mg via INTRAVENOUS
  Administered 2021-03-08: 1.5 mg via INTRAVENOUS
  Administered 2021-03-08 (×2): 1 mg via INTRAVENOUS
  Administered 2021-03-08: 1.25 mg via INTRAVENOUS
  Administered 2021-03-08: 0.75 mg via INTRAVENOUS
  Filled 2021-03-07: qty 30

## 2021-03-07 MED ORDER — NALOXONE HCL 0.4 MG/ML IJ SOLN: 0.4000 mg | INTRAMUSCULAR | Status: DC | PRN

## 2021-03-07 MED ORDER — ALPRAZOLAM 0.5 MG PO TABS
0.5000 mg | ORAL_TABLET | Freq: Two times a day (BID) | ORAL | Status: DC
  Administered 2021-03-07 – 2021-03-08 (×3): 0.5 mg via ORAL
  Filled 2021-03-07 (×3): qty 1

## 2021-03-07 MED ORDER — SODIUM CHLORIDE 0.9% FLUSH: 9.0000 mL | INTRAVENOUS | Status: DC | PRN

## 2021-03-07 NOTE — Progress Notes (Addendum)
Fentanyl PCA syringe waste 7 ml of a 96mcg/ml in stericyle with Cassie Malstead.

## 2021-03-07 NOTE — Progress Notes (Signed)
Wasted 27ml of a 61mcg/ml of a fentanyl PCA in stericycle.  Witnessed by [employer], RN

## 2021-03-07 NOTE — Progress Notes (Addendum)
Palliative Care Progress Note  Patient is overall doing very well. Improved and using only 250 mcg over the past 24 hours. Nausea has resolved. She is still having loose stools and some preceding abdominal cramping. She has a history of collagenous colitis and she believe she is having a flare of this condition. We discussed being discharged. I think she is ready for d/c but she feels that she is still needing the security of the PCA at this point and she has continued to have pain that comes on suddenly and is afraid without the "button" she may be back to having awful pain- she says the PCA works. At this time there is a Producer, television/film/video of IV fentanyl so we will switch her to an equivalent dose of hydromorphone PCA tonight. If she is doing well tomorrow she should be able to discharge to her daughters home with hospice services-I have discussed this plan with the Hospice RN Webb Silversmith. Will maintain her current methadone dose- could consider increasing dose tomorrow-at day 5.Her appetite is much improved. She does endorse anxiety about the transition home- she is also thinking about her prognosis and having a hard time dealing with the uncertainty of her condition including what symptoms to expect and with prognostication. She has a strong faith and continues to hope for a miracle.I continued to provide reassurance and support.  Fentanyl PCA switch to Hydromorphone at .25mg  q40min prn via PCA for breakthrough pain. Increased prn and scheduled dose of alprazolam for anxiety.  Lane Hacker, DO Palliative Medicine  Time:35 minutes Greater than 50%  of this time was spent counseling and coordinating care related to the above assessment and plan.

## 2021-03-07 NOTE — Care Management Important Message (Signed)
Medicare IM printed for Social Work staff to give to the patient 

## 2021-03-07 NOTE — Progress Notes (Signed)
PROGRESS NOTE  Vicki [name]  DOB: March 04, 1956  PCP: Vicki Minium, MD PXT:[id number]  DOA: 02/24/2021  LOS: 11 days   Chief Complaint  Patient presents with  . Abdominal Pain   Brief narrative: Vicki [name] is a 65 y.o. female with PMH significant for pancreatic cancer with biliary stent on gemcitabine/Abraxane(last received gemcitabine on 3/18), COPD, chronic cancer-related painon chronic opiates, collagenous colitis, hypertension, hyperlipidemia, MI and cardiogenic shock. Patient presented to the ED on 3/28 with complaint of nausea, vomiting and severe abdominal pain. She was admitted to hospitalist service. She was placed on Dilaudid PCA pump. Palliative care involved for pain management.   Subjective: Patient was seen and examined this morning. Abdominal comfort improving.  States she has not been using much fentanyl today.  Assessment/Plan: Intractable abdominal pain, nausea, vomiting Progressive adenocarcinoma of the pancreas with suspected nodal mets and biliary stent -CT abdomen and pelvis showed progression of pancreatic cancer and nodal metastatic disease with small volume ascites and anasarca.  Biliary stent did not appear obstructed. -At home patient was on morphine and oxycodone for pain but it was not adequately controlled. -Palliative care consult and oncology consult obtained.  Hospice care recommended. -Currently patient is getting symptomatic management with methadone 10 mg 3 times daily, fentanyl PCA, Lidoderm patch, low-dose Zyprexa, scheduled Zofran, simethicone and Decadron. -Currently on soft/regular diet -Once patient's pain is adequately controlled, plan is to discharge her home with home hospice and pain management regimen  Collagenous colitis -Patient is having episodes of diarrhea and deep-seated abdominal pain which probably could be related to a flareup of collagenous colitis.  Palliative care restarted the patient on budesonide on 4/7.  Patient  still has diarrhea but reports overall improvement in abdominal pain today  Hypokalemia/hypomagnesemia -Improving with replacement. Recent Labs  Lab 03/01/21 0425 03/02/21 0541 03/03/21 0549 03/04/21 0532 03/05/21 0612  K 3.7 3.6 3.9 3.6 3.7  MG 1.6* 2.1 2.0 1.9 1.6*   COPD -Continue duo nebs as needed.   CAD, MI, history of cardiogenic shock Hyperlipidemia -Continue aspirin.  Statin on hold  Mobility: Encourage ambulation Code Status:   Code Status: DNR  Nutritional status: Body mass index is 19.75 kg/m.     Diet Order            Diet regular Room service appropriate? Yes; Fluid consistency: Thin  Diet effective now                 DVT prophylaxis: enoxaparin (LOVENOX) injection 40 mg Start: 02/24/21 2200   Antimicrobials:  None Fluid: None Consultants: Oncology, palliative care Family Communication:  Husband not at bedside this morning  Status is: Inpatient  Remains inpatient appropriate because: Pain medicines being adjusted  Dispo: The patient is from: Home              Anticipated d/c is to: Home with hospice once pain medication regimen is finalized              Patient currently is not medically stable to d/c.   Difficult to place patient No       Infusions:    Scheduled Meds: . ALPRAZolam  0.5 mg Oral BID  . aspirin EC  81 mg Oral QHS  . azelastine  2 spray Each Nare BID  . bisacodyl  5 mg Oral Daily  . budesonide  3 mg Oral Daily  . celecoxib  200 mg Oral BID  . dexamethasone  8 mg Oral Daily  . enoxaparin (LOVENOX)  injection  40 mg Subcutaneous Q24H  . fentaNYL   Intravenous Q4H  . lidocaine  1 patch Transdermal Q24H  . magic mouthwash  15 mL Oral QID  . magnesium oxide  400 mg Oral Daily  . methadone  10 mg Oral Q8H  . OLANZapine  2.5 mg Oral QHS  . ondansetron  4 mg Oral TID AC  . pantoprazole  40 mg Oral BID  . senna-docusate  1 tablet Oral Daily  . simethicone  80 mg Oral QID    Antimicrobials: Anti-infectives (From  admission, onward)   None      PRN meds: ALPRAZolam, diphenhydrAMINE **OR** diphenhydrAMINE, fentaNYL (SUBLIMAZE) injection, ipratropium-albuterol, mineral oil, naloxone **AND** sodium chloride flush, ondansetron (ZOFRAN) IV, polyethylene glycol, sodium chloride flush   Objective: Vitals:   03/07/21 0846 03/07/21 1417  BP:  125/70  Pulse:  80  Resp: 18 20  Temp:  97.7 F (36.5 C)  SpO2: 96% 100%    Intake/Output Summary (Last 24 hours) at 03/07/2021 1419 Last data filed at 03/07/2021 0545 Gross per 24 hour  Intake 355 ml  Output --  Net 355 ml   Filed Weights   02/24/21 1115 03/06/21 0500  Weight: 54.4 kg 55.5 kg   Weight change:  Body mass index is 19.75 kg/m.   Physical Exam: General exam: Pleasant elderly Caucasian female Skin: No rashes, lesions or ulcers. HEENT: Atraumatic, normocephalic, no obvious bleeding Lungs: Clear to auscultate laterally CVS: Regular rate and rhythm, no murmur GI/Abd soft, mild to moderate generalized abdominal tenderness, bowel sound present CNS: Alert, awake abound x3 Psychiatry: Depressed look Extremities: No pedal edema, no calf tenderness  Data Review: I have personally reviewed the laboratory data and studies available.  Recent Labs  Lab 03/01/21 0425 03/03/21 0549 03/05/21 0612  WBC 6.8 5.0 6.2  NEUTROABS  --   --  3.8  HGB 9.9* 10.1* 9.9*  HCT 29.9* 31.3* 29.9*  MCV 93.7 94.8 95.5  PLT 275 333 403*   Recent Labs  Lab 03/01/21 0425 03/02/21 0541 03/03/21 0549 03/04/21 0532 03/05/21 0612  NA 136 137 135 138 138  K 3.7 3.6 3.9 3.6 3.7  CL 102 102 102 105 105  CO2 27 29 27 27 26   GLUCOSE 118* 92 93 90 88  BUN 8 6* 7* 9 9  CREATININE 0.51 0.37* 0.36* 0.31* 0.56  CALCIUM 8.4* 8.1* 8.4* 8.4* 8.3*  MG 1.6* 2.1 2.0 1.9 1.6*    F/u labs ordered Unresulted Labs (From admission, onward)         None      Signed, Terrilee Croak, MD [facility] 03/07/2021

## 2021-03-08 MED ORDER — ALPRAZOLAM 0.5 MG PO TABS: 0.5000 mg | ORAL_TABLET | Freq: Two times a day (BID) | ORAL | 0 refills | Status: DC

## 2021-03-08 MED ORDER — SIMETHICONE 80 MG PO CHEW
80.0000 mg | CHEWABLE_TABLET | Freq: Four times a day (QID) | ORAL | 0 refills | Status: DC
Start: 2021-03-08 — End: 2021-03-08

## 2021-03-08 MED ORDER — ALUM & MAG HYDROXIDE-SIMETH 200-200-20 MG/5ML PO SUSP: 30.0000 mL | ORAL | 0 refills | Status: DC | PRN

## 2021-03-08 MED ORDER — SIMETHICONE 80 MG PO CHEW: 80.0000 mg | CHEWABLE_TABLET | Freq: Four times a day (QID) | ORAL | 0 refills | Status: AC

## 2021-03-08 MED ORDER — BUDESONIDE 3 MG PO CPEP: 3.0000 mg | ORAL_CAPSULE | Freq: Every day | ORAL | 0 refills | Status: AC

## 2021-03-08 MED ORDER — HYDROMORPHONE 1 MG/ML IV SOLN: 25.0000 mg | INTRAVENOUS | 0 refills | Status: DC

## 2021-03-08 MED ORDER — AZELASTINE HCL 0.1 % NA SOLN: 2.0000 | Freq: Two times a day (BID) | NASAL | 12 refills | Status: DC

## 2021-03-08 MED ORDER — SENNOSIDES-DOCUSATE SODIUM 8.6-50 MG PO TABS: 1.0000 | ORAL_TABLET | Freq: Every day | ORAL | 0 refills | Status: AC

## 2021-03-08 MED ORDER — LIDOCAINE 5 % EX PTCH: 1.0000 | MEDICATED_PATCH | CUTANEOUS | 0 refills | Status: AC

## 2021-03-08 MED ORDER — CELECOXIB 200 MG PO CAPS: 200.0000 mg | ORAL_CAPSULE | Freq: Two times a day (BID) | ORAL | 0 refills | Status: AC

## 2021-03-08 MED ORDER — SENNOSIDES-DOCUSATE SODIUM 8.6-50 MG PO TABS: 1.0000 | ORAL_TABLET | Freq: Every day | ORAL | 0 refills | Status: DC

## 2021-03-08 MED ORDER — HYDROMORPHONE 1 MG/ML IV SOLN: 25.0000 mg | INTRAVENOUS | 0 refills | Status: AC

## 2021-03-08 MED ORDER — BUDESONIDE 3 MG PO CPEP: 3.0000 mg | ORAL_CAPSULE | Freq: Every day | ORAL | 0 refills | Status: DC

## 2021-03-08 MED ORDER — ALPRAZOLAM 0.5 MG PO TABS: 0.5000 mg | ORAL_TABLET | Freq: Two times a day (BID) | ORAL | 0 refills | Status: AC

## 2021-03-08 MED ORDER — METHADONE HCL 10 MG PO TABS: 10.0000 mg | ORAL_TABLET | Freq: Three times a day (TID) | ORAL | 0 refills | Status: DC

## 2021-03-08 MED ORDER — LIDOCAINE 5 % EX PTCH: 1.0000 | MEDICATED_PATCH | CUTANEOUS | 0 refills | Status: DC

## 2021-03-08 MED ORDER — AZELASTINE HCL 0.1 % NA SOLN: 2.0000 | Freq: Two times a day (BID) | NASAL | 12 refills | Status: AC

## 2021-03-08 MED ORDER — ALUM & MAG HYDROXIDE-SIMETH 200-200-20 MG/5ML PO SUSP: 30.0000 mL | ORAL | 0 refills | Status: AC | PRN

## 2021-03-08 MED ORDER — CELECOXIB 200 MG PO CAPS: 200.0000 mg | ORAL_CAPSULE | Freq: Two times a day (BID) | ORAL | 0 refills | Status: DC

## 2021-03-08 MED ORDER — OLANZAPINE 2.5 MG PO TABS: 2.5000 mg | ORAL_TABLET | Freq: Every day | ORAL | 0 refills | Status: AC

## 2021-03-08 MED ORDER — METHADONE HCL 10 MG PO TABS: 10.0000 mg | ORAL_TABLET | Freq: Three times a day (TID) | ORAL | 0 refills | Status: AC

## 2021-03-08 MED ORDER — OLANZAPINE 2.5 MG PO TABS: 2.5000 mg | ORAL_TABLET | Freq: Every day | ORAL | 0 refills | Status: DC

## 2021-03-08 NOTE — Discharge Summary (Addendum)
Physician Discharge Summary  Vicki [name]:[id number] DOB: December 03, 1955 DOA: 02/24/2021  PCP: Vicki Minium, MD  Admit date: 02/24/2021 Discharge date: 03/08/2021  Admitted From: home Discharge disposition: home with hospice   Code Status: DNR  Diet Recommendation: Diet as tolerated  Discharge Diagnosis:   Principal Problem:   Cancer related pain Active Problems:   Hyperlipidemia   Essential hypertension   Coronary artery disease involving native coronary artery of native heart without angina pectoris   Pancreatic cancer ([place])   Hypokalemia due to excessive gastrointestinal loss of potassium   Hypomagnesemia   Constipation   Chronic obstructive pulmonary disease ([place])   Palliative care by specialist  Chief Complaint  Patient presents with  . Abdominal Pain   Brief narrative: Vicki [name] is a 65 y.o. female with PMH significant for pancreatic cancer with biliary stent on gemcitabine/Abraxane(last received gemcitabine on 3/18), COPD, chronic cancer-related painon chronic opiates, collagenous colitis, hypertension, hyperlipidemia, MI and cardiogenic shock. Patient presented to the ED on 3/28 with complaint of nausea, vomiting and severe abdominal pain. CT abdomen and pelvis showed progression of pancreatic cancer and nodal metastatic disease with small volume ascites and anasarca.  Biliary stent did not appear obstructed. At home patient was on morphine and oxycodone for pain but it was not adequately controlled.  She was admitted to hospitalist service. Alleging palliative care services were consulted.  Her hospital course was prolonged because of the need of high-dose of pain medicine.  She was placed on fentanyl drip but because of national shortage of fentanyl, she could not be discharged on the same.  Over the next few days, she was transitioned to Dilaudid PCA pump.  She is being discharged to home today on Dilaudid pump.   Subjective: Patient was seen and  examined this morning. Sitting up in bed.  Not in distress.  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   -Currently patient is getting pain management with methadone 10 mg 3 times daily, Dilaudid PCA, Lidoderm patch, low-dose Zyprexa, simethicone and Decadron. -Currently tolerating on soft/regular diet  Collagenous colitis -Patient is having episodes of diarrhea and deep-seated abdominal pain which probably could be related to a flareup of collagenous colitis.  4/7, bedside was resumed.  Patient still has diarrhea but reports overall improvement in abdominal pain.  Hypokalemia/hypomagnesemia -Improving with replacement. Recent Labs  Lab 03/02/21 0541 03/03/21 0549 03/04/21 0532 03/05/21 0612  K 3.6 3.9 3.6 3.7  MG 2.1 2.0 1.9 1.6*   COPD -Continue duo nebs as needed.   CAD, MI, history of cardiogenic shock Hyperlipidemia -Continue aspirin.  Statin on hold   Wound care:    Discharge Exam:   Vitals:   03/08/21 0759 03/08/21 1220 03/08/21 1440 03/08/21 1612  BP:   121/78   Pulse:   97   Resp: 17 18 16 18   Temp:   97.6 F (36.4 C)   TempSrc:   Oral   SpO2:  98% 98%   Weight:      Height:        Body mass index is 18.18 kg/m.  General exam: Pleasant elderly female.  Pain controlled Skin: No rashes, lesions or ulcers. HEENT: Atraumatic, normocephalic, no obvious bleeding Lungs: Clear to auscultation bilaterally CVS: Regular rate and rhythm, no murmur GI/Abd soft, mild diffuse tenderness, nondistended, bowel  sound present CNS: Alert, awake, oriented x3 Psychiatry: Mood appropriate Extremities: No pedal edema, no calf tenderness  Follow ups:   Discharge Instructions    Increase activity slowly   Complete by: As directed        Follow-up Information    Vicki Minium, MD Follow up.   Specialty: Family Medicine Contact information: [address] [place] [address] [phone]        Lorretta Harp, MD .   Specialties: Cardiology, Radiology Contact information: [address] [place] [address] [phone]               Recommendations for Outpatient Follow-Up:   1. Follow-up with PCP as an outpatient  Discharge Instructions:  Follow with Primary MD Vicki Minium, MD in 7 days   Get CBC/BMP checked in next visit within 1 week by PCP or SNF MD ( we routinely change or add medications that can affect your baseline labs and fluid status, therefore we recommend that you get the mentioned basic workup next visit with your PCP, your PCP may decide not to get them or add new tests based on their clinical decision)  On your next visit with your PCP, please Get Medicines reviewed and adjusted.  Please request your PCP  to go over all Hospital Tests and Procedure/Radiological results at the follow up, please get all Hospital records sent to your Prim MD by signing hospital release before you go home.  Activity: As tolerated with Full fall precautions use walker/cane & assistance as needed  For Heart failure patients - Check your Weight same time everyday, if you gain over 2 pounds, or you develop in leg swelling, experience more shortness of breath or chest pain, call your Primary MD immediately. Follow Cardiac Low Salt Diet and 1.5 lit/day fluid restriction.  If you have smoked or chewed Tobacco in the last 2 yrs please stop smoking, stop any regular Alcohol  and or any Recreational drug use.  If you experience worsening of your admission symptoms, develop shortness of breath, life threatening emergency, suicidal or homicidal thoughts you must seek medical attention immediately by calling 911 or calling your MD immediately  if symptoms less severe.  You Must read  complete instructions/literature along with all the possible adverse reactions/side effects for all the Medicines you take and that have been prescribed to you. Take any new Medicines after you have completely understood and accpet all the possible adverse reactions/side effects.   Do not drive, operate heavy machinery, perform activities at heights, swimming or participation in water activities or provide baby sitting services if your were admitted for syncope or siezures until you have seen by Primary MD or a Neurologist and advised to do so again.  Do not drive when taking Pain medications.  Do not take more than prescribed Pain, Sleep and Anxiety Medications  Wear Seat belts while driving.   Please note You were cared for by a hospitalist during your hospital stay. If you have any questions about your discharge medications or the care you received while you were in the hospital after you are discharged, you can call the unit and asked to speak with the hospitalist on call if the hospitalist that took care of you is not available. Once you are discharged, your primary care physician will handle any further medical issues. Please note that NO REFILLS for any discharge medications will be authorized once you are discharged, as it is imperative that  you return to your primary care physician (or establish a relationship with a primary care physician if you do not have one) for your aftercare needs so that they can reassess your need for medications and monitor your lab values.    Allergies as of 03/08/2021      Reactions   Chlorhexidine    Do not add, port not accessed   Contrast Media [iodinated Diagnostic Agents] Hives   CT IV contrast- pt states she still has itching even with 4 hour and 13 hour preps 09/06/20   Oxaliplatin    If pt receives Oxaliplatin please inf over 3 hrs.      Medication List    STOP taking these medications   aspirin EC 81 MG tablet   cetirizine 10 MG  tablet Commonly known as: ZYRTEC   clopidogrel 75 MG tablet Commonly known as: PLAVIX   escitalopram 10 MG tablet Commonly known as: LEXAPRO   gabapentin 100 MG capsule Commonly known as: NEURONTIN   methylPREDNISolone 4 MG Tbpk tablet Commonly known as: MEDROL DOSEPAK   metoprolol tartrate 50 MG tablet Commonly known as: LOPRESSOR   morphine 100 MG 12 hr tablet Commonly known as: MS CONTIN   Oxycodone HCl 20 MG Tabs   potassium chloride SA 20 MEQ tablet Commonly known as: KLOR-CON     TAKE these medications   albuterol (2.5 MG/3ML) 0.083% nebulizer solution Commonly known as: PROVENTIL Take 2.5 mg by nebulization every 6 (six) hours as needed for wheezing or shortness of breath.   ProAir RespiClick 332 (90 Base) MCG/ACT Aepb Generic drug: Albuterol Sulfate Inhale 1-2 puffs into the lungs every 6 (six) hours as needed (wheezing/shortness of breath).   ALPRAZolam 0.5 MG tablet Commonly known as: XANAX Take 1 tablet (0.5 mg total) by mouth 2 (two) times daily for 3 days. What changed:   medication strength  See the new instructions.   alum & mag hydroxide-simeth 200-200-20 MG/5ML suspension Commonly known as: MAALOX/MYLANTA Take 30 mLs by mouth every 4 (four) hours as needed for indigestion.   azelastine 0.1 % nasal spray Commonly known as: ASTELIN Place 2 sprays into both nostrils 2 (two) times daily. Use in each nostril as directed   budesonide 3 MG 24 hr capsule Commonly known as: ENTOCORT EC Take 1 capsule (3 mg total) by mouth daily. Start taking on: March 09, 2021   celecoxib 200 MG capsule Commonly known as: CELEBREX Take 1 capsule (200 mg total) by mouth 2 (two) times daily.   Fish Oil 1000 MG Caps Take 1,000 mg by mouth at bedtime.   HYDROmorphone 1 mg/mL injection Commonly known as: DILAUDID Inject 25 mLs (25 mg total) into the vein every 4 (four) hours.   lidocaine 5 % Commonly known as: LIDODERM Place 1 patch onto the skin daily. Remove  & Discard patch within 12 hours or as directed by MD   magic mouthwash Soln Take 5-10 mLs by mouth 4 (four) times daily as needed for mouth pain.   methadone 10 MG tablet Commonly known as: DOLOPHINE Take 1 tablet (10 mg total) by mouth every 8 (eight) hours for 3 days.   montelukast 10 MG tablet Commonly known as: SINGULAIR Take 10 mg by mouth at bedtime.   OLANZapine 2.5 MG tablet Commonly known as: ZYPREXA Take 1 tablet (2.5 mg total) by mouth at bedtime for 5 days.   ondansetron 8 MG tablet Commonly known as: ZOFRAN TAKE 1 TABLET BY MOUTH EVERY 8 HOURS AS NEEDED FOR NAUSEA  OR VOMITING   pantoprazole 40 MG tablet Commonly known as: PROTONIX TAKE 1 TABLET(40 MG) BY MOUTH TWICE DAILY What changed: See the new instructions.   polyethylene glycol 17 g packet Commonly known as: MIRALAX / GLYCOLAX Take 17 g by mouth daily as needed for mild constipation or moderate constipation.   rosuvastatin 10 MG tablet Commonly known as: CRESTOR TAKE 1 TABLET(10 MG) BY MOUTH DAILY What changed: See the new instructions.   senna-docusate 8.6-50 MG tablet Commonly known as: Senokot-S Take 1 tablet by mouth daily. Start taking on: March 09, 2021   simethicone 80 MG chewable tablet Commonly known as: MYLICON Chew 1 tablet (80 mg total) by mouth 4 (four) times daily for 3 days.       Time coordinating discharge: 35 minutes  The results of significant diagnostics from this hospitalization (including imaging, microbiology, ancillary and laboratory) are listed below for reference.    Procedures and Diagnostic Studies:   CT Abdomen Pelvis Wo Contrast  Result Date: 02/24/2021 CLINICAL DATA:  Abdominal distension, back and abdominal pain, pancreatic cancer EXAM: CT ABDOMEN AND PELVIS WITHOUT CONTRAST TECHNIQUE: Multidetector CT imaging of the abdomen and pelvis was performed following the standard protocol without IV contrast. COMPARISON:  11/14/2020 FINDINGS: Lower chest: No acute  abnormality. Hepatobiliary: No solid liver abnormality is seen. No gallstones or gallbladder wall thickening. Redemonstrated common bile duct stent with post stenting pneumobilia. Pancreas: Although ill-defined and difficult to clearly evaluate, particularly by noncontrast examination, pancreatic head mass appears significantly enlarged, overall dimensions of the pancreatic head approximately 4.4 x 4.3 cm, previously 3.1 x 2.9 cm when measured similarly (series 2, image 25). Spleen: Normal in size without significant abnormality. Adrenals/Urinary Tract: Adrenal glands are unremarkable. Kidneys are normal, without renal calculi, solid lesion, or hydronephrosis. Bladder is unremarkable. Stomach/Bowel: Stomach is within normal limits. Appendix appears normal. No evidence of bowel wall thickening, distention, or inflammatory changes. Vascular/Lymphatic: Aortic atherosclerosis. Although indistinct and difficult to precisely measure, enlarged, matted left periaortic lymph nodes appear increased in size compared to prior examination, measuring approximately 3.5 x 2.7 cm, previously 2.4 x 2.1 cm when measured similarly (series 2, image 23). Reproductive: No mass or other significant abnormality. Other: No abdominal wall hernia or abnormality. Anasarca. Small volume ascites. Musculoskeletal: No acute or significant osseous findings. IMPRESSION: 1. Although ill-defined and difficult to clearly evaluate, particularly by noncontrast examination, pancreatic head mass appears significantly enlarged compared to prior examination, consistent with worsened primary pancreatic adenocarcinoma. 2. Although indistinct and difficult to precisely measure, enlarged, matted left periaortic lymph nodes appear increased in size compared to prior examination, concerning for worsened nodal metastatic disease. 3. Redemonstrated common bile duct stent with post stenting pneumobilia. 4. Small volume ascites and anasarca. Aortic Atherosclerosis  (ICD10-I70.0). Electronically Signed   By: Eddie Candle M.D.   On: 02/24/2021 13:46   DG ABD ACUTE 2+V W 1V CHEST  Result Date: 02/24/2021 CLINICAL DATA:  Worsening abdominal pain with nausea and vomiting. History of pancreatic cancer. EXAM: DG ABDOMEN ACUTE WITH 1 VIEW CHEST COMPARISON:  Chest radiographs 01/31/2021.  Abdominal CT 11/14/2020. FINDINGS: Frontal chest radiograph demonstrates mild patient rotation to the right. Right IJ Port-A-Cath appears unchanged at the level of the superior cavoatrial junction. The heart size and mediastinal contours are stable allowing for the rotation. The lungs are clear. There is no pleural effusion or pneumothorax. Metallic biliary stent appears unchanged in position. Associated expected pneumobilia appears less evident. The bowel gas pattern is normal. There is no free intraperitoneal  air. There is stable mild degenerative changes in the spine. IMPRESSION: 1. No acute cardiopulmonary or abdominal process identified. 2. Stable position of the biliary stent. Pneumobilia not well visualized; if concern of biliary obstruction, consider further evaluation with follow-up CT. Electronically Signed   By: Richardean Sale M.D.   On: 02/24/2021 12:12     Labs:   Basic Metabolic Panel: Recent Labs  Lab 03/02/21 0541 03/03/21 0549 03/04/21 0532 03/05/21 0612  NA 137 135 138 138  K 3.6 3.9 3.6 3.7  CL 102 102 105 105  CO2 29 27 27 26   GLUCOSE 92 93 90 88  BUN 6* 7* 9 9  CREATININE 0.37* 0.36* 0.31* 0.56  CALCIUM 8.1* 8.4* 8.4* 8.3*  MG 2.1 2.0 1.9 1.6*   GFR Estimated Creatinine Clearance: 55.8 mL/min (by C-G formula based on SCr of 0.56 mg/dL). Liver Function Tests: No results for input(s): AST, ALT, ALKPHOS, BILITOT, PROT, ALBUMIN in the last 168 hours. No results for input(s): LIPASE, AMYLASE in the last 168 hours. No results for input(s): AMMONIA in the last 168 hours. Coagulation profile No results for input(s): INR, PROTIME in the last 168  hours.  CBC: Recent Labs  Lab 03/03/21 0549 03/05/21 0612  WBC 5.0 6.2  NEUTROABS  --  3.8  HGB 10.1* 9.9*  HCT 31.3* 29.9*  MCV 94.8 95.5  PLT 333 403*   Cardiac Enzymes: No results for input(s): CKTOTAL, CKMB, CKMBINDEX, TROPONINI in the last 168 hours. BNP: Invalid input(s): POCBNP CBG: No results for input(s): GLUCAP in the last 168 hours. D-Dimer No results for input(s): DDIMER in the last 72 hours. Hgb A1c No results for input(s): HGBA1C in the last 72 hours. Lipid Profile No results for input(s): CHOL, HDL, LDLCALC, TRIG, CHOLHDL, LDLDIRECT in the last 72 hours. Thyroid function studies No results for input(s): TSH, T4TOTAL, T3FREE, THYROIDAB in the last 72 hours.  Invalid input(s): FREET3 Anemia work up No results for input(s): VITAMINB12, FOLATE, FERRITIN, TIBC, IRON, RETICCTPCT in the last 72 hours. Microbiology No results found for this or any previous visit (from the past 240 hour(s)).   Signed: Terrilee Croak  [facility] 03/08/2021, 4:36 PM

## 2021-03-08 NOTE — TOC Progression Note (Signed)
Transition of Care [facility]) - Progression Note    Patient Details  Name: Vicki [name] MRN: [id number] Date of Birth: August 12, 1956  [facility] CM/SW Contact  Joaquin Courts, RN Phone Number: 03/08/2021, 1:44 PM  Clinical Narrative:    CM spoke with hospice rep Tharon Aquas who reports plan for PCA to be delivered to home today between 5pm and [place] will call unit with confirmation that pca is in the home, at which point patient can dc.  Patient's daughter plans to transport patient home.  Plan for bolus dose of IV pain medication prior to transport so patient can be comfortable and Tharon Aquas will meet patient at home to set up PCA pump.  Patient to discharge with IV site in place.    Expected Discharge Plan: Home w Hospice Care Barriers to Discharge: Continued Medical Work up  Expected Discharge Plan and Services Expected Discharge Plan: [place]   Discharge Planning Services: CM Consult   Living arrangements for the past 2 months: Single Family Home                           HH Arranged: Disease Management [place] Agency: Hospice and [place] Date [facility] Agency Contacted: 03/03/21 Time HH Agency Contacted: 1200 Representative spoke with at [place]: [place] ([place]) Interventions    Readmission Risk Interventions Readmission Risk Prevention Plan 02/28/2021  Transportation Screening Complete  PCP or Specialist Appt within 3-5 Days Complete  HRI or [place] Complete  Social Work Consult for [place] Planning/Counseling Complete  Palliative Care Screening Complete  Medication Review [employer]) Complete  Some recent data might be hidden

## 2021-03-08 NOTE — Plan of Care (Signed)
AVS given to patient and daughter. RN went through AVS in length and answered all questions. Port left accessed per hospice RN to connect pt to home PCA pump. Daughter to provide transport, Pt taken to car in wheelchair.

## 2021-03-08 NOTE — Progress Notes (Signed)
Daily Progress Note   Patient Name: TAMRE CASS       Date: 03/08/2021 DOB: May 23, 1956  Age: 65 y.o. MRN#: [id number] Attending Physician: Terrilee Croak, MD Primary Care Physician: Midge Minium, MD Admit Date: 02/24/2021  Reason for Consultation/Follow-up: Pain control   Subjective: I saw and examined Ms. Yeatts today.  She was awake and alert and sitting in bed eating breakfast.  She reports pain is currently well controlled.  We discussed that transition was made last night from fentanyl over to Dilaudid for her PCA due to the fact that fentanyl is not available due to [place] shortage.  She has done well with dilaudid overnight.  Query of her PCA reveals that she has used 3.5 mg in the last 12 hours.  She expressed being comfortable with the plan to transition home knowing that she could go with PCA.  Her RN has spoken with daughter this morning who also understands plan to d/c to her house with hospice services and PCA.  I called and discussed with liaison from [facility] and will follow-up later this morning to see if any other information/script is needed to get PCA for discharge.    Length of Stay: 12  Current Medications: Scheduled Meds:  . ALPRAZolam  0.5 mg Oral BID  . aspirin EC  81 mg Oral QHS  . azelastine  2 spray Each Nare BID  . bisacodyl  5 mg Oral Daily  . budesonide  3 mg Oral Daily  . celecoxib  200 mg Oral BID  . dexamethasone  8 mg Oral Daily  . enoxaparin (LOVENOX) injection  40 mg Subcutaneous Q24H  . HYDROmorphone   Intravenous Q4H  . lidocaine  1 patch Transdermal Q24H  . magic mouthwash  15 mL Oral QID  . magnesium oxide  400 mg Oral Daily  . methadone  10 mg Oral Q8H  . OLANZapine  2.5 mg Oral QHS  . ondansetron  4 mg Oral TID  AC  . pantoprazole  40 mg Oral BID  . senna-docusate  1 tablet Oral Daily  . simethicone  80 mg Oral QID    Continuous Infusions:   PRN Meds: ALPRAZolam, alum & mag hydroxide-simeth, ipratropium-albuterol, mineral oil, naloxone **AND** sodium chloride flush, polyethylene glycol, sodium chloride flush  Physical Exam  In bed eating breakfast in no distress Abdomen not distended Regular work of breathing Patient complains of ongoing low back pain and is fairly well controlled with boluses from PCA.  No focal deficits No edema    Vital Signs: BP (!) 153/85 (BP Location: Left Arm)   Pulse 68   Temp 97.7 F (36.5 C) (Oral)   Resp 17   Ht 5\' 6"  (1.676 m)   Wt 51.1 kg   LMP  (LMP Unknown)   SpO2 100%   BMI 18.18 kg/m  SpO2: SpO2: 100 % O2 Device: O2 Device: Room Air O2 Flow Rate: O2 Flow Rate (L/min): 0 L/min  Intake/output summary:   Intake/Output Summary (Last 24 hours) at 03/08/2021 1124 Last data filed at 03/08/2021 0938 Gross per 24 hour  Intake 240 ml  Output --  Net 240 ml   LBM: Last BM Date: 03/07/21 Baseline Weight: Weight: 54.4 kg Most recent weight: Weight: 51.1 kg      Palliative performance scale 40%. Palliative Assessment/Data:    Flowsheet Rows   Flowsheet Row Most Recent Value  Intake Tab   Referral Department Hospitalist  Unit at Time of Referral ER  Palliative Care Primary Diagnosis Cancer  Date Notified 02/24/21  Palliative Care Type New Palliative care  Reason for referral Non-pain Symptom, Pain  Date of Admission 02/24/21  Date first seen by Palliative Care 02/26/21  # of days Palliative referral response time 2 Day(s)  # of days IP prior to Palliative referral 0  Clinical Assessment   Psychosocial & Spiritual Assessment   Palliative Care Outcomes       Patient Active Problem List   Diagnosis Date Noted  . Palliative care by specialist   . Constipation   . Chronic obstructive pulmonary disease ([place])   . Cancer related pain  02/24/2021  . Hypomagnesemia 02/24/2021  . Neck pain 11/18/2020  . Diarrhea 11/18/2020  . Abnormal findings on diagnostic imaging of other specified body structures 11/18/2020  . Port-A-Cath in place 09/12/2020  . Abdominal pain 09/06/2020  . Obstruction of biliary stent 09/06/2020  . Genetic testing 04/03/2020  . Chemotherapy induced nausea and vomiting 04/01/2020  . Hypokalemia due to excessive gastrointestinal loss of potassium 04/01/2020  . Pancreatic cancer ([place]) 03/20/2020  . Goals of care, counseling/discussion 03/20/2020  . Family history of breast cancer   . Family history of cystic fibrosis   . Foot pain, right 11/25/2018  . Collagenous colitis 02/08/2018  . Family history of Alzheimer's disease 02/08/2018  . Psoriasis 02/12/2015  . 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 Bradycardia, symptomatic with fatigue 09/13/2013  . Low back pain 02/23/2013  . Coronary artery disease involving native coronary artery of native heart without angina pectoris 05/30/2012  . Physical exam 05/04/2012  . Allergic rhinitis, seasonal 03/25/2012  . Hyperlipidemia 06/19/2010  . Essential hypertension 06/19/2010  . MYOCARDIAL INFARCTION, HX OF 06/19/2010    Palliative Care Assessment & Plan   Patient Profile:    Assessment: 65 year old lady with pancreatic cancer, biliary stent, on chemotherapy recently, also with history of COPD chronic cancer related pain and hypertension dyslipidemia history of MI and collagenous colitis presented with nausea vomiting abdominal pain.  Recommendations/Plan: - DNR/DNI -Overall goal is to work to get home with hospice support.  We will continue to work in conjunction with hospice with plan to get PCA for pain management and discharge. -Cancer pain: Continue current dose of methadone.  Plan for PCA for breakthrough pain at discharge. -Hopefully,  we can coordinate with hospice to get PCA and transition home with hospice support either today  or tomorrow.     Code Status:    Code Status Orders  (From admission, onward)         Start     Ordered   02/24/21 1647  Do not attempt resuscitation (DNR)  Continuous       Question Answer Comment  In the event of cardiac or respiratory ARREST Do not call a "code blue"   In the event of cardiac or respiratory ARREST Do not perform Intubation, CPR, defibrillation or ACLS   In the event of cardiac or respiratory ARREST Use medication by any route, position, wound care, and other measures to relive pain and suffering. May use oxygen, suction and manual treatment of airway obstruction as needed for comfort.      02/24/21 1646        Code Status History    Date Active Date Inactive Code Status Order ID Comments User Context   09/06/2020 0639 09/08/2020 1609 Full Code [id number]  Rise Patience, MD Inpatient   04/01/2020 0458 04/01/2020 2324 Full Code [id number]  Vernelle Emerald, MD Inpatient   Advance Care Planning Activity    Advance Directive Documentation   Flowsheet Row Most Recent Value  Type of Advance Directive Healthcare Power of Attorney, Living will  Pre-existing out of facility DNR order (yellow form or pink MOST form) --  "MOST" Form in Place? --       Prognosis:   < 6 months  Discharge Planning:  Plan transition to her daughter's house with hospice support through [facility] the [facility]  Care plan was discussed with patient, RN, Dr Pietro Cassis, Dr. Hilma Favors, LCSW  Thank you for allowing the Palliative Medicine Team to assist in the care of this patient.   Time In: 0815 Time Out: 0900 Total Time 45 Prolonged Time Billed  no    Greater than 50%  of this time was spent counseling and coordinating care related to the above assessment and plan.  Micheline Rough, MD  Please contact Palliative Medicine Team phone at [phone] for questions and concerns.  Please note that PMT consult service is only available from 7 AM to 7 PM, please contact primary service  attending for care outside of these hours.

## 2021-03-13 ENCOUNTER — Ambulatory Visit: Payer: Medicare Other | Admitting: Oncology

## 2021-03-13 ENCOUNTER — Other Ambulatory Visit: Payer: Medicare Other

## 2021-03-13 ENCOUNTER — Ambulatory Visit: Payer: Medicare Other

## 2021-03-28 ENCOUNTER — Telehealth: Payer: Self-pay

## 2021-03-28 ENCOUNTER — Inpatient Hospital Stay: Payer: Medicare Other | Attending: Oncology | Admitting: Oncology

## 2021-03-28 NOTE — Telephone Encounter (Addendum)
VM message left for Pt inquiring about missed appointment today. Spoke with Pt's daughter who wasn't aware of the appointment. Message sent to scheduling to reschedule appointment and to call daughter to confirm date and time.

## 2021-04-02 ENCOUNTER — Ambulatory Visit: Payer: Medicare Other | Admitting: Oncology

## 2021-04-03 ENCOUNTER — Ambulatory Visit: Payer: Medicare Other | Admitting: Oncology

## 2021-04-03 ENCOUNTER — Other Ambulatory Visit: Payer: Self-pay

## 2021-04-03 ENCOUNTER — Inpatient Hospital Stay: Payer: Medicare Other | Attending: Oncology | Admitting: Oncology

## 2021-04-03 DIAGNOSIS — C257 Malignant neoplasm of other parts of pancreas: Secondary | ICD-10-CM

## 2021-04-03 DIAGNOSIS — C250 Malignant neoplasm of head of pancreas: Secondary | ICD-10-CM

## 2021-04-03 NOTE — Progress Notes (Signed)
[facility] OFFICE VISIT PROGRESS NOTE  I connected with Vicki [name] on 04/03/21 at 11:00 AM EDT by telephone and verified that I am speaking with the correct person using two identifiers.   I discussed the limitations, risks, security and privacy concerns of performing an evaluation and management service by telemedicine and the availability of in-person appointments. I also discussed with the patient that there may be a patient responsible charge related to this service. The patient expressed understanding and agreed to proceed.  Other persons participating in the visit and their role in the encounter: Daughter  Patient's location: Daughter's home Provider's location: Office   Diagnosis: Pancreas cancer  INTERVAL HISTORY:   Vicki [name] was seen today for a telehealth visit per her request.  I spoke with her daughter at approximately 11:30 AM and then Vicki [name] at 2:30 PM.  She was discharged from the hospital on 03/08/2021 after admission with intractable pain.  She is now enrolled in home hospice care via [facility] [place].  She says this is working out well.  She is going to the residential hospice facility this weekend for a respite stay.  Vicki [name] is now living with her daughter.  This is working out well.  She reports her pain is generally under good control with a Dilaudid drip, Dilaudid bolus, and methadone.  Her nausea has improved.  She takes Reglan before meals.  She continues to have hoarseness and plans to follow-up with Dr. Redmond Baseman.    Lab Results:  Lab Results  Component Value Date   WBC 6.2 03/05/2021   HGB 9.9 (L) 03/05/2021   HCT 29.9 (L) 03/05/2021   MCV 95.5 03/05/2021   PLT 403 (H) 03/05/2021   NEUTROABS 3.8 03/05/2021    Medications: I have reviewed the patient's current medications.  Assessment/Plan: 1. 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 MRI abdomen 02/01/2020-2.2  x 3.3 cm mass in the posterior pancreas head/uncinate process, mild intrahepatic/extrahepatic ductal dilatation, no evidence of vascular invasion, possible small lymph nodes in the porta hepatis-poorly visualized ? EUS 02/29/2020-20 x 23 mm mass in the pancreas head, upstream pancreatic duct dilatation, 1 abnormal peripancreatic node, uT3?uN1 ? 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 CTs 03/19/2020-2.9 x 2.1 x 3.1 cm pancreas head/uncinate mass, lesion associated with infrarenal abdominal aorta and superior mesenteric artery, prominent and borderline enlarged retroperitoneal nodes including a left periaortic node, indeterminate 4 mm lung nodule ? Cycle 1 mFOLFIRINOX 12/18/12, complicated by n/v and hospital admission 5/2/ - 5/3 ? Second opinion at [facility] with Dr. Earnestine Mealing and Dr. Mariah Milling 04/02/20 ? PET scan 04/03/2020-hypermetabolic poorly marginated pancreatic head mass; 2 hypermetabolic left periaortic lymph nodes, hypermetabolic left supraclavicular lymph node ? Cycle 2 FOLFIRINOX 04/11/2020 ? Ultrasound-guided biopsy of a left supraclavicular mass on 04/18/2020-poorly differentiated adenocarcinoma, cytokeratin 7+, cytokeratin 20 and CDX2 positive in rare cells. ? Cycle 3 FOLFIRINOX 04/24/2020-oxaliplatin infusion lengthened to 3 hours, Udenyca was not given (ordered) ? Cycle 4 FOLFIRINOX 05/08/2020 ? Cycle 5 FOLFIRINOX 05/22/2020-atropine and Ativan premedication added secondary to cholinergic symptoms and nausea from irinotecan ? CTs 05/29/2020-decreased left supraclavicular lymph node, slight decrease in size of the pancreas head mass, decrease in retroperitoneal lymphadenopathy ? Cycle 6 FOLFIRINOX 06/05/2020 ? Cycle 7 FOLFIRINOX 06/26/2020 ? Cycle 8 FOLFIRINOX 07/11/2020 (oxaliplatin held secondary to neuropathy) ? Cycle 9 FOLFIRINOX 07/24/2020 (oxaliplatin held secondary to neuropathy) ? Cycle 10 FOLFIRINOX 08/07/2020 (oxaliplatin held secondary to  neuropathy) ? CTs  08/19/2020-subtle 5 mm peripheral right liver lesion, pancreas mass unchanged and difficult to discretely delineate, unchanged left retroperitoneal node, no evidence of disease progression, onDr. Lanie Schelling'sreview of the CTs there is no change in the previously enlarged left supraclavicular node ? Cycle 11 FOLFIRINOX 09/18/2020 (oxaliplatin held secondary to neuropathy) ? Cycle 12 FOLFIRINOX 10/08/2020 (oxaliplatin held secondary to neuropathy) ? Cycle 13 FOLFIRINOX 10/30/2020 (oxaliplatin held secondary to neuropathy) ? CTs 11/14/2020-no evidence of thoracic metastases, slight enlargement of the pancreas head mass, increase in size of periaortic lymph node ? Cycle 14 FOLFIRINOX 11/25/2020 (oxaliplatin held secondary to neuropathy) ? Cycle 1 gemcitabine/Abraxane 01/08/2021 ? Cycle 2 gemcitabine/Abraxane 01/30/2021 ? Cycle 3 gemcitabine 02/14/2021, Abraxane held due to progressive neuropathy ? CT 02/24/2021-enlargement of pancreas head mass, enlargement of periaortic lymph nodes  2. Abdomen/back pain secondary to #1-progressive January 2022 3. Anorexia/weight loss secondary to #1 4. History of coronary artery disease,STEMI with the fib arrest 2011, status post RCA stent 5. Constipation, secondary to #1 and morphine 6. 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 Metal stent placed into the common bile duct 09/06/2020. 9.Hoarsenessstatus post vocal cord injections2/24/2022   Disposition: Vicki [name] has metastatic pancreas cancer.  She is currently enrolled in home hospice care via [facility] [place].  Her pain appears adequately controlled with the current narcotic regimen.  She  plans to continue follow-up with the home hospice RN and [facility] the [facility].  She is not scheduled for a follow-up appointment at the Cancer center.  I am available to see her in the future as needed.  She will contact us as needed.   I discussed the assessment and treatment plan with the patient. The patient was provided an opportunity to ask questions and all were answered. The patient agreed with the plan and demonstrated an understanding of the instructions.   The patient was advised to call back or seek an in-person evaluation if the symptoms worsen or if the condition fails to improve as anticipated.  I provided 20 minutes of telephone, chart review, and documentation time during this encounter, and > 50% was spent counseling as documented under my assessment & plan.  Betsy Coder ANP/GNP-BC   04/03/2021 11:36 AM

## 2021-04-08 ENCOUNTER — Other Ambulatory Visit: Payer: Self-pay

## 2021-04-08 ENCOUNTER — Telehealth: Payer: Self-pay | Admitting: Family Medicine

## 2021-04-08 MED ORDER — ALBUTEROL SULFATE HFA 108 (90 BASE) MCG/ACT IN AERS: 2.0000 | INHALATION_SPRAY | Freq: Four times a day (QID) | RESPIRATORY_TRACT | 3 refills | Status: AC | PRN

## 2021-04-08 NOTE — Telephone Encounter (Signed)
Pt called in stating that the pharmacy needs a provider of to release the script of the Proair respiclick. Pt uses [facility] at [place].  She also wanted to make Dr. Birdie Riddle aware that she is now doing hospice care.  Please advise pt can be reached at the home #

## 2021-04-08 NOTE — Telephone Encounter (Signed)
Ok to send Proair to pharmacy for patient w/ 3 refills

## 2021-04-08 NOTE — Telephone Encounter (Signed)
Medication sent.

## 2021-04-15 ENCOUNTER — Telehealth: Payer: Self-pay | Admitting: Family Medicine

## 2021-04-30 NOTE — Telephone Encounter (Signed)
Misty from Hospice called to make Dr. Birdie Riddle aware that Vicki [name] passed away this morning at 10:38am.

## 2021-04-30 NOTE — Telephone Encounter (Signed)
I will get a card in the mail to her family

## 2021-04-30 DEATH — deceased

## 2022-02-27 IMAGING — MR MR ABDOMEN WO/W CM MRCP
12 of 20 series · 25 of 48 positions shown · IV contrast (15ml Multihance)
Comparison: Abdominal ultrasound dated 01/31/2020

CLINICAL DATA: Abdominal pain x6 months, enlarged pancreatic duct
on ultrasound

EXAM:
MRI ABDOMEN WITHOUT AND WITH CONTRAST (INCLUDING MRCP)
TECHNIQUE: Multiplanar multisequence MR imaging of the abdomen was performed
both before and after the administration of intravenous contrast.
Heavily T2-weighted images of the biliary and pancreatic ducts were
obtained, and three-dimensional MRCP images were rendered by post
processing.
CONTRAST:  15mL MULTIHANCE GADOBENATE DIMEGLUMINE 529 MG/ML IV SOLN
Creatinine was obtained on site at [HOSPITAL] at [HOSPITAL].
Results: Creatinine 0.8 mg/dL.

[Series 3: cor haste · coronal · 5.0mm · 0.74mm/px · 2 of 34 slices shown]
[im 1/34]
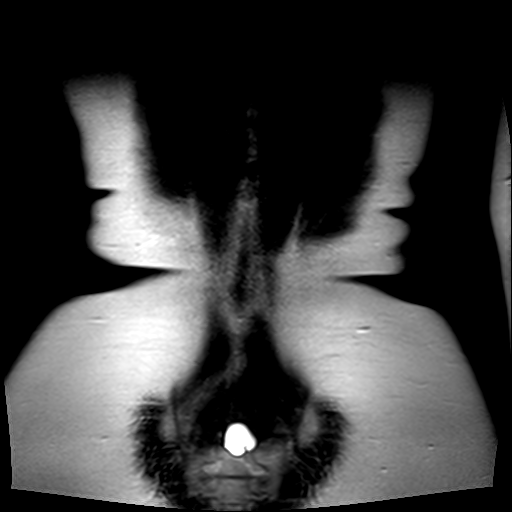
[im 34/34]
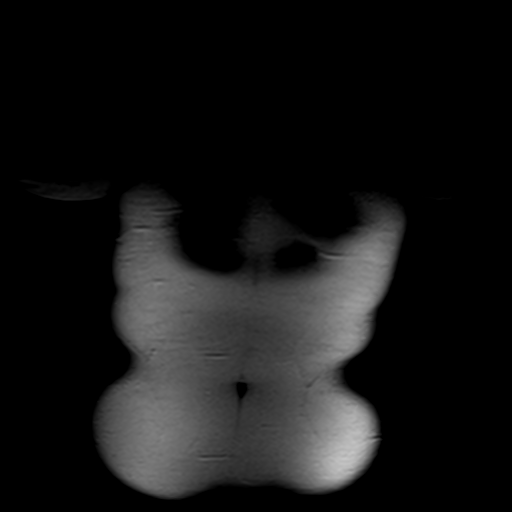

[Series 4: bSSFP · coronal · 5.0mm · 0.78mm/px · 1 of 30 slices shown (1 of 2)]
[im 1/30]
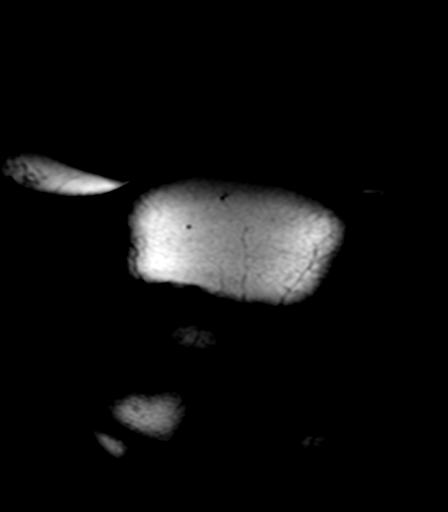

[Series 5: T2 · coronal · 3.0mm · 0.70mm/px · 2 of 48 slices shown (1 of 2)]
[im 1/48]
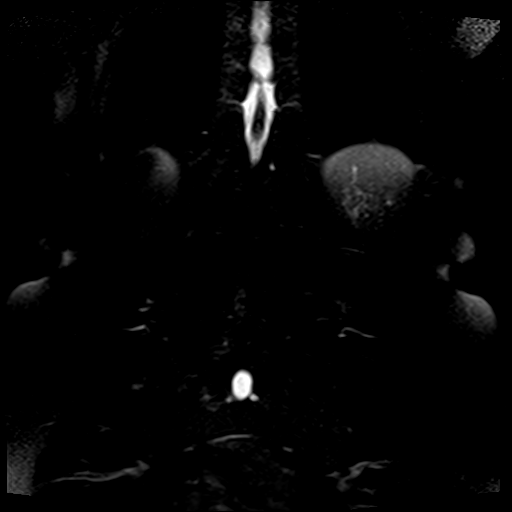
[im 48/48]
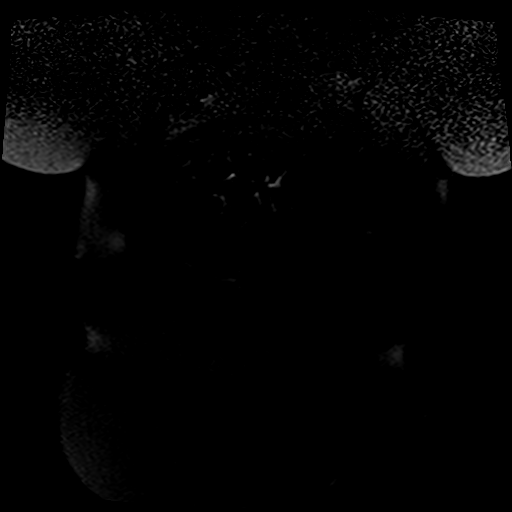

[Series 8: T1 · axial · 6.0mm · 0.74mm/px · z∈[-19,+205]mm · 2 of 70 slices shown]
[im 1/70]
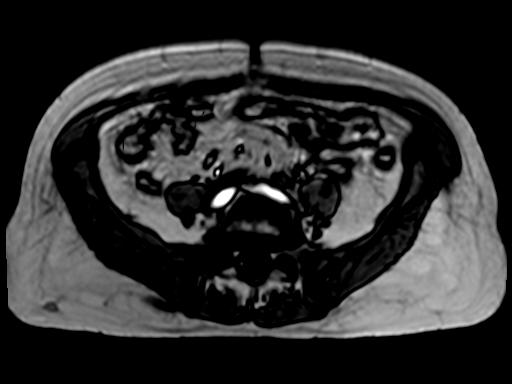
[im 70/70]
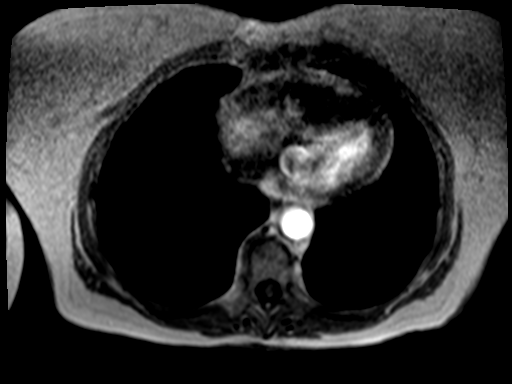

[Series 9: axial haste · axial · 6.0mm · 0.74mm/px · 1 of 34 slices shown]
[im 1/34]
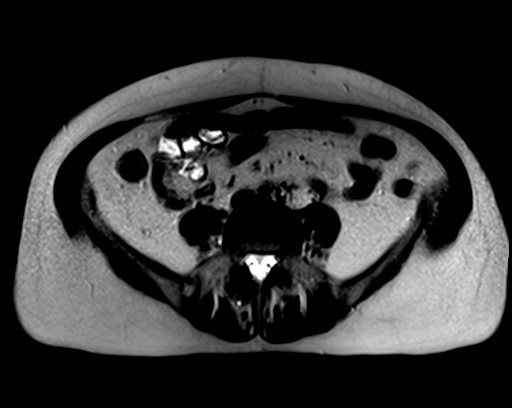

[Series 10: bSSFP · axial · 5.0mm · 0.74mm/px · z∈[+4,+224]mm · 2 of 45 slices shown (2 of 2)]
[im 1/45]
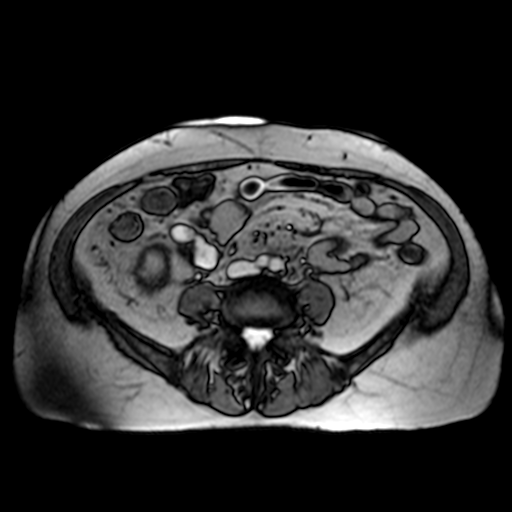
[im 45/45]
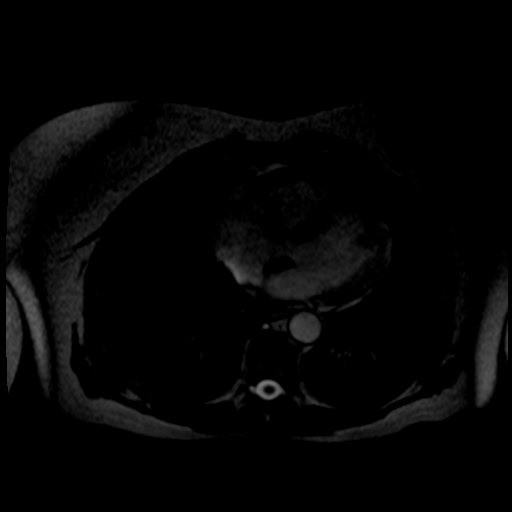

[Series 11: T2 · axial · 6.0mm · 1.12mm/px · 1 of 35 slices shown (2 of 2)]
[im 1/35]
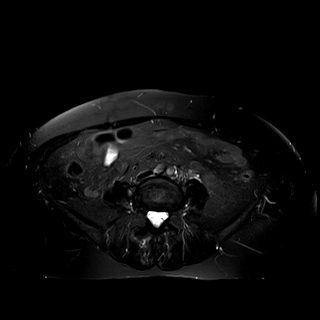

[Series 15: [id number]_trig · axial · 6.0mm · 1.98mm/px · z∈[-1,+244]mm · 4 of 104 slices shown]
[im 1/104]
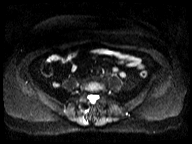
[im 35/104]
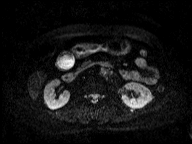
[im 69/104]
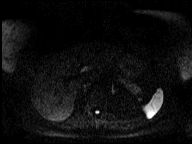
[im 104/104]
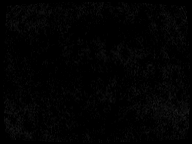

[Series 16: [id number]_trig_adc · axial · 6.0mm · 1.98mm/px · 1 of 35 slices shown]
[im 1/35]
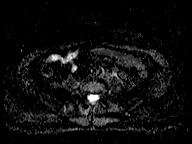

[Series 18: T1 dynamic · axial · non-contrast · 2.5mm · 0.74mm/px · z∈[-7,+211]mm · 3 of 88 slices shown]
[im 1/88]
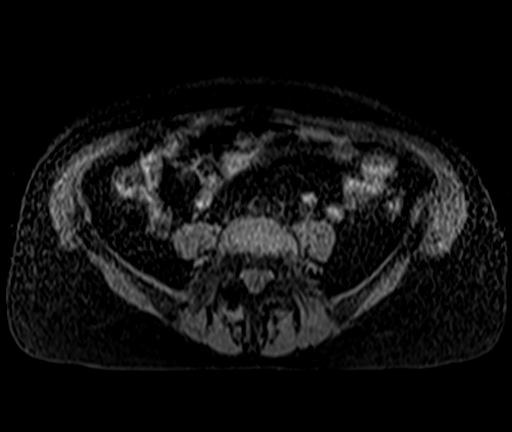
[im 44/88]
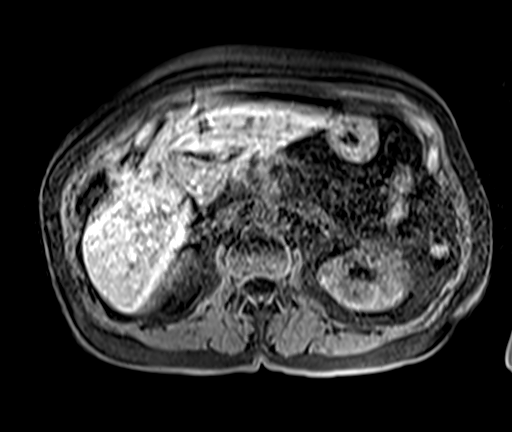
[im 88/88]
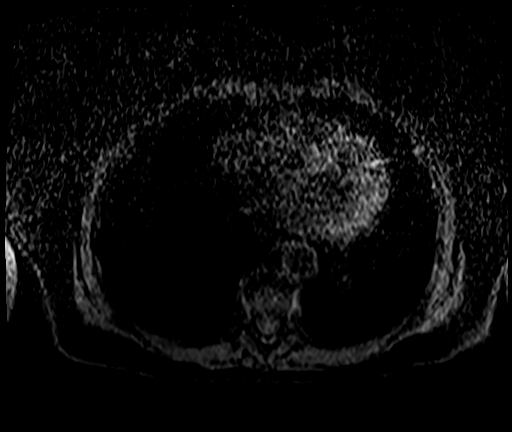

[Series 19: T1 dynamic post-contrast · axial · 2.5mm · 0.74mm/px · z∈[-7,+211]mm · 3 of 88 slices shown (1 of 2)]
[im 1/88]
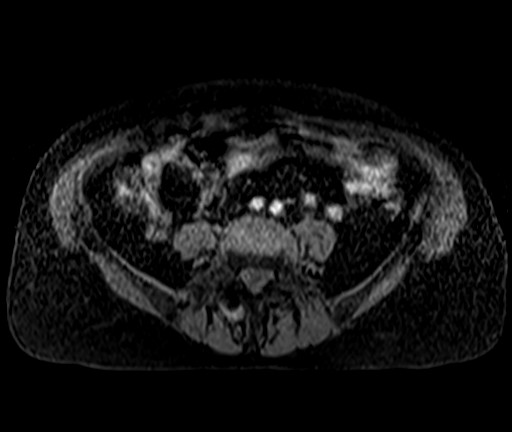
[im 44/88]
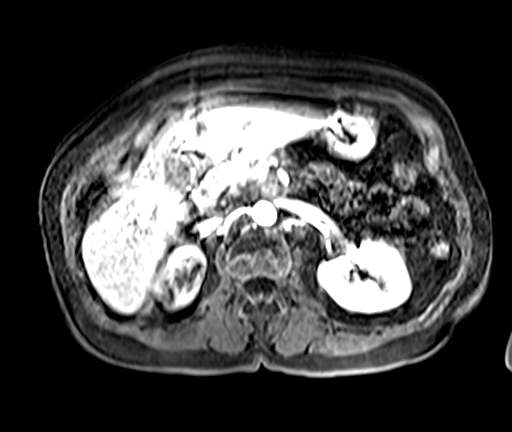
[im 88/88]
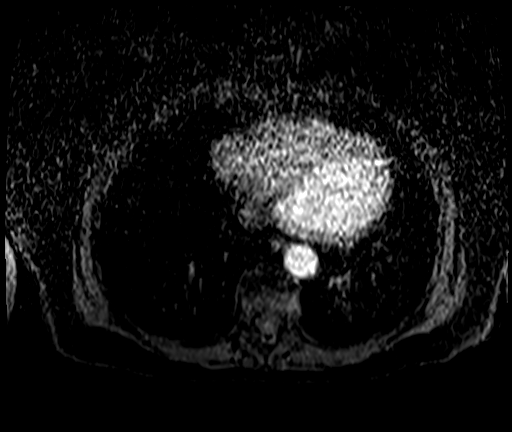

[Series 20: T1 dynamic post-contrast · axial · 2.5mm · 0.74mm/px · z∈[-7,+211]mm · 3 of 88 slices shown (2 of 2)]
[im 1/88]
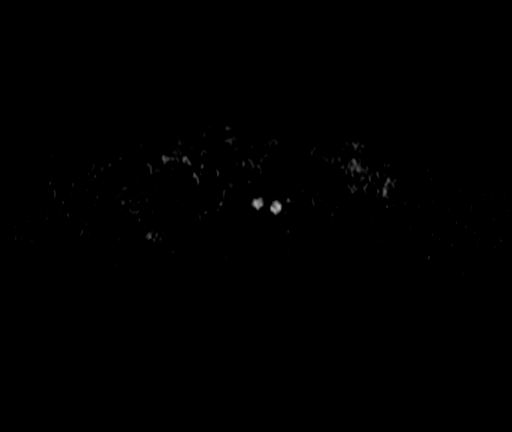
[im 44/88]
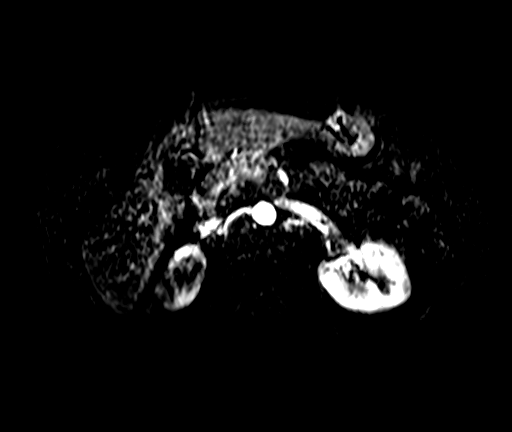
[im 88/88]
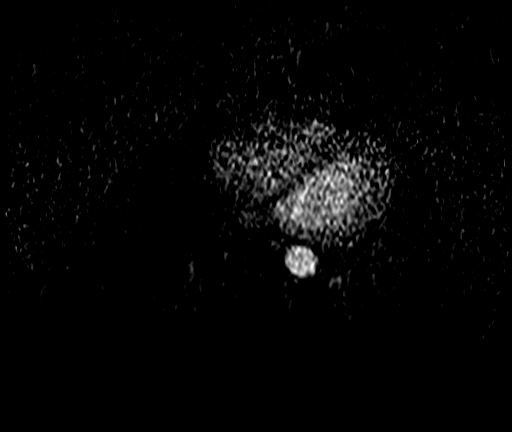

[25 of 48 positions shown; findings below may reference images not displayed]

FINDINGS: Motion degraded images.

Lower chest: Lung bases are clear.

Hepatobiliary: Liver is within normal limits. No
suspicious/enhancing hepatic lesions.

Gallbladder is mildly distended.

Mild intrahepatic/extrahepatic ductal dilatation. Distal common duct
measures 10 mm and abruptly truncates at the level of the pancreatic
head/uncinate process.

Pancreas: 2.2 x 3.3 cm hypoenhancing mass in the posterior
pancreatic head/uncinate process (series 20/image 40), highly
suspicious for primary pancreatic neoplasm such as adenocarcinoma.
Associated dilatation of the pancreatic duct in the pancreatic
body/tail, abruptly truncating at this level. Mild associated
atrophy of the pancreatic tail.

Spleen:  Within normal limits.

Adrenals/Urinary Tract:  Adrenal glands are within normal limits.

Kidneys are within normal limits.  No hydronephrosis.

Stomach/Bowel: Stomach is within normal limits.

Visualized bowel is grossly unremarkable.

Vascular/Lymphatic:  No evidence of abdominal aortic aneurysm.

No evidence of vascular invasion. Pancreatic mass does not involve
the celiac axis, SMA, or SMV. Although postcontrast coronal imaging
is severely motion degraded, the undersurface of the portal vein is
favored to be uninvolved.

Small nodes are suspected in the porta hepatis on diffusion imaging
(series 15/image 51), but are not well visualized on additional
sequences.

Other:  No abdominal ascites.

Musculoskeletal: No focal osseous lesions.
IMPRESSION: 2.2 x 3.3 cm mass in the posterior pancreatic head/uncinate process,
highly suspicious for primary pancreatic neoplasm such as
adenocarcinoma.

Associated mild intrahepatic/extrahepatic ductal dilatation and
dilatation of the main pancreatic duct.

No evidence of vascular invasion.

Small lymph nodes may be present in the porta hepatis but are poorly
visualized on the current MR.

## 2023-09-13 NOTE — Telephone Encounter (Signed)
error
